# Patient Record
Sex: Male | Born: 1975
Health system: Southern US, Community
[De-identification: ages and names within clinical notes are randomized; demographics above are authoritative.]

## PROBLEM LIST (undated history)

## (undated) DIAGNOSIS — F419 Anxiety disorder, unspecified: Secondary | ICD-10-CM

## (undated) DIAGNOSIS — F319 Bipolar disorder, unspecified: Secondary | ICD-10-CM

## (undated) DIAGNOSIS — E119 Type 2 diabetes mellitus without complications: Secondary | ICD-10-CM

## (undated) DIAGNOSIS — L0390 Cellulitis, unspecified: Secondary | ICD-10-CM

## (undated) DIAGNOSIS — I10 Essential (primary) hypertension: Secondary | ICD-10-CM

## (undated) DIAGNOSIS — E785 Hyperlipidemia, unspecified: Secondary | ICD-10-CM

## (undated) HISTORY — DX: Anxiety disorder, unspecified: F41.9

## (undated) HISTORY — PX: OPEN ANTERIOR SHOULDER RECONSTRUCTION: SHX2100

## (undated) HISTORY — PX: EYE SURGERY: SHX253

## (undated) HISTORY — DX: Essential (primary) hypertension: I10

## (undated) HISTORY — PX: FOOT FRACTURE SURGERY: SHX645

## (undated) HISTORY — DX: Hyperlipidemia, unspecified: E78.5

---
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
## 1998-04-24 ENCOUNTER — Emergency Department (HOSPITAL_COMMUNITY): Admission: EM | Admit: 1998-04-24 | Discharge: 1998-04-24 | Payer: Self-pay | Admitting: Emergency Medicine

## 2002-04-27 ENCOUNTER — Encounter: Payer: Self-pay | Admitting: *Deleted

## 2002-04-27 ENCOUNTER — Emergency Department (HOSPITAL_COMMUNITY): Admission: EM | Admit: 2002-04-27 | Discharge: 2002-04-27 | Payer: Self-pay | Admitting: *Deleted

## 2004-06-16 ENCOUNTER — Ambulatory Visit (HOSPITAL_COMMUNITY): Admission: RE | Admit: 2004-06-16 | Discharge: 2004-06-16 | Payer: Self-pay | Admitting: Family Medicine

## 2005-03-12 ENCOUNTER — Ambulatory Visit (HOSPITAL_COMMUNITY): Admission: RE | Admit: 2005-03-12 | Discharge: 2005-03-12 | Payer: Self-pay | Admitting: Orthopedic Surgery

## 2007-03-28 ENCOUNTER — Emergency Department (HOSPITAL_COMMUNITY): Admission: EM | Admit: 2007-03-28 | Discharge: 2007-03-28 | Payer: Self-pay | Admitting: Emergency Medicine

## 2007-04-02 ENCOUNTER — Emergency Department (HOSPITAL_COMMUNITY): Admission: EM | Admit: 2007-04-02 | Discharge: 2007-04-02 | Payer: Self-pay | Admitting: Emergency Medicine

## 2007-08-06 ENCOUNTER — Encounter: Admission: RE | Admit: 2007-08-06 | Discharge: 2007-08-06 | Payer: Self-pay | Admitting: Orthopedic Surgery

## 2007-08-25 ENCOUNTER — Ambulatory Visit (HOSPITAL_BASED_OUTPATIENT_CLINIC_OR_DEPARTMENT_OTHER): Admission: RE | Admit: 2007-08-25 | Discharge: 2007-08-25 | Payer: Self-pay | Admitting: Orthopedic Surgery

## 2009-04-23 ENCOUNTER — Emergency Department (HOSPITAL_COMMUNITY): Admission: EM | Admit: 2009-04-23 | Discharge: 2009-04-23 | Payer: Self-pay | Admitting: Emergency Medicine

## 2010-04-05 ENCOUNTER — Emergency Department (HOSPITAL_COMMUNITY): Admission: EM | Admit: 2010-04-05 | Discharge: 2010-04-05 | Payer: Self-pay | Admitting: Occupational Therapy

## 2010-11-16 ENCOUNTER — Emergency Department (HOSPITAL_COMMUNITY)
Admission: EM | Admit: 2010-11-16 | Discharge: 2010-11-16 | Disposition: A | Discharge status: Home / Self Care | Payer: 59 | Attending: Emergency Medicine | Admitting: Emergency Medicine

## 2010-11-16 DIAGNOSIS — R599 Enlarged lymph nodes, unspecified: Secondary | ICD-10-CM | POA: Insufficient documentation

## 2010-11-16 DIAGNOSIS — H6690 Otitis media, unspecified, unspecified ear: Secondary | ICD-10-CM | POA: Insufficient documentation

## 2010-12-20 LAB — DIFFERENTIAL
Basophils Relative: 1 % (ref 0–1)
Eosinophils Absolute: 0.3 10*3/uL (ref 0.0–0.7)
Eosinophils Relative: 6 % — ABNORMAL HIGH (ref 0–5)
Neutrophils Relative %: 30 % — ABNORMAL LOW (ref 43–77)

## 2010-12-20 LAB — CBC
HCT: 48.3 % (ref 39.0–52.0)
MCHC: 34.4 g/dL (ref 30.0–36.0)
MCV: 92.7 fL (ref 78.0–100.0)
Platelets: 174 10*3/uL (ref 150–400)

## 2010-12-20 LAB — BASIC METABOLIC PANEL
BUN: 9 mg/dL (ref 6–23)
CO2: 26 mEq/L (ref 19–32)
Chloride: 105 mEq/L (ref 96–112)
Creatinine, Ser: 0.65 mg/dL (ref 0.4–1.5)
Glucose, Bld: 81 mg/dL (ref 70–99)

## 2010-12-20 LAB — D-DIMER, QUANTITATIVE: D-Dimer, Quant: 0.24 ug/mL-FEU (ref 0.00–0.48)

## 2011-01-27 NOTE — Op Note (Signed)
NAMEZYIR, MACRAE           ACCOUNT NO.:  [id number]   MEDICAL RECORD NO.:  [id number]          PATIENT TYPE:  AMB   LOCATION:  [place]                          FACILITY:  [facility]   PHYSICIAN:  Weber Cooks, M.D.     DATE OF BIRTH:  1976-09-04   DATE OF PROCEDURE:  08/25/2007  DATE OF DISCHARGE:                               OPERATIVE REPORT   PREOPERATIVE DIAGNOSIS:  1. Post-traumatic left calcaneal cuboid joint arthritis.  2. Left lateral process talus spur.   POSTOPERATIVE DIAGNOSIS:  1. Post-traumatic left calcaneal cuboid joint arthritis.  2. Left lateral process talus spur.   OPERATION:  1. Left calcaneal cuboid fusion.  2. Left local bone graft.  3. Stress x-rays left foot.  4. Partial excision left talus i.e. lateral process talus excision.   ANESTHESIA:  General.   SURGEON:  Weber Cooks, MD.   ASSISTANT:  Lockie Mola, P.A.   ESTIMATED BLOOD LOSS:  Minimal.   TOURNIQUET TIME:  Approximately an hour and half   COMPLICATIONS:  None.   DISPOSITION:  Stable to PR.   INDICATIONS:  This 35 year old male who sustained a fracture of his  cuboid as well as the lateral process of his talus. Postoperatively, he  had persistent pain and progressive pain to the point where he was  interfering with his life. Despite anti-inflammatories and shoe  modifications, he was consented for the above procedure.  All risks  which include infection. neurovessel injury, nonunion, malunion,  hardware rotation, hardware failure, persistent pain, worsening pain,  prolonged recovery, stiffness, and arthritis of neighboring joints,  specifically subtalar joint and the fact that he needed to abstain from  smoking or else he will develop a nonunion were all explained, questions  were encouraged and answered.   DESCRIPTION OF PROCEDURE:  The patient was brought to the operating  room, placed in supine position. Initially after adequate general  endotracheal anesthesia was administered  with block as well as Ancef 1  gram IV piggyback, the patient was then placed in the sloppy lateral  position with the operative side up on a beanbag.  All bony prominences  were well padded, the left lower extremity was then prepped and draped  in a sterile manner over a proximally placed thigh tourniquet. The limb  was gravity exsanguinated and tourniquet was elevated to 290 mmHg.  Am  Ollier incision was then made over the sinus tarsi.  Dissection was  carried down through the skin, hemostasis was obtained.  EDB was then  elevated off the neck and anterior process of the calcaneus and both the  subtalar joint, sinus tarsi area as well as CC joint were entered.  We  first addressed the lateral process of the talus.  This was actually  loose and had fibrous union. Curved quarter inch osteotome, the lateral  process was removed. The entire lateral gutter was freed up and there  was no impingement with subtalar motion.  This was an adequate amount of  bone and it was chopped up and placed on the back table as local bone  graft. We then entered through the CC joint.  This was highly arthritic.  We then removed the remaining cartilage from the CC joint and placed  multiple 2-mm drill holes on either side of rhe joint as well as fish  scale to either side as well.  This created excellent bed for fusion.  We then obtained a synthetic bone graft that was both osteoinductive and  osteoconductive and wrapped the local bone graft within this graft.  This had BMT in it as well.  This was then packed into the calcaneal  cuboid joint.  We then placed an H plate over the lateral aspect of the  calcaneal cuboid joint and then fixed this with four 2.7 mm fully  threaded cortical set screws using a 2.0-mm drill hole respectively.  This had excellent purchase and compression across the CC joint and this  was done with the screws placed in a compressed manner week.  We then  repaired the EDB muscle and then  released the tourniquet and hemostasis  was obtained.  The subcu was closed with 3-0 Vicryl, skin was closed  with 4-0 nylon. A sterile dressing was applied.  Modified Jones dressing  was applied.  The patient was stable to the PR.      Weber Cooks, M.D.  Electronically Signed     PB/MEDQ  D:  08/25/2007  T:  08/26/2007  Job:  [id number]

## 2011-02-10 ENCOUNTER — Inpatient Hospital Stay (HOSPITAL_COMMUNITY)
Admission: RE | Admit: 2011-02-10 | Discharge: 2011-02-13 | DRG: 885 | Disposition: A | Discharge status: Home / Self Care | Payer: 59 | Source: Ambulatory Visit | Attending: Psychiatry | Admitting: Psychiatry

## 2011-02-10 DIAGNOSIS — F259 Schizoaffective disorder, unspecified: Principal | ICD-10-CM | POA: Diagnosis present

## 2011-02-10 DIAGNOSIS — F17200 Nicotine dependence, unspecified, uncomplicated: Secondary | ICD-10-CM | POA: Diagnosis present

## 2011-02-10 LAB — GLUCOSE, CAPILLARY: Glucose-Capillary: 197 mg/dL — ABNORMAL HIGH (ref 70–99)

## 2011-02-11 DIAGNOSIS — F39 Unspecified mood [affective] disorder: Secondary | ICD-10-CM

## 2011-02-11 LAB — CBC
Hemoglobin: 16.1 g/dL (ref 13.0–17.0)
MCH: 31.1 pg (ref 26.0–34.0)
MCHC: 34.8 g/dL (ref 30.0–36.0)
MCV: 89.2 fL (ref 78.0–100.0)
Platelets: 202 10*3/uL (ref 150–400)

## 2011-02-11 LAB — TSH: TSH: 0.962 u[IU]/mL (ref 0.350–4.500)

## 2011-02-11 LAB — LIPID PANEL
Cholesterol: 182 mg/dL (ref 0–200)
LDL Cholesterol: UNDETERMINED mg/dL (ref 0–99)
Total CHOL/HDL Ratio: 7.6 RATIO

## 2011-02-11 LAB — HEMOGLOBIN A1C
Hgb A1c MFr Bld: 8.3 % — ABNORMAL HIGH (ref <5.7)
Mean Plasma Glucose: 192 mg/dL — ABNORMAL HIGH (ref <117)

## 2011-02-11 LAB — GLUCOSE, CAPILLARY: Glucose-Capillary: 123 mg/dL — ABNORMAL HIGH (ref 70–99)

## 2011-02-11 LAB — T4, FREE: Free T4: 1.4 ng/dL (ref 0.80–1.80)

## 2011-02-11 LAB — BILIRUBIN, DIRECT: Bilirubin, Direct: 0.1 mg/dL (ref 0.0–0.3)

## 2011-02-11 LAB — VALPROIC ACID LEVEL: Valproic Acid Lvl: 42.5 ug/mL — ABNORMAL LOW (ref 50.0–100.0)

## 2011-02-11 LAB — COMPREHENSIVE METABOLIC PANEL
AST: 30 U/L (ref 0–37)
BUN: 10 mg/dL (ref 6–23)
CO2: 28 mEq/L (ref 19–32)
Calcium: 9.4 mg/dL (ref 8.4–10.5)
Chloride: 101 mEq/L (ref 96–112)
Creatinine, Ser: 0.82 mg/dL (ref 0.4–1.5)
GFR calc Af Amer: >60 mL/min (ref >60)
GFR calc non Af Amer: >60 mL/min (ref >60)
Total Bilirubin: 0.3 mg/dL (ref 0.3–1.2)

## 2011-02-12 LAB — GLUCOSE, CAPILLARY
Glucose-Capillary: 110 mg/dL — ABNORMAL HIGH (ref 70–99)
Glucose-Capillary: 111 mg/dL — ABNORMAL HIGH (ref 70–99)

## 2011-02-13 DIAGNOSIS — F39 Unspecified mood [affective] disorder: Secondary | ICD-10-CM

## 2011-02-13 LAB — GLUCOSE, CAPILLARY
Glucose-Capillary: 107 mg/dL — ABNORMAL HIGH (ref 70–99)
Glucose-Capillary: 136 mg/dL — ABNORMAL HIGH (ref 70–99)

## 2011-02-13 NOTE — H&P (Signed)
NAME:  MACSON, HINGER NO.:  [id number]  MEDICAL RECORD NO.:  [id number]           PATIENT TYPE:  I  LOCATION:  0505                          FACILITY:  [facility]  PHYSICIAN:  Carloyn Jaeger, MD     DATE OF BIRTH:  08-Jun-1976  DATE OF ADMISSION:  02/10/2011 DATE OF DISCHARGE:                      PSYCHIATRIC ADMISSION ASSESSMENT   CHIEF COMPLAINT:  "I was having thoughts of harming myself."  HISTORY OF PRESENT ILLNESS:  James [name] is a 35 year old, recently separated white male who states that he was admitted after beginning to experience depressive symptoms and some suicidal thoughts.  He states this occurred after his wife of 17 years decided that living with someone with "a bipolar disorder" is too difficult and asked for a separation.  The patient reports that upon admission, he was having difficulty initiating and maintaining sleep with decreased appetite, moderate feelings of sadness, anhedonia and depressed mood.  He reports, however, that now that he is in the hospital and restarted on his medications, his symptoms are under good control.  Currently, he states he is sleeping well without difficulty and reports a good appetite and rates his depression as a "1" on a scale from 1-10 with 1 being no depression.  He adamantly denies any suicidal or homicidal ideations.  The patient also states that prior to admission, he was experiencing some auditory hallucinations but states that these have also resolved. He reports that usually the voices sound to him as if  "people are talking in the background."  Again, he denies any auditory or visual hallucinations or delusional thinking today.  The patient reports smoking cigarettes but denies any use of alcohol or illicit drugs.  The patient states that he does feel ready for discharge but would prefer to remain in the hospital one more day in order to "get his followup appointment straight."  PAST PSYCHIATRIC  HISTORY:  The patient reports being hospitalized at the [facility] for 6 days in 2011.  He denies any other psychiatric hospitalizations.  He also states that he cannot recall the names of other psychiatric medications that he has tried.  PAST MEDICAL HISTORY:  Current medications: 1. Saphris 10 mg p.o. b.i.d. 2. Cogentin 1 mg p.o. b.i.d. 3. Vitamin D 1000 units p.o. daily. 4. Valium 5 mg p.o. b.i.d. 5. Depakote 1000 mg p.o. b.i.d. 6. Fenofibrate 160 mg p.o. daily. 7. NovoLog sliding scale t.i.d. 8. Lamictal 150 mg p.o. b.i.d. 9. Metformin 500 mg p.o. b.i.d.   Allergies:  NKDA.  Medical illnesses:   1. Diabetes mellitus.  Past operations: 1. Right shoulder repair. 2. Surgical repair of left foot.  FAMILY HISTORY:  The patient reports that his maternal grandmother has schizophrenia.  He also states that she likes to "drink alcohol."  He also states that his father has a history of alcohol abuse.  He denies any other family history of psychiatric or substance abuse-related illnesses.  SOCIAL HISTORY:  The patient reports that he was born and raised in [place], [place], and currently lives in [place] with his mother.  He has been married on one occasion for 17 years and has three children, a daughter  62 years of age, a son 20 years of age and a daughter 3 years of age who are all in good health.  The patient states that he is unemployed and receives disability.  As stated in the HPI, he reports recently separating from his wife 3 weeks ago.  He reports smoking 1-2 packs of cigarettes per day but denies any use of alcohol or illicit drugs.  MENTAL STATUS EXAM:  GENERAL - The patient was alert and oriented x3 and was friendly and cooperative throughout the evaluation.  Speech was appropriate in terms of rate and volume.  Mood appeared essentially euthymic.  Affect was slightly constricted. THOUGHTS - The patient denied any obvious delusions or  hallucinations today, nor did he report any suicidal or homicidal ideations.  Judgment and insight today both appeared good.  IMPRESSION:   Axis I: 1. Schizoaffective disorder - Bipolar type - Under good control. 2. Nicotine dependence. Axis II:  Deferred. Axis III:  Please see past medical history above. Axis IV: 1. Recent separation from spouse. 2. Limited primary support system. 3. Unemployment. 4. Serious chronic psychiatric illness. Axis V:  Global Assessment of Functioning at time of admission approximately 40.  Highest Global Assessment of Functioning in past year approximately 92.  PLAN: 1. The patient was continued on all of his psychiatric medications as     prescribed. 2. The patient will continue to be monitored for danger to self and/or     others. 3. The patient will participate in group and unit activities per     routine.    __________________________________ Carloyn Jaeger, MD     RR/MEDQ  D:  02/12/2011  T:  02/12/2011  Job:  [id number]  Electronically Signed by Carloyn Jaeger MD on 02/13/2011 10:04:28 PM

## 2011-02-25 NOTE — Discharge Summary (Signed)
  James [name], James [name]           ACCOUNT NO.:  [id number]  MEDICAL RECORD NO.:  [id number]  LOCATION:                                 FACILITY:  PHYSICIAN:  Carloyn Jaeger, MD     DATE OF BIRTH:  12/13/1975  DATE OF ADMISSION:  02/09/2011 DATE OF DISCHARGE:  02/13/2011                              DISCHARGE SUMMARY   REASON FOR ADMISSION:  The patient was having thoughts of harming himself.  This is a 35 year old male that presented with depressive symptoms and suicidal thoughts.  He was having increasing symptoms after his wife of 17 years asked for a separation.  FINAL IMPRESSION:  AXIS I:  Schizoaffective disorder bipolar type under good control.  Nicotine dependence. AXIS II:  Deferred. AXIS III: History of diabetes mellitus and a past surgery on a right shoulder repair. AXIS IV: Recent separation from spouse, limited primary support system, unemployment, serious chronic psychiatric illness. AXIS V: 55.  PERTINENT LABORATORY DATA:  TSH of 2.758, folate greater than 24.  C-MET within normal limits.  Cholesterol elevated 468, triglycerides elevated at 5650.  PERTINENT FINDINGS:  This is a fully alert and oriented male, friendly, cooperative, admitted to the adult milieu.  The patient was continued on all of his psychiatric medications as prescribed.  We continued to monitor the patient's mood and affect for signs of dangerousness to self and others.  We had contact with the patient's mother to assess safety concerns and to provide information.  No weapons were in the home.  The patient was active in attending groups.  He was improving with sleep, good, appetite good.  His depression was under good control.  Denied any suicidal or homicidal thoughts or auditory hallucinations.  On day of discharge, The patient was anxious to go home.  His sleep was good. Appetite good.  Depression rating it a 1 on a scale of 1-10.  Denied any suicidal or homicidal thoughts, auditory or  visual hallucinations. Anxiety was under good control.  The patient was stable for discharge.  DISCHARGE MEDICATIONS: 1. Saphris 5 mg taking 2 sublingual daily. 2. Cogentin 1 mg b.i.d. 3. Valium 5 mg b.i.d. 4. Depakote ER 500 mg taking 2 b.i.d. 5. Metformin 500 mg b.i.d. with meals. 6. Lamictal 150 mg b.i.d. 7. Tricor 1 daily. 8. Vitamin D2 weekly.  FOLLOWUP:  His follow-up appointment was with Dr. Toy Care on Wednesday 02/18/2011 at 6:15 p.m., phone number [phone].     Redgie Grayer, N.P.   ______________________________ Carloyn Jaeger, MD    JO/MEDQ  D:  02/18/2011  T:  02/18/2011  Job:  [id number]  Electronically Signed by Arnetha CourserP. on 02/25/2011 01:01:00 PM Electronically Signed by Carloyn Jaeger MD on 02/25/2011 04:51:15 PM

## 2011-05-16 ENCOUNTER — Emergency Department (HOSPITAL_BASED_OUTPATIENT_CLINIC_OR_DEPARTMENT_OTHER)
Admission: EM | Admit: 2011-05-16 | Discharge: 2011-05-16 | Disposition: A | Discharge status: Home / Self Care | Payer: 59 | Attending: Emergency Medicine | Admitting: Emergency Medicine

## 2011-05-16 ENCOUNTER — Encounter: Payer: Self-pay | Admitting: Emergency Medicine

## 2011-05-16 DIAGNOSIS — K0381 Cracked tooth: Secondary | ICD-10-CM | POA: Insufficient documentation

## 2011-05-16 DIAGNOSIS — F17200 Nicotine dependence, unspecified, uncomplicated: Secondary | ICD-10-CM | POA: Insufficient documentation

## 2011-05-16 DIAGNOSIS — K089 Disorder of teeth and supporting structures, unspecified: Secondary | ICD-10-CM | POA: Insufficient documentation

## 2011-05-16 DIAGNOSIS — E119 Type 2 diabetes mellitus without complications: Secondary | ICD-10-CM | POA: Insufficient documentation

## 2011-05-16 DIAGNOSIS — K029 Dental caries, unspecified: Secondary | ICD-10-CM | POA: Insufficient documentation

## 2011-05-16 DIAGNOSIS — F319 Bipolar disorder, unspecified: Secondary | ICD-10-CM | POA: Insufficient documentation

## 2011-05-16 DIAGNOSIS — K0889 Other specified disorders of teeth and supporting structures: Secondary | ICD-10-CM

## 2011-05-16 HISTORY — DX: Bipolar disorder, unspecified: F31.9

## 2011-05-16 MED ORDER — PENICILLIN V POTASSIUM 250 MG PO TABS: 250.0000 mg | ORAL_TABLET | Freq: Four times a day (QID) | ORAL | Status: AC

## 2011-05-16 NOTE — ED Notes (Signed)
Pt presents with R lower jaw pain and facial swelling

## 2011-05-16 NOTE — ED Provider Notes (Signed)
History     CSN: [id number] Arrival date & time: 05/16/2011  6:22 PM  Chief Complaint  Patient presents with  . Dental Pain    broken R lower tooth with 10/10 pain   HPI Comments: Pt state that he broke on of his right lower teeth and now he is having a lot of pain in the area  Patient is a 35 y.o. male presenting with tooth pain. The history is provided by the patient. No language interpreter was used.  Dental PainThe primary symptoms include mouth pain and dental injury. Primary symptoms do not include oral bleeding, oral lesions, headaches or fever. The symptoms began 12 to 24 hours ago. The symptoms are unchanged. The symptoms are new.  Additional symptoms include: gum tenderness. Additional symptoms do not include: ear pain.    Past Medical History  Diagnosis Date  . Bipolar 1 disorder   . Diabetes mellitus     Past Surgical History  Procedure Date  . Foot surgery   . Open anterior shoulder reconstruction     History reviewed. No pertinent family history.  History  Substance Use Topics  . Smoking status: Current Some Day Smoker -- 1.0 packs/day  . Smokeless tobacco: Not on file  . Alcohol Use:       Review of Systems  Constitutional: Negative for fever.  HENT: Negative for ear pain.   Respiratory: Negative.   Cardiovascular: Negative.   Neurological: Negative for headaches.    Physical Exam  BP 155/100  Pulse 88  Temp(Src) 98.8 F (37.1 C) (Oral)  Resp 22  SpO2 98%  Physical Exam  Nursing note and vitals reviewed. Constitutional: He appears well-developed and well-nourished.  HENT:  Mouth/Throat: Dental caries present.    Cardiovascular: Normal rate and regular rhythm.   Pulmonary/Chest: Effort normal and breath sounds normal.    ED Course  Procedures  MDM Will treat for possible infection     Glendell Docker, NP 05/16/11 1856

## 2011-05-16 NOTE — ED Provider Notes (Signed)
Medical screening examination/treatment/procedure(s) were performed by non-physician practitioner and as supervising physician I was immediately available for consultation/collaboration.   Veryl Speak, MD 05/16/11 2016

## 2011-06-22 LAB — POCT HEMOGLOBIN-HEMACUE
Hemoglobin: 16.2
Operator id: 112821

## 2011-06-29 LAB — I-STAT 8, (EC8 V) (CONVERTED LAB)
Bicarbonate: 22.8
Glucose, Bld: 112 — ABNORMAL HIGH
Hemoglobin: 15
Operator id: 196461
Sodium: 138
TCO2: 24
pCO2, Ven: 43 — ABNORMAL LOW

## 2011-06-29 LAB — URINALYSIS, ROUTINE W REFLEX MICROSCOPIC
Bilirubin Urine: NEGATIVE
Nitrite: NEGATIVE
Protein, ur: NEGATIVE
Specific Gravity, Urine: 1.031 — ABNORMAL HIGH
Urobilinogen, UA: 0.2

## 2011-06-29 LAB — DIFFERENTIAL
Basophils Absolute: 0
Basophils Relative: 0
Eosinophils Absolute: 0.1
Neutro Abs: 4.6
Neutrophils Relative %: 65

## 2011-06-29 LAB — CBC
MCHC: 34.3
Platelets: 231
RDW: 12.7

## 2011-06-29 LAB — POCT I-STAT CREATININE: Creatinine, Ser: 0.7

## 2011-06-30 LAB — COMPREHENSIVE METABOLIC PANEL WITH GFR
ALT: 32
AST: 31
Albumin: 3.7
Alkaline Phosphatase: 46
BUN: 12
CO2: 23
Calcium: 8.6
Chloride: 104
Creatinine, Ser: 0.85
GFR calc non Af Amer: >60
Glucose, Bld: 168 — ABNORMAL HIGH
Potassium: 3.8
Sodium: 138
Total Bilirubin: 0.6
Total Protein: 6.7

## 2011-06-30 LAB — URINALYSIS, ROUTINE W REFLEX MICROSCOPIC
Bilirubin Urine: NEGATIVE
Glucose, UA: 100 — AB
Hgb urine dipstick: NEGATIVE
Ketones, ur: NEGATIVE
Nitrite: NEGATIVE
Protein, ur: NEGATIVE
Specific Gravity, Urine: 1.019
Urobilinogen, UA: 0.2
pH: 8

## 2011-06-30 LAB — DIFFERENTIAL
Basophils Absolute: 0
Basophils Relative: 1
Lymphocytes Relative: 47 — ABNORMAL HIGH
Monocytes Absolute: 0.6
Neutro Abs: 2.4
Neutrophils Relative %: 41 — ABNORMAL LOW

## 2011-06-30 LAB — CBC
HCT: 39.9
Hemoglobin: 13.8
MCV: 90.2
Platelets: 261
WBC: 5.9

## 2011-06-30 LAB — RAPID URINE DRUG SCREEN, HOSP PERFORMED
Amphetamines: NOT DETECTED
Barbiturates: NOT DETECTED
Opiates: NOT DETECTED
Tetrahydrocannabinol: POSITIVE — AB

## 2011-06-30 LAB — CARBAMAZEPINE LEVEL, TOTAL: Carbamazepine Lvl: 16.4

## 2012-03-16 ENCOUNTER — Emergency Department (HOSPITAL_COMMUNITY)
Admission: EM | Admit: 2012-03-16 | Discharge: 2012-03-16 | Disposition: A | Discharge status: Home / Self Care | Payer: 59 | Attending: Emergency Medicine | Admitting: Emergency Medicine

## 2012-03-16 ENCOUNTER — Encounter (HOSPITAL_COMMUNITY): Payer: Self-pay | Admitting: Emergency Medicine

## 2012-03-16 DIAGNOSIS — R21 Rash and other nonspecific skin eruption: Secondary | ICD-10-CM | POA: Insufficient documentation

## 2012-03-16 DIAGNOSIS — E7800 Pure hypercholesterolemia, unspecified: Secondary | ICD-10-CM | POA: Insufficient documentation

## 2012-03-16 DIAGNOSIS — F17200 Nicotine dependence, unspecified, uncomplicated: Secondary | ICD-10-CM | POA: Insufficient documentation

## 2012-03-16 DIAGNOSIS — Z880 Allergy status to penicillin: Secondary | ICD-10-CM | POA: Insufficient documentation

## 2012-03-16 DIAGNOSIS — E119 Type 2 diabetes mellitus without complications: Secondary | ICD-10-CM | POA: Insufficient documentation

## 2012-03-16 DIAGNOSIS — Z881 Allergy status to other antibiotic agents status: Secondary | ICD-10-CM | POA: Insufficient documentation

## 2012-03-16 DIAGNOSIS — F319 Bipolar disorder, unspecified: Secondary | ICD-10-CM | POA: Insufficient documentation

## 2012-03-16 DIAGNOSIS — W57XXXA Bitten or stung by nonvenomous insect and other nonvenomous arthropods, initial encounter: Secondary | ICD-10-CM

## 2012-03-16 DIAGNOSIS — I10 Essential (primary) hypertension: Secondary | ICD-10-CM | POA: Insufficient documentation

## 2012-03-16 LAB — RAPID URINE DRUG SCREEN, HOSP PERFORMED
Amphetamines: NOT DETECTED
Barbiturates: NOT DETECTED
Benzodiazepines: POSITIVE — AB
Cocaine: NOT DETECTED

## 2012-03-16 LAB — CBC
HCT: 48.9 % (ref 39.0–52.0)
Hemoglobin: 18.4 g/dL — ABNORMAL HIGH (ref 13.0–17.0)
RBC: 5.48 MIL/uL (ref 4.22–5.81)

## 2012-03-16 LAB — ETHANOL: Alcohol, Ethyl (B): <11 mg/dL (ref 0–11)

## 2012-03-16 LAB — COMPREHENSIVE METABOLIC PANEL
CO2: 23 mEq/L (ref 19–32)
Calcium: 9.5 mg/dL (ref 8.4–10.5)
Creatinine, Ser: 0.63 mg/dL (ref 0.50–1.35)
GFR calc Af Amer: >90 mL/min (ref >90)
GFR calc non Af Amer: >90 mL/min (ref >90)
Glucose, Bld: 119 mg/dL — ABNORMAL HIGH (ref 70–99)
Total Protein: 7 g/dL (ref 6.0–8.3)

## 2012-03-16 MED ORDER — HYDROCORTISONE 1 % EX CREA: TOPICAL_CREAM | CUTANEOUS | Status: DC

## 2012-03-16 NOTE — ED Notes (Signed)
Pt is from [facility]. Pt is here for medical clearance. Pt need labs, has rash on leg, and elevated bp

## 2012-03-16 NOTE — ED Provider Notes (Signed)
History     CSN: [id number]  Arrival date & time 03/16/12  [id number]   First MD Initiated Contact with Patient 03/16/12 [phone]     HPI Pt presents to the ED for medical clearance.  He has history of mental health problems on medications.  Pt is now hearing voices that are commanding him to kill his wife.  PT sought treatment to help him feel safe.   No suicidal ideation.  Medically pt has no complaints.  He does have history of htn and hypercholesterolemia.  Has not been taking medications for that because he cannot afford it. He has had a rash on this left knee for years.  It is mildly itchy.  He also has numerous bug bites on his legs.  PT states the symptoms are mild.  No fever or drainage. Past Medical History  Diagnosis Date  . Bipolar 1 disorder   . Diabetes mellitus     Past Surgical History  Procedure Date  . Foot surgery   . Open anterior shoulder reconstruction     No family history on file.  History  Substance Use Topics  . Smoking status: Current Some Day Smoker -- 1.0 packs/day  . Smokeless tobacco: Not on file  . Alcohol Use:       Review of Systems  All other systems reviewed and are negative.    Allergies  Amoxicillin and Penicillins  Home Medications   Current Outpatient Rx  Name Route Sig Dispense Refill  . DIVALPROEX SODIUM ER 500 MG PO TB24 Oral Take 1,000 mg by mouth 2 (two) times daily.      Marland Kitchen SAPHRIS SL Sublingual Place 1 tablet under the tongue daily.      Marland Kitchen BENZTROPINE MESYLATE 1 MG PO TABS Oral Take 1-2 mg by mouth 2 (two) times daily. Take 1 tab in the morning and 2 tabs in the evening     . VITAMIN D 1000 UNITS PO CAPS Oral Take 1,000 Units by mouth daily.      Marland Kitchen METFORMIN HCL PO Oral Take 1 tablet by mouth 2 (two) times daily.      Marland Kitchen PRESCRIPTION MEDICATION Oral Take 1 tablet by mouth daily. Cholesterol medication     . PRESCRIPTION MEDICATION Oral Take 5 mg by mouth daily. Antipsychotic       BP 130/81  Pulse 79  Temp 97.8 F (36.6 C)  (Oral)  Resp 16  SpO2 97%  Physical Exam  Nursing note and vitals reviewed. Constitutional: He appears well-developed and well-nourished. No distress.  HENT:  Head: Normocephalic and atraumatic.  Right Ear: External ear normal.  Left Ear: External ear normal.  Eyes: Conjunctivae are normal. Right eye exhibits no discharge. Left eye exhibits no discharge. No scleral icterus.  Neck: Neck supple. No tracheal deviation present.  Cardiovascular: Normal rate, regular rhythm and intact distal pulses.   Pulmonary/Chest: Effort normal and breath sounds normal. No stridor. No respiratory distress. He has no wheezes. He has no rales.  Abdominal: Soft. Bowel sounds are normal. He exhibits no distension. There is no tenderness. There is no rebound and no guarding.  Musculoskeletal: He exhibits no edema and no tenderness.  Neurological: He is alert. He has normal strength. No sensory deficit. Cranial nerve deficit:  no gross defecits noted. He exhibits normal muscle tone. He displays no seizure activity. Coordination normal.  Skin: Skin is warm and dry. Rash noted.       Macular papular rash on left knee, no pustules  Psychiatric:  He has a normal mood and affect.    ED Course  Procedures (including critical care time)  Labs Reviewed  URINE RAPID DRUG SCREEN (HOSP PERFORMED) - Abnormal; Notable for the following:    Benzodiazepines POSITIVE (*)     All other components within normal limits  COMPREHENSIVE METABOLIC PANEL - Abnormal; Notable for the following:    Glucose, Bld 119 (*)     All other components within normal limits  CBC - Abnormal; Notable for the following:    Hemoglobin 18.4 (*)     MCHC 36.2 (*)  CORRECTED FOR INTERFERING SUBSTANCE   All other components within normal limits  ETHANOL   No results found.    MDM  Pt has history of HTN.  BP only mildly elevated here.  No specific treatment needed.  Follow up with PCP routinely.  Pt has history of hypercholesterolemia.  Safe  to follow up with a PCP.  Cholesterol screening not typically done in the ED.  Regarding his rash, it has been ongoing for years.  May be related to eczema.  Also has insect bites but no sign of erythema marginatum or acute infection.  Medically Stable for psychiatric treatment.       Kathalene Frames, MD 03/16/12 1213

## 2012-03-16 NOTE — ED Notes (Signed)
Pt here from [facility] and being seen there for Hallucinations. He was sent here to have rash looked at that is on his left knee. Also [facility] has asked for basic labs done. Dr Tomi Bamberger is with patient looking at rash.

## 2012-08-24 ENCOUNTER — Emergency Department (HOSPITAL_BASED_OUTPATIENT_CLINIC_OR_DEPARTMENT_OTHER): Payer: 59

## 2012-08-24 ENCOUNTER — Encounter (HOSPITAL_BASED_OUTPATIENT_CLINIC_OR_DEPARTMENT_OTHER): Payer: Self-pay | Admitting: *Deleted

## 2012-08-24 ENCOUNTER — Inpatient Hospital Stay (HOSPITAL_BASED_OUTPATIENT_CLINIC_OR_DEPARTMENT_OTHER)
Admission: EM | Admit: 2012-08-24 | Discharge: 2012-08-28 | DRG: 638 | Disposition: A | Discharge status: Home / Self Care | Payer: 59 | Attending: Internal Medicine | Admitting: Internal Medicine

## 2012-08-24 DIAGNOSIS — D72829 Elevated white blood cell count, unspecified: Secondary | ICD-10-CM | POA: Diagnosis present

## 2012-08-24 DIAGNOSIS — E119 Type 2 diabetes mellitus without complications: Secondary | ICD-10-CM

## 2012-08-24 DIAGNOSIS — IMO0002 Reserved for concepts with insufficient information to code with codable children: Principal | ICD-10-CM | POA: Diagnosis present

## 2012-08-24 DIAGNOSIS — Z6832 Body mass index (BMI) 32.0-32.9, adult: Secondary | ICD-10-CM

## 2012-08-24 DIAGNOSIS — F319 Bipolar disorder, unspecified: Secondary | ICD-10-CM | POA: Diagnosis present

## 2012-08-24 DIAGNOSIS — E1169 Type 2 diabetes mellitus with other specified complication: Principal | ICD-10-CM | POA: Diagnosis present

## 2012-08-24 DIAGNOSIS — E669 Obesity, unspecified: Secondary | ICD-10-CM | POA: Diagnosis present

## 2012-08-24 DIAGNOSIS — Z79899 Other long term (current) drug therapy: Secondary | ICD-10-CM

## 2012-08-24 DIAGNOSIS — R509 Fever, unspecified: Secondary | ICD-10-CM

## 2012-08-24 DIAGNOSIS — N390 Urinary tract infection, site not specified: Secondary | ICD-10-CM

## 2012-08-24 DIAGNOSIS — L02419 Cutaneous abscess of limb, unspecified: Secondary | ICD-10-CM | POA: Diagnosis present

## 2012-08-24 DIAGNOSIS — F17200 Nicotine dependence, unspecified, uncomplicated: Secondary | ICD-10-CM | POA: Diagnosis present

## 2012-08-24 DIAGNOSIS — L0390 Cellulitis, unspecified: Secondary | ICD-10-CM

## 2012-08-24 HISTORY — DX: Cellulitis, unspecified: L03.90

## 2012-08-24 HISTORY — DX: Type 2 diabetes mellitus without complications: E11.9

## 2012-08-24 LAB — URINE MICROSCOPIC-ADD ON

## 2012-08-24 LAB — COMPREHENSIVE METABOLIC PANEL
AST: 16 U/L (ref 0–37)
BUN: 19 mg/dL (ref 6–23)
CO2: 24 mEq/L (ref 19–32)
Calcium: 8.9 mg/dL (ref 8.4–10.5)
Creatinine, Ser: 1.2 mg/dL (ref 0.50–1.35)
GFR calc non Af Amer: 75 mL/min — ABNORMAL LOW (ref >90)

## 2012-08-24 LAB — CBC WITH DIFFERENTIAL/PLATELET
Basophils Absolute: 0 10*3/uL (ref 0.0–0.1)
Basophils Relative: 0 % (ref 0–1)
Eosinophils Relative: 0 % (ref 0–5)
HCT: 42.1 % (ref 39.0–52.0)
Lymphocytes Relative: 6 % — ABNORMAL LOW (ref 12–46)
MCHC: 36.6 g/dL — ABNORMAL HIGH (ref 30.0–36.0)
MCV: 87.2 fL (ref 78.0–100.0)
Monocytes Absolute: 1.1 10*3/uL — ABNORMAL HIGH (ref 0.1–1.0)
Monocytes Relative: 10 % (ref 3–12)
RDW: 12.7 % (ref 11.5–15.5)

## 2012-08-24 LAB — CREATININE, SERUM
Creatinine, Ser: 1.27 mg/dL (ref 0.50–1.35)
GFR calc Af Amer: 81 mL/min — ABNORMAL LOW (ref >90)

## 2012-08-24 LAB — VALPROIC ACID LEVEL: Valproic Acid Lvl: 73.4 ug/mL (ref 50.0–100.0)

## 2012-08-24 LAB — URINALYSIS, ROUTINE W REFLEX MICROSCOPIC
Nitrite: POSITIVE — AB
Specific Gravity, Urine: 1.034 — ABNORMAL HIGH (ref 1.005–1.030)
Urobilinogen, UA: 1 mg/dL (ref 0.0–1.0)
pH: 5 (ref 5.0–8.0)

## 2012-08-24 LAB — CBC
Hemoglobin: 15.3 g/dL (ref 13.0–17.0)
MCH: 32.2 pg (ref 26.0–34.0)
MCV: 89.3 fL (ref 78.0–100.0)
Platelets: 132 10*3/uL — ABNORMAL LOW (ref 150–400)
RBC: 4.75 MIL/uL (ref 4.22–5.81)
WBC: 9.9 10*3/uL (ref 4.0–10.5)

## 2012-08-24 LAB — GLUCOSE, CAPILLARY: Glucose-Capillary: 106 mg/dL — ABNORMAL HIGH (ref 70–99)

## 2012-08-24 MED ORDER — VANCOMYCIN HCL IN DEXTROSE 1-5 GM/200ML-% IV SOLN
1000.0000 mg | Freq: Once | INTRAVENOUS | Status: AC
  Administered 2012-08-24: 1000 mg via INTRAVENOUS
  Filled 2012-08-24: qty 200

## 2012-08-24 MED ORDER — HYDROMORPHONE HCL PF 1 MG/ML IJ SOLN
1.0000 mg | INTRAMUSCULAR | Status: DC | PRN
  Administered 2012-08-25 (×2): 1 mg via INTRAVENOUS
  Filled 2012-08-24 (×2): qty 1

## 2012-08-24 MED ORDER — ENOXAPARIN SODIUM 40 MG/0.4ML ~~LOC~~ SOLN
40.0000 mg | SUBCUTANEOUS | Status: DC
  Administered 2012-08-25 – 2012-08-28 (×4): 40 mg via SUBCUTANEOUS
  Filled 2012-08-24 (×5): qty 0.4

## 2012-08-24 MED ORDER — BENZTROPINE MESYLATE 2 MG PO TABS
2.0000 mg | ORAL_TABLET | Freq: Every day | ORAL | Status: DC
  Administered 2012-08-24 – 2012-08-27 (×4): 2 mg via ORAL
  Filled 2012-08-24 (×5): qty 1

## 2012-08-24 MED ORDER — VANCOMYCIN HCL IN DEXTROSE 1-5 GM/200ML-% IV SOLN
1000.0000 mg | Freq: Two times a day (BID) | INTRAVENOUS | Status: DC
  Administered 2012-08-25 – 2012-08-26 (×4): 1000 mg via INTRAVENOUS
  Filled 2012-08-24 (×4): qty 200

## 2012-08-24 MED ORDER — HYDROMORPHONE HCL PF 1 MG/ML IJ SOLN
INTRAMUSCULAR | Status: AC
  Filled 2012-08-24: qty 1

## 2012-08-24 MED ORDER — ONDANSETRON HCL 4 MG/2ML IJ SOLN
4.0000 mg | Freq: Once | INTRAMUSCULAR | Status: AC
  Administered 2012-08-24: 4 mg via INTRAVENOUS
  Filled 2012-08-24: qty 2

## 2012-08-24 MED ORDER — HYDROMORPHONE HCL PF 1 MG/ML IJ SOLN: 1.0000 mg | INTRAMUSCULAR | Status: DC | PRN

## 2012-08-24 MED ORDER — ACETAMINOPHEN 325 MG PO TABS
650.0000 mg | ORAL_TABLET | Freq: Once | ORAL | Status: AC
  Administered 2012-08-24: 650 mg via ORAL
  Filled 2012-08-24: qty 2

## 2012-08-24 MED ORDER — SODIUM CHLORIDE 0.9 % IV SOLN: INTRAVENOUS | Status: AC

## 2012-08-24 MED ORDER — INSULIN ASPART 100 UNIT/ML ~~LOC~~ SOLN: 0.0000 [IU] | Freq: Every day | SUBCUTANEOUS | Status: DC

## 2012-08-24 MED ORDER — ONDANSETRON HCL 4 MG PO TABS: 4.0000 mg | ORAL_TABLET | Freq: Four times a day (QID) | ORAL | Status: DC | PRN

## 2012-08-24 MED ORDER — ONDANSETRON HCL 4 MG/2ML IJ SOLN: 4.0000 mg | Freq: Three times a day (TID) | INTRAMUSCULAR | Status: DC | PRN

## 2012-08-24 MED ORDER — RISPERIDONE 2 MG PO TABS
4.0000 mg | ORAL_TABLET | Freq: Every day | ORAL | Status: DC
  Administered 2012-08-24 – 2012-08-27 (×4): 4 mg via ORAL
  Filled 2012-08-24 (×5): qty 2

## 2012-08-24 MED ORDER — HYDROXYZINE PAMOATE 50 MG PO CAPS
50.0000 mg | ORAL_CAPSULE | Freq: Three times a day (TID) | ORAL | Status: DC
  Administered 2012-08-24: 50 mg via ORAL
  Filled 2012-08-24 (×4): qty 1

## 2012-08-24 MED ORDER — HYDROMORPHONE HCL PF 1 MG/ML IJ SOLN
1.0000 mg | Freq: Once | INTRAMUSCULAR | Status: AC
  Administered 2012-08-24: 1 mg via INTRAVENOUS

## 2012-08-24 MED ORDER — MORPHINE SULFATE 4 MG/ML IJ SOLN
4.0000 mg | Freq: Once | INTRAMUSCULAR | Status: AC
  Administered 2012-08-24: 4 mg via INTRAVENOUS
  Filled 2012-08-24: qty 1

## 2012-08-24 MED ORDER — BENZTROPINE MESYLATE 1 MG PO TABS
1.0000 mg | ORAL_TABLET | Freq: Every day | ORAL | Status: DC
  Administered 2012-08-25 – 2012-08-28 (×4): 1 mg via ORAL
  Filled 2012-08-24 (×4): qty 1

## 2012-08-24 MED ORDER — ONDANSETRON HCL 4 MG/2ML IJ SOLN: 4.0000 mg | Freq: Four times a day (QID) | INTRAMUSCULAR | Status: DC | PRN

## 2012-08-24 MED ORDER — DEXTROSE 5 % IV SOLN
1.0000 g | Freq: Two times a day (BID) | INTRAVENOUS | Status: DC
  Administered 2012-08-24 – 2012-08-25 (×4): 1 g via INTRAVENOUS
  Filled 2012-08-24 (×5): qty 1

## 2012-08-24 MED ORDER — METFORMIN HCL 500 MG PO TABS
500.0000 mg | ORAL_TABLET | Freq: Two times a day (BID) | ORAL | Status: DC
  Administered 2012-08-25 – 2012-08-28 (×7): 500 mg via ORAL
  Filled 2012-08-24 (×9): qty 1

## 2012-08-24 MED ORDER — DIVALPROEX SODIUM ER 500 MG PO TB24
1000.0000 mg | ORAL_TABLET | Freq: Two times a day (BID) | ORAL | Status: DC
  Administered 2012-08-24 – 2012-08-28 (×8): 1000 mg via ORAL
  Filled 2012-08-24 (×9): qty 2

## 2012-08-24 MED ORDER — CEFEPIME HCL 1 G IJ SOLR
INTRAMUSCULAR | Status: AC
  Filled 2012-08-24: qty 1

## 2012-08-24 MED ORDER — DEXTROSE 5 % IV SOLN: 1.0000 g | Freq: Two times a day (BID) | INTRAVENOUS | Status: DC

## 2012-08-24 MED ORDER — INSULIN ASPART 100 UNIT/ML ~~LOC~~ SOLN: 0.0000 [IU] | Freq: Three times a day (TID) | SUBCUTANEOUS | Status: DC

## 2012-08-24 MED ORDER — VANCOMYCIN HCL 10 G IV SOLR
1500.0000 mg | Freq: Once | INTRAVENOUS | Status: AC
  Administered 2012-08-24: 1500 mg via INTRAVENOUS
  Filled 2012-08-24: qty 1500

## 2012-08-24 MED ORDER — RISPERIDONE 2 MG PO TABS: 4.0000 mg | ORAL_TABLET | Freq: Every day | ORAL | Status: DC

## 2012-08-24 NOTE — ED Provider Notes (Signed)
History     CSN: [id number]  Arrival date & time 08/24/12  1426   First MD Initiated Contact with Patient 08/24/12 1458      Chief Complaint  Patient presents with  . Rash    (Consider location/radiation/quality/duration/timing/severity/associated sxs/prior treatment) HPI Comments: Painful red rash to R leg and arm for the past day.  Started yesterday in upper medial thigh and progressed down leg.  Also area of blotchy redness and pain to R upper arm.  Chills, but denies fever.  Denies tick exposure, camping, injury, cuts.  No chest pain or SOB.  Has nausea.  Denies new medications, foods, products.  The history is provided by the patient and a relative.    Past Medical History  Diagnosis Date  . Bipolar 1 disorder   . Diabetes mellitus     Past Surgical History  Procedure Date  . Foot surgery   . Open anterior shoulder reconstruction     History reviewed. No pertinent family history.  History  Substance Use Topics  . Smoking status: Current Some Day Smoker -- 1.0 packs/day  . Smokeless tobacco: Not on file  . Alcohol Use: No      Review of Systems  Constitutional: Positive for activity change and appetite change. Negative for fever.  HENT: Negative for congestion and rhinorrhea.   Respiratory: Negative for cough, chest tightness and shortness of breath.   Cardiovascular: Negative for chest pain.  Gastrointestinal: Negative for vomiting and abdominal pain.  Genitourinary: Negative for dysuria and penile pain.  Skin: Positive for rash.  Neurological: Positive for weakness and light-headedness. Negative for dizziness and headaches.    Allergies  Amoxicillin and Penicillins  Home Medications   Current Outpatient Rx  Name  Route  Sig  Dispense  Refill  . BENZTROPINE MESYLATE 1 MG PO TABS   Oral   Take 1-2 mg by mouth 2 (two) times daily. Take 1 tab in the morning and 2 tabs in the evening          . DIVALPROEX SODIUM ER 500 MG PO TB24   Oral   Take  1,000 mg by mouth 2 (two) times daily.           Marland Kitchen HYDROCORTISONE 1 % EX CREA      Apply to affected area 2 times daily   15 g   0   . HYDROXYZINE PAMOATE 50 MG PO CAPS   Oral   Take 50 mg by mouth 3 (three) times daily.         Marland Kitchen METFORMIN HCL PO   Oral   Take 1,000 mg by mouth every morning.          Marland Kitchen RISPERIDONE 4 MG PO TABS   Oral   Take 4 mg by mouth at bedtime.           BP 127/73  Pulse 85  Temp 101 F (38.3 C) (Oral)  Resp 20  Ht 6\' 3"  (1.905 m)  Wt 270 lb (122.471 kg)  BMI 33.75 kg/m2  SpO2 95%  Physical Exam  Constitutional: He is oriented to person, place, and time. He appears well-developed and well-nourished. No distress.  HENT:  Head: Normocephalic and atraumatic.  Mouth/Throat: Oropharynx is clear and moist. No oropharyngeal exudate.  Eyes: Conjunctivae normal and EOM are normal. Pupils are equal, round, and reactive to light.  Neck: Normal range of motion. Neck supple.  Cardiovascular: Normal rate, regular rhythm and normal heart sounds.   No murmur heard.  Pulmonary/Chest: Effort normal and breath sounds normal. No respiratory distress.  Abdominal: Soft. There is no tenderness. There is no rebound and no guarding.  Musculoskeletal: Normal range of motion. He exhibits edema. He exhibits no tenderness.       RLE swollen, +2 DP and PT pulses  Neurological: He is alert and oriented to person, place, and time. No cranial nerve deficit. He exhibits normal muscle tone. Coordination normal.  Skin: Rash noted. There is erythema.       Red, hot, painful edema to R medial thigh, and lower leg.  Circumferential in lower leg with multiple abrasions.  3 cm area of erythema to R medial upper arm.    ED Course  Procedures (including critical care time)  Labs Reviewed  CBC WITH DIFFERENTIAL - Abnormal; Notable for the following:    WBC 10.6 (*)     MCHC 36.6 (*)  CORRECTED FOR INTERFERING SUBSTANCE   Platelets 139 (*)     Neutrophils Relative 84 (*)      Neutro Abs 8.9 (*)     Lymphocytes Relative 6 (*)     Lymphs Abs 0.6 (*)     Monocytes Absolute 1.1 (*)     All other components within normal limits  COMPREHENSIVE METABOLIC PANEL - Abnormal; Notable for the following:    Sodium 134 (*)     Glucose, Bld 104 (*)     GFR calc non Af Amer 75 (*)     GFR calc Af Amer 87 (*)     All other components within normal limits  URINALYSIS, ROUTINE W REFLEX MICROSCOPIC - Abnormal; Notable for the following:    Color, Urine ORANGE (*)  BIOCHEMICALS MAY BE AFFECTED BY COLOR   APPearance CLOUDY (*)     Specific Gravity, Urine 1.034 (*)     Bilirubin Urine SMALL (*)     Ketones, ur 15 (*)     Protein, ur 30 (*)     Nitrite POSITIVE (*)     Leukocytes, UA SMALL (*)     All other components within normal limits  URINE MICROSCOPIC-ADD ON - Abnormal; Notable for the following:    Bacteria, UA FEW (*)     Casts HYALINE CASTS (*)     All other components within normal limits  CULTURE, BLOOD (ROUTINE X 2)  CULTURE, BLOOD (ROUTINE X 2)   Dg Femur Right  08/24/2012  *RADIOLOGY REPORT*  Clinical Data: Edematous and erythematous right lower extremity and rash involving the entire right lower extremity, warm to touch. History of diabetes.  No known trauma or insect bites.  RIGHT FEMUR - 2 VIEW  Comparison: Right tibia-fibula x-rays obtained concurrently.  Findings: No evidence of acute or subacute fracture.  No intrinsic osseous abnormalities.  Visualized hip joint and knee joint intact.  IMPRESSION: Normal examination.   Original Report Authenticated By: Evangeline Dakin, M.D.    Dg Tibia/fibula Right  08/24/2012  *RADIOLOGY REPORT*  Clinical Data: Edematous and erythematous right lower extremity and rash involving the entire right lower extremity, warm to touch. History of diabetes.  No known trauma or insect bites.  RIGHT TIBIA AND FIBULA - 2 VIEW  Comparison: Right femur x-rays obtained concurrently.  Findings: No evidence of acute or subacute fracture  involving the tibia or fibula.  No intrinsic osseous abnormalities involving the tibia or fibula.  Small enthesopathic spur at the insertion of the Achilles tendon on the posterior calcaneus.  Visualized knee joint and ankle joint intact.  IMPRESSION: No  acute or significant osseous abnormality.   Original Report Authenticated By: Evangeline Dakin, M.D.      1. Cellulitis   2. Diabetes mellitus   3. Fever       MDM  History of diabetes presenting with what appears to be cellulitis of his right lower extremity. His warmth erythema, edema and pain.  Fever 103 with white count of 10. No gas seen on plain films. We'll treat for aggressive cellulitis compartments remained soft. There is no crepitance. Doubt necrotizing fasciitis.  With history of diabetes and high fever we'll admit for IV antibiotics. She's given vancomycin and cefepime due to penicillin allergy. Admission discussed with Dr. Wendee Beavers.      Ezequiel Essex, MD 08/24/12 [phone]

## 2012-08-24 NOTE — Progress Notes (Signed)
ANTIBIOTIC CONSULT NOTE - INITIAL  Pharmacy Consult for Vancomycin Indication: cellulitis  Allergies  Allergen Reactions  . Amoxicillin   . Penicillins Nausea And Vomiting    Patient Measurements: Height: 6\' 3"  (190.5 cm) Weight: 261 lb 0.4 oz (118.4 kg) IBW/kg (Calculated) : 84.5   Labs:  Basename 08/24/12 1553  WBC 10.6*  HGB 15.4  PLT 139*  LABCREA --  CREATININE 1.20   Estimated Creatinine Clearance: 115.8 ml/min (by C-G formula based on Cr of 1.2). No results found for this basename: VANCOTROUGH:2,VANCOPEAK:2,VANCORANDOM:2,GENTTROUGH:2,GENTPEAK:2,GENTRANDOM:2,TOBRATROUGH:2,TOBRAPEAK:2,TOBRARND:2,AMIKACINPEAK:2,AMIKACINTROU:2,AMIKACIN:2, in the last 72 hours   Microbiology: No results found for this or any previous visit (from the past 720 hour(s)).  Medical History: Past Medical History  Diagnosis Date  . Bipolar 1 disorder   . Diabetes mellitus    Assessment: 36 year old male with diabetes who presents with a painful red rash to the right leg and arm for the past day.  Started yesterday in upper medial thigh and has progressed down.  Also has an area of blotchy redness and pain to the right upper arm.  Beginning IV vancomycin  Goal of Therapy:  Vancomycin trough level 10-15 mcg/ml  Plan:  1) Vancomycin 1500 mg iv x 1 dose now then vancomycin 1000 mg iv Q 12 hours 2) Follow up progress, cultures, renal function  Thank you. Anette Guarneri, PharmD [phone]  Tad Moore 08/24/2012,9:51 PM

## 2012-08-24 NOTE — Progress Notes (Signed)
Patient was transferred from [facility] via [place]. Provider on call was notified. Patient was oriented to room and armband was placed. Will continue to monitor.   Betti Cruz RN BSN

## 2012-08-24 NOTE — ED Notes (Signed)
Pt c.o rash to right leg and right arm x 2 dats

## 2012-08-24 NOTE — ED Notes (Signed)
[facility] onsite for transfer

## 2012-08-24 NOTE — ED Notes (Signed)
[facility] here for transport at this time. 

## 2012-08-25 DIAGNOSIS — L0390 Cellulitis, unspecified: Secondary | ICD-10-CM

## 2012-08-25 DIAGNOSIS — M79609 Pain in unspecified limb: Secondary | ICD-10-CM

## 2012-08-25 DIAGNOSIS — E119 Type 2 diabetes mellitus without complications: Secondary | ICD-10-CM

## 2012-08-25 DIAGNOSIS — F319 Bipolar disorder, unspecified: Secondary | ICD-10-CM

## 2012-08-25 DIAGNOSIS — R509 Fever, unspecified: Secondary | ICD-10-CM

## 2012-08-25 DIAGNOSIS — M7989 Other specified soft tissue disorders: Secondary | ICD-10-CM

## 2012-08-25 LAB — GLUCOSE, CAPILLARY
Glucose-Capillary: 98 mg/dL (ref 70–99)
Glucose-Capillary: 100 mg/dL — ABNORMAL HIGH (ref 70–99)

## 2012-08-25 LAB — HEMOGLOBIN A1C: Hgb A1c MFr Bld: 5.6 % (ref <5.7)

## 2012-08-25 MED ORDER — OXYCODONE-ACETAMINOPHEN 5-325 MG PO TABS
1.0000 | ORAL_TABLET | ORAL | Status: DC | PRN
  Administered 2012-08-25 – 2012-08-27 (×10): 2 via ORAL
  Filled 2012-08-25 (×10): qty 2

## 2012-08-25 MED ORDER — HYDROXYZINE HCL 50 MG PO TABS
50.0000 mg | ORAL_TABLET | Freq: Three times a day (TID) | ORAL | Status: DC
  Administered 2012-08-25 – 2012-08-28 (×10): 50 mg via ORAL
  Filled 2012-08-25 (×12): qty 1

## 2012-08-25 NOTE — Progress Notes (Signed)
*  Preliminary Results* Right lower extremity venous duplex completed. Right lower extremity is negative for deep vein thrombosis. No evidence of right Baker's cyst.  08/25/2012 9:02 AM Maudry Mayhew, RDMS, RDCS

## 2012-08-25 NOTE — Progress Notes (Signed)
[facility] PROGRESS NOTE  James [name] [id number] DOB: 1975/10/05 DOA: 08/24/2012 PCP: No primary provider on file.  HPI/Subjective: Feels better, afebrile.   Assessment/Plan:  Cellulitis of the leg -Patient has significant cellulitis of the right lower extremity. -He is placed on vancomycin and cefepime. -Afebrile since yesterday, not a lot of change in the cellulitis. -Continue IV antibiotics for now.  Diabetes mellitus type 2 -Hemoglobin A1c last time was checked in May of 2012 was 8.3. -Will check hemoglobin A1c, patient is on carbohydrate modified diet, will utilize insulin sliding scale.  Bipolar affective disorder -Continue his psychotropic medications. -Is on valproic acid level is 73.4.  Code Status: Full code Family Communication: Spoke with patient Disposition Plan: Remains inpatient   Consultants:  None  Procedures:  None  Antibiotics:  Cefepime and vancomycin started on 08/24/2012   Objective: Filed Vitals:   08/24/12 1730 08/24/12 1903 08/24/12 2104 08/25/12 0300  BP:  100/58 103/69 101/65  Pulse: 85 83 77 81  Temp: 101 F (38.3 C) 98.1 F (36.7 C) 98.4 F (36.9 C) 99.3 F (37.4 C)  TempSrc: Oral Oral Oral Oral  Resp: 20 18 20 20   Height:   6\' 3"  (1.905 m)   Weight:   118.4 kg (261 lb 0.4 oz)   SpO2: 95% 96% 96% 94%    Intake/Output Summary (Last 24 hours) at 08/25/12 1150 Last data filed at 08/25/12 0946  Gross per 24 hour  Intake   1120 ml  Output    400 ml  Net    720 ml   Filed Weights   08/24/12 1432 08/24/12 2104  Weight: 122.471 kg (270 lb) 118.4 kg (261 lb 0.4 oz)    Exam:  General: Alert and awake, oriented x3, not in any acute distress. HEENT: anicteric sclera, pupils reactive to light and accommodation, EOMI CVS: S1-S2 clear, no murmur rubs or gallops Chest: clear to auscultation bilaterally, no wheezing, rales or rhonchi Abdomen: soft nontender, nondistended, normal bowel sounds, no  organomegaly Extremities: no cyanosis, clubbing or edema noted bilaterally Neuro: Cranial nerves II-XII intact, no focal neurological deficits  Data Reviewed: Basic Metabolic Panel:  Lab [id number] 2231 08/24/12 1553  NA -- 134*  K -- 3.6  CL -- 97  CO2 -- 24  GLUCOSE -- 104*  BUN -- 19  CREATININE 1.27 1.20  CALCIUM -- 8.9  MG -- --  PHOS -- --   Liver Function Tests:  Lab 08/24/12 1553  AST 16  ALT 24  ALKPHOS 43  BILITOT 0.8  PROT 7.2  ALBUMIN 3.6   No results found for this basename: LIPASE:5,AMYLASE:5 in the last 168 hours No results found for this basename: AMMONIA:5 in the last 168 hours CBC:  Lab 08/24/12 2231 08/24/12 1553  WBC 9.9 10.6*  NEUTROABS -- 8.9*  HGB 15.3 15.4  HCT 42.4 42.1  MCV 89.3 87.2  PLT 132* 139*   Cardiac Enzymes: No results found for this basename: CKTOTAL:5,CKMB:5,CKMBINDEX:5,TROPONINI:5 in the last 168 hours BNP (last 3 results) No results found for this basename: PROBNP:3 in the last 8760 hours CBG:  Lab 08/25/12 0801 08/24/12 2202  GLUCAP 98 106*    Recent Results (from the past 240 hour(s))  CULTURE, BLOOD (ROUTINE X 2)     Status: Normal (Preliminary result)   Collection Time   08/24/12  3:53 PM      Component Value Range Status Comment   Specimen Description Blood   Final    Special Requests NONE  Final    Culture  Setup Time 08/24/2012 23:47   Final    Culture     Final    Value:        BLOOD CULTURE RECEIVED NO GROWTH TO DATE CULTURE WILL BE HELD FOR 5 DAYS BEFORE ISSUING A FINAL NEGATIVE REPORT   Report Status PENDING   Incomplete   CULTURE, BLOOD (ROUTINE X 2)     Status: Normal (Preliminary result)   Collection Time   08/24/12  4:02 PM      Component Value Range Status Comment   Specimen Description Blood   Final    Special Requests NONE   Final    Culture  Setup Time 08/24/2012 23:47   Final    Culture     Final    Value:        BLOOD CULTURE RECEIVED NO GROWTH TO DATE CULTURE WILL BE HELD FOR 5 DAYS  BEFORE ISSUING A FINAL NEGATIVE REPORT   Report Status PENDING   Incomplete      Studies: Dg Femur Right  08/24/2012  *RADIOLOGY REPORT*  Clinical Data: Edematous and erythematous right lower extremity and rash involving the entire right lower extremity, warm to touch. History of diabetes.  No known trauma or insect bites.  RIGHT FEMUR - 2 VIEW  Comparison: Right tibia-fibula x-rays obtained concurrently.  Findings: No evidence of acute or subacute fracture.  No intrinsic osseous abnormalities.  Visualized hip joint and knee joint intact.  IMPRESSION: Normal examination.   Original Report Authenticated By: Evangeline Dakin, M.D.    Dg Tibia/fibula Right  08/24/2012  *RADIOLOGY REPORT*  Clinical Data: Edematous and erythematous right lower extremity and rash involving the entire right lower extremity, warm to touch. History of diabetes.  No known trauma or insect bites.  RIGHT TIBIA AND FIBULA - 2 VIEW  Comparison: Right femur x-rays obtained concurrently.  Findings: No evidence of acute or subacute fracture involving the tibia or fibula.  No intrinsic osseous abnormalities involving the tibia or fibula.  Small enthesopathic spur at the insertion of the Achilles tendon on the posterior calcaneus.  Visualized knee joint and ankle joint intact.  IMPRESSION: No acute or significant osseous abnormality.   Original Report Authenticated By: Evangeline Dakin, M.D.     Scheduled Meds:   . sodium chloride   Intravenous STAT  . [COMPLETED] acetaminophen  650 mg Oral Once  . benztropine  1 mg Oral Daily  . benztropine  2 mg Oral QHS  . ceFEPime (MAXIPIME) IV  1 g Intravenous Q12H  . [EXPIRED] ceFEPIme      . divalproex  1,000 mg Oral BID  . enoxaparin (LOVENOX) injection  40 mg Subcutaneous Q24H  . [COMPLETED]  HYDROmorphone (DILAUDID) injection  1 mg Intravenous Once  . hydrOXYzine  50 mg Oral TID  . insulin aspart  0-20 Units Subcutaneous TID WC  . insulin aspart  0-5 Units Subcutaneous QHS  .  metFORMIN  500 mg Oral BID WC  . [COMPLETED]  morphine injection  4 mg Intravenous Once  . [COMPLETED] ondansetron  4 mg Intravenous Once  . risperiDONE  4 mg Oral QHS  . [COMPLETED] vancomycin  1,500 mg Intravenous Once  . [COMPLETED] vancomycin  1,000 mg Intravenous Once  . vancomycin  1,000 mg Intravenous Q12H  . [DISCONTINUED] ceFEPime (MAXIPIME) IV  1 g Intravenous Q12H  . [DISCONTINUED] hydrOXYzine  50 mg Oral TID  . [DISCONTINUED] risperidone  4 mg Oral QHS   Continuous Infusions:  Active Problems:  Urinary tract infection  Fever  Diabetes mellitus  Cellulitis  Bipolar affective disorder    Time spent: 35 minutes    [facility] Pager [phone]. If 8PM-8AM, please contact night-coverage at [email or web address], password [facility] 08/25/2012, 11:50 AM  LOS: 1 day

## 2012-08-25 NOTE — H&P (Signed)
[facility] History and Physical  DEBRA WARMOTH [id number] DOB: Jun 30, 1976    PCP:   No primary provider on file.   Chief Complaint: right leg rash.  HPI: James [name] is an 36 y.o. male with Bipolar disorder. DM on Metformin, mild obesity, presents to [facility] with a rash on his right leg.  It started on the right thigh and progressed down to the ankle.  He has no other rash.  Denied fever or chills, nausea or vomiting.  He has some pain on his leg.  Evaluation in the ER included a Tmax of 103.5, with WBC of 10.6K.  He was given Vanco/ Cefepime as he is allergic to PCN, and hospitalist was asked to admit him for diabetic cellulitis.  Rewiew of Systems:  Constitutional: Negative for malaise,  No significant weight loss or weight gain Eyes: Negative for eye pain, redness and discharge, diplopia, visual changes, or flashes of light. ENMT: Negative for ear pain, hoarseness, nasal congestion, sinus pressure and sore throat. No headaches; tinnitus, drooling, or problem swallowing. Cardiovascular: Negative for chest pain, palpitations, diaphoresis, dyspnea and peripheral edema. ; No orthopnea, PND Respiratory: Negative for cough, hemoptysis, wheezing and stridor. No pleuritic chestpain. Gastrointestinal: Negative for nausea, vomiting, diarrhea, constipation, abdominal pain, melena, blood in stool, hematemesis, jaundice and rectal bleeding.    Genitourinary: Negative for frequency, dysuria, incontinence,flank pain and hematuria; Musculoskeletal: Negative for back pain and neck pain. Negative for swelling and trauma.;  Skin: . Negative for pruritus,  abrasions, bruising and skin lesion.; ulcerations Neuro: Negative for headache, lightheadedness and neck stiffness. Negative for weakness, altered level of consciousness , altered mental status, extremity weakness, burning feet, involuntary movement, seizure and syncope.  Psych: negative for anxiety, depression, insomnia, tearfulness,  panic attacks, hallucinations, paranoia, suicidal or homicidal ideation    Past Medical History  Diagnosis Date  . Type II diabetes mellitus ~ 2009    "take Metformin" (08/24/2012)  . Bipolar 1 disorder   . Cellulitis 08/24/2012    "RLE and spot on my right forearm" (08/24/2012)    Past Surgical History  Procedure Date  . Open anterior shoulder reconstruction ~ 2003    "right; kept dislocating; cut it open; sewed muscle back together; moved cartilage around and stapled that" (08/24/2012)  . Foot fracture surgery     "left; scaffold broke" 08/24/2012)    Medications:  HOME MEDS: Prior to Admission medications   Medication Sig Start Date End Date Taking? Authorizing Provider  benztropine (COGENTIN) 1 MG tablet Take 1-2 mg by mouth 2 (two) times daily. Take 1 tab in the morning and 2 tabs in the evening     Historical Provider, MD  divalproex (DEPAKOTE) 500 MG 24 hr tablet Take 1,000 mg by mouth 2 (two) times daily.      Historical Provider, MD  hydrocortisone cream 1 % Apply to affected area 2 times daily 03/16/12 03/16/13  Kathalene Frames, MD  hydrOXYzine (VISTARIL) 50 MG capsule Take 50 mg by mouth 3 (three) times daily.    Historical Provider, MD  METFORMIN HCL PO Take 1,000 mg by mouth every morning.     Historical Provider, MD  risperidone (RISPERDAL) 4 MG tablet Take 4 mg by mouth at bedtime.    Historical Provider, MD     Allergies:  Allergies  Allergen Reactions  . Amoxicillin Nausea And Vomiting    Social History:   reports that he has been smoking Cigarettes.  He has a 25 pack-year smoking history. He has  quit using smokeless tobacco. His smokeless tobacco use included Snuff and Chew. He reports that he drinks about 1.8 ounces of alcohol per week. He reports that he does not use illicit drugs.  Family History: History reviewed. No pertinent family history.   Physical Exam: Filed Vitals:   08/24/12 1628 08/24/12 1730 08/24/12 1903 08/24/12 2104  BP: 127/73  100/58  103/69  Pulse: 88 85 83 77  Temp: 103.5 F (39.7 C) 101 F (38.3 C) 98.1 F (36.7 C) 98.4 F (36.9 C)  TempSrc: Rectal Oral Oral Oral  Resp: 22 20 18 20   Height:    6\' 3"  (1.905 m)  Weight:    118.4 kg (261 lb 0.4 oz)  SpO2: 100% 95% 96% 96%   Blood pressure 103/69, pulse 77, temperature 98.4 F (36.9 C), temperature source Oral, resp. rate 20, height 6\' 3"  (1.905 m), weight 118.4 kg (261 lb 0.4 oz), SpO2 96.00%.  GEN:  Pleasant patient lying in the stretcher in no acute distress; cooperative with exam. PSYCH:  alert and oriented x4; does not appear anxious or depressed; affect is appropriate. HEENT: Mucous membranes pink and anicteric; PERRLA; EOM intact; no cervical lymphadenopathy nor thyromegaly or carotid bruit; no JVD; There were no stridor. Neck is very supple. Breasts:: Not examined CHEST WALL: No tenderness CHEST: Normal respiration, clear to auscultation bilaterally.  HEART: Regular rate and rhythm.  There are no murmur, rub, or gallops.   BACK: No kyphosis or scoliosis; no CVA tenderness ABDOMEN: soft and non-tender; no masses, no organomegaly, normal abdominal bowel sounds; no pannus; no intertriginous candida. There is no rebound and no distention. Rectal Exam: Not done EXTREMITIES: No bone or joint deformity; age-appropriate arthropathy of the hands and knees; no edema; no ulcerations.  There is no calf tenderness. Genitalia: not examined PULSES: 2+ and symmetric SKIN: erythematous non papular rash on right thigh and lower right leg.   CNS: Cranial nerves 2-12 grossly intact no focal lateralizing neurologic deficit.  Speech is fluent; uvula elevated with phonation, facial symmetry and tongue midline. DTR are normal bilaterally, cerebella exam is intact, barbinski is negative and strengths are equaled bilaterally.  No sensory loss.   Labs on Admission:  Basic Metabolic Panel:  Lab [id number] 2231 08/24/12 1553  NA -- 134*  K -- 3.6  CL -- 97  CO2 -- 24  GLUCOSE --  104*  BUN -- 19  CREATININE 1.27 1.20  CALCIUM -- 8.9  MG -- --  PHOS -- --   Liver Function Tests:  Lab 08/24/12 1553  AST 16  ALT 24  ALKPHOS 43  BILITOT 0.8  PROT 7.2  ALBUMIN 3.6   No results found for this basename: LIPASE:5,AMYLASE:5 in the last 168 hours No results found for this basename: AMMONIA:5 in the last 168 hours CBC:  Lab 08/24/12 2231 08/24/12 1553  WBC 9.9 10.6*  NEUTROABS -- 8.9*  HGB 15.3 15.4  HCT 42.4 42.1  MCV 89.3 87.2  PLT 132* 139*   Cardiac Enzymes: No results found for this basename: CKTOTAL:5,CKMB:5,CKMBINDEX:5,TROPONINI:5 in the last 168 hours  CBG:  Lab 08/24/12 2202  GLUCAP 106*     Radiological Exams on Admission: Dg Femur Right  08/24/2012  *RADIOLOGY REPORT*  Clinical Data: Edematous and erythematous right lower extremity and rash involving the entire right lower extremity, warm to touch. History of diabetes.  No known trauma or insect bites.  RIGHT FEMUR - 2 VIEW  Comparison: Right tibia-fibula x-rays obtained concurrently.  Findings: No evidence of  acute or subacute fracture.  No intrinsic osseous abnormalities.  Visualized hip joint and knee joint intact.  IMPRESSION: Normal examination.   Original Report Authenticated By: Evangeline Dakin, M.D.    Dg Tibia/fibula Right  08/24/2012  *RADIOLOGY REPORT*  Clinical Data: Edematous and erythematous right lower extremity and rash involving the entire right lower extremity, warm to touch. History of diabetes.  No known trauma or insect bites.  RIGHT TIBIA AND FIBULA - 2 VIEW  Comparison: Right femur x-rays obtained concurrently.  Findings: No evidence of acute or subacute fracture involving the tibia or fibula.  No intrinsic osseous abnormalities involving the tibia or fibula.  Small enthesopathic spur at the insertion of the Achilles tendon on the posterior calcaneus.  Visualized knee joint and ankle joint intact.  IMPRESSION: No acute or significant osseous abnormality.   Original Report  Authenticated By: Evangeline Dakin, M.D.     Assessment/Plan Present on Admission:  . Bipolar affective disorder Diabetic Cellulitis  PLAN:  This gentleman has diabetic cellulitis.  Will continue Van Cefepime.  He will need to get a US of the right leg just to be sure there is no concomitant DVT.  He will continue his Metformin for DM and have SSI to cover hyperglycemia.  He is stable, full code, and will be admitted to [facility] service.  I have continued his psych meds and check his Depakote level.  Other plans as per orders.  Code Status: FULL Haskel Khan, MD. [facility] Pager [phone] 7pm to 7am.  08/25/2012, 12:47 AM

## 2012-08-26 LAB — BASIC METABOLIC PANEL
BUN: 24 mg/dL — ABNORMAL HIGH (ref 6–23)
Chloride: 95 mEq/L — ABNORMAL LOW (ref 96–112)
GFR calc Af Amer: >90 mL/min (ref >90)
GFR calc non Af Amer: 86 mL/min — ABNORMAL LOW (ref >90)
Glucose, Bld: 83 mg/dL (ref 70–99)
Potassium: 3.7 mEq/L (ref 3.5–5.1)
Sodium: 132 mEq/L — ABNORMAL LOW (ref 135–145)

## 2012-08-26 LAB — VANCOMYCIN, TROUGH: Vancomycin Tr: 5.9 ug/mL — ABNORMAL LOW (ref 10.0–20.0)

## 2012-08-26 LAB — GLUCOSE, CAPILLARY
Glucose-Capillary: 92 mg/dL (ref 70–99)
Glucose-Capillary: 100 mg/dL — ABNORMAL HIGH (ref 70–99)
Glucose-Capillary: 92 mg/dL (ref 70–99)

## 2012-08-26 MED ORDER — VANCOMYCIN HCL 10 G IV SOLR
1250.0000 mg | Freq: Three times a day (TID) | INTRAVENOUS | Status: DC
  Administered 2012-08-27 – 2012-08-28 (×4): 1250 mg via INTRAVENOUS
  Filled 2012-08-26 (×6): qty 1250

## 2012-08-26 MED ORDER — CLINDAMYCIN PHOSPHATE 900 MG/50ML IV SOLN: 900.0000 mg | Freq: Three times a day (TID) | INTRAVENOUS | Status: DC

## 2012-08-26 MED ORDER — ASENAPINE MALEATE 5 MG SL SUBL
5.0000 mg | SUBLINGUAL_TABLET | Freq: Two times a day (BID) | SUBLINGUAL | Status: DC
  Administered 2012-08-26 – 2012-08-28 (×4): 5 mg via SUBLINGUAL
  Filled 2012-08-26 (×5): qty 1

## 2012-08-26 MED ORDER — SODIUM CHLORIDE 0.9 % IV SOLN
INTRAVENOUS | Status: DC
  Administered 2012-08-26 – 2012-08-27 (×3): via INTRAVENOUS

## 2012-08-26 NOTE — Progress Notes (Signed)
ANTIBIOTIC CONSULT NOTE - FOLLOW UP  Pharmacy Consult for Vancomycin Indication: cellulitis of lower extremity.  Allergies  Allergen Reactions  . Amoxicillin Nausea And Vomiting    Patient Measurements: Height: 6\' 3"  (190.5 cm) Weight: 261 lb 0.4 oz (118.4 kg) IBW/kg (Calculated) : 84.5  Adjusted Body Weight:   Vital Signs: Temp: 98.5 F (36.9 C) (12/13 2100) Temp src: Oral (12/13 2100) BP: 123/65 mmHg (12/13 2100) Pulse Rate: 56  (12/13 2100) Intake/Output from previous day: 12/12 0701 - 12/13 0700 In: 1070 [P.O.:820; IV Piggyback:250] Out: 300 [Stool:300] Intake/Output from this shift:    Labs:  Basename 08/26/12 0410 08/24/12 2231 08/24/12 1553  WBC -- 9.9 10.6*  HGB -- 15.3 15.4  PLT -- 132* 139*  LABCREA -- -- --  CREATININE 1.08 1.27 1.20   Estimated Creatinine Clearance: 128.7 ml/min (by C-G formula based on Cr of 1.08).  Basename 08/26/12 2112  VANCOTROUGH 5.9*  VANCOPEAK --  Jake Michaelis --  GENTTROUGH --  GENTPEAK --  GENTRANDOM --  TOBRATROUGH --  TOBRAPEAK --  TOBRARND --  AMIKACINPEAK --  AMIKACINTROU --  AMIKACIN --     Microbiology: Recent Results (from the past 720 hour(s))  CULTURE, BLOOD (ROUTINE X 2)     Status: Normal (Preliminary result)   Collection Time   08/24/12  3:53 PM      Component Value Range Status Comment   Specimen Description Blood   Final    Special Requests NONE   Final    Culture  Setup Time 08/24/2012 23:47   Final    Culture     Final    Value:        BLOOD CULTURE RECEIVED NO GROWTH TO DATE CULTURE WILL BE HELD FOR 5 DAYS BEFORE ISSUING A FINAL NEGATIVE REPORT   Report Status PENDING   Incomplete   CULTURE, BLOOD (ROUTINE X 2)     Status: Normal (Preliminary result)   Collection Time   08/24/12  4:02 PM      Component Value Range Status Comment   Specimen Description Blood   Final    Special Requests NONE   Final    Culture  Setup Time 08/24/2012 23:47   Final    Culture     Final    Value:        BLOOD  CULTURE RECEIVED NO GROWTH TO DATE CULTURE WILL BE HELD FOR 5 DAYS BEFORE ISSUING A FINAL NEGATIVE REPORT   Report Status PENDING   Incomplete     Anti-infectives     Start     Dose/Rate Route Frequency Ordered Stop   08/26/12 1400   clindamycin (CLEOCIN) IVPB 900 mg  Status:  Discontinued        900 mg 100 mL/hr over 30 Minutes Intravenous 3 times per day 08/26/12 1027 08/26/12 1044   08/25/12 1000   vancomycin (VANCOCIN) IVPB 1000 mg/200 mL premix        1,000 mg 200 mL/hr over 60 Minutes Intravenous Every 12 hours 08/24/12 2156     08/24/12 2200   ceFEPIme (MAXIPIME) 1 g in dextrose 5 % 50 mL IVPB  Status:  Discontinued        1 g 100 mL/hr over 30 Minutes Intravenous Every 12 hours 08/24/12 2149 08/24/12 2150   08/24/12 2200   vancomycin (VANCOCIN) 1,500 mg in sodium chloride 0.9 % 500 mL IVPB        1,500 mg 250 mL/hr over 120 Minutes Intravenous  Once 08/24/12 2155  08/25/12 0021   08/24/12 1727   ceFEPIme (MAXIPIME) 1 G injection     Comments: Modena Morrow: cabinet override         08/24/12 1727 08/25/12 0529   08/24/12 1515   vancomycin (VANCOCIN) IVPB 1000 mg/200 mL premix        1,000 mg 200 mL/hr over 60 Minutes Intravenous  Once 08/24/12 1503 08/24/12 1734   08/24/12 1515   ceFEPIme (MAXIPIME) 1 g in dextrose 5 % 50 mL IVPB  Status:  Discontinued        1 g 100 mL/hr over 30 Minutes Intravenous Every 12 hours 08/24/12 1503 08/26/12 1027          Assessment: Pt is getting Vancomycin 1 Gm IV q12hr. Vancomycin trough goal is 10-15 mcg/ml. The measured vancomycin trough came back at 5.9 mcg/ml which is subtherapeutic.  Goal of Therapy:  Vancomycin trough level 10-15 mcg/ml  Plan:  Will increase Vancomycin dose to 1250 mg IV q8h. Will check vanc trough level when appropriate.  Minta Balsam 08/26/2012,10:21 PM

## 2012-08-26 NOTE — Progress Notes (Signed)
[facility] PROGRESS NOTE  James [name] [id number] DOB: [birth year] DOA: 08/24/2012 PCP: No primary provider on file.  HPI/Subjective: Feels about the same, continues to be afebrile. Cellulitis is about the same, but not getting worse.   Assessment/Plan:  Cellulitis of the leg -Patient has significant cellulitis of the right lower extremity. -He is placed on vancomycin and cefepime. Discontinue cefepime add Cleocin -Afebrile since yesterday, not a lot of change in the cellulitis. -Continue IV antibiotics for now.  Diabetes mellitus type 2 -Controlled DM, hemoglobin A1c is 5.6, this correlates with many plasma glucose of 114. -Will check hemoglobin A1c, patient is on carbohydrate modified diet, will utilize insulin sliding scale.  Bipolar affective disorder -Continue his psychotropic medications. -Is on valproic acid level is 73.4.  Code Status: Full code Family Communication: Spoke with patient Disposition Plan: Remains inpatient   Consultants:  None  Procedures:  None  Antibiotics:  Cefepime and vancomycin started on 08/24/2012   Objective: Filed Vitals:   08/25/12 0300 08/25/12 1422 08/25/12 2100 08/26/12 0500  BP: 101/65 106/54 105/71 112/73  Pulse: 81 106 62 62  Temp: 99.3 F (37.4 C) 97.9 F (36.6 C) 98.1 F (36.7 C) 98.3 F (36.8 C)  TempSrc: Oral Oral Oral Oral  Resp: 20 20 12 16   Height:      Weight:      SpO2: 94% 94% 97% 97%    Intake/Output Summary (Last 24 hours) at 08/26/12 1024 Last data filed at 08/26/12 0959  Gross per 24 hour  Intake   1070 ml  Output    500 ml  Net    570 ml   Filed Weights   08/24/12 1432 08/24/12 2104  Weight: 122.471 kg (270 lb) 118.4 kg (261 lb 0.4 oz)    Exam:  General: Alert and awake, oriented x3, not in any acute distress. HEENT: anicteric sclera, pupils reactive to light and accommodation, EOMI CVS: S1-S2 clear, no murmur rubs or gallops Chest: clear to auscultation bilaterally,  no wheezing, rales or rhonchi Abdomen: soft nontender, nondistended, normal bowel sounds, no organomegaly Extremities: no cyanosis, clubbing or edema noted bilaterally Neuro: Cranial nerves II-XII intact, no focal neurological deficits  Data Reviewed: Basic Metabolic Panel:  Lab [id number] 0410 08/24/12 2231 08/24/12 1553  NA 132* -- 134*  K 3.7 -- 3.6  CL 95* -- 97  CO2 24 -- 24  GLUCOSE 83 -- 104*  BUN 24* -- 19  CREATININE 1.08 1.27 1.20  CALCIUM 9.0 -- 8.9  MG -- -- --  PHOS -- -- --   Liver Function Tests:  Lab 08/24/12 1553  AST 16  ALT 24  ALKPHOS 43  BILITOT 0.8  PROT 7.2  ALBUMIN 3.6   No results found for this basename: LIPASE:5,AMYLASE:5 in the last 168 hours No results found for this basename: AMMONIA:5 in the last 168 hours CBC:  Lab 08/24/12 2231 08/24/12 1553  WBC 9.9 10.6*  NEUTROABS -- 8.9*  HGB 15.3 15.4  HCT 42.4 42.1  MCV 89.3 87.2  PLT 132* 139*   Cardiac Enzymes: No results found for this basename: CKTOTAL:5,CKMB:5,CKMBINDEX:5,TROPONINI:5 in the last 168 hours BNP (last 3 results) No results found for this basename: PROBNP:3 in the last 8760 hours CBG:  Lab 08/26/12 0748 08/25/12 2159 08/25/12 1801 08/25/12 1210 08/25/12 0801  GLUCAP 92 100* 104* 118* 98    Recent Results (from the past 240 hour(s))  CULTURE, BLOOD (ROUTINE X 2)     Status: Normal (Preliminary result)   Collection  Time   08/24/12  3:53 PM      Component Value Range Status Comment   Specimen Description Blood   Final    Special Requests NONE   Final    Culture  Setup Time 08/24/2012 23:47   Final    Culture     Final    Value:        BLOOD CULTURE RECEIVED NO GROWTH TO DATE CULTURE WILL BE HELD FOR 5 DAYS BEFORE ISSUING A FINAL NEGATIVE REPORT   Report Status PENDING   Incomplete   CULTURE, BLOOD (ROUTINE X 2)     Status: Normal (Preliminary result)   Collection Time   08/24/12  4:02 PM      Component Value Range Status Comment   Specimen Description Blood   Final     Special Requests NONE   Final    Culture  Setup Time 08/24/2012 23:47   Final    Culture     Final    Value:        BLOOD CULTURE RECEIVED NO GROWTH TO DATE CULTURE WILL BE HELD FOR 5 DAYS BEFORE ISSUING A FINAL NEGATIVE REPORT   Report Status PENDING   Incomplete      Studies: Dg Femur Right  08/24/2012  *RADIOLOGY REPORT*  Clinical Data: Edematous and erythematous right lower extremity and rash involving the entire right lower extremity, warm to touch. History of diabetes.  No known trauma or insect bites.  RIGHT FEMUR - 2 VIEW  Comparison: Right tibia-fibula x-rays obtained concurrently.  Findings: No evidence of acute or subacute fracture.  No intrinsic osseous abnormalities.  Visualized hip joint and knee joint intact.  IMPRESSION: Normal examination.   Original Report Authenticated By: Evangeline Dakin, M.D.    Dg Tibia/fibula Right  08/24/2012  *RADIOLOGY REPORT*  Clinical Data: Edematous and erythematous right lower extremity and rash involving the entire right lower extremity, warm to touch. History of diabetes.  No known trauma or insect bites.  RIGHT TIBIA AND FIBULA - 2 VIEW  Comparison: Right femur x-rays obtained concurrently.  Findings: No evidence of acute or subacute fracture involving the tibia or fibula.  No intrinsic osseous abnormalities involving the tibia or fibula.  Small enthesopathic spur at the insertion of the Achilles tendon on the posterior calcaneus.  Visualized knee joint and ankle joint intact.  IMPRESSION: No acute or significant osseous abnormality.   Original Report Authenticated By: Evangeline Dakin, M.D.     Scheduled Meds:    . benztropine  1 mg Oral Daily  . benztropine  2 mg Oral QHS  . ceFEPime (MAXIPIME) IV  1 g Intravenous Q12H  . divalproex  1,000 mg Oral BID  . enoxaparin (LOVENOX) injection  40 mg Subcutaneous Q24H  . hydrOXYzine  50 mg Oral TID  . insulin aspart  0-20 Units Subcutaneous TID WC  . insulin aspart  0-5 Units Subcutaneous QHS   . metFORMIN  500 mg Oral BID WC  . risperiDONE  4 mg Oral QHS  . vancomycin  1,000 mg Intravenous Q12H   Continuous Infusions:   Active Problems:  Urinary tract infection  Fever  Diabetes mellitus  Cellulitis  Bipolar affective disorder    Time spent: 35 minutes    [facility] Pager [phone]. If 8PM-8AM, please contact night-coverage at [email or web address], password [facility] 08/26/2012, 10:24 AM  LOS: 2 days

## 2012-08-26 NOTE — Progress Notes (Signed)
ANTIBIOTIC CONSULT NOTE   Pharmacy Consult for Vancomycin Indication: cellulitis  Allergies  Allergen Reactions  . Amoxicillin Nausea And Vomiting    Patient Measurements: Height: 6\' 3"  (190.5 cm) Weight: 261 lb 0.4 oz (118.4 kg) IBW/kg (Calculated) : 84.5   Labs:  Basename 08/26/12 0410 08/24/12 2231 08/24/12 1553  WBC -- 9.9 10.6*  HGB -- 15.3 15.4  PLT -- 132* 139*  LABCREA -- -- --  CREATININE 1.08 1.27 1.20   Estimated Creatinine Clearance: 128.7 ml/min (by C-G formula based on Cr of 1.08). No results found for this basename: VANCOTROUGH:2,VANCOPEAK:2,VANCORANDOM:2,GENTTROUGH:2,GENTPEAK:2,GENTRANDOM:2,TOBRATROUGH:2,TOBRAPEAK:2,TOBRARND:2,AMIKACINPEAK:2,AMIKACINTROU:2,AMIKACIN:2, in the last 72 hours   Microbiology: Recent Results (from the past 720 hour(s))  CULTURE, BLOOD (ROUTINE X 2)     Status: Normal (Preliminary result)   Collection Time   08/24/12  3:53 PM      Component Value Range Status Comment   Specimen Description Blood   Final    Special Requests NONE   Final    Culture  Setup Time 08/24/2012 23:47   Final    Culture     Final    Value:        BLOOD CULTURE RECEIVED NO GROWTH TO DATE CULTURE WILL BE HELD FOR 5 DAYS BEFORE ISSUING A FINAL NEGATIVE REPORT   Report Status PENDING   Incomplete   CULTURE, BLOOD (ROUTINE X 2)     Status: Normal (Preliminary result)   Collection Time   08/24/12  4:02 PM      Component Value Range Status Comment   Specimen Description Blood   Final    Special Requests NONE   Final    Culture  Setup Time 08/24/2012 23:47   Final    Culture     Final    Value:        BLOOD CULTURE RECEIVED NO GROWTH TO DATE CULTURE WILL BE HELD FOR 5 DAYS BEFORE ISSUING A FINAL NEGATIVE REPORT   Report Status PENDING   Incomplete     Medical History: Past Medical History  Diagnosis Date  . Type II diabetes mellitus ~ 2009    "take Metformin" (08/24/2012)  . Bipolar 1 disorder   . Cellulitis 08/24/2012    "RLE and spot on my right  forearm" (08/24/2012)   Assessment: 36 year old male with diabetes who presents with a painful red rash to the right leg and arm for the past day.  Started  in upper medial thigh and has progressed down.  Also has an area of blotchy redness and pain to the right upper arm. Currently on vancomycin and cefepime  Goal of Therapy:  Vancomycin trough level 10-15 mcg/ml  Plan:  1) Check vancomycin trough tonight  Shadae Reino Poteet 08/26/2012,7:40 AM

## 2012-08-27 LAB — CBC
Hemoglobin: 12.2 g/dL — ABNORMAL LOW (ref 13.0–17.0)
MCH: 31.6 pg (ref 26.0–34.0)
MCHC: 35.5 g/dL (ref 30.0–36.0)
Platelets: 135 10*3/uL — ABNORMAL LOW (ref 150–400)
RDW: 12.6 % (ref 11.5–15.5)

## 2012-08-27 LAB — BASIC METABOLIC PANEL
BUN: 13 mg/dL (ref 6–23)
Calcium: 8.9 mg/dL (ref 8.4–10.5)
GFR calc Af Amer: >90 mL/min (ref >90)
GFR calc non Af Amer: >90 mL/min (ref >90)
Glucose, Bld: 97 mg/dL (ref 70–99)
Potassium: 3.4 mEq/L — ABNORMAL LOW (ref 3.5–5.1)
Sodium: 135 mEq/L (ref 135–145)

## 2012-08-27 LAB — GLUCOSE, CAPILLARY: Glucose-Capillary: 91 mg/dL (ref 70–99)

## 2012-08-27 MED ORDER — POTASSIUM CHLORIDE CRYS ER 20 MEQ PO TBCR
60.0000 meq | EXTENDED_RELEASE_TABLET | Freq: Once | ORAL | Status: AC
  Administered 2012-08-27: 60 meq via ORAL
  Filled 2012-08-27: qty 3

## 2012-08-27 NOTE — Progress Notes (Signed)
[facility] PROGRESS NOTE  James [name] [id number] DOB: [birth year] DOA: 08/24/2012 PCP: No primary provider on file.  HPI/Subjective: Feels about the same, continues to be afebrile. Cellulitis looks better, he thinks it's about the same but clearly the redness is down from the marked lines   Assessment/Plan:  Cellulitis of the leg -Patient has significant cellulitis of the right lower extremity. -He is placed on vancomycin and cefepime. -Afebrile since yesterday, improving slowly -Leg has some edema, likely IVF is contributing. No evidence of deep infection, dorsalis pedis pulse is strong. -Continue IV antibiotics for now.  Diabetes mellitus type 2 -Controlled DM, hemoglobin A1c is 5.6, this correlates with many plasma glucose of 114. -Will check hemoglobin A1c, patient is on carbohydrate modified diet, will utilize insulin sliding scale.  Bipolar affective disorder -Continue his psychotropic medications. -Is on valproic acid level is 73.4.  Code Status: Full code Family Communication: Spoke with patient Disposition Plan: Remains inpatient   Consultants:  None  Procedures:  None  Antibiotics:  Cefepime and vancomycin started on 08/24/2012   Objective: Filed Vitals:   08/26/12 0500 08/26/12 1405 08/26/12 2100 08/27/12 0705  BP: 112/73 125/67 123/65 122/65  Pulse: 62 58 56 62  Temp: 98.3 F (36.8 C) 97.9 F (36.6 C) 98.5 F (36.9 C) 98.3 F (36.8 C)  TempSrc: Oral Oral Oral Oral  Resp: 16 20 10 18   Height:      Weight:      SpO2: 97% 99% 99% 97%    Intake/Output Summary (Last 24 hours) at 08/27/12 1025 Last data filed at 08/27/12 0923  Gross per 24 hour  Intake 3111.67 ml  Output    600 ml  Net 2511.67 ml   Filed Weights   08/24/12 1432 08/24/12 2104  Weight: 122.471 kg (270 lb) 118.4 kg (261 lb 0.4 oz)    Exam:  General: Alert and awake, oriented x3, not in any acute distress. HEENT: anicteric sclera, pupils reactive to  light and accommodation, EOMI CVS: S1-S2 clear, no murmur rubs or gallops Chest: clear to auscultation bilaterally, no wheezing, rales or rhonchi Abdomen: soft nontender, nondistended, normal bowel sounds, no organomegaly Extremities: no cyanosis, clubbing or edema noted bilaterally Neuro: Cranial nerves II-XII intact, no focal neurological deficits  Data Reviewed: Basic Metabolic Panel:  Lab [id number] 08/26/12 0410 08/24/12 2231 08/24/12 1553  NA 135 132* -- 134*  K 3.4* 3.7 -- 3.6  CL 98 95* -- 97  CO2 25 24 -- 24  GLUCOSE 97 83 -- 104*  BUN 13 24* -- 19  CREATININE 0.85 1.08 1.27 1.20  CALCIUM 8.9 9.0 -- 8.9  MG -- -- -- --  PHOS -- -- -- --   Liver Function Tests:  Lab 08/24/12 1553  AST 16  ALT 24  ALKPHOS 43  BILITOT 0.8  PROT 7.2  ALBUMIN 3.6   No results found for this basename: LIPASE:5,AMYLASE:5 in the last 168 hours No results found for this basename: AMMONIA:5 in the last 168 hours CBC:  Lab 08/27/12 0510 08/24/12 2231 08/24/12 1553  WBC 5.7 9.9 10.6*  NEUTROABS -- -- 8.9*  HGB 12.2* 15.3 15.4  HCT 34.4* 42.4 42.1  MCV 89.1 89.3 87.2  PLT 135* 132* 139*   Cardiac Enzymes: No results found for this basename: CKTOTAL:5,CKMB:5,CKMBINDEX:5,TROPONINI:5 in the last 168 hours BNP (last 3 results) No results found for this basename: PROBNP:3 in the last 8760 hours CBG:  Lab 08/27/12 0743 08/26/12 2158 08/26/12 1654 08/26/12 1227 08/26/12 [id number]  GLUCAP 87 92 100* 85 92    Recent Results (from the past 240 hour(s))  CULTURE, BLOOD (ROUTINE X 2)     Status: Normal (Preliminary result)   Collection Time   08/24/12  3:53 PM      Component Value Range Status Comment   Specimen Description Blood   Final    Special Requests NONE   Final    Culture  Setup Time 08/24/2012 23:47   Final    Culture     Final    Value:        BLOOD CULTURE RECEIVED NO GROWTH TO DATE CULTURE WILL BE HELD FOR 5 DAYS BEFORE ISSUING A FINAL NEGATIVE REPORT   Report Status PENDING    Incomplete   CULTURE, BLOOD (ROUTINE X 2)     Status: Normal (Preliminary result)   Collection Time   08/24/12  4:02 PM      Component Value Range Status Comment   Specimen Description Blood   Final    Special Requests NONE   Final    Culture  Setup Time 08/24/2012 23:47   Final    Culture     Final    Value:        BLOOD CULTURE RECEIVED NO GROWTH TO DATE CULTURE WILL BE HELD FOR 5 DAYS BEFORE ISSUING A FINAL NEGATIVE REPORT   Report Status PENDING   Incomplete      Studies: No results found.  Scheduled Meds:    . asenapine  5 mg Sublingual BID  . benztropine  1 mg Oral Daily  . benztropine  2 mg Oral QHS  . divalproex  1,000 mg Oral BID  . enoxaparin (LOVENOX) injection  40 mg Subcutaneous Q24H  . hydrOXYzine  50 mg Oral TID  . insulin aspart  0-20 Units Subcutaneous TID WC  . insulin aspart  0-5 Units Subcutaneous QHS  . metFORMIN  500 mg Oral BID WC  . potassium chloride  60 mEq Oral Once  . risperiDONE  4 mg Oral QHS  . vancomycin  1,250 mg Intravenous Q8H   Continuous Infusions:   Active Problems:  Urinary tract infection  Fever  Diabetes mellitus  Cellulitis  Bipolar affective disorder    Time spent: 35 minutes    [facility] Pager [phone]. If 8PM-8AM, please contact night-coverage at [email or web address], password [facility] 08/27/2012, 10:25 AM  LOS: 3 days

## 2012-08-27 NOTE — Progress Notes (Signed)
Patient states that he was told that he would be started on all his psych medicines. Patient is currently not taking his Saphris, pharmacy was called to make sure that his dose of 5 mg SL was available. Provider on call was then contacted and patient was restarted on Saphris. Will continue to monitor.    James Hagan J. Conley Canal RN BSN

## 2012-08-28 LAB — BASIC METABOLIC PANEL
CO2: 26 mEq/L (ref 19–32)
Calcium: 9.3 mg/dL (ref 8.4–10.5)
Chloride: 102 mEq/L (ref 96–112)
Creatinine, Ser: 0.75 mg/dL (ref 0.50–1.35)
Glucose, Bld: 90 mg/dL (ref 70–99)

## 2012-08-28 LAB — GLUCOSE, CAPILLARY

## 2012-08-28 LAB — VANCOMYCIN, TROUGH: Vancomycin Tr: 10.8 ug/mL (ref 10.0–20.0)

## 2012-08-28 MED ORDER — DOXYCYCLINE HYCLATE 100 MG PO TABS: 100.0000 mg | ORAL_TABLET | Freq: Two times a day (BID) | ORAL | Status: DC

## 2012-08-28 NOTE — Discharge Summary (Signed)
Physician Discharge Summary  James [name] [id number] DOB: 1976/04/15 DOA: 08/24/2012  PCP: No primary provider on file.  Admit date: 08/24/2012 Discharge date: 08/28/2012  Time spent: 40 minutes  Recommendations for Outpatient Follow-up:  1. Followup as needed with his primary care physician  Discharge Diagnoses:  Active Problems:  Urinary tract infection  Fever  Diabetes mellitus  Cellulitis  Bipolar affective disorder   Discharge Condition: Stable  Diet recommendation: Carbohydrate modified diet  Filed Weights   08/24/12 1432 08/24/12 2104  Weight: 122.471 kg (270 lb) 118.4 kg (261 lb 0.4 oz)    History of present illness:  James [name] is an 36 y.o. male with Bipolar disorder. DM on Metformin, mild obesity, presents to [facility] with a rash on his right leg. It started on the right thigh and progressed down to the ankle. He has no other rash. Denied fever or chills, nausea or vomiting. He has some pain on his leg. Evaluation in the ER included a Tmax of 103.5, with WBC of 10.6K. He was given Vanco/ Cefepime as he is allergic to PCN, and hospitalist was asked to admit him for diabetic cellulitis.   Hospital Course:   1. Cellulitis of the right leg: Patient has significant cellulitis of the right lower extremity, the cellulitis was extending from his leg up to his thigh. Patient was placed on vancomycin and cefepime at the time of admission, patient did very well he was febrile when he came in just 24 hour after the institution of the antibiotics patient did not have any other fever. Leukocytosis resolved. Antibiotics were continued throughout the hospital stay on the day of discharge patient was discharged on doxycycline for 10 more days.  2. DM 2: Uncontrolled diabetes mellitus type 2, hemoglobin A1c is 5.6 which correlate with the plasma glucose of 114. Patient is on metformin 1000 mg daily, and he said he does not have primary care physician. Patient advised to  get a primary care physician to followup on his diabetes. Previous hemoglobin A1c was 8.3 on 02/11/2011.  3. Bipolar affective disorder: Continue his psychotropic medications, he is on valproic acid his level is therapeutic of 73.4. No changes were done to his medications.  Procedures:  Doppler ultrasound of the right lower extremity showed no evidence of DVT on 08/25/2012.  Consultations:  None  Discharge Exam: Filed Vitals:   08/27/12 0705 08/27/12 1333 08/27/12 2200 08/28/12 0621  BP: 122/65 125/69 132/71 121/71  Pulse: 62 59 56 56  Temp: 98.3 F (36.8 C) 98.3 F (36.8 C) 98.2 F (36.8 C) 98.1 F (36.7 C)  TempSrc: Oral Oral Oral Oral  Resp: 18 16 18 18   Height:      Weight:      SpO2: 97% 97% 97% 98%   General: Alert and awake, oriented x3, not in any acute distress. HEENT: anicteric sclera, pupils reactive to light and accommodation, EOMI CVS: S1-S2 clear, no murmur rubs or gallops Chest: clear to auscultation bilaterally, no wheezing, rales or rhonchi Abdomen: soft nontender, nondistended, normal bowel sounds, no organomegaly Extremities: no cyanosis, clubbing or edema noted bilaterally Neuro: Cranial nerves II-XII intact, no focal neurological deficits  Discharge Instructions  Discharge Orders    Future Orders Please Complete By Expires   Increase activity slowly          Medication List     As of 08/28/2012 10:10 AM    TAKE these medications         benztropine 1 MG tablet  Commonly known as: COGENTIN   Take 1-2 mg by mouth 2 (two) times daily. Take 1 tab in the morning and 2 tabs in the evening      divalproex 500 MG 24 hr tablet   Commonly known as: DEPAKOTE ER   Take 2,000 mg by mouth at bedtime.      doxycycline 100 MG tablet   Commonly known as: VIBRA-TABS   Take 1 tablet (100 mg total) by mouth 2 (two) times daily.      hydrOXYzine 50 MG capsule   Commonly known as: VISTARIL   Take 50 mg by mouth 3 (three) times daily.      metFORMIN  1000 MG (MOD) 24 hr tablet   Commonly known as: GLUMETZA   Take 1,000 mg by mouth daily with breakfast.      risperidone 4 MG tablet   Commonly known as: RISPERDAL   Take 6 mg by mouth at bedtime.      SAPHRIS 5 MG Subl   Generic drug: asenapine   Place 5 mg under the tongue 2 (two) times daily.          The results of significant diagnostics from this hospitalization (including imaging, microbiology, ancillary and laboratory) are listed below for reference.    Significant Diagnostic Studies: Dg Femur Right  08/24/2012  *RADIOLOGY REPORT*  Clinical Data: Edematous and erythematous right lower extremity and rash involving the entire right lower extremity, warm to touch. History of diabetes.  No known trauma or insect bites.  RIGHT FEMUR - 2 VIEW  Comparison: Right tibia-fibula x-rays obtained concurrently.  Findings: No evidence of acute or subacute fracture.  No intrinsic osseous abnormalities.  Visualized hip joint and knee joint intact.  IMPRESSION: Normal examination.   Original Report Authenticated By: Evangeline Dakin, M.D.    Dg Tibia/fibula Right  08/24/2012  *RADIOLOGY REPORT*  Clinical Data: Edematous and erythematous right lower extremity and rash involving the entire right lower extremity, warm to touch. History of diabetes.  No known trauma or insect bites.  RIGHT TIBIA AND FIBULA - 2 VIEW  Comparison: Right femur x-rays obtained concurrently.  Findings: No evidence of acute or subacute fracture involving the tibia or fibula.  No intrinsic osseous abnormalities involving the tibia or fibula.  Small enthesopathic spur at the insertion of the Achilles tendon on the posterior calcaneus.  Visualized knee joint and ankle joint intact.  IMPRESSION: No acute or significant osseous abnormality.   Original Report Authenticated By: Evangeline Dakin, M.D.     Microbiology: Recent Results (from the past 240 hour(s))  CULTURE, BLOOD (ROUTINE X 2)     Status: Normal (Preliminary result)    Collection Time   08/24/12  3:53 PM      Component Value Range Status Comment   Specimen Description Blood   Final    Special Requests NONE   Final    Culture  Setup Time 08/24/2012 23:47   Final    Culture     Final    Value:        BLOOD CULTURE RECEIVED NO GROWTH TO DATE CULTURE WILL BE HELD FOR 5 DAYS BEFORE ISSUING A FINAL NEGATIVE REPORT   Report Status PENDING   Incomplete   CULTURE, BLOOD (ROUTINE X 2)     Status: Normal (Preliminary result)   Collection Time   08/24/12  4:02 PM      Component Value Range Status Comment   Specimen Description Blood   Final    Special Requests  NONE   Final    Culture  Setup Time 08/24/2012 23:47   Final    Culture     Final    Value:        BLOOD CULTURE RECEIVED NO GROWTH TO DATE CULTURE WILL BE HELD FOR 5 DAYS BEFORE ISSUING A FINAL NEGATIVE REPORT   Report Status PENDING   Incomplete      Labs: Basic Metabolic Panel:  Lab [id number] 08/27/12 0510 08/26/12 0410 08/24/12 2231 08/24/12 1553  NA 137 135 132* -- 134*  K 4.2 3.4* 3.7 -- 3.6  CL 102 98 95* -- 97  CO2 26 25 24  -- 24  GLUCOSE 90 97 83 -- 104*  BUN 13 13 24* -- 19  CREATININE 0.75 0.85 1.08 1.27 1.20  CALCIUM 9.3 8.9 9.0 -- 8.9  MG -- -- -- -- --  PHOS -- -- -- -- --   Liver Function Tests:  Lab 08/24/12 1553  AST 16  ALT 24  ALKPHOS 43  BILITOT 0.8  PROT 7.2  ALBUMIN 3.6   No results found for this basename: LIPASE:5,AMYLASE:5 in the last 168 hours No results found for this basename: AMMONIA:5 in the last 168 hours CBC:  Lab 08/27/12 0510 08/24/12 2231 08/24/12 1553  WBC 5.7 9.9 10.6*  NEUTROABS -- -- 8.9*  HGB 12.2* 15.3 15.4  HCT 34.4* 42.4 42.1  MCV 89.1 89.3 87.2  PLT 135* 132* 139*   Cardiac Enzymes: No results found for this basename: CKTOTAL:5,CKMB:5,CKMBINDEX:5,TROPONINI:5 in the last 168 hours BNP: BNP (last 3 results) No results found for this basename: PROBNP:3 in the last 8760 hours CBG:  Lab 08/28/12 0756 08/28/12 0620 08/27/12 2204  08/27/12 1714 08/27/12 1137  GLUCAP 93 97 91 118* 89       Signed:  Nile Prisk A  [facility] 08/28/2012, 10:10 AM

## 2012-08-28 NOTE — Progress Notes (Signed)
08/28/12 Patient to be discharged home today. IV site removed and discharge instructions reviewed with patient.

## 2012-08-30 LAB — CULTURE, BLOOD (ROUTINE X 2): Culture: NO GROWTH

## 2012-10-04 DIAGNOSIS — R1013 Epigastric pain: Secondary | ICD-10-CM | POA: Diagnosis not present

## 2012-10-04 DIAGNOSIS — F17200 Nicotine dependence, unspecified, uncomplicated: Secondary | ICD-10-CM | POA: Diagnosis not present

## 2012-10-18 DIAGNOSIS — E781 Pure hyperglyceridemia: Secondary | ICD-10-CM | POA: Diagnosis not present

## 2012-10-18 DIAGNOSIS — F17200 Nicotine dependence, unspecified, uncomplicated: Secondary | ICD-10-CM | POA: Diagnosis not present

## 2012-10-18 DIAGNOSIS — R1013 Epigastric pain: Secondary | ICD-10-CM | POA: Diagnosis not present

## 2012-10-18 DIAGNOSIS — E119 Type 2 diabetes mellitus without complications: Secondary | ICD-10-CM | POA: Diagnosis not present

## 2012-10-18 DIAGNOSIS — R030 Elevated blood-pressure reading, without diagnosis of hypertension: Secondary | ICD-10-CM | POA: Diagnosis not present

## 2012-10-20 ENCOUNTER — Other Ambulatory Visit: Payer: Self-pay | Admitting: Gastroenterology

## 2012-10-20 DIAGNOSIS — R1115 Cyclical vomiting syndrome unrelated to migraine: Secondary | ICD-10-CM | POA: Diagnosis not present

## 2012-10-20 DIAGNOSIS — R1013 Epigastric pain: Secondary | ICD-10-CM

## 2012-10-20 DIAGNOSIS — R1110 Vomiting, unspecified: Secondary | ICD-10-CM

## 2012-10-20 DIAGNOSIS — R634 Abnormal weight loss: Secondary | ICD-10-CM | POA: Diagnosis not present

## 2012-10-20 DIAGNOSIS — R197 Diarrhea, unspecified: Secondary | ICD-10-CM | POA: Diagnosis not present

## 2012-10-20 DIAGNOSIS — R6881 Early satiety: Secondary | ICD-10-CM | POA: Diagnosis not present

## 2012-10-25 ENCOUNTER — Ambulatory Visit
Admission: RE | Admit: 2012-10-25 | Discharge: 2012-10-25 | Disposition: A | Discharge status: Home / Self Care | Payer: Medicare Other | Source: Ambulatory Visit | Attending: Gastroenterology | Admitting: Gastroenterology

## 2012-10-25 DIAGNOSIS — R1110 Vomiting, unspecified: Secondary | ICD-10-CM

## 2012-10-25 DIAGNOSIS — R1013 Epigastric pain: Secondary | ICD-10-CM | POA: Diagnosis not present

## 2012-10-25 DIAGNOSIS — R634 Abnormal weight loss: Secondary | ICD-10-CM

## 2012-10-25 MED ORDER — IOHEXOL 300 MG/ML  SOLN
125.0000 mL | Freq: Once | INTRAMUSCULAR | Status: AC | PRN
  Administered 2012-10-25: 125 mL via INTRAVENOUS

## 2013-08-30 ENCOUNTER — Emergency Department (HOSPITAL_BASED_OUTPATIENT_CLINIC_OR_DEPARTMENT_OTHER)
Admission: EM | Admit: 2013-08-30 | Discharge: 2013-08-30 | Disposition: A | Discharge status: Home / Self Care | Payer: Medicare Other | Attending: Emergency Medicine | Admitting: Emergency Medicine

## 2013-08-30 ENCOUNTER — Emergency Department (HOSPITAL_BASED_OUTPATIENT_CLINIC_OR_DEPARTMENT_OTHER): Payer: Medicare Other

## 2013-08-30 ENCOUNTER — Encounter (HOSPITAL_BASED_OUTPATIENT_CLINIC_OR_DEPARTMENT_OTHER): Payer: Self-pay | Admitting: Emergency Medicine

## 2013-08-30 DIAGNOSIS — M25529 Pain in unspecified elbow: Secondary | ICD-10-CM | POA: Insufficient documentation

## 2013-08-30 DIAGNOSIS — M79609 Pain in unspecified limb: Secondary | ICD-10-CM | POA: Diagnosis not present

## 2013-08-30 DIAGNOSIS — F319 Bipolar disorder, unspecified: Secondary | ICD-10-CM | POA: Insufficient documentation

## 2013-08-30 DIAGNOSIS — Z872 Personal history of diseases of the skin and subcutaneous tissue: Secondary | ICD-10-CM | POA: Diagnosis not present

## 2013-08-30 DIAGNOSIS — E119 Type 2 diabetes mellitus without complications: Secondary | ICD-10-CM | POA: Diagnosis not present

## 2013-08-30 DIAGNOSIS — F17200 Nicotine dependence, unspecified, uncomplicated: Secondary | ICD-10-CM | POA: Diagnosis not present

## 2013-08-30 DIAGNOSIS — Z79899 Other long term (current) drug therapy: Secondary | ICD-10-CM | POA: Diagnosis not present

## 2013-08-30 DIAGNOSIS — M79602 Pain in left arm: Secondary | ICD-10-CM

## 2013-08-30 DIAGNOSIS — M25429 Effusion, unspecified elbow: Secondary | ICD-10-CM | POA: Insufficient documentation

## 2013-08-30 MED ORDER — TRAMADOL HCL 50 MG PO TABS: 50.0000 mg | ORAL_TABLET | Freq: Four times a day (QID) | ORAL | Status: DC | PRN

## 2013-08-30 NOTE — ED Provider Notes (Signed)
CSN: [id number]     Arrival date & time 08/30/13  1810 History  This chart was scribed for Veryl Speak, MD by Quintin Alto, ED Scribe. This patient was seen in room MH12/MH12 and the patient's care was started at 7:03 PM     Chief Complaint  Patient presents with  . Extremity Pain   The history is provided by the patient. No language interpreter was used.   HPI Comments: MALAKI TABACCO is a 37 y.o. male who presents to the Emergency Department complaining of 1 week of new, constant, gradually worsening left arm pain that begins at his mid upper arm and ends at the mid forearm. He describes this pain as aching and deep. Pt denies any fall or accidents that could explain this pain. He reports that his pain is worsened with movement. His pain is not relieved by anything.  He also denies any numbness or tingling. Pt has a history of diabetes, Bipolar disorder, and Cellulitis.   Past Medical History  Diagnosis Date  . Type II diabetes mellitus ~ 2009    "take Metformin" (08/24/2012)  . Bipolar 1 disorder   . Cellulitis 08/24/2012    "RLE and spot on my right forearm" (08/24/2012)   Past Surgical History  Procedure Laterality Date  . Open anterior shoulder reconstruction  ~ 2003    "right; kept dislocating; cut it open; sewed muscle back together; moved cartilage around and stapled that" (08/24/2012)  . Foot fracture surgery      "left; scaffold broke" 08/24/2012)   No family history on file. History  Substance Use Topics  . Smoking status: Current Every Day Smoker -- 1.00 packs/day for 25 years    Types: Cigarettes  . Smokeless tobacco: Former Systems developer    Types: Snuff, Chew     Comment: 08/24/2012 "stopped both snuff and chew ~ 10 yr ago"  . Alcohol Use: 1.8 oz/week    3 Shots of liquor per week     Comment: 08/24/2012 "vodka w/coke or sprite once/wk; little more on the holidays maybe"    Review of Systems   A complete 10 system review of systems was obtained and all  systems are negative except as noted in the HPI and PMH.    Allergies  Amoxicillin  Home Medications   Current Outpatient Rx  Name  Route  Sig  Dispense  Refill  . FLUoxetine (PROZAC) 40 MG capsule   Oral   Take 40 mg by mouth daily.         Marland Kitchen asenapine (SAPHRIS) 5 MG SUBL   Sublingual   Place 5 mg under the tongue 2 (two) times daily.         . benztropine (COGENTIN) 1 MG tablet   Oral   Take 1-2 mg by mouth 2 (two) times daily. Take 1 tab in the morning and 2 tabs in the evening          . divalproex (DEPAKOTE ER) 500 MG 24 hr tablet   Oral   Take 2,000 mg by mouth at bedtime.         Marland Kitchen doxycycline (VIBRA-TABS) 100 MG tablet   Oral   Take 1 tablet (100 mg total) by mouth 2 (two) times daily.   20 tablet   0   . hydrOXYzine (VISTARIL) 50 MG capsule   Oral   Take 50 mg by mouth 3 (three) times daily.         . metFORMIN (GLUMETZA) 1000 MG (MOD) 24  hr tablet   Oral   Take 1,000 mg by mouth daily with breakfast.         . risperidone (RISPERDAL) 4 MG tablet   Oral   Take 6 mg by mouth at bedtime.          BP 182/105  Pulse 73  Temp(Src) 98 F (36.7 C) (Oral)  Resp 16  SpO2 100% Physical Exam  Nursing note and vitals reviewed. Constitutional: He is oriented to person, place, and time. He appears well-developed and well-nourished. No distress.  HENT:  Head: Normocephalic and atraumatic.  Eyes: Conjunctivae and EOM are normal. No scleral icterus.  Neck: Normal range of motion.  Cardiovascular: Normal rate, regular rhythm, normal heart sounds and intact distal pulses.   Pulses intact to the left hand.   Pulmonary/Chest: Effort normal and breath sounds normal. No respiratory distress.  Musculoskeletal: Normal range of motion.  Left arm is grossly normal. Full ROM of the left shoulder and left elbow.   Neurological: He is alert and oriented to person, place, and time. He has normal strength. No sensory deficit.  Motor and sensory intact to the left  hand  Skin: Skin is warm and dry. No rash noted. He is not diaphoretic. No erythema. No pallor.  Psychiatric: He has a normal mood and affect. His behavior is normal.    ED Course  Procedures DIAGNOSTIC STUDIES: Oxygen Saturation is 100% on RA, normal by my interpretation.    COORDINATION OF CARE: 7:05 PM- Will order XRAYs of left humerus and left elbow. Pt advised of plan for treatment and pt agrees.  Labs Review Labs Reviewed - No data to display Imaging Review Dg Elbow Complete Left  08/30/2013   CLINICAL DATA:  Pain.  Cellulitis. No trauma history submitted.  EXAM: LEFT ELBOW - COMPLETE 3+ VIEW  COMPARISON:  None.  FINDINGS: No acute fracture or dislocation. No joint effusion. Suspect soft tissue swelling posterior to the distal humerus and olecranon.  IMPRESSION: Probable posterior soft tissue swelling, without acute osseous abnormality.   Electronically Signed   By: Abigail Miyamoto M.D.   On: 08/30/2013 19:32   Dg Humerus Left  08/30/2013   CLINICAL DATA:  Pain, no known injury  EXAM: LEFT HUMERUS - 2+ VIEW  COMPARISON:  None.  FINDINGS: There is no evidence of fracture or other focal bone lesions. Soft tissues are unremarkable.  IMPRESSION: Negative.   Electronically Signed   By: Lahoma Crocker M.D.   On: 08/30/2013 19:43      MDM  No diagnosis found. Patient presents with complaints of left arm pain in the absence of any injury or trauma. His physical examination is unremarkable and has good range of motion of the elbow and shoulder. X-rays were obtained which revealed soft tissue swelling to the posterior aspect of the left lower humerus, the significance of this I am uncertain. He will be discharged with a sling, NSAIDs, and tramadol. He is to followup if not improving in the next week.  I personally performed the services described in this documentation, which was scribed in my presence. The recorded information has been reviewed and is accurate.      Veryl Speak,  MD 08/30/13 2010

## 2013-08-30 NOTE — ED Notes (Signed)
Onset of generalized left arm pain x one week.  No known causative event/injury.  Denies numbness/tingling.  Painful ROM and at rest.

## 2013-08-30 NOTE — ED Notes (Signed)
Patient transported to X-ray 

## 2013-10-11 ENCOUNTER — Emergency Department (HOSPITAL_BASED_OUTPATIENT_CLINIC_OR_DEPARTMENT_OTHER): Payer: Medicare Other

## 2013-10-11 ENCOUNTER — Encounter (HOSPITAL_BASED_OUTPATIENT_CLINIC_OR_DEPARTMENT_OTHER): Payer: Self-pay | Admitting: Emergency Medicine

## 2013-10-11 ENCOUNTER — Emergency Department (HOSPITAL_BASED_OUTPATIENT_CLINIC_OR_DEPARTMENT_OTHER)
Admission: EM | Admit: 2013-10-11 | Discharge: 2013-10-11 | Disposition: A | Discharge status: Home / Self Care | Payer: Medicare Other | Attending: Emergency Medicine | Admitting: Emergency Medicine

## 2013-10-11 DIAGNOSIS — F17200 Nicotine dependence, unspecified, uncomplicated: Secondary | ICD-10-CM | POA: Diagnosis not present

## 2013-10-11 DIAGNOSIS — E119 Type 2 diabetes mellitus without complications: Secondary | ICD-10-CM | POA: Insufficient documentation

## 2013-10-11 DIAGNOSIS — M5412 Radiculopathy, cervical region: Secondary | ICD-10-CM | POA: Diagnosis not present

## 2013-10-11 DIAGNOSIS — Z792 Long term (current) use of antibiotics: Secondary | ICD-10-CM | POA: Diagnosis not present

## 2013-10-11 DIAGNOSIS — Z872 Personal history of diseases of the skin and subcutaneous tissue: Secondary | ICD-10-CM | POA: Diagnosis not present

## 2013-10-11 DIAGNOSIS — M79609 Pain in unspecified limb: Secondary | ICD-10-CM | POA: Insufficient documentation

## 2013-10-11 DIAGNOSIS — M25519 Pain in unspecified shoulder: Secondary | ICD-10-CM | POA: Insufficient documentation

## 2013-10-11 DIAGNOSIS — M6281 Muscle weakness (generalized): Secondary | ICD-10-CM | POA: Diagnosis not present

## 2013-10-11 DIAGNOSIS — M5380 Other specified dorsopathies, site unspecified: Secondary | ICD-10-CM | POA: Diagnosis not present

## 2013-10-11 DIAGNOSIS — Z79899 Other long term (current) drug therapy: Secondary | ICD-10-CM | POA: Insufficient documentation

## 2013-10-11 DIAGNOSIS — Z880 Allergy status to penicillin: Secondary | ICD-10-CM | POA: Diagnosis not present

## 2013-10-11 DIAGNOSIS — F319 Bipolar disorder, unspecified: Secondary | ICD-10-CM | POA: Diagnosis not present

## 2013-10-11 MED ORDER — CYCLOBENZAPRINE HCL 10 MG PO TABS
10.0000 mg | ORAL_TABLET | Freq: Once | ORAL | Status: AC
  Administered 2013-10-11: 10 mg via ORAL
  Filled 2013-10-11: qty 1

## 2013-10-11 MED ORDER — HYDROCODONE-ACETAMINOPHEN 5-325 MG PO TABS
1.0000 | ORAL_TABLET | Freq: Once | ORAL | Status: AC
  Administered 2013-10-11: 1 via ORAL
  Filled 2013-10-11: qty 1

## 2013-10-11 MED ORDER — HYDROCODONE-ACETAMINOPHEN 5-325 MG PO TABS: 1.0000 | ORAL_TABLET | ORAL | Status: DC | PRN

## 2013-10-11 MED ORDER — CYCLOBENZAPRINE HCL 10 MG PO TABS: 10.0000 mg | ORAL_TABLET | Freq: Three times a day (TID) | ORAL | Status: DC | PRN

## 2013-10-11 NOTE — ED Notes (Signed)
Upper back pain and left arm pain x 3-4 weeks-seen here for same-was advised to see PCP-states PCP advised him to come back here b/c they could not see him until next week-pt with steady gait into triage-NAD

## 2013-10-11 NOTE — ED Provider Notes (Signed)
CSN: [id number]     Arrival date & time 10/11/13  1142 History   First MD Initiated Contact with Patient 10/11/13 1204     Chief Complaint  Patient presents with  . Back Pain   (Consider location/radiation/quality/duration/timing/severity/associated sxs/prior Treatment) HPI Comments: Patient is 38 year old male who presents again to the ED with complaints of upper back and neck and left arm pain - he states that the pain started about 1 month ago and has gradually gotten worse, he states that initially the pain was more in his left shoulder but now he notices more pain in the neck and upper back with radiation into the shoulder and as far down as his left elbow.  He reports a "pins and needles" sensation and burning to the left elbow,  He denies any injury to his neck or shoulder, denies fever, chills, headache, lower back pain, nausea, vomiting.  He reports subjective weakness to his left arm.  He is right handed.  He denies numbness, loss of control of bowels or bladder.  The history is provided by the patient. No language interpreter was used.    Past Medical History  Diagnosis Date  . Type II diabetes mellitus ~ 2009    "take Metformin" (08/24/2012)  . Bipolar 1 disorder   . Cellulitis 08/24/2012    "RLE and spot on my right forearm" (08/24/2012)   Past Surgical History  Procedure Laterality Date  . Open anterior shoulder reconstruction  ~ 2003    "right; kept dislocating; cut it open; sewed muscle back together; moved cartilage around and stapled that" (08/24/2012)  . Foot fracture surgery      "left; scaffold broke" 08/24/2012)   No family history on file. History  Substance Use Topics  . Smoking status: Current Every Day Smoker -- 1.00 packs/day for 25 years    Types: Cigarettes  . Smokeless tobacco: Never Used     Comment: 08/24/2012 "stopped both snuff and chew ~ 10 yr ago"  . Alcohol Use: 0.0 oz/week    Review of Systems  All other systems reviewed and are  negative.    Allergies  Amoxicillin  Home Medications   Current Outpatient Rx  Name  Route  Sig  Dispense  Refill  . asenapine (SAPHRIS) 5 MG SUBL   Sublingual   Place 5 mg under the tongue 2 (two) times daily.         . benztropine (COGENTIN) 1 MG tablet   Oral   Take 1-2 mg by mouth 2 (two) times daily. Take 1 tab in the morning and 2 tabs in the evening          . divalproex (DEPAKOTE ER) 500 MG 24 hr tablet   Oral   Take 2,000 mg by mouth at bedtime.         Marland Kitchen doxycycline (VIBRA-TABS) 100 MG tablet   Oral   Take 1 tablet (100 mg total) by mouth 2 (two) times daily.   20 tablet   0   . FLUoxetine (PROZAC) 40 MG capsule   Oral   Take 40 mg by mouth daily.         . hydrOXYzine (VISTARIL) 50 MG capsule   Oral   Take 50 mg by mouth 3 (three) times daily.         . metFORMIN (GLUMETZA) 1000 MG (MOD) 24 hr tablet   Oral   Take 1,000 mg by mouth daily with breakfast.         .  risperidone (RISPERDAL) 4 MG tablet   Oral   Take 6 mg by mouth at bedtime.         . traMADol (ULTRAM) 50 MG tablet   Oral   Take 1 tablet (50 mg total) by mouth every 6 (six) hours as needed.   15 tablet   0    BP 177/107  Pulse 85  Temp(Src) 97.9 F (36.6 C) (Oral)  Resp 20  Ht 6\' 3"  (1.905 m)  Wt 280 lb (127.007 kg)  BMI 35.00 kg/m2  SpO2 96% Physical Exam  Nursing note and vitals reviewed. Constitutional: He is oriented to person, place, and time. He appears well-developed and well-nourished. No distress.  HENT:  Head: Normocephalic and atraumatic.  Right Ear: External ear normal.  Left Ear: External ear normal.  Nose: Nose normal.  Mouth/Throat: Oropharynx is clear and moist. No oropharyngeal exudate.  Eyes: Conjunctivae are normal. Pupils are equal, round, and reactive to light. No scleral icterus.  Neck: Normal range of motion. Neck supple. Spinous process tenderness and muscular tenderness present.    Cardiovascular: Normal rate, regular rhythm and  normal heart sounds.  Exam reveals no gallop and no friction rub.   No murmur heard. Pulmonary/Chest: Effort normal and breath sounds normal. He exhibits no tenderness.  Abdominal: Soft. Bowel sounds are normal. He exhibits no distension. There is no tenderness.  Musculoskeletal:       Left shoulder: He exhibits tenderness. He exhibits normal range of motion, no bony tenderness, no deformity, no spasm, normal pulse and normal strength.       Left elbow: He exhibits normal range of motion, no swelling, no effusion and no deformity. Tenderness found. Medial epicondyle and lateral epicondyle tenderness noted.       Cervical back: He exhibits tenderness and bony tenderness. He exhibits normal range of motion.       Back:  Neurological: He is alert and oriented to person, place, and time. He displays no atrophy. No cranial nerve deficit or sensory deficit. He exhibits normal muscle tone. He displays a negative Romberg sign. Coordination and gait normal. GCS eye subscore is 4. GCS verbal subscore is 5. GCS motor subscore is 6.  Reflex Scores:      Tricep reflexes are 2+ on the right side and 2+ on the left side.      Bicep reflexes are 2+ on the right side and 2+ on the left side. 5/5 strength biceps, triceps, deltoids, trapezius  Skin: Skin is warm and dry. No rash noted. No erythema. No pallor.  Psychiatric: He has a normal mood and affect. His behavior is normal. Judgment and thought content normal.    ED Course  Procedures (including critical care time) Labs Review Labs Reviewed - No data to display Imaging Review Ct Cervical Spine Wo Contrast  10/11/2013   CLINICAL DATA:  Neck pain and stiffness  EXAM: CT CERVICAL SPINE WITHOUT CONTRAST  TECHNIQUE: Multidetector CT imaging of the cervical spine was performed without intravenous contrast. Multiplanar CT image reconstructions were also generated.  COMPARISON:  None.  FINDINGS: The alignment is anatomic. The vertebral body heights are  maintained. There is no acute fracture. There is no static listhesis. The prevertebral soft tissues are normal. The intraspinal soft tissues are not fully imaged on this examination due to poor soft tissue contrast, but there is no gross soft tissue abnormality.  The disc spaces are maintained. There is a broad-based disc osteophyte complex with impression upon the ventral thecal sac at  C5-6. There is a broad-based disc osteophyte complex impressing on the ventral thecal sac at C6-7.  The visualized portions of the lung apices demonstrate no focal abnormality.  IMPRESSION: 1. Broad-based disc osteophyte complexes at C5-6 and C6-7 with mild impression upon the ventral thecal sac. 2. No acute osseous injury of the cervical spine.   Electronically Signed   By: Kathreen Devoid   On: 10/11/2013 13:14    EKG Interpretation   None       MDM  Cervical radiculopathy  Patient here with symptoms c/w cervical radiculopathy - there is no focal weakness noted.  CT notes osteophytes at C5-6 and C6-7 with impression on the ventral thecal sac.  Patient is to follow up with PCP for MRI scheduling and further evaluation, including referrals.  Patient is without alarming signs suggestive of cord compression or cauda equina.    Idalia Needle Joelyn Oms, PA-C 10/11/13 1353

## 2013-10-11 NOTE — Discharge Instructions (Signed)
Cervical Radiculopathy Cervical radiculopathy happens when a nerve in the neck is pinched or bruised by a slipped (herniated) disk or by arthritic changes in the bones of the cervical spine. This can occur due to an injury or as part of the normal aging process. Pressure on the cervical nerves can cause pain or numbness that runs from your neck all the way down into your arm and fingers. CAUSES  There are many possible causes, including:  Injury.  Muscle tightness in the neck from overuse.  Swollen, painful joints (arthritis).  Breakdown or degeneration in the bones and joints of the spine (spondylosis) due to aging.  Bone spurs that may develop near the cervical nerves. SYMPTOMS  Symptoms include pain, weakness, or numbness in the affected arm and hand. Pain can be severe or irritating. Symptoms may be worse when extending or turning the neck. DIAGNOSIS  Your caregiver will ask about your symptoms and do a physical exam. He or she may test your strength and reflexes. X-rays, CT scans, and MRI scans may be needed in cases of injury or if the symptoms do not go away after a period of time. Electromyography (EMG) or nerve conduction testing may be done to study how your nerves and muscles are working. TREATMENT  Your caregiver may recommend certain exercises to help relieve your symptoms. Cervical radiculopathy can, and often does, get better with time and treatment. If your problems continue, treatment options may include:  Wearing a soft collar for short periods of time.  Physical therapy to strengthen the neck muscles.  Medicines, such as nonsteroidal anti-inflammatory drugs (NSAIDs), oral corticosteroids, or spinal injections.  Surgery. Different types of surgery may be done depending on the cause of your problems. HOME CARE INSTRUCTIONS   Put ice on the affected area.  Put ice in a plastic bag.  Place a towel between your skin and the bag.  Leave the ice on for 15-20 minutes,  03-04 times a day or as directed by your caregiver.  If ice does not help, you can try using heat. Take a warm shower or bath, or use a hot water bottle as directed by your caregiver.  You may try a gentle neck and shoulder massage.  Use a flat pillow when you sleep.  Only take over-the-counter or prescription medicines for pain, discomfort, or fever as directed by your caregiver.  If physical therapy was prescribed, follow your caregiver's directions.  If a soft collar was prescribed, use it as directed. SEEK IMMEDIATE MEDICAL CARE IF:   Your pain gets much worse and cannot be controlled with medicines.  You have weakness or numbness in your hand, arm, face, or leg.  You have a high fever or a stiff, rigid neck.  You lose bowel or bladder control (incontinence).  You have trouble with walking, balance, or speaking. MAKE SURE YOU:   Understand these instructions.  Will watch your condition.  Will get help right away if you are not doing well or get worse. Document Released: 05/26/2001 Document Revised: 11/23/2011 Document Reviewed: 04/14/2011 [facility] Patient Information 2014 [place], [place].  Neuropathic Pain We often think that pain has a physical cause. If we get rid of the cause, the pain should go away. Nerves themselves can also cause pain. It is called neuropathic pain, which means nerve abnormality. It may be difficult for the patients who have it and for the treating caregivers. Pain is usually described as acute (short-lived) or chronic (long-lasting). Acute pain is related to the physical  sensations caused by an injury. It can last from a few seconds to many weeks, but it usually goes away when normal healing occurs. Chronic pain lasts beyond the typical healing time. With neuropathic pain, the nerve fibers themselves may be damaged or injured. They then send incorrect signals to other pain centers. The pain you feel is real, but the cause is not easy to find.  CAUSES    Chronic pain can result from diseases, such as diabetes and shingles (an infection related to chickenpox), or from trauma, surgery, or amputation. It can also happen without any known injury or disease. The nerves are sending pain messages, even though there is no identifiable cause for such messages.   Other common causes of neuropathy include diabetes, phantom limb pain, or Regional Pain Syndrome (RPS).  As with all forms of chronic back pain, if neuropathy is not correctly treated, there can be a number of associated problems that lead to a downward cycle for the patient. These include depression, sleeplessness, feelings of fear and anxiety, limited social interaction and inability to do normal daily activities or work.  The most dramatic and mysterious example of neuropathic pain is called "phantom limb syndrome." This occurs when an arm or a leg has been removed because of illness or injury. The brain still gets pain messages from the nerves that originally carried impulses from the missing limb. These nerves now seem to misfire and cause troubling pain.  Neuropathic pain often seems to have no cause. It responds poorly to standard pain treatment. Neuropathic pain can occur after:  Shingles (herpes zoster virus infection).  A lasting burning sensation of the skin, caused usually by injury to a peripheral nerve.  Peripheral neuropathy which is widespread nerve damage, often caused by diabetes or alcoholism.  Phantom limb pain following an amputation.  Facial nerve problems (trigeminal neuralgia).  Multiple sclerosis.  Reflex sympathetic dystrophy.  Pain which comes with cancer and cancer chemotherapy.  Entrapment neuropathy such as when pressure is put on a nerve such as in carpal tunnel syndrome.  Back, leg, and hip problems (sciatica).  Spine or back surgery.  HIV Infection or AIDS where nerves are infected by viruses. Your caregiver can explain items in the above list which  may apply to you. SYMPTOMS  Characteristics of neuropathic pain are:  Severe, sharp, electric shock-like, shooting, lightening-like, knife-like.  Pins and needles sensation.  Deep burning, deep cold, or deep ache.  Persistent numbness, tingling, or weakness.  Pain resulting from light touch or other stimulus that would not usually cause pain.  Increased sensitivity to something that would normally cause pain, such as a pinprick. Pain may persist for months or years following the healing of damaged tissues. When this happens, pain signals no longer sound an alarm about current injuries or injuries about to happen. Instead, the alarm system itself is not working correctly.  Neuropathic pain may get worse instead of better over time. For some people, it can lead to serious disability. It is important to be aware that severe injury in a limb can occur without a proper, protective pain response.Burns, cuts, and other injuries may go unnoticed. Without proper treatment, these injuries can become infected or lead to further disability. Take any injury seriously, and consult your caregiver for treatment. DIAGNOSIS  When you have a pain with no known cause, your caregiver will probably ask some specific questions:   Do you have any other conditions, such as diabetes, shingles, multiple sclerosis, or HIV infection?  How would you describe your pain? (Neuropathic pain is often described as shooting, stabbing, burning, or searing.)  Is your pain worse at any time of the day? (Neuropathic pain is usually worse at night.)  Does the pain seem to follow a certain physical pathway?  Does the pain come from an area that has missing or injured nerves? (An example would be phantom limb pain.)  Is the pain triggered by minor things such as rubbing against the sheets at night? These questions often help define the type of pain involved. Once your caregiver knows what is happening, treatment can begin.  Anticonvulsant, antidepressant drugs, and various pain relievers seem to work in some cases. If another condition, such as diabetes is involved, better management of that disorder may relieve the neuropathic pain.  TREATMENT  Neuropathic pain is frequently long-lasting and tends not to respond to treatment with narcotic type pain medication. It may respond well to other drugs such as antiseizure and antidepressant medications. Usually, neuropathic problems do not completely go away, but partial improvement is often possible with proper treatment. Your caregivers have large numbers of medications available to treat you. Do not be discouraged if you do not get immediate relief. Sometimes different medications or a combination of medications will be tried before you receive the results you are hoping for. See your caregiver if you have pain that seems to be coming from nowhere and does not go away. Help is available.  SEEK IMMEDIATE MEDICAL CARE IF:   There is a sudden change in the quality of your pain, especially if the change is on only one side of the body.  You notice changes of the skin, such as redness, black or purple discoloration, swelling, or an ulcer.  You cannot move the affected limbs. Document Released: 05/28/2004 Document Revised: 11/23/2011 Document Reviewed: 05/28/2004 [facility] Patient Information 2014 [place].

## 2013-10-11 NOTE — ED Provider Notes (Signed)
Medical screening examination/treatment/procedure(s) were performed by non-physician practitioner and as supervising physician I was immediately available for consultation/collaboration.  EKG Interpretation   None         Charles B. Karle Starch, MD 10/11/13 1515

## 2013-10-16 DIAGNOSIS — M25519 Pain in unspecified shoulder: Secondary | ICD-10-CM | POA: Diagnosis not present

## 2013-10-16 DIAGNOSIS — M5412 Radiculopathy, cervical region: Secondary | ICD-10-CM | POA: Diagnosis not present

## 2013-10-16 DIAGNOSIS — E119 Type 2 diabetes mellitus without complications: Secondary | ICD-10-CM | POA: Diagnosis not present

## 2013-10-26 ENCOUNTER — Emergency Department (HOSPITAL_BASED_OUTPATIENT_CLINIC_OR_DEPARTMENT_OTHER): Payer: Medicare Other

## 2013-10-26 ENCOUNTER — Inpatient Hospital Stay (HOSPITAL_BASED_OUTPATIENT_CLINIC_OR_DEPARTMENT_OTHER)
Admission: EM | Admit: 2013-10-26 | Discharge: 2013-10-30 | DRG: 872 | Disposition: A | Discharge status: Home / Self Care | Payer: Medicare Other | Attending: Internal Medicine | Admitting: Internal Medicine

## 2013-10-26 ENCOUNTER — Encounter (HOSPITAL_BASED_OUTPATIENT_CLINIC_OR_DEPARTMENT_OTHER): Payer: Self-pay | Admitting: Emergency Medicine

## 2013-10-26 DIAGNOSIS — R7989 Other specified abnormal findings of blood chemistry: Secondary | ICD-10-CM | POA: Diagnosis present

## 2013-10-26 DIAGNOSIS — L02419 Cutaneous abscess of limb, unspecified: Secondary | ICD-10-CM | POA: Diagnosis present

## 2013-10-26 DIAGNOSIS — L0291 Cutaneous abscess, unspecified: Secondary | ICD-10-CM | POA: Diagnosis not present

## 2013-10-26 DIAGNOSIS — F319 Bipolar disorder, unspecified: Secondary | ICD-10-CM | POA: Diagnosis present

## 2013-10-26 DIAGNOSIS — I10 Essential (primary) hypertension: Secondary | ICD-10-CM | POA: Diagnosis present

## 2013-10-26 DIAGNOSIS — E871 Hypo-osmolality and hyponatremia: Secondary | ICD-10-CM | POA: Diagnosis present

## 2013-10-26 DIAGNOSIS — M7989 Other specified soft tissue disorders: Secondary | ICD-10-CM | POA: Diagnosis not present

## 2013-10-26 DIAGNOSIS — R509 Fever, unspecified: Secondary | ICD-10-CM | POA: Diagnosis not present

## 2013-10-26 DIAGNOSIS — E119 Type 2 diabetes mellitus without complications: Secondary | ICD-10-CM | POA: Diagnosis not present

## 2013-10-26 DIAGNOSIS — L03119 Cellulitis of unspecified part of limb: Secondary | ICD-10-CM

## 2013-10-26 DIAGNOSIS — F17200 Nicotine dependence, unspecified, uncomplicated: Secondary | ICD-10-CM | POA: Diagnosis present

## 2013-10-26 DIAGNOSIS — Z79899 Other long term (current) drug therapy: Secondary | ICD-10-CM

## 2013-10-26 DIAGNOSIS — A419 Sepsis, unspecified organism: Secondary | ICD-10-CM | POA: Diagnosis not present

## 2013-10-26 DIAGNOSIS — M79609 Pain in unspecified limb: Secondary | ICD-10-CM | POA: Diagnosis not present

## 2013-10-26 DIAGNOSIS — L0390 Cellulitis, unspecified: Secondary | ICD-10-CM

## 2013-10-26 LAB — CBC WITH DIFFERENTIAL/PLATELET
BASOS ABS: 0 10*3/uL (ref 0.0–0.1)
BASOS PCT: 0 % (ref 0–1)
EOS PCT: 0 % (ref 0–5)
Eosinophils Absolute: 0 10*3/uL (ref 0.0–0.7)
HCT: 46.9 % (ref 39.0–52.0)
Hemoglobin: 16.3 g/dL (ref 13.0–17.0)
LYMPHS PCT: 5 % — AB (ref 12–46)
Lymphs Abs: 0.8 10*3/uL (ref 0.7–4.0)
MCH: 32.4 pg (ref 26.0–34.0)
MCHC: 34.8 g/dL (ref 30.0–36.0)
MCV: 93.2 fL (ref 78.0–100.0)
MONO ABS: 0.8 10*3/uL (ref 0.1–1.0)
Monocytes Relative: 5 % (ref 3–12)
NEUTROS ABS: 14.2 10*3/uL — AB (ref 1.7–7.7)
Neutrophils Relative %: 90 % — ABNORMAL HIGH (ref 43–77)
PLATELETS: 182 10*3/uL (ref 150–400)
RBC: 5.03 MIL/uL (ref 4.22–5.81)
RDW: 12.4 % (ref 11.5–15.5)
WBC: 15.8 10*3/uL — AB (ref 4.0–10.5)

## 2013-10-26 LAB — COMPREHENSIVE METABOLIC PANEL
ALBUMIN: 3.6 g/dL (ref 3.5–5.2)
ALT: 75 U/L — AB (ref 0–53)
AST: 43 U/L — AB (ref 0–37)
Alkaline Phosphatase: 61 U/L (ref 39–117)
BUN: 17 mg/dL (ref 6–23)
CALCIUM: 9.6 mg/dL (ref 8.4–10.5)
CO2: 23 meq/L (ref 19–32)
CREATININE: 1 mg/dL (ref 0.50–1.35)
Chloride: 86 mEq/L — ABNORMAL LOW (ref 96–112)
GFR calc Af Amer: >90 mL/min (ref >90)
Glucose, Bld: 140 mg/dL — ABNORMAL HIGH (ref 70–99)
Potassium: 4.1 mEq/L (ref 3.7–5.3)
SODIUM: 129 meq/L — AB (ref 137–147)
Total Bilirubin: 0.8 mg/dL (ref 0.3–1.2)
Total Protein: 7.8 g/dL (ref 6.0–8.3)

## 2013-10-26 LAB — CBC
HCT: 39.4 % (ref 39.0–52.0)
Hemoglobin: 14.2 g/dL (ref 13.0–17.0)
MCH: 33.6 pg (ref 26.0–34.0)
MCHC: 36 g/dL (ref 30.0–36.0)
MCV: 93.1 fL (ref 78.0–100.0)
PLATELETS: 153 10*3/uL (ref 150–400)
RBC: 4.23 MIL/uL (ref 4.22–5.81)
RDW: 12.7 % (ref 11.5–15.5)
WBC: 14 10*3/uL — ABNORMAL HIGH (ref 4.0–10.5)

## 2013-10-26 LAB — CREATININE, SERUM
Creatinine, Ser: 1.04 mg/dL (ref 0.50–1.35)
GFR calc Af Amer: >90 mL/min (ref >90)
GFR calc non Af Amer: 88 mL/min — ABNORMAL LOW (ref >90)

## 2013-10-26 LAB — GLUCOSE, CAPILLARY
Glucose-Capillary: 155 mg/dL — ABNORMAL HIGH (ref 70–99)
Glucose-Capillary: 153 mg/dL — ABNORMAL HIGH (ref 70–99)

## 2013-10-26 LAB — MRSA PCR SCREENING: MRSA by PCR: NEGATIVE

## 2013-10-26 LAB — CG4 I-STAT (LACTIC ACID): LACTIC ACID, VENOUS: 4.5 mmol/L — AB (ref 0.5–2.2)

## 2013-10-26 MED ORDER — VANCOMYCIN HCL IN DEXTROSE 1-5 GM/200ML-% IV SOLN
1000.0000 mg | Freq: Once | INTRAVENOUS | Status: AC
  Administered 2013-10-26: 1000 mg via INTRAVENOUS
  Filled 2013-10-26: qty 200

## 2013-10-26 MED ORDER — ONDANSETRON HCL 4 MG/2ML IJ SOLN: 4.0000 mg | Freq: Four times a day (QID) | INTRAMUSCULAR | Status: DC | PRN

## 2013-10-26 MED ORDER — ONDANSETRON HCL 4 MG/2ML IJ SOLN
4.0000 mg | Freq: Once | INTRAMUSCULAR | Status: AC
  Administered 2013-10-26: 4 mg via INTRAVENOUS
  Filled 2013-10-26: qty 2

## 2013-10-26 MED ORDER — SODIUM CHLORIDE 0.9 % IV BOLUS (SEPSIS)
1000.0000 mL | Freq: Once | INTRAVENOUS | Status: AC
  Administered 2013-10-26: 1000 mL via INTRAVENOUS

## 2013-10-26 MED ORDER — PNEUMOCOCCAL VAC POLYVALENT 25 MCG/0.5ML IJ INJ
0.5000 mL | INJECTION | INTRAMUSCULAR | Status: AC
  Administered 2013-10-28: 0.5 mL via INTRAMUSCULAR
  Filled 2013-10-26: qty 0.5

## 2013-10-26 MED ORDER — CEFEPIME HCL 2 G IJ SOLR
INTRAMUSCULAR | Status: AC
Start: 2013-10-26 — End: 2013-10-27
  Filled 2013-10-26: qty 2

## 2013-10-26 MED ORDER — INFLUENZA VAC SPLIT QUAD 0.5 ML IM SUSP
0.5000 mL | INTRAMUSCULAR | Status: AC
  Administered 2013-10-28: 0.5 mL via INTRAMUSCULAR
  Filled 2013-10-26: qty 0.5

## 2013-10-26 MED ORDER — FLUOXETINE HCL 20 MG PO CAPS
40.0000 mg | ORAL_CAPSULE | Freq: Every day | ORAL | Status: DC
  Administered 2013-10-27 – 2013-10-30 (×4): 40 mg via ORAL
  Filled 2013-10-26 (×4): qty 2

## 2013-10-26 MED ORDER — BENZTROPINE MESYLATE 2 MG PO TABS
2.0000 mg | ORAL_TABLET | Freq: Every day | ORAL | Status: DC
  Administered 2013-10-26 – 2013-10-29 (×4): 2 mg via ORAL
  Filled 2013-10-26 (×5): qty 1

## 2013-10-26 MED ORDER — DEXTROSE 5 % IV SOLN
2.0000 g | Freq: Two times a day (BID) | INTRAVENOUS | Status: DC
  Administered 2013-10-27 – 2013-10-30 (×7): 2 g via INTRAVENOUS
  Filled 2013-10-26 (×9): qty 2

## 2013-10-26 MED ORDER — VANCOMYCIN HCL 500 MG IV SOLR
500.0000 mg | Freq: Once | INTRAVENOUS | Status: AC
  Administered 2013-10-26: 500 mg via INTRAVENOUS
  Filled 2013-10-26: qty 500

## 2013-10-26 MED ORDER — ONDANSETRON HCL 4 MG/2ML IJ SOLN
4.0000 mg | Freq: Three times a day (TID) | INTRAMUSCULAR | Status: DC | PRN
  Administered 2013-10-26: 4 mg via INTRAVENOUS
  Filled 2013-10-26: qty 2

## 2013-10-26 MED ORDER — ONDANSETRON HCL 4 MG PO TABS: 4.0000 mg | ORAL_TABLET | Freq: Four times a day (QID) | ORAL | Status: DC | PRN

## 2013-10-26 MED ORDER — SODIUM CHLORIDE 0.9 % IV SOLN
INTRAVENOUS | Status: AC
Start: 2013-10-26 — End: 2013-10-27
  Administered 2013-10-26: 100 mL via INTRAVENOUS

## 2013-10-26 MED ORDER — INSULIN ASPART 100 UNIT/ML ~~LOC~~ SOLN
0.0000 [IU] | Freq: Three times a day (TID) | SUBCUTANEOUS | Status: DC
  Administered 2013-10-27 (×2): 2 [IU] via SUBCUTANEOUS
  Administered 2013-10-27 – 2013-10-28 (×4): 1 [IU] via SUBCUTANEOUS
  Administered 2013-10-29: 2 [IU] via SUBCUTANEOUS
  Administered 2013-10-29: 13:00:00 via SUBCUTANEOUS

## 2013-10-26 MED ORDER — ASENAPINE MALEATE 5 MG SL SUBL
5.0000 mg | SUBLINGUAL_TABLET | Freq: Two times a day (BID) | SUBLINGUAL | Status: DC
  Administered 2013-10-27 – 2013-10-30 (×7): 5 mg via SUBLINGUAL
  Filled 2013-10-26 (×11): qty 1

## 2013-10-26 MED ORDER — CEFEPIME HCL 2 G IJ SOLR
INTRAMUSCULAR | Status: AC
  Filled 2013-10-26: qty 2

## 2013-10-26 MED ORDER — MORPHINE SULFATE 4 MG/ML IJ SOLN
4.0000 mg | Freq: Once | INTRAMUSCULAR | Status: AC
  Administered 2013-10-26: 4 mg via INTRAVENOUS
  Filled 2013-10-26: qty 1

## 2013-10-26 MED ORDER — DIVALPROEX SODIUM ER 500 MG PO TB24
2000.0000 mg | ORAL_TABLET | Freq: Every day | ORAL | Status: DC
  Administered 2013-10-26 – 2013-10-29 (×4): 2000 mg via ORAL
  Filled 2013-10-26 (×5): qty 4

## 2013-10-26 MED ORDER — VANCOMYCIN HCL 10 G IV SOLR
1500.0000 mg | Freq: Once | INTRAVENOUS | Status: DC
  Filled 2013-10-26: qty 1500

## 2013-10-26 MED ORDER — ACETAMINOPHEN 325 MG PO TABS
650.0000 mg | ORAL_TABLET | Freq: Four times a day (QID) | ORAL | Status: DC | PRN
  Administered 2013-10-27 – 2013-10-30 (×6): 650 mg via ORAL
  Filled 2013-10-26 (×6): qty 2

## 2013-10-26 MED ORDER — RISPERIDONE 3 MG PO TABS
6.0000 mg | ORAL_TABLET | Freq: Every day | ORAL | Status: DC
  Administered 2013-10-26 – 2013-10-29 (×4): 6 mg via ORAL
  Filled 2013-10-26 (×5): qty 2

## 2013-10-26 MED ORDER — ACETAMINOPHEN 650 MG RE SUPP: 650.0000 mg | Freq: Four times a day (QID) | RECTAL | Status: DC | PRN

## 2013-10-26 MED ORDER — BENZTROPINE MESYLATE 1 MG PO TABS
1.0000 mg | ORAL_TABLET | Freq: Every day | ORAL | Status: DC
  Administered 2013-10-27 – 2013-10-30 (×4): 1 mg via ORAL
  Filled 2013-10-26 (×4): qty 1

## 2013-10-26 MED ORDER — DEXTROSE 5 % IV SOLN
2.0000 g | Freq: Once | INTRAVENOUS | Status: AC
  Administered 2013-10-26: 2 g via INTRAVENOUS

## 2013-10-26 MED ORDER — ENOXAPARIN SODIUM 60 MG/0.6ML ~~LOC~~ SOLN
60.0000 mg | SUBCUTANEOUS | Status: DC
  Administered 2013-10-27 – 2013-10-30 (×4): 60 mg via SUBCUTANEOUS
  Filled 2013-10-26 (×4): qty 0.6

## 2013-10-26 MED ORDER — SODIUM CHLORIDE 0.9 % IJ SOLN
3.0000 mL | Freq: Two times a day (BID) | INTRAMUSCULAR | Status: DC
  Administered 2013-10-27 – 2013-10-28 (×4): 3 mL via INTRAVENOUS
  Administered 2013-10-29: 12:00:00 via INTRAVENOUS
  Administered 2013-10-29 – 2013-10-30 (×2): 3 mL via INTRAVENOUS

## 2013-10-26 MED ORDER — VANCOMYCIN HCL 10 G IV SOLR
1250.0000 mg | Freq: Two times a day (BID) | INTRAVENOUS | Status: DC
  Administered 2013-10-27 – 2013-10-30 (×7): 1250 mg via INTRAVENOUS
  Filled 2013-10-26 (×9): qty 1250

## 2013-10-26 MED ORDER — HYDROMORPHONE HCL PF 1 MG/ML IJ SOLN
1.0000 mg | INTRAMUSCULAR | Status: AC | PRN
  Administered 2013-10-26 – 2013-10-27 (×3): 1 mg via INTRAVENOUS
  Filled 2013-10-26 (×3): qty 1

## 2013-10-26 MED ORDER — BENZTROPINE MESYLATE 1 MG PO TABS: 1.0000 mg | ORAL_TABLET | Freq: Two times a day (BID) | ORAL | Status: DC

## 2013-10-26 MED ORDER — TETANUS-DIPHTH-ACELL PERTUSSIS 5-2.5-18.5 LF-MCG/0.5 IM SUSP
0.5000 mL | Freq: Once | INTRAMUSCULAR | Status: AC
  Administered 2013-10-26: 0.5 mL via INTRAMUSCULAR
  Filled 2013-10-26: qty 0.5

## 2013-10-26 NOTE — ED Notes (Signed)
Right leg is swollen, hot, red and painful. Woke with chills. Hx of cellulitis in same leg 2 years ago.

## 2013-10-26 NOTE — ED Provider Notes (Signed)
CSN: [id number]     Arrival date & time 10/26/13  1616 History   First MD Initiated Contact with Patient 10/26/13 1633     Chief Complaint  Patient presents with  . Cellulitis     (Consider location/radiation/quality/duration/timing/severity/associated sxs/prior Treatment) HPI Comments: Patient presents with redness, warmth, pain to his right lower extremity that he woke up with this morning. Denies any falls or trauma. He's had chills this morning and cold sweats but denies any fever. Is a history of previous cellulitis in the same leg 2 years ago. He has a history of hypertension and diet-controlled diabetes. Her no longer takes metformin.  He endorses feeling lightheaded and dizzy.  .  Denies tick exposure, camping, injury, cuts.  No chest pain or SOB.  Has nausea.  Denies new medications, foods, products.   The history is provided by the patient and the spouse.    Past Medical History  Diagnosis Date  . Type II diabetes mellitus ~ 2009    "take Metformin" (08/24/2012)  . Bipolar 1 disorder   . Cellulitis 08/24/2012    "RLE and spot on my right forearm" (08/24/2012)   Past Surgical History  Procedure Laterality Date  . Open anterior shoulder reconstruction  ~ 2003    "right; kept dislocating; cut it open; sewed muscle back together; moved cartilage around and stapled that" (08/24/2012)  . Foot fracture surgery      "left; scaffold broke" 08/24/2012)   Family History  Problem Relation Age of Onset  . Diabetic kidney disease Maternal Uncle    History  Substance Use Topics  . Smoking status: Current Every Day Smoker -- 1.00 packs/day for 25 years    Types: Cigarettes  . Smokeless tobacco: Never Used     Comment: 08/24/2012 "stopped both snuff and chew ~ 10 yr ago"  . Alcohol Use: 0.0 oz/week    Review of Systems  Constitutional: Positive for chills, activity change, appetite change and fatigue. Negative for fever.  Respiratory: Negative for cough, chest tightness and  shortness of breath.   Cardiovascular: Positive for leg swelling. Negative for chest pain.  Gastrointestinal: Negative for nausea, vomiting and abdominal pain.  Genitourinary: Negative for dysuria and hematuria.  Musculoskeletal: Negative for back pain.  Skin: Positive for rash.  Neurological: Negative for dizziness, weakness and headaches.  A complete 10 system review of systems was obtained and all systems are negative except as noted in the HPI and PMH.      Allergies  Amoxicillin  Home Medications   No current outpatient prescriptions on file. BP 126/74  Pulse 92  Temp(Src) 99 F (37.2 C) (Oral)  Resp 17  Ht 6\' 3"  (1.905 m)  Wt 280 lb (127.007 kg)  BMI 35.00 kg/m2  SpO2 97% Physical Exam  Constitutional: He is oriented to person, place, and time. He appears well-developed and well-nourished. No distress.  HENT:  Head: Normocephalic and atraumatic.  Mouth/Throat: Oropharynx is clear and moist. No oropharyngeal exudate.  Eyes: Conjunctivae and EOM are normal. Pupils are equal, round, and reactive to light.  Neck: Normal range of motion. Neck supple.  Cardiovascular: Normal rate, regular rhythm and normal heart sounds.   No murmur heard. Pulmonary/Chest: Breath sounds normal. No respiratory distress.  Abdominal: Soft. There is no tenderness. There is no rebound and no guarding.  Musculoskeletal: Normal range of motion. He exhibits edema and tenderness.  Circumferential erythema and edema to the right lower leg to the proximal one third of the lower leg. DP and  PT pulse. Erythema, warmth and edema to the right medial thigh  Neurological: He is alert and oriented to person, place, and time. No cranial nerve deficit. He exhibits normal muscle tone. Coordination normal.  Skin: Skin is warm.    ED Course  Procedures (including critical care time) Labs Review Labs Reviewed  CBC WITH DIFFERENTIAL - Abnormal; Notable for the following:    WBC 15.8 (*)    Neutrophils  Relative % 90 (*)    Neutro Abs 14.2 (*)    Lymphocytes Relative 5 (*)    All other components within normal limits  COMPREHENSIVE METABOLIC PANEL - Abnormal; Notable for the following:    Sodium 129 (*)    Chloride 86 (*)    Glucose, Bld 140 (*)    AST 43 (*)    ALT 75 (*)    All other components within normal limits  GLUCOSE, CAPILLARY - Abnormal; Notable for the following:    Glucose-Capillary 155 (*)    All other components within normal limits  GLUCOSE, CAPILLARY - Abnormal; Notable for the following:    Glucose-Capillary 153 (*)    All other components within normal limits  CBC - Abnormal; Notable for the following:    WBC 14.0 (*)    All other components within normal limits  CREATININE, SERUM - Abnormal; Notable for the following:    GFR calc non Af Amer 88 (*)    All other components within normal limits  CG4 I-STAT (LACTIC ACID) - Abnormal; Notable for the following:    Lactic Acid, Venous 4.50 (*)    All other components within normal limits  MRSA PCR SCREENING  CULTURE, BLOOD (ROUTINE X 2)  CULTURE, BLOOD (ROUTINE X 2)  LACTIC ACID, PLASMA  CBC WITH DIFFERENTIAL  BASIC METABOLIC PANEL   Imaging Review Dg Tibia/fibula Right  10/26/2013   CLINICAL DATA:  Right calf swelling and pain  EXAM: RIGHT TIBIA AND FIBULA - 2 VIEW  COMPARISON:  None.  FINDINGS: No acute fracture or dislocation is noted. A small Achilles spur is seen.  IMPRESSION: No acute bony abnormality.   Electronically Signed   By: Inez Catalina M.D.   On: 10/26/2013 18:33      MDM   Final diagnoses:  Cellulitis    History of diabetes with redness, swelling and pain to his right lower extremity. Exam consistent with cellulitis.  Lactate is 4.5. White count is elevated at 15. Patient started on IV antibiotics vancomycin and cefepime. X-ray shows no gas. Doubt necrotizing fasciitis. Intact distal pulses. Compartments soft.  Patient tachycardic to the 110s. He is not appear to be toxic. Given the  degree of cellulitis in his ongoing vital sign abnormalities, we'll admit for IV antibiotics.  BP 126/74  Pulse 92  Temp(Src) 99 F (37.2 C) (Oral)  Resp 17  Ht 6\' 3"  (1.905 m)  Wt 280 lb (127.007 kg)  BMI 35.00 kg/m2  SpO2 97%    Ezequiel Essex, MD 10/26/13 2359

## 2013-10-26 NOTE — Progress Notes (Signed)
PCP: [facility] 38/F, DM with Sepsis, RLE cellulitis, extending to knee, lactic acid 4.5  transfer [facility] ER Vitals stable Accepted to [place], [place]

## 2013-10-26 NOTE — H&P (Signed)
[facility] History and Physical  James [name] [id number] DOB: Nov 11, 1975 DOA: 10/26/2013  Referring physician: Patient was transferred from [facility]. PCP: Pcp Not In System   Chief Complaint: Right lower extremity pain and erythema.  HPI: James [name] is a 38 y.o. male with history of diabetes mellitus type 2 on diet, was recently taken off metformin 6 months ago, bipolar disorder presented to the ER at the [facility] with complaints of pain and swelling and erythema of the right lower extremity. Patient states that he woke up today morning with fever chills and drenching sweats and noticed erythema of the right lower extremity which has gradually spread proximally. Since the symptoms were getting worse and he came to the ER. In the ER patient was found to be febrile and tachycardic with leukocytosis. His erythema was found to be spreading proximally. He has 2 scabs on his right leg. Patient does not recall having any insect bites or any trauma. He had similar symptoms 2 years ago. He has pain on moving his leg but joint motion is not restricted. Patient was started on empiric antibiotics after blood cultures were obtained. Patient lactic acid was high. Patient has been admitted for further workup. X-ray of the right leg does not show any signs of osteomyelitis. Patient otherwise denies any chest pain shortness of breath productive cough nausea vomiting abdominal pain or diarrhea.  Review of Systems: As presented in the history of presenting illness, rest negative.  Past Medical History  Diagnosis Date  . Type II diabetes mellitus ~ 2009    "take Metformin" (08/24/2012)  . Bipolar 1 disorder   . Cellulitis 08/24/2012    "RLE and spot on my right forearm" (08/24/2012)   Past Surgical History  Procedure Laterality Date  . Open anterior shoulder reconstruction  ~ 2003    "right; kept dislocating; cut it open; sewed muscle back together; moved  cartilage around and stapled that" (08/24/2012)  . Foot fracture surgery      "left; scaffold broke" 08/24/2012)   Social History:  reports that he has been smoking Cigarettes.  He has a 25 pack-year smoking history. He has never used smokeless tobacco. He reports that he drinks alcohol. He reports that he does not use illicit drugs. Where does patient live home. Can patient participate in ADLs?  Yes.  Allergies  Allergen Reactions  . Amoxicillin Nausea And Vomiting    Family History:  Family History  Problem Relation Age of Onset  . Diabetic kidney disease Maternal Uncle       Prior to Admission medications   Medication Sig Start Date End Date Taking? Authorizing Provider  asenapine (SAPHRIS) 5 MG SUBL Place 5 mg under the tongue 2 (two) times daily.    Historical Provider, MD  benztropine (COGENTIN) 1 MG tablet Take 1-2 mg by mouth 2 (two) times daily. Take 1 tab in the morning and 2 tabs in the evening     Historical Provider, MD  cyclobenzaprine (FLEXERIL) 10 MG tablet Take 1 tablet (10 mg total) by mouth 3 (three) times daily as needed for muscle spasms. 10/11/13   Idalia Needle. Sanford, PA-C  divalproex (DEPAKOTE ER) 500 MG 24 hr tablet Take 2,000 mg by mouth at bedtime.    Historical Provider, MD  doxycycline (VIBRA-TABS) 100 MG tablet Take 1 tablet (100 mg total) by mouth 2 (two) times daily. 08/28/12   Verlee Monte, MD  FLUoxetine (PROZAC) 40 MG capsule Take 40 mg by mouth daily.  Historical Provider, MD  HYDROcodone-acetaminophen (NORCO/VICODIN) 5-325 MG per tablet Take 1 tablet by mouth every 4 (four) hours as needed for moderate pain. 10/11/13   Idalia Needle. Sanford, PA-C  hydrOXYzine (VISTARIL) 50 MG capsule Take 50 mg by mouth 3 (three) times daily.    Historical Provider, MD  metFORMIN (GLUMETZA) 1000 MG (MOD) 24 hr tablet Take 1,000 mg by mouth daily with breakfast.    Historical Provider, MD  risperidone (RISPERDAL) 4 MG tablet Take 6 mg by mouth at bedtime.    Historical  Provider, MD  traMADol (ULTRAM) 50 MG tablet Take 1 tablet (50 mg total) by mouth every 6 (six) hours as needed. 08/30/13   Veryl Speak, MD    Physical Exam: Filed Vitals:   10/26/13 1628 10/26/13 1827 10/26/13 1941  BP: 130/75 127/71 118/69  Pulse: 116 103 109  Temp: 99.6 F (37.6 C) 98.8 F (37.1 C) 99.9 F (37.7 C)  TempSrc: Oral Oral Oral  Resp: 20 18 18   Height: 6\' 3"  (1.905 m)    Weight: 127.007 kg (280 lb)    SpO2: 98% 95% 97%     General:  Well-developed and nourished.  Eyes:  Anicteric no pallor.  ENT:  No discharge from the ears eyes nose mouth.  Neck:  No mass felt.  Cardiovascular:  S1-S2 heard.  Respiratory:  No rhonchi or crepitations.  Abdomen:  Soft nontender bowel sounds present. No guarding or rigidity.  Skin:  Rash involving the right lower leg anterior shin and distal thigh. There are 2 small scabs on the leg area. There is no active discharge. There is edema of the lower extremity.  Musculoskeletal:  See skin description. Patient is able to move his knee and foot at this time with mild pain. There is no obvious joint effusion.  Psychiatric:  Appears normal.  Neurologic:  Alert awake oriented to time place and person. Moves all extremities.  Labs on Admission:  Basic Metabolic Panel:  Recent Labs Lab 10/26/13 1656  NA 129*  K 4.1  CL 86*  CO2 23  GLUCOSE 140*  BUN 17  CREATININE 1.00  CALCIUM 9.6   Liver Function Tests:  Recent Labs Lab 10/26/13 1656  AST 43*  ALT 75*  ALKPHOS 61  BILITOT 0.8  PROT 7.8  ALBUMIN 3.6   No results found for this basename: LIPASE, AMYLASE,  in the last 168 hours No results found for this basename: AMMONIA,  in the last 168 hours CBC:  Recent Labs Lab 10/26/13 1656  WBC 15.8*  NEUTROABS 14.2*  HGB 16.3  HCT 46.9  MCV 93.2  PLT 182   Cardiac Enzymes: No results found for this basename: CKTOTAL, CKMB, CKMBINDEX, TROPONINI,  in the last 168 hours  BNP (last 3 results) No results  found for this basename: PROBNP,  in the last 8760 hours CBG:  Recent Labs Lab 10/26/13 1953 10/26/13 2142  GLUCAP 155* 153*    Radiological Exams on Admission: Dg Tibia/fibula Right  10/26/2013   CLINICAL DATA:  Right calf swelling and pain  EXAM: RIGHT TIBIA AND FIBULA - 2 VIEW  COMPARISON:  None.  FINDINGS: No acute fracture or dislocation is noted. A small Achilles spur is seen.  IMPRESSION: No acute bony abnormality.   Electronically Signed   By: Inez Catalina M.D.   On: 10/26/2013 18:33     Assessment/Plan Principal Problem:   Sepsis Active Problems:   Diabetes mellitus   Cellulitis   Bipolar affective disorder   1.  Sepsis from cellulitis of the right lower extremity - I have placed patient on vancomycin cefepime. Follow blood cultures. Continue with hydration and pain relief medications. Patient does not recall his last tetanus shot and I have ordered one dose. At this time patient does not have any symptoms or signs to suggest compartment syndrome but we will closely observe. Since patient has a mild right lower extremity I have ordered Doppler of the right lower extremity check for DVT. 2. Diabetes mellitus type 2 on diet - patient states that his last hemoglobin A1c was 6.1 recently. For now I have placed patient on instant sliding-scale coverage. Closely follow CBGs levels. 3. Bipolar disorder - continue present medications. 4. Mildly elevated LFTs - follow LFTs and if not further worsening and further workup as outpatient. 5. Hyponatremia - probably from dehydration. Recheck metabolic panel after hydration. 6. Tobacco abuse - patient advised about quitting smoking.  I have reviewed patient's old charts and labs.  Code Status:  Full code.  Family Communication:  None.  Disposition Plan:  Admit to inpatient.    Harjot Zavadil N. [facility] Pager [phone].  If 7PM-7AM, please contact night-coverage [email or web address] Password [facility] 10/26/2013, 9:53  PM

## 2013-10-26 NOTE — Progress Notes (Addendum)
ANTIBIOTIC CONSULT NOTE - INITIAL  Pharmacy Consult for Vancomycin, Cefepime Indication: cellulitis, sepsis  Allergies  Allergen Reactions  . Amoxicillin Nausea And Vomiting    Patient Measurements: Height: 6\' 3"  (190.5 cm) Weight: 280 lb (127.007 kg) IBW/kg (Calculated) : 84.5  Vital Signs: Temp: 99.9 F (37.7 C) (02/12 1941) Temp src: Oral (02/12 1941) BP: 118/69 mmHg (02/12 1941) Pulse Rate: 109 (02/12 1941) Intake/Output from previous day:   Intake/Output from this shift: Total I/O In: 1200 [IV Piggyback:1200] Out: -   Labs:  Recent Labs  10/26/13 1656  WBC 15.8*  HGB 16.3  PLT 182  CREATININE 1.00   Estimated Creatinine Clearance: 141 ml/min (by C-G formula based on Cr of 1). No results found for this basename: VANCOTROUGH, VANCOPEAK, VANCORANDOM, GENTTROUGH, GENTPEAK, GENTRANDOM, TOBRATROUGH, TOBRAPEAK, TOBRARND, AMIKACINPEAK, AMIKACINTROU, AMIKACIN,  in the last 72 hours   Microbiology: No results found for this or any previous visit (from the past 720 hour(s)).  Medical History: Past Medical History  Diagnosis Date  . Type II diabetes mellitus ~ 2009    "take Metformin" (08/24/2012)  . Bipolar 1 disorder   . Cellulitis 08/24/2012    "RLE and spot on my right forearm" (08/24/2012)    Assessment: 38 year old male to begin vancomycin and cefepime for cellulitis, sepsis.  He has a history of DM.   Goal of Therapy:  Vancomycin trough level 10-15 mcg/ml  Plan:  1) Cefepime 2 Grams iv Q 12 hours 2) Vancomycin 1500 mg iv x 1 now, then 1250 mg iv Q 12 hours 3) Follow up progress, Scr, fever trend.  Thank you. Anette Guarneri, PharmD [phone]  10/26/2013,9:59 PM

## 2013-10-27 DIAGNOSIS — L0291 Cutaneous abscess, unspecified: Secondary | ICD-10-CM | POA: Diagnosis not present

## 2013-10-27 DIAGNOSIS — A419 Sepsis, unspecified organism: Secondary | ICD-10-CM | POA: Diagnosis not present

## 2013-10-27 DIAGNOSIS — F319 Bipolar disorder, unspecified: Secondary | ICD-10-CM | POA: Diagnosis not present

## 2013-10-27 DIAGNOSIS — E119 Type 2 diabetes mellitus without complications: Secondary | ICD-10-CM | POA: Diagnosis not present

## 2013-10-27 LAB — CBC WITH DIFFERENTIAL/PLATELET
Basophils Absolute: 0 10*3/uL (ref 0.0–0.1)
Basophils Relative: 0 % (ref 0–1)
EOS PCT: 0 % (ref 0–5)
Eosinophils Absolute: 0 10*3/uL (ref 0.0–0.7)
HEMATOCRIT: 38.8 % — AB (ref 39.0–52.0)
Hemoglobin: 13.6 g/dL (ref 13.0–17.0)
LYMPHS ABS: 1.2 10*3/uL (ref 0.7–4.0)
LYMPHS PCT: 9 % — AB (ref 12–46)
MCH: 33 pg (ref 26.0–34.0)
MCHC: 35.1 g/dL (ref 30.0–36.0)
MCV: 94.2 fL (ref 78.0–100.0)
MONO ABS: 0.9 10*3/uL (ref 0.1–1.0)
Monocytes Relative: 7 % (ref 3–12)
Neutro Abs: 10.7 10*3/uL — ABNORMAL HIGH (ref 1.7–7.7)
Neutrophils Relative %: 83 % — ABNORMAL HIGH (ref 43–77)
Platelets: 150 10*3/uL (ref 150–400)
RBC: 4.12 MIL/uL — AB (ref 4.22–5.81)
RDW: 12.8 % (ref 11.5–15.5)
WBC: 12.8 10*3/uL — AB (ref 4.0–10.5)

## 2013-10-27 LAB — GLUCOSE, CAPILLARY
GLUCOSE-CAPILLARY: 151 mg/dL — AB (ref 70–99)
GLUCOSE-CAPILLARY: 125 mg/dL — AB (ref 70–99)
GLUCOSE-CAPILLARY: 171 mg/dL — AB (ref 70–99)
Glucose-Capillary: 155 mg/dL — ABNORMAL HIGH (ref 70–99)

## 2013-10-27 LAB — BASIC METABOLIC PANEL
BUN: 17 mg/dL (ref 6–23)
CALCIUM: 8.6 mg/dL (ref 8.4–10.5)
CO2: 26 mEq/L (ref 19–32)
CREATININE: 0.96 mg/dL (ref 0.50–1.35)
Chloride: 91 mEq/L — ABNORMAL LOW (ref 96–112)
GFR calc Af Amer: >90 mL/min (ref >90)
GFR calc non Af Amer: >90 mL/min (ref >90)
GLUCOSE: 152 mg/dL — AB (ref 70–99)
Potassium: 4.3 mEq/L (ref 3.7–5.3)
Sodium: 132 mEq/L — ABNORMAL LOW (ref 137–147)

## 2013-10-27 LAB — LACTIC ACID, PLASMA: Lactic Acid, Venous: 2 mmol/L (ref 0.5–2.2)

## 2013-10-27 NOTE — Progress Notes (Signed)
Utilization review completed. Janette Harvie, RN, BSN. 

## 2013-10-27 NOTE — Progress Notes (Signed)
Patient transferred to [place] room 22.  Patient oriented to unit and room, educated on use of call bell and telephone.  Patient verbalized understanding.   Arm band verified with name and birth date  Arrival method: bed  Orientation: A and Ox4  Telemetry: Patient placed on telemetry box 20, NSR in the 90s.  CCMT notified.   Assessment: see Doc FLowsheets  Safety Measures: Patient educated on fall precautions and wearing yellow socks.

## 2013-10-27 NOTE — Progress Notes (Signed)
Patient being transferred to [facility] per MD order. Report called to [place], Therapist, sports. Patient alert and oriented, VSS. Will transfer patient in wheel chair.

## 2013-10-27 NOTE — Progress Notes (Signed)
[facility] PROGRESS NOTE  James [name] [id number] DOB: Feb 28, 1976 DOA: 10/26/2013 PCP: Pcp Not In System  Assessment/Plan:        1. Sepsis        - RLE cellulitis        -continue Vancomycin/Cefepime        -elevate extremity        -Xray negative         -WBC, lactic acid improved  2. Diabetes mellitus type 2  - patient states that his last hemoglobin A1c was 6.1 -continue SSI  3. Bipolar disorder - continue present medications.  4. Mildly elevated LFTs -repeat in am  5. Tobacco abuse - patient advised about quitting smoking.  Code Status: Full  Code Family Communication: none at bedsdie Disposition Plan: home when improved  Antibiotics: Vanc  HPI/Subjective: feels ok, R leg redness unchanged  Objective: Filed Vitals:   10/27/13 1202  BP:   Pulse:   Temp: 97.8 F (36.6 C)  Resp:     Intake/Output Summary (Last 24 hours) at 10/27/13 1402 Last data filed at 10/27/13 1200  Gross per 24 hour  Intake   3583 ml  Output    900 ml  Net   2683 ml   Filed Weights   10/26/13 1628  Weight: 127.007 kg (280 lb)    Exam:   General:  AAOx3  Cardiovascular: S1S2/RRR  Respiratory: CTAB  Abdomen: soft, NT, BS present  Musculoskeletal: erythema and swelling involving the right lower leg anterior shin and distal thigh. There are 2 small scabs on the leg area. There is no active discharge. There is edema of the lower extremity   Data Reviewed: Basic Metabolic Panel:  Recent Labs Lab 10/26/13 1656 10/26/13 2244 10/27/13 0401  NA 129*  --  132*  K 4.1  --  4.3  CL 86*  --  91*  CO2 23  --  26  GLUCOSE 140*  --  152*  BUN 17  --  17  CREATININE 1.00 1.04 0.96  CALCIUM 9.6  --  8.6   Liver Function Tests:  Recent Labs Lab 10/26/13 1656  AST 43*  ALT 75*  ALKPHOS 61  BILITOT 0.8  PROT 7.8  ALBUMIN 3.6   No results found for this basename: LIPASE, AMYLASE,  in the last 168 hours No results found for this basename: AMMONIA,   in the last 168 hours CBC:  Recent Labs Lab 10/26/13 1656 10/26/13 2244 10/27/13 0401  WBC 15.8* 14.0* 12.8*  NEUTROABS 14.2*  --  10.7*  HGB 16.3 14.2 13.6  HCT 46.9 39.4 38.8*  MCV 93.2 93.1 94.2  PLT 182 153 150   Cardiac Enzymes: No results found for this basename: CKTOTAL, CKMB, CKMBINDEX, TROPONINI,  in the last 168 hours BNP (last 3 results) No results found for this basename: PROBNP,  in the last 8760 hours CBG:  Recent Labs Lab 10/26/13 1953 10/26/13 2142 10/27/13 0815 10/27/13 1205  GLUCAP 155* 153* 151* 125*    Recent Results (from the past 240 hour(s))  CULTURE, BLOOD (ROUTINE X 2)     Status: None   Collection Time    10/26/13  4:43 PM      Result Value Ref Range Status   Specimen Description BLOOD RIGHT ANTECUBITAL   Final   Special Requests NONE BOTTLES DRAWN AEROBIC AND ANAEROBIC [facility] EACH   Final   Culture  Setup Time     Final   Value: 10/26/2013 19:58  Performed at [facility]     Final   Value:        BLOOD CULTURE RECEIVED NO GROWTH TO DATE CULTURE WILL BE HELD FOR 5 DAYS BEFORE ISSUING A FINAL NEGATIVE REPORT     Performed at [facility]   Report Status PENDING   Incomplete  CULTURE, BLOOD (ROUTINE X 2)     Status: None   Collection Time    10/26/13  4:59 PM      Result Value Ref Range Status   Specimen Description BLOOD RIGHT HAND   Final   Special Requests NONE BOTTLES DRAWN AEROBIC AND ANAEROBIC 5CC EACH   Final   Culture  Setup Time     Final   Value: 10/26/2013 19:58     Performed at [facility]   Culture     Final   Value:        BLOOD CULTURE RECEIVED NO GROWTH TO DATE CULTURE WILL BE HELD FOR 5 DAYS BEFORE ISSUING A FINAL NEGATIVE REPORT     Performed at [facility]   Report Status PENDING   Incomplete  MRSA PCR SCREENING     Status: None   Collection Time    10/26/13  9:23 PM      Result Value Ref Range Status   MRSA by PCR NEGATIVE  NEGATIVE Final   Comment:            The  GeneXpert MRSA Assay (FDA     approved for NASAL specimens     only), is one component of a     comprehensive MRSA colonization     surveillance program. It is not     intended to diagnose MRSA     infection nor to guide or     monitor treatment for     MRSA infections.     Studies: Dg Tibia/fibula Right  10/26/2013   CLINICAL DATA:  Right calf swelling and pain  EXAM: RIGHT TIBIA AND FIBULA - 2 VIEW  COMPARISON:  None.  FINDINGS: No acute fracture or dislocation is noted. A small Achilles spur is seen.  IMPRESSION: No acute bony abnormality.   Electronically Signed   By: Inez Catalina M.D.   On: 10/26/2013 18:33    Scheduled Meds: . asenapine  5 mg Sublingual BID  . benztropine  1 mg Oral Daily  . benztropine  2 mg Oral QHS  . ceFEPime (MAXIPIME) IV  2 g Intravenous Q12H  . divalproex  2,000 mg Oral QHS  . enoxaparin (LOVENOX) injection  60 mg Subcutaneous Q24H  . FLUoxetine  40 mg Oral Daily  . influenza vac split quadrivalent PF  0.5 mL Intramuscular Tomorrow-1000  . insulin aspart  0-9 Units Subcutaneous TID WC  . pneumococcal 23 valent vaccine  0.5 mL Intramuscular Tomorrow-1000  . risperidone  6 mg Oral QHS  . sodium chloride  3 mL Intravenous Q12H  . vancomycin  1,250 mg Intravenous Q12H   Continuous Infusions: . sodium chloride 100 mL (10/26/13 2200)    Principal Problem:   Sepsis Active Problems:   Diabetes mellitus   Cellulitis   Bipolar affective disorder    Time spent: 65min    Conor Lata  [facility] Pager [phone]. If 7PM-7AM, please contact night-coverage at [email or web address], password [facility] 10/27/2013, 2:02 PM  LOS: 1 day

## 2013-10-28 DIAGNOSIS — L0291 Cutaneous abscess, unspecified: Secondary | ICD-10-CM | POA: Diagnosis not present

## 2013-10-28 DIAGNOSIS — A419 Sepsis, unspecified organism: Secondary | ICD-10-CM | POA: Diagnosis not present

## 2013-10-28 DIAGNOSIS — E119 Type 2 diabetes mellitus without complications: Secondary | ICD-10-CM | POA: Diagnosis not present

## 2013-10-28 DIAGNOSIS — F319 Bipolar disorder, unspecified: Secondary | ICD-10-CM | POA: Diagnosis not present

## 2013-10-28 LAB — BASIC METABOLIC PANEL
BUN: 11 mg/dL (ref 6–23)
CHLORIDE: 98 meq/L (ref 96–112)
CO2: 26 meq/L (ref 19–32)
Calcium: 9.2 mg/dL (ref 8.4–10.5)
Creatinine, Ser: 0.8 mg/dL (ref 0.50–1.35)
GFR calc Af Amer: >90 mL/min (ref >90)
GFR calc non Af Amer: >90 mL/min (ref >90)
GLUCOSE: 130 mg/dL — AB (ref 70–99)
Potassium: 4.3 mEq/L (ref 3.7–5.3)
Sodium: 136 mEq/L — ABNORMAL LOW (ref 137–147)

## 2013-10-28 LAB — GLUCOSE, CAPILLARY
GLUCOSE-CAPILLARY: 130 mg/dL — AB (ref 70–99)
GLUCOSE-CAPILLARY: 128 mg/dL — AB (ref 70–99)
GLUCOSE-CAPILLARY: 129 mg/dL — AB (ref 70–99)
GLUCOSE-CAPILLARY: 134 mg/dL — AB (ref 70–99)

## 2013-10-28 LAB — CBC
HCT: 35.2 % — ABNORMAL LOW (ref 39.0–52.0)
HEMOGLOBIN: 12.4 g/dL — AB (ref 13.0–17.0)
MCH: 33.6 pg (ref 26.0–34.0)
MCHC: 35.2 g/dL (ref 30.0–36.0)
MCV: 95.4 fL (ref 78.0–100.0)
Platelets: 145 10*3/uL — ABNORMAL LOW (ref 150–400)
RBC: 3.69 MIL/uL — AB (ref 4.22–5.81)
RDW: 12.9 % (ref 11.5–15.5)
WBC: 9.9 10*3/uL (ref 4.0–10.5)

## 2013-10-28 LAB — HEMOGLOBIN A1C
HEMOGLOBIN A1C: 6.2 % — AB (ref <5.7)
MEAN PLASMA GLUCOSE: 131 mg/dL — AB (ref <117)

## 2013-10-28 MED ORDER — FUROSEMIDE 10 MG/ML IJ SOLN
20.0000 mg | Freq: Once | INTRAMUSCULAR | Status: AC
  Administered 2013-10-28: 20 mg via INTRAVENOUS
  Filled 2013-10-28: qty 2

## 2013-10-28 NOTE — Progress Notes (Signed)
[facility] PROGRESS NOTE  James [name] [id number] DOB: 1976/04/03 DOA: 10/26/2013 PCP: Pcp Not In System  Assessment/Plan:        1. Sepsis           -improving        - RLE cellulitis        -continue Vancomycin/Cefepime        -elevate extremity        -Xray negative         -WBC, lactic acid improved  2. Diabetes mellitus type 2  - patient states that his last hemoglobin A1c was 6.1, repeat pending -continue SSI  3. Bipolar disorder - continue present medications.  4. Mildly elevated LFTs -repeat in am  5. Tobacco abuse - patient advised about quitting smoking.  Code Status: Full  Code Family Communication: none at bedsdie Disposition Plan: home when improved  Antibiotics: Vanc  HPI/Subjective: feels ok, R leg redness improving  Objective: Filed Vitals:   10/28/13 1000  BP: 131/71  Pulse: 87  Temp: 99.4 F (37.4 C)  Resp: 20    Intake/Output Summary (Last 24 hours) at 10/28/13 1336 Last data filed at 10/28/13 0900  Gross per 24 hour  Intake   1080 ml  Output   3120 ml  Net  -2040 ml   Filed Weights   10/26/13 1628 10/27/13 2026  Weight: 127.007 kg (280 lb) 127.007 kg (280 lb)    Exam:   General:  AAOx3  Cardiovascular: S1S2/RRR  Respiratory: CTAB  Abdomen: soft, NT, BS present  Musculoskeletal: erythema and swelling involving the right lower leg anterior shin and distal thigh. There are 2 small scabs on the leg area. There is no active discharge. There is edema of the lower extremity   Data Reviewed: Basic Metabolic Panel:  Recent Labs Lab 10/26/13 1656 10/26/13 2244 10/27/13 0401 10/28/13 0330  NA 129*  --  132* 136*  K 4.1  --  4.3 4.3  CL 86*  --  91* 98  CO2 23  --  26 26  GLUCOSE 140*  --  152* 130*  BUN 17  --  17 11  CREATININE 1.00 1.04 0.96 0.80  CALCIUM 9.6  --  8.6 9.2   Liver Function Tests:  Recent Labs Lab 10/26/13 1656  AST 43*  ALT 75*  ALKPHOS 61  BILITOT 0.8  PROT 7.8  ALBUMIN 3.6    No results found for this basename: LIPASE, AMYLASE,  in the last 168 hours No results found for this basename: AMMONIA,  in the last 168 hours CBC:  Recent Labs Lab 10/26/13 1656 10/26/13 2244 10/27/13 0401 10/28/13 0330  WBC 15.8* 14.0* 12.8* 9.9  NEUTROABS 14.2*  --  10.7*  --   HGB 16.3 14.2 13.6 12.4*  HCT 46.9 39.4 38.8* 35.2*  MCV 93.2 93.1 94.2 95.4  PLT 182 153 150 145*   Cardiac Enzymes: No results found for this basename: CKTOTAL, CKMB, CKMBINDEX, TROPONINI,  in the last 168 hours BNP (last 3 results) No results found for this basename: PROBNP,  in the last 8760 hours CBG:  Recent Labs Lab 10/27/13 1205 10/27/13 1616 10/27/13 2030 10/28/13 0821 10/28/13 1224  GLUCAP 125* 155* 171* 130* 128*    Recent Results (from the past 240 hour(s))  CULTURE, BLOOD (ROUTINE X 2)     Status: None   Collection Time    10/26/13  4:43 PM      Result Value Ref Range Status   Specimen  Description BLOOD RIGHT ANTECUBITAL   Final   Special Requests NONE BOTTLES DRAWN AEROBIC AND ANAEROBIC [facility] EACH   Final   Culture  Setup Time     Final   Value: 10/26/2013 19:58     Performed at [facility]   Culture     Final   Value:        BLOOD CULTURE RECEIVED NO GROWTH TO DATE CULTURE WILL BE HELD FOR 5 DAYS BEFORE ISSUING A FINAL NEGATIVE REPORT     Performed at [facility]   Report Status PENDING   Incomplete  CULTURE, BLOOD (ROUTINE X 2)     Status: None   Collection Time    10/26/13  4:59 PM      Result Value Ref Range Status   Specimen Description BLOOD RIGHT HAND   Final   Special Requests NONE BOTTLES DRAWN AEROBIC AND ANAEROBIC 5CC EACH   Final   Culture  Setup Time     Final   Value: 10/26/2013 19:58     Performed at [facility]   Culture     Final   Value:        BLOOD CULTURE RECEIVED NO GROWTH TO DATE CULTURE WILL BE HELD FOR 5 DAYS BEFORE ISSUING A FINAL NEGATIVE REPORT     Performed at [facility]   Report Status PENDING    Incomplete  MRSA PCR SCREENING     Status: None   Collection Time    10/26/13  9:23 PM      Result Value Ref Range Status   MRSA by PCR NEGATIVE  NEGATIVE Final   Comment:            The GeneXpert MRSA Assay (FDA     approved for NASAL specimens     only), is one component of a     comprehensive MRSA colonization     surveillance program. It is not     intended to diagnose MRSA     infection nor to guide or     monitor treatment for     MRSA infections.     Studies: Dg Tibia/fibula Right  10/26/2013   CLINICAL DATA:  Right calf swelling and pain  EXAM: RIGHT TIBIA AND FIBULA - 2 VIEW  COMPARISON:  None.  FINDINGS: No acute fracture or dislocation is noted. A small Achilles spur is seen.  IMPRESSION: No acute bony abnormality.   Electronically Signed   By: Inez Catalina M.D.   On: 10/26/2013 18:33    Scheduled Meds: . asenapine  5 mg Sublingual BID  . benztropine  1 mg Oral Daily  . benztropine  2 mg Oral QHS  . ceFEPime (MAXIPIME) IV  2 g Intravenous Q12H  . divalproex  2,000 mg Oral QHS  . enoxaparin (LOVENOX) injection  60 mg Subcutaneous Q24H  . FLUoxetine  40 mg Oral Daily  . insulin aspart  0-9 Units Subcutaneous TID WC  . risperidone  6 mg Oral QHS  . sodium chloride  3 mL Intravenous Q12H  . vancomycin  1,250 mg Intravenous Q12H   Continuous Infusions:    Principal Problem:   Sepsis Active Problems:   Diabetes mellitus   Cellulitis   Bipolar affective disorder    Time spent: 20min    [name]  [facility] Pager [phone]. If 7PM-7AM, please contact night-coverage at [email or web address], password [facility] 10/28/2013, 1:36 PM  LOS: 2 days

## 2013-10-29 DIAGNOSIS — L0291 Cutaneous abscess, unspecified: Secondary | ICD-10-CM | POA: Diagnosis not present

## 2013-10-29 DIAGNOSIS — R509 Fever, unspecified: Secondary | ICD-10-CM

## 2013-10-29 DIAGNOSIS — E119 Type 2 diabetes mellitus without complications: Secondary | ICD-10-CM | POA: Diagnosis not present

## 2013-10-29 DIAGNOSIS — F319 Bipolar disorder, unspecified: Secondary | ICD-10-CM | POA: Diagnosis not present

## 2013-10-29 LAB — BASIC METABOLIC PANEL
BUN: 8 mg/dL (ref 6–23)
CO2: 23 mEq/L (ref 19–32)
Calcium: 8.7 mg/dL (ref 8.4–10.5)
Chloride: 95 mEq/L — ABNORMAL LOW (ref 96–112)
Creatinine, Ser: 0.77 mg/dL (ref 0.50–1.35)
GLUCOSE: 107 mg/dL — AB (ref 70–99)
Potassium: 4 mEq/L (ref 3.7–5.3)
SODIUM: 135 meq/L — AB (ref 137–147)

## 2013-10-29 LAB — CBC
HCT: 35.5 % — ABNORMAL LOW (ref 39.0–52.0)
Hemoglobin: 12.5 g/dL — ABNORMAL LOW (ref 13.0–17.0)
MCH: 33.5 pg (ref 26.0–34.0)
MCHC: 35.2 g/dL (ref 30.0–36.0)
MCV: 95.2 fL (ref 78.0–100.0)
PLATELETS: 168 10*3/uL (ref 150–400)
RBC: 3.73 MIL/uL — ABNORMAL LOW (ref 4.22–5.81)
RDW: 12.9 % (ref 11.5–15.5)
WBC: 9.9 10*3/uL (ref 4.0–10.5)

## 2013-10-29 LAB — GLUCOSE, CAPILLARY
Glucose-Capillary: 118 mg/dL — ABNORMAL HIGH (ref 70–99)
Glucose-Capillary: 133 mg/dL — ABNORMAL HIGH (ref 70–99)
Glucose-Capillary: 172 mg/dL — ABNORMAL HIGH (ref 70–99)
Glucose-Capillary: 110 mg/dL — ABNORMAL HIGH (ref 70–99)

## 2013-10-29 MED ORDER — FUROSEMIDE 10 MG/ML IJ SOLN
20.0000 mg | Freq: Once | INTRAMUSCULAR | Status: AC
  Administered 2013-10-29: 20 mg via INTRAVENOUS
  Filled 2013-10-29: qty 2

## 2013-10-29 NOTE — Progress Notes (Signed)
[facility] PROGRESS NOTE  James [name] [id number] DOB: 08/14/1976 DOA: 10/26/2013 PCP: Pcp Not In System  Assessment/Plan:        1. Sepsis / RLE cellulitis        -improving        -continue Vancomycin/Cefepime        -elevate extremity        -lasix x1 for edema        -Xray negative         -WBC, lactic acid improved  2. Diabetes mellitus type 2  - patient states that his last hemoglobin A1c was 6.1, repeat 6.2 -continue SSI  3. Bipolar disorder - continue present medications.  4. Mildly elevated LFTs -repeat in am  5. Tobacco abuse - patient advised about quitting smoking.  Code Status: Full  Code Family Communication: none at bedsdie Disposition Plan: home when improved  Antibiotics: Vanc  HPI/Subjective: feels ok, R leg redness improving  Objective: Filed Vitals:   10/29/13 1000  BP: 142/92  Pulse: 92  Temp: 99.5 F (37.5 C)  Resp: 20    Intake/Output Summary (Last 24 hours) at 10/29/13 1441 Last data filed at 10/29/13 0900  Gross per 24 hour  Intake   1550 ml  Output   2475 ml  Net   -925 ml   Filed Weights   10/26/13 1628 10/27/13 2026  Weight: 127.007 kg (280 lb) 127.007 kg (280 lb)    Exam:   General:  AAOx3  Cardiovascular: S1S2/RRR  Respiratory: CTAB  Abdomen: soft, NT, BS present  Musculoskeletal: erythema and swelling involving the right lower leg anterior shin and distal thigh. There are 2 small scabs on the leg area. There is no active discharge. There is edema of the lower extremity   Data Reviewed: Basic Metabolic Panel:  Recent Labs Lab 10/26/13 1656 10/26/13 2244 10/27/13 0401 10/28/13 0330 10/29/13 0635  NA 129*  --  132* 136* 135*  K 4.1  --  4.3 4.3 4.0  CL 86*  --  91* 98 95*  CO2 23  --  26 26 23   GLUCOSE 140*  --  152* 130* 107*  BUN 17  --  17 11 8   CREATININE 1.00 1.04 0.96 0.80 0.77  CALCIUM 9.6  --  8.6 9.2 8.7   Liver Function Tests:  Recent Labs Lab 10/26/13 1656  AST 43*   ALT 75*  ALKPHOS 61  BILITOT 0.8  PROT 7.8  ALBUMIN 3.6   No results found for this basename: LIPASE, AMYLASE,  in the last 168 hours No results found for this basename: AMMONIA,  in the last 168 hours CBC:  Recent Labs Lab 10/26/13 1656 10/26/13 2244 10/27/13 0401 10/28/13 0330 10/29/13 0635  WBC 15.8* 14.0* 12.8* 9.9 9.9  NEUTROABS 14.2*  --  10.7*  --   --   HGB 16.3 14.2 13.6 12.4* 12.5*  HCT 46.9 39.4 38.8* 35.2* 35.5*  MCV 93.2 93.1 94.2 95.4 95.2  PLT 182 153 150 145* 168   Cardiac Enzymes: No results found for this basename: CKTOTAL, CKMB, CKMBINDEX, TROPONINI,  in the last 168 hours BNP (last 3 results) No results found for this basename: PROBNP,  in the last 8760 hours CBG:  Recent Labs Lab 10/28/13 1224 10/28/13 1638 10/28/13 2100 10/29/13 0734 10/29/13 1213  GLUCAP 128* 129* 134* 118* 133*    Recent Results (from the past 240 hour(s))  CULTURE, BLOOD (ROUTINE X 2)     Status: None  Collection Time    10/26/13  4:43 PM      Result Value Ref Range Status   Specimen Description BLOOD RIGHT ANTECUBITAL   Final   Special Requests NONE BOTTLES DRAWN AEROBIC AND ANAEROBIC [facility] EACH   Final   Culture  Setup Time     Final   Value: 10/26/2013 19:58     Performed at [facility]   Culture     Final   Value:        BLOOD CULTURE RECEIVED NO GROWTH TO DATE CULTURE WILL BE HELD FOR 5 DAYS BEFORE ISSUING A FINAL NEGATIVE REPORT     Performed at [facility]   Report Status PENDING   Incomplete  CULTURE, BLOOD (ROUTINE X 2)     Status: None   Collection Time    10/26/13  4:59 PM      Result Value Ref Range Status   Specimen Description BLOOD RIGHT HAND   Final   Special Requests NONE BOTTLES DRAWN AEROBIC AND ANAEROBIC 5CC EACH   Final   Culture  Setup Time     Final   Value: 10/26/2013 19:58     Performed at [facility]   Culture     Final   Value:        BLOOD CULTURE RECEIVED NO GROWTH TO DATE CULTURE WILL BE HELD FOR 5 DAYS  BEFORE ISSUING A FINAL NEGATIVE REPORT     Performed at [facility]   Report Status PENDING   Incomplete  MRSA PCR SCREENING     Status: None   Collection Time    10/26/13  9:23 PM      Result Value Ref Range Status   MRSA by PCR NEGATIVE  NEGATIVE Final   Comment:            The GeneXpert MRSA Assay (FDA     approved for NASAL specimens     only), is one component of a     comprehensive MRSA colonization     surveillance program. It is not     intended to diagnose MRSA     infection nor to guide or     monitor treatment for     MRSA infections.     Studies: No results found.  Scheduled Meds: . asenapine  5 mg Sublingual BID  . benztropine  1 mg Oral Daily  . benztropine  2 mg Oral QHS  . ceFEPime (MAXIPIME) IV  2 g Intravenous Q12H  . divalproex  2,000 mg Oral QHS  . enoxaparin (LOVENOX) injection  60 mg Subcutaneous Q24H  . FLUoxetine  40 mg Oral Daily  . furosemide  20 mg Intravenous Once  . insulin aspart  0-9 Units Subcutaneous TID WC  . risperidone  6 mg Oral QHS  . sodium chloride  3 mL Intravenous Q12H  . vancomycin  1,250 mg Intravenous Q12H   Continuous Infusions:    Principal Problem:   Sepsis Active Problems:   Diabetes mellitus   Cellulitis   Bipolar affective disorder    Time spent: 18min    Yul Diana  [facility] Pager [phone]. If 7PM-7AM, please contact night-coverage at [email or web address], password [facility] 10/29/2013, 2:41 PM  LOS: 3 days

## 2013-10-29 NOTE — Progress Notes (Signed)
ANTIBIOTIC CONSULT NOTE - Follow Up  Pharmacy Consult for Vancomycin, Cefepime Indication: cellulitis, sepsis  Allergies  Allergen Reactions  . Amoxicillin Nausea And Vomiting   Patient Measurements: Height: 6\' 3"  (190.5 cm) Weight: 280 lb (127.007 kg) IBW/kg (Calculated) : 84.5  Vital Signs: Temp: 99.5 F (37.5 C) (02/15 1000) Temp src: Oral (02/15 1000) BP: 142/92 mmHg (02/15 1000) Pulse Rate: 92 (02/15 1000) Intake/Output from previous day: 02/14 0701 - 02/15 0700 In: 1670 [P.O.:720; IV Piggyback:950] Out: 2375 [Urine:2375] Intake/Output from this shift: Total I/O In: 360 [P.O.:360] Out: 950 [Urine:950]  Labs:  Recent Labs  10/27/13 0401 10/28/13 0330 10/29/13 0635  WBC 12.8* 9.9 9.9  HGB 13.6 12.4* 12.5*  PLT 150 145* 168  CREATININE 0.96 0.80 0.77   Estimated Creatinine Clearance: 176.2 ml/min (by C-G formula based on Cr of 0.77). No results found for this basename: VANCOTROUGH, VANCOPEAK, VANCORANDOM, GENTTROUGH, GENTPEAK, GENTRANDOM, TOBRATROUGH, TOBRAPEAK, TOBRARND, AMIKACINPEAK, AMIKACINTROU, AMIKACIN,  in the last 72 hours   Microbiology: Recent Results (from the past 720 hour(s))  CULTURE, BLOOD (ROUTINE X 2)     Status: None   Collection Time    10/26/13  4:43 PM      Result Value Ref Range Status   Specimen Description BLOOD RIGHT ANTECUBITAL   Final   Special Requests NONE BOTTLES DRAWN AEROBIC AND ANAEROBIC 5CC EACH   Final   Culture  Setup Time     Final   Value: 10/26/2013 19:58     Performed at [facility]   Culture     Final   Value:        BLOOD CULTURE RECEIVED NO GROWTH TO DATE CULTURE WILL BE HELD FOR 5 DAYS BEFORE ISSUING A FINAL NEGATIVE REPORT     Performed at [facility]   Report Status PENDING   Incomplete  CULTURE, BLOOD (ROUTINE X 2)     Status: None   Collection Time    10/26/13  4:59 PM      Result Value Ref Range Status   Specimen Description BLOOD RIGHT HAND   Final   Special Requests NONE BOTTLES  DRAWN AEROBIC AND ANAEROBIC 5CC EACH   Final   Culture  Setup Time     Final   Value: 10/26/2013 19:58     Performed at [facility]   Culture     Final   Value:        BLOOD CULTURE RECEIVED NO GROWTH TO DATE CULTURE WILL BE HELD FOR 5 DAYS BEFORE ISSUING A FINAL NEGATIVE REPORT     Performed at [facility]   Report Status PENDING   Incomplete  MRSA PCR SCREENING     Status: None   Collection Time    10/26/13  9:23 PM      Result Value Ref Range Status   MRSA by PCR NEGATIVE  NEGATIVE Final   Comment:            The GeneXpert MRSA Assay (FDA     approved for NASAL specimens     only), is one component of a     comprehensive MRSA colonization     surveillance program. It is not     intended to diagnose MRSA     infection nor to guide or     monitor treatment for     MRSA infections.   Medical History: Past Medical History  Diagnosis Date  . Type II diabetes mellitus ~ 2009    "  take Metformin" (08/24/2012)  . Bipolar 1 disorder   . Cellulitis 08/24/2012    "RLE and spot on my right forearm" (08/24/2012)   Assessment: 38 year old male receiving vancomycin and cefepime for cellulitis, sepsis.  He has a history of DM. He has tolerated dosing without noted complications.  His renal function remains stable with creatinine = 0.77 and an estimated crcl of 14ml/min.  Goal of Therapy:  Vancomycin trough level 10-15 mcg/ml  Plan:  1) Continue Cefepime 2 Grams iv Q 12 hours 2) Continue Vancomycin 1250 mg iv Q 12 hours 3) Follow up progress, Scr, fever trend.  Rober Minion, PharmD., MS Clinical Pharmacist Pager:  [phone] Thank you for allowing pharmacy to be part of this patients care team. 10/29/2013,3:17 PM

## 2013-10-30 DIAGNOSIS — L0291 Cutaneous abscess, unspecified: Secondary | ICD-10-CM | POA: Diagnosis not present

## 2013-10-30 DIAGNOSIS — F319 Bipolar disorder, unspecified: Secondary | ICD-10-CM | POA: Diagnosis not present

## 2013-10-30 DIAGNOSIS — E119 Type 2 diabetes mellitus without complications: Secondary | ICD-10-CM | POA: Diagnosis not present

## 2013-10-30 DIAGNOSIS — R509 Fever, unspecified: Secondary | ICD-10-CM | POA: Diagnosis not present

## 2013-10-30 LAB — GLUCOSE, CAPILLARY
GLUCOSE-CAPILLARY: 113 mg/dL — AB (ref 70–99)
GLUCOSE-CAPILLARY: 136 mg/dL — AB (ref 70–99)

## 2013-10-30 LAB — BASIC METABOLIC PANEL
BUN: 11 mg/dL (ref 6–23)
CO2: 26 mEq/L (ref 19–32)
CREATININE: 0.76 mg/dL (ref 0.50–1.35)
Calcium: 9.2 mg/dL (ref 8.4–10.5)
Chloride: 94 mEq/L — ABNORMAL LOW (ref 96–112)
GFR calc Af Amer: >90 mL/min (ref >90)
GLUCOSE: 113 mg/dL — AB (ref 70–99)
POTASSIUM: 4.1 meq/L (ref 3.7–5.3)
Sodium: 133 mEq/L — ABNORMAL LOW (ref 137–147)

## 2013-10-30 MED ORDER — AMOXICILLIN-POT CLAVULANATE 500-125 MG PO TABS: 1.0000 | ORAL_TABLET | Freq: Three times a day (TID) | ORAL | Status: DC

## 2013-10-30 MED ORDER — LEVOFLOXACIN 500 MG PO TABS: 500.0000 mg | ORAL_TABLET | Freq: Every day | ORAL | Status: DC

## 2013-10-30 MED ORDER — DOXYCYCLINE HYCLATE 100 MG PO TBEC
100.0000 mg | DELAYED_RELEASE_TABLET | Freq: Two times a day (BID) | ORAL | Status: DC
Start: 2013-10-30 — End: 2014-02-04

## 2013-10-30 NOTE — Progress Notes (Signed)
Patient discharged to home. Patient AVS reviewed. Patient capable verbalized understanding of medications and follow-up appointments.  Patient remains stable; no signs or symptoms of distress.  Patient educated to return to the ER in cases of SOB, dizziness, fever, chest pain, or fainting.

## 2013-11-01 LAB — CULTURE, BLOOD (ROUTINE X 2)
CULTURE: NO GROWTH
Culture: NO GROWTH

## 2013-11-05 NOTE — Discharge Summary (Addendum)
Physician Discharge Summary  CARLOS UPADHYAYA [id number] DOB: 11/04/1975 DOA: 10/26/2013  PCP: Pcp Not In System  Admit date: 10/26/2013 Discharge date: 10/30/2013  Time spent: 45 minutes  Recommendations for Outpatient Follow-up:  1. PCP in 1 week  Discharge Diagnoses:  Principal Problem:   Sepsis Active Problems:   Diabetes mellitus   Cellulitis   Bipolar affective disorder   Tobacco use disorder   Discharge Condition: improved  Diet recommendation: carb modified  Filed Weights   10/26/13 1628 10/27/13 2026  Weight: 127.007 kg (280 lb) 127.007 kg (280 lb)    History of present illness:  Chief Complaint: Right lower extremity pain and erythema.  HPI: James [name] is a 38 y.o. male with history of diabetes mellitus type 2 on diet, was recently taken off metformin 6 months ago, bipolar disorder presented to the ER at the [facility] with complaints of pain and swelling and erythema of the right lower extremity. Patient states that he woke up today morning with fever chills and drenching sweats and noticed erythema of the right lower extremity which has gradually spread proximally. Since the symptoms were getting worse and he came to the ER. In the ER patient was found to be febrile and tachycardic with leukocytosis. His erythema was found to be spreading proximally. He has 2 scabs on his right leg. Patient does not recall having any insect bites or any trauma. He had similar symptoms 2 years ago. He has pain on moving his leg but joint motion is not restricted. Patient was started on empiric antibiotics after blood cultures were obtained. Patient lactic acid was high. Patient has been admitted for further workup.   Hospital Course:  1. Sepsis / RLE cellulitis  -improving  -treated with IV Vancomycin/Cefepime initially with good clinical improvement. -elevation of extremity recommended -Xray negative for osteomyelitis -WBC, lactic acid improved   -transitioned to oral antibiotics at discharge -advised to FU with PCP in 1 week  2. Diabetes mellitus type 2  - patient states that his last hemoglobin A1c was 6.1, repeat 6.2  - controlled  3. Bipolar disorder - continue present medications.   4. Mildly elevated LFTs  -Fu with PCP  5. Tobacco abuse - patient advised about quitting smoking     Discharge Exam: Filed Vitals:   10/30/13 0951  BP: 125/78  Pulse: 89  Temp: 98.8 F (37.1 C)  Resp: 18    General: AAOx3 Cardiovascular: S1S2/RRR Respiratory: CTAB  Discharge Instructions  Discharge Orders   Future Orders Complete By Expires   Diet Carb Modified  As directed    Increase activity slowly  As directed        Medication List         benztropine 1 MG tablet  Commonly known as:  COGENTIN  Take 1-2 mg by mouth 2 (two) times daily. Take 1 tab in the morning and 2 tabs in the evening     divalproex 500 MG 24 hr tablet  Commonly known as:  DEPAKOTE ER  Take 2,000 mg by mouth at bedtime.     doxycycline 100 MG EC tablet  Commonly known as:  DORYX  Take 1 tablet (100 mg total) by mouth 2 (two) times daily. For 6 days     esomeprazole 20 MG capsule  Commonly known as:  NEXIUM  Take 20 mg by mouth daily at 12 noon.     FLUoxetine 40 MG capsule  Commonly known as:  PROZAC  Take 40 mg  by mouth daily.     levofloxacin 500 MG tablet  Commonly known as:  LEVAQUIN  Take 1 tablet (500 mg total) by mouth daily. For 6 days     risperidone 4 MG tablet  Commonly known as:  RISPERDAL  Take 4 mg by mouth at bedtime.     SAPHRIS 5 MG Subl 24 hr tablet  Generic drug:  asenapine  Place 5 mg under the tongue 2 (two) times daily.       Allergies  Allergen Reactions  . Amoxicillin Nausea And Vomiting       Follow-up Information   Follow up with Pcp Not In System. Schedule an appointment as soon as possible for a visit in 1 week.       The results of significant diagnostics from this hospitalization  (including imaging, microbiology, ancillary and laboratory) are listed below for reference.    Significant Diagnostic Studies: Dg Tibia/fibula Right  10/26/2013   CLINICAL DATA:  Right calf swelling and pain  EXAM: RIGHT TIBIA AND FIBULA - 2 VIEW  COMPARISON:  None.  FINDINGS: No acute fracture or dislocation is noted. A small Achilles spur is seen.  IMPRESSION: No acute bony abnormality.   Electronically Signed   By: Inez Catalina M.D.   On: 10/26/2013 18:33   Ct Cervical Spine Wo Contrast  10/11/2013   CLINICAL DATA:  Neck pain and stiffness  EXAM: CT CERVICAL SPINE WITHOUT CONTRAST  TECHNIQUE: Multidetector CT imaging of the cervical spine was performed without intravenous contrast. Multiplanar CT image reconstructions were also generated.  COMPARISON:  None.  FINDINGS: The alignment is anatomic. The vertebral body heights are maintained. There is no acute fracture. There is no static listhesis. The prevertebral soft tissues are normal. The intraspinal soft tissues are not fully imaged on this examination due to poor soft tissue contrast, but there is no gross soft tissue abnormality.  The disc spaces are maintained. There is a broad-based disc osteophyte complex with impression upon the ventral thecal sac at C5-6. There is a broad-based disc osteophyte complex impressing on the ventral thecal sac at C6-7.  The visualized portions of the lung apices demonstrate no focal abnormality.  IMPRESSION: 1. Broad-based disc osteophyte complexes at C5-6 and C6-7 with mild impression upon the ventral thecal sac. 2. No acute osseous injury of the cervical spine.   Electronically Signed   By: Kathreen Devoid   On: 10/11/2013 13:14    Microbiology: No results found for this or any previous visit (from the past 240 hour(s)).   Labs: Basic Metabolic Panel:  Recent Labs Lab 10/30/13 0706  NA 133*  K 4.1  CL 94*  CO2 26  GLUCOSE 113*  BUN 11  CREATININE 0.76  CALCIUM 9.2   Liver Function Tests: No results  found for this basename: AST, ALT, ALKPHOS, BILITOT, PROT, ALBUMIN,  in the last 168 hours No results found for this basename: LIPASE, AMYLASE,  in the last 168 hours No results found for this basename: AMMONIA,  in the last 168 hours CBC: No results found for this basename: WBC, NEUTROABS, HGB, HCT, MCV, PLT,  in the last 168 hours Cardiac Enzymes: No results found for this basename: CKTOTAL, CKMB, CKMBINDEX, TROPONINI,  in the last 168 hours BNP: BNP (last 3 results) No results found for this basename: PROBNP,  in the last 8760 hours CBG:  Recent Labs Lab 10/30/13 0758 10/30/13 1124  GLUCAP 113* 136*       Signed:  Domenic Polite  [facility] 11/05/2013, 10:04 PM

## 2013-12-14 DIAGNOSIS — M5030 Other cervical disc degeneration, unspecified cervical region: Secondary | ICD-10-CM | POA: Diagnosis not present

## 2014-02-04 ENCOUNTER — Inpatient Hospital Stay (HOSPITAL_COMMUNITY)
Admission: EM | Admit: 2014-02-04 | Discharge: 2014-02-08 | DRG: 872 | Disposition: A | Discharge status: Home / Self Care | Payer: Medicare Other | Attending: Internal Medicine | Admitting: Internal Medicine

## 2014-02-04 ENCOUNTER — Encounter (HOSPITAL_COMMUNITY): Payer: Self-pay | Admitting: Emergency Medicine

## 2014-02-04 ENCOUNTER — Emergency Department (HOSPITAL_COMMUNITY): Payer: Medicare Other

## 2014-02-04 DIAGNOSIS — L0390 Cellulitis, unspecified: Secondary | ICD-10-CM | POA: Diagnosis present

## 2014-02-04 DIAGNOSIS — E871 Hypo-osmolality and hyponatremia: Secondary | ICD-10-CM | POA: Diagnosis present

## 2014-02-04 DIAGNOSIS — L02619 Cutaneous abscess of unspecified foot: Secondary | ICD-10-CM | POA: Diagnosis present

## 2014-02-04 DIAGNOSIS — R739 Hyperglycemia, unspecified: Secondary | ICD-10-CM

## 2014-02-04 DIAGNOSIS — F319 Bipolar disorder, unspecified: Secondary | ICD-10-CM | POA: Diagnosis present

## 2014-02-04 DIAGNOSIS — L0291 Cutaneous abscess, unspecified: Secondary | ICD-10-CM | POA: Diagnosis not present

## 2014-02-04 DIAGNOSIS — N390 Urinary tract infection, site not specified: Secondary | ICD-10-CM

## 2014-02-04 DIAGNOSIS — R509 Fever, unspecified: Secondary | ICD-10-CM | POA: Diagnosis not present

## 2014-02-04 DIAGNOSIS — Z881 Allergy status to other antibiotic agents status: Secondary | ICD-10-CM | POA: Diagnosis not present

## 2014-02-04 DIAGNOSIS — E878 Other disorders of electrolyte and fluid balance, not elsewhere classified: Secondary | ICD-10-CM | POA: Diagnosis not present

## 2014-02-04 DIAGNOSIS — L02419 Cutaneous abscess of limb, unspecified: Secondary | ICD-10-CM | POA: Diagnosis present

## 2014-02-04 DIAGNOSIS — R112 Nausea with vomiting, unspecified: Secondary | ICD-10-CM | POA: Diagnosis not present

## 2014-02-04 DIAGNOSIS — L03119 Cellulitis of unspecified part of limb: Secondary | ICD-10-CM | POA: Diagnosis not present

## 2014-02-04 DIAGNOSIS — F17200 Nicotine dependence, unspecified, uncomplicated: Secondary | ICD-10-CM | POA: Diagnosis present

## 2014-02-04 DIAGNOSIS — Z833 Family history of diabetes mellitus: Secondary | ICD-10-CM | POA: Diagnosis not present

## 2014-02-04 DIAGNOSIS — R1110 Vomiting, unspecified: Secondary | ICD-10-CM

## 2014-02-04 DIAGNOSIS — M79609 Pain in unspecified limb: Secondary | ICD-10-CM | POA: Diagnosis not present

## 2014-02-04 DIAGNOSIS — A419 Sepsis, unspecified organism: Principal | ICD-10-CM | POA: Diagnosis present

## 2014-02-04 DIAGNOSIS — M7989 Other specified soft tissue disorders: Secondary | ICD-10-CM | POA: Diagnosis not present

## 2014-02-04 DIAGNOSIS — E119 Type 2 diabetes mellitus without complications: Secondary | ICD-10-CM | POA: Diagnosis not present

## 2014-02-04 DIAGNOSIS — Z8271 Family history of polycystic kidney: Secondary | ICD-10-CM | POA: Diagnosis not present

## 2014-02-04 DIAGNOSIS — L03115 Cellulitis of right lower limb: Secondary | ICD-10-CM | POA: Diagnosis present

## 2014-02-04 DIAGNOSIS — M25579 Pain in unspecified ankle and joints of unspecified foot: Secondary | ICD-10-CM | POA: Diagnosis not present

## 2014-02-04 DIAGNOSIS — Z79899 Other long term (current) drug therapy: Secondary | ICD-10-CM | POA: Diagnosis not present

## 2014-02-04 LAB — CBC WITH DIFFERENTIAL/PLATELET
BASOS PCT: 0 % (ref 0–1)
Basophils Absolute: 0 10*3/uL (ref 0.0–0.1)
Eosinophils Absolute: 0 10*3/uL (ref 0.0–0.7)
Eosinophils Relative: 0 % (ref 0–5)
HCT: 39.2 % (ref 39.0–52.0)
HEMOGLOBIN: 13.6 g/dL (ref 13.0–17.0)
LYMPHS PCT: 6 % — AB (ref 12–46)
Lymphs Abs: 0.8 10*3/uL (ref 0.7–4.0)
MCH: 31.6 pg (ref 26.0–34.0)
MCHC: 34.7 g/dL (ref 30.0–36.0)
MCV: 91.2 fL (ref 78.0–100.0)
MONOS PCT: 6 % (ref 3–12)
Monocytes Absolute: 0.7 10*3/uL (ref 0.1–1.0)
NEUTROS ABS: 11.2 10*3/uL — AB (ref 1.7–7.7)
NEUTROS PCT: 88 % — AB (ref 43–77)
Platelets: 159 10*3/uL (ref 150–400)
RBC: 4.3 MIL/uL (ref 4.22–5.81)
RDW: 13.1 % (ref 11.5–15.5)
WBC: 12.7 10*3/uL — ABNORMAL HIGH (ref 4.0–10.5)

## 2014-02-04 LAB — COMPREHENSIVE METABOLIC PANEL
ALBUMIN: 3.2 g/dL — AB (ref 3.5–5.2)
ALK PHOS: 55 U/L (ref 39–117)
ALT: 49 U/L (ref 0–53)
AST: 33 U/L (ref 0–37)
BUN: 15 mg/dL (ref 6–23)
CO2: 21 mEq/L (ref 19–32)
Calcium: 9.4 mg/dL (ref 8.4–10.5)
Chloride: 91 mEq/L — ABNORMAL LOW (ref 96–112)
Creatinine, Ser: 0.73 mg/dL (ref 0.50–1.35)
GFR calc Af Amer: >90 mL/min (ref >90)
GFR calc non Af Amer: >90 mL/min (ref >90)
Glucose, Bld: 178 mg/dL — ABNORMAL HIGH (ref 70–99)
Potassium: 3.8 mEq/L (ref 3.7–5.3)
Sodium: 128 mEq/L — ABNORMAL LOW (ref 137–147)
Total Bilirubin: 0.5 mg/dL (ref 0.3–1.2)
Total Protein: 7.9 g/dL (ref 6.0–8.3)

## 2014-02-04 LAB — CBG MONITORING, ED: Glucose-Capillary: 193 mg/dL — ABNORMAL HIGH (ref 70–99)

## 2014-02-04 LAB — GLUCOSE, CAPILLARY
Glucose-Capillary: 150 mg/dL — ABNORMAL HIGH (ref 70–99)
Glucose-Capillary: 146 mg/dL — ABNORMAL HIGH (ref 70–99)

## 2014-02-04 LAB — VALPROIC ACID LEVEL: Valproic Acid Lvl: 47.7 ug/mL — ABNORMAL LOW (ref 50.0–100.0)

## 2014-02-04 LAB — I-STAT CG4 LACTIC ACID, ED: LACTIC ACID, VENOUS: 1.68 mmol/L (ref 0.5–2.2)

## 2014-02-04 MED ORDER — ONDANSETRON HCL 4 MG/2ML IJ SOLN: 4.0000 mg | Freq: Three times a day (TID) | INTRAMUSCULAR | Status: DC | PRN

## 2014-02-04 MED ORDER — VANCOMYCIN HCL 10 G IV SOLR
1250.0000 mg | Freq: Two times a day (BID) | INTRAVENOUS | Status: DC
  Administered 2014-02-04 – 2014-02-08 (×8): 1250 mg via INTRAVENOUS
  Filled 2014-02-04 (×9): qty 1250

## 2014-02-04 MED ORDER — SODIUM CHLORIDE 0.9 % IV SOLN: 250.0000 mL | INTRAVENOUS | Status: DC | PRN

## 2014-02-04 MED ORDER — VANCOMYCIN HCL 10 G IV SOLR: 1.0000 g | Freq: Once | INTRAVENOUS | Status: DC

## 2014-02-04 MED ORDER — RISPERIDONE 2 MG PO TABS
4.0000 mg | ORAL_TABLET | Freq: Every day | ORAL | Status: DC
  Administered 2014-02-04 – 2014-02-07 (×4): 4 mg via ORAL
  Filled 2014-02-04 (×5): qty 2

## 2014-02-04 MED ORDER — ONDANSETRON HCL 4 MG PO TABS: 4.0000 mg | ORAL_TABLET | Freq: Four times a day (QID) | ORAL | Status: DC | PRN

## 2014-02-04 MED ORDER — ACETAMINOPHEN 650 MG RE SUPP: 650.0000 mg | Freq: Four times a day (QID) | RECTAL | Status: DC | PRN

## 2014-02-04 MED ORDER — ASENAPINE MALEATE 5 MG SL SUBL
5.0000 mg | SUBLINGUAL_TABLET | Freq: Two times a day (BID) | SUBLINGUAL | Status: DC
  Administered 2014-02-04 – 2014-02-08 (×9): 5 mg via SUBLINGUAL
  Filled 2014-02-04 (×10): qty 1

## 2014-02-04 MED ORDER — FLUOXETINE HCL 20 MG PO CAPS
40.0000 mg | ORAL_CAPSULE | Freq: Every day | ORAL | Status: DC
  Administered 2014-02-04 – 2014-02-08 (×5): 40 mg via ORAL
  Filled 2014-02-04 (×5): qty 2

## 2014-02-04 MED ORDER — SODIUM CHLORIDE 0.9 % IJ SOLN: 3.0000 mL | INTRAMUSCULAR | Status: DC | PRN

## 2014-02-04 MED ORDER — DIVALPROEX SODIUM ER 500 MG PO TB24
2000.0000 mg | ORAL_TABLET | Freq: Every day | ORAL | Status: DC
  Administered 2014-02-04 – 2014-02-07 (×4): 2000 mg via ORAL
  Filled 2014-02-04 (×5): qty 4

## 2014-02-04 MED ORDER — ONDANSETRON HCL 4 MG/2ML IJ SOLN
4.0000 mg | Freq: Four times a day (QID) | INTRAMUSCULAR | Status: DC | PRN
  Administered 2014-02-04: 4 mg via INTRAVENOUS
  Filled 2014-02-04: qty 2

## 2014-02-04 MED ORDER — DEXTROSE 5 % IV SOLN
1.0000 g | INTRAVENOUS | Status: AC
  Administered 2014-02-04: 1 g via INTRAVENOUS
  Filled 2014-02-04: qty 1

## 2014-02-04 MED ORDER — ONDANSETRON HCL 4 MG/2ML IJ SOLN
4.0000 mg | Freq: Once | INTRAMUSCULAR | Status: AC
  Administered 2014-02-04: 4 mg via INTRAVENOUS
  Filled 2014-02-04: qty 2

## 2014-02-04 MED ORDER — CEFEPIME HCL 1 G IJ SOLR
1.0000 g | Freq: Three times a day (TID) | INTRAMUSCULAR | Status: DC
  Filled 2014-02-04: qty 1

## 2014-02-04 MED ORDER — INSULIN ASPART 100 UNIT/ML ~~LOC~~ SOLN
0.0000 [IU] | Freq: Three times a day (TID) | SUBCUTANEOUS | Status: DC
  Administered 2014-02-04: 2 [IU] via SUBCUTANEOUS
  Administered 2014-02-06 – 2014-02-07 (×2): 3 [IU] via SUBCUTANEOUS

## 2014-02-04 MED ORDER — OXYCODONE HCL 5 MG PO TABS
5.0000 mg | ORAL_TABLET | ORAL | Status: DC | PRN
  Administered 2014-02-04 – 2014-02-08 (×15): 5 mg via ORAL
  Filled 2014-02-04 (×15): qty 1

## 2014-02-04 MED ORDER — SODIUM CHLORIDE 0.9 % IJ SOLN
3.0000 mL | Freq: Two times a day (BID) | INTRAMUSCULAR | Status: DC
  Administered 2014-02-05 – 2014-02-07 (×2): 3 mL via INTRAVENOUS

## 2014-02-04 MED ORDER — SODIUM CHLORIDE 0.9 % IV SOLN
INTRAVENOUS | Status: DC
  Administered 2014-02-04 – 2014-02-07 (×6): via INTRAVENOUS

## 2014-02-04 MED ORDER — INSULIN ASPART 100 UNIT/ML ~~LOC~~ SOLN: 0.0000 [IU] | Freq: Every day | SUBCUTANEOUS | Status: DC

## 2014-02-04 MED ORDER — HYDROMORPHONE HCL PF 1 MG/ML IJ SOLN
0.5000 mg | Freq: Once | INTRAMUSCULAR | Status: AC
  Administered 2014-02-04: 0.5 mg via INTRAVENOUS
  Filled 2014-02-04: qty 1

## 2014-02-04 MED ORDER — ALUM & MAG HYDROXIDE-SIMETH 200-200-20 MG/5ML PO SUSP: 30.0000 mL | Freq: Four times a day (QID) | ORAL | Status: DC | PRN

## 2014-02-04 MED ORDER — HEPARIN SODIUM (PORCINE) 5000 UNIT/ML IJ SOLN
5000.0000 [IU] | Freq: Three times a day (TID) | INTRAMUSCULAR | Status: DC
  Administered 2014-02-04 – 2014-02-08 (×12): 5000 [IU] via SUBCUTANEOUS
  Filled 2014-02-04 (×15): qty 1

## 2014-02-04 MED ORDER — SODIUM CHLORIDE 0.9 % IV SOLN
INTRAVENOUS | Status: AC
  Administered 2014-02-04: 12:00:00 via INTRAVENOUS

## 2014-02-04 MED ORDER — ACETAMINOPHEN 325 MG PO TABS
650.0000 mg | ORAL_TABLET | Freq: Four times a day (QID) | ORAL | Status: DC | PRN
  Administered 2014-02-05 – 2014-02-08 (×3): 650 mg via ORAL
  Filled 2014-02-04 (×3): qty 2

## 2014-02-04 MED ORDER — DEXTROSE 5 % IV SOLN
1.0000 g | Freq: Three times a day (TID) | INTRAVENOUS | Status: DC
  Administered 2014-02-04 – 2014-02-08 (×11): 1 g via INTRAVENOUS
  Filled 2014-02-04 (×13): qty 1

## 2014-02-04 MED ORDER — SODIUM CHLORIDE 0.9 % IV SOLN
Freq: Once | INTRAVENOUS | Status: AC
  Administered 2014-02-04: 10:00:00 via INTRAVENOUS

## 2014-02-04 MED ORDER — MORPHINE SULFATE 2 MG/ML IJ SOLN
2.0000 mg | INTRAMUSCULAR | Status: DC | PRN
  Administered 2014-02-04: 2 mg via INTRAVENOUS
  Filled 2014-02-04 (×3): qty 1

## 2014-02-04 MED ORDER — SODIUM CHLORIDE 0.9 % IV BOLUS (SEPSIS)
1000.0000 mL | Freq: Once | INTRAVENOUS | Status: AC
  Administered 2014-02-04: 1000 mL via INTRAVENOUS

## 2014-02-04 MED ORDER — VANCOMYCIN HCL IN DEXTROSE 1-5 GM/200ML-% IV SOLN
1000.0000 mg | Freq: Once | INTRAVENOUS | Status: AC
  Administered 2014-02-04: 1000 mg via INTRAVENOUS
  Filled 2014-02-04: qty 200

## 2014-02-04 NOTE — ED Notes (Signed)
Pt states that he began having leg swelling, reddness and pain 2 days ago. Pt has hx of cellulitis with admissions. Pt reports N/V as well.

## 2014-02-04 NOTE — Progress Notes (Signed)
VASCULAR LAB PRELIMINARY  PRELIMINARY  PRELIMINARY  PRELIMINARY  Right lower extremity venous Doppler completed.    Preliminary report:  There is no obvious evidence of DVT or SVT noted in the right lower extremity.  Iantha Fallen, RVT 02/04/2014, 9:26 AM

## 2014-02-04 NOTE — H&P (Signed)
[facility] History and Physical  James [name] [id number] DOB: 1976-07-15 DOA: 02/04/2014  Referring physician:  PCP: Pcp Not In System   Chief Complaint: Right lower extremity erythema  HPI: James [name] is a 38 y.o. male with a past medical history of diet-controlled diabetes mellitus, bipolar disorder, history of cellulitis, presenting to the emergency room with complaints of right lower extremity erythema. He states symptoms started 2 days ago initially began as chills and then he noted erythema involving ankle of his right lower extremity. Erythema and swelling progressively worsened and extended proximally to his thigh region. This was associated with severe pain. He also reports having several episodes of nausea and vomiting in the last 24 hours, unable to tolerate by mouth intake. He had a similar presentation back in February of this year at which time he was treated for cellulitis with IV vancomycin and cefepime. He denies recent trauma, insect bites and cannot identify any precipitating is to his cellulitis. In the emergency room he was administered a dose of IV vancomycin and cefepime.                                                                                                                                                                         Review of Systems:  Constitutional:  No weight loss, night sweats, positive for Fevers, chills, fatigue.  HEENT:  No headaches, Difficulty swallowing,Tooth/dental problems,Sore throat,  No sneezing, itching, ear ache, nasal congestion, post nasal drip,  Cardio-vascular:  No chest pain, Orthopnea, PND, swelling in lower extremities, anasarca, dizziness, palpitations  GI:  No heartburn, indigestion, abdominal pain, positive for nausea, vomiting, denies diarrhea, change in bowel habits, loss of appetite  Resp:  No shortness of breath with exertion or at rest. No excess mucus, no productive cough, No non-productive  cough, No coughing up of blood.No change in color of mucus.No wheezing.No chest wall deformity  Skin:  Positive for erythema and swelling involving right lower extremity  GU:  no dysuria, change in color of urine, no urgency or frequency. No flank pain.  Musculoskeletal:  No joint pain or swelling. No decreased range of motion. No back pain.  Psych:  No change in mood or affect. No depression or anxiety. No memory loss.   Past Medical History  Diagnosis Date  . Type II diabetes mellitus ~ 2009    "take Metformin" (08/24/2012)  . Bipolar 1 disorder   . Cellulitis 08/24/2012    "RLE and spot on my right forearm" (08/24/2012)   Past Surgical History  Procedure Laterality Date  . Open anterior shoulder reconstruction  ~ 2003    "right; kept dislocating; cut it open; sewed muscle back together; moved cartilage around and stapled that" (08/24/2012)  .  Foot fracture surgery      "left; scaffold broke" 08/24/2012)   Social History:  reports that he has been smoking Cigarettes.  He has a 25 pack-year smoking history. He has never used smokeless tobacco. He reports that he drinks alcohol. He reports that he does not use illicit drugs.  Allergies  Allergen Reactions  . Amoxicillin Nausea And Vomiting    Family History  Problem Relation Age of Onset  . Diabetic kidney disease Maternal Uncle      Prior to Admission medications   Medication Sig Start Date End Date Taking? Authorizing Provider  asenapine (SAPHRIS) 5 MG SUBL Place 5 mg under the tongue 2 (two) times daily.   Yes Historical Provider, MD  divalproex (DEPAKOTE ER) 500 MG 24 hr tablet Take 2,000 mg by mouth at bedtime.   Yes Historical Provider, MD  FLUoxetine (PROZAC) 40 MG capsule Take 40 mg by mouth daily.   Yes Historical Provider, MD  omeprazole (PRILOSEC OTC) 20 MG tablet Take 20 mg by mouth daily.   Yes Historical Provider, MD  risperidone (RISPERDAL) 4 MG tablet Take 4 mg by mouth at bedtime.    Yes Historical  Provider, MD   Physical Exam: Filed Vitals:   02/04/14 1136  BP: 142/73  Pulse: 94  Temp: 98.7 F (37.1 C)  Resp: 20    BP 142/73  Pulse 94  Temp(Src) 98.7 F (37.1 C) (Oral)  Resp 20  Ht 6\' 3"  (1.905 m)  Wt 127.007 kg (280 lb)  BMI 35.00 kg/m2  SpO2 99%  General:  Appears calm and comfortable, in no acute distress, awake alert oriented Eyes: PERRL, normal lids, irises & conjunctiva ENT: grossly normal hearing, lips & tongue Neck: no LAD, masses or thyromegaly Cardiovascular: RRR, no m/r/g. No LE edema. Telemetry: SR, no arrhythmias  Respiratory: CTA bilaterally, no w/r/r. Normal respiratory effort. Abdomen: soft, ntnd Skin: Patient having circumferential erythema involving ankle lobe right lower extremity, his erythema extending proximally, associated with 2+ pitting edema, region painful to palpation Musculoskeletal: 2+ pitting edema to right lower extremity Psychiatric: grossly normal mood and affect, speech fluent and appropriate Neurologic: grossly non-focal.          Labs on Admission:  Basic Metabolic Panel:  Recent Labs Lab 02/04/14 0842  NA 128*  K 3.8  CL 91*  CO2 21  GLUCOSE 178*  BUN 15  CREATININE 0.73  CALCIUM 9.4   Liver Function Tests:  Recent Labs Lab 02/04/14 0842  AST 33  ALT 49  ALKPHOS 55  BILITOT 0.5  PROT 7.9  ALBUMIN 3.2*   No results found for this basename: LIPASE, AMYLASE,  in the last 168 hours No results found for this basename: AMMONIA,  in the last 168 hours CBC:  Recent Labs Lab 02/04/14 0842  WBC 12.7*  NEUTROABS 11.2*  HGB 13.6  HCT 39.2  MCV 91.2  PLT 159   Cardiac Enzymes: No results found for this basename: CKTOTAL, CKMB, CKMBINDEX, TROPONINI,  in the last 168 hours  BNP (last 3 results) No results found for this basename: PROBNP,  in the last 8760 hours CBG:  Recent Labs Lab 02/04/14 0836 02/04/14 1141  GLUCAP 193* 150*    Radiological Exams on Admission: Dg Ankle Complete  Right  02/04/2014   CLINICAL DATA:  Right ankle swelling and pain.  EXAM: RIGHT ANKLE - COMPLETE 3+ VIEW  COMPARISON:  08/24/2012  FINDINGS: Soft tissue swelling is present.  There is no evidence of fracture, subluxation or  dislocation.  No radiographic evidence of osteomyelitis is noted.  No focal bony lesions are present.  IMPRESSION: Soft tissue swelling without acute bony abnormality.   Electronically Signed   By: Hassan Rowan M.D.   On: 02/04/2014 09:23    EKG: Independently reviewed.   Assessment/Plan Active Problems:   Cellulitis   Cellulitis of right lower leg   1. Right lower extremity cellulitis. Patient presenting with erythema, swelling and pain involving right lower extremity. He has a history of cellulitis involving this right extremity with hospitalization in February of 2015. Will start broad-spectrum IV antibiotic therapy with vancomycin and cefepime. Blood cultures have been drawn in the emergency room. Of note venous Dopplers were performed in the emergency department and showed no evidence of DVT. Provide supportive care.  2. Diet-controlled diabetes mellitus. Last hemoglobin A1c of 6.2 on 10/28/2013. Will provide Accu-Cheks q. a.c. and each bedtime with sliding scale coverage. Place patient on carb modified diet. 3. Bipolar disorder. Will continue patient's home regimen with Risperdal and Depakote 4. Hyponatremia. Lab work showing sodium level of 128. He reports having multiple episodes of nausea vomiting, suspect hyponatremia secondary to hypotonic hypovolemic hyponatremia. Will continue IV fluid resuscitation with normal saline at 125 mL per hour. Repeat lab work in a.m. 5. Nausea vomiting. I suspect secondary to underlying cellulitis, provide as needed antiasthmatic therapy, IV fluids, supportive care 6. DVT prophylaxis. Subcutaneous heparin    Code Status: Full Code Family Communication: Spoke with patient's mother present at bedside Disposition Plan: Anticipate will  require greater than 2 nights hospitalization  Time spent: 70 min  [facility] Pager [phone]  **Disclaimer: This note may have been dictated with voice recognition software. Similar sounding words can inadvertently be transcribed and this note may contain transcription errors which may not have been corrected upon publication of note.**

## 2014-02-04 NOTE — Progress Notes (Signed)
ANTIBIOTIC CONSULT NOTE - FOLLOW UP  Pharmacy Consult for Vancomycin Indication: cellulitis  Allergies  Allergen Reactions  . Amoxicillin Nausea And Vomiting    Patient Measurements: Height: 6\' 3"  (190.5 cm) Weight: 280 lb (127.007 kg) IBW/kg (Calculated) : 84.5  Labs:  Recent Labs  02/04/14 0842  WBC 12.7*  HGB 13.6  PLT 159  CREATININE 0.73   Estimated Creatinine Clearance: 176.2 ml/min (by C-G formula based on Cr of 0.73).  Assessment:  Cefepime and Vancomycin begun in ED, to continue for cellulitis of right leg.  Received Vancomycin 1 gram IV at 10am today.  Goal of Therapy:  Vancomycin trough level 10-15 mcg/ml  Plan:   Continue Vancomycin with 1250 mg IV q12hrs.  Next at 8pm.  Cefepime gram IV q8hrs as ordered.  Will follow renal function, culture data and clinical progress.  Arty Baumgartner,  Pager: [phone] 02/04/2014,1:15 PM

## 2014-02-04 NOTE — ED Provider Notes (Signed)
CSN: [id number]     Arrival date & time 02/04/14  0803 History   First MD Initiated Contact with Patient 02/04/14 [phone]     Chief Complaint  Patient presents with  . Leg Swelling  . Nausea     (Consider location/radiation/quality/duration/timing/severity/associated sxs/prior Treatment) HPI James [name] is a 38 y.o. male who presents to emergency department complaining of right leg swelling, pain, nausea, vomiting, generalized malaise. Patient states that he went swimming in the [place] 3 days ago and after that developed some swelling and redness to the back of his right calf. He reports worsening symptoms since. He reports erythema, swelling, pain to right foot, right lower leg, and few areas over his right thigh. He reports history of right leg cellulitis in the past with prior admissions. He also states he has had persistent nausea, vomiting, chills, sweats over last 2 days. He has not taken any medications prior to coming in. He did not check his temperature at home. He denies any injuries to the leg, but states he gets "scabs that I pick at."   Past Medical History  Diagnosis Date  . Type II diabetes mellitus ~ 2009    "take Metformin" (08/24/2012)  . Bipolar 1 disorder   . Cellulitis 08/24/2012    "RLE and spot on my right forearm" (08/24/2012)   Past Surgical History  Procedure Laterality Date  . Open anterior shoulder reconstruction  ~ 2003    "right; kept dislocating; cut it open; sewed muscle back together; moved cartilage around and stapled that" (08/24/2012)  . Foot fracture surgery      "left; scaffold broke" 08/24/2012)   Family History  Problem Relation Age of Onset  . Diabetic kidney disease Maternal Uncle    History  Substance Use Topics  . Smoking status: Current Every Day Smoker -- 1.00 packs/day for 25 years    Types: Cigarettes  . Smokeless tobacco: Never Used     Comment: 08/24/2012 "stopped both snuff and chew ~ 10 yr ago"  . Alcohol Use: 0.0 oz/week     Review of Systems  Constitutional: Positive for chills and diaphoresis. Negative for fever.  Respiratory: Negative for cough, chest tightness and shortness of breath.   Cardiovascular: Negative for chest pain, palpitations and leg swelling.  Gastrointestinal: Positive for nausea and vomiting. Negative for abdominal pain, diarrhea and abdominal distention.  Genitourinary: Negative for dysuria, urgency, frequency and hematuria.  Musculoskeletal: Positive for arthralgias, joint swelling and myalgias. Negative for neck pain and neck stiffness.  Skin: Positive for color change and wound.  Allergic/Immunologic: Negative for immunocompromised state.  Neurological: Positive for weakness and headaches. Negative for dizziness, light-headedness and numbness.      Allergies  Amoxicillin  Home Medications   Prior to Admission medications   Medication Sig Start Date End Date Taking? Authorizing Provider  asenapine (SAPHRIS) 5 MG SUBL Place 5 mg under the tongue 2 (two) times daily.   Yes Historical Provider, MD  divalproex (DEPAKOTE ER) 500 MG 24 hr tablet Take 2,000 mg by mouth at bedtime.   Yes Historical Provider, MD  FLUoxetine (PROZAC) 40 MG capsule Take 40 mg by mouth daily.   Yes Historical Provider, MD  omeprazole (PRILOSEC OTC) 20 MG tablet Take 20 mg by mouth daily.   Yes Historical Provider, MD  risperidone (RISPERDAL) 4 MG tablet Take 4 mg by mouth at bedtime.    Yes Historical Provider, MD   BP 135/80  Pulse 109  Temp(Src) 98.6 F (37 C) (Oral)  Resp 16  Ht 6\' 3"  (1.905 m)  Wt 280 lb (127.007 kg)  BMI 35.00 kg/m2  SpO2 95% Physical Exam  Nursing note and vitals reviewed. Constitutional: He appears well-developed and well-nourished.  HENT:  Head: Normocephalic and atraumatic.  Eyes: Conjunctivae are normal.  Neck: Neck supple.  Cardiovascular: Normal rate, regular rhythm and normal heart sounds.   Pulmonary/Chest: Effort normal. No respiratory distress. He has no  wheezes. He has no rales.  Abdominal: Soft. Bowel sounds are normal. He exhibits no distension. There is no tenderness. There is no rebound.  Musculoskeletal: He exhibits no edema.  Swelling to the right lower leg and foot. Erythema to the dorsal foot, right anterior and posterior shin with few petechia lesions. Several scabs, with no drainage noted to the lower shin. Lower leg diffusely tender to palpation. Erythema extends from the upper shin. There is also area of erythema and tenderness to the right medial thigh.   Neurological: He is alert.  Skin: Skin is warm. He is diaphoretic.    ED Course  Procedures (including critical care time) Labs Review Labs Reviewed  CBG MONITORING, ED - Abnormal; Notable for the following:    Glucose-Capillary 193 (*)    All other components within normal limits  CULTURE, BLOOD (ROUTINE X 2)  CULTURE, BLOOD (ROUTINE X 2)  CBC WITH DIFFERENTIAL  COMPREHENSIVE METABOLIC PANEL  VALPROIC ACID LEVEL  I-STAT CG4 LACTIC ACID, ED    Imaging Review No results found.   EKG Interpretation None      MDM   Final diagnoses:  Cellulitis of right lower leg    Patient in emergency department with right lower leg cellulitis. Labwork, blood cultures, lactic acid ordered. Will get an x-ray to rule out any subcutaneous gas or osteomyelitis. Also get a venous Doppler to rule out any blood clots.    10:24 AM Venous Doppler negative, labs showed elevated white count at 12.7. Lactic acid normal. Blood sugar elevated at 178. Sodium low at 128. IV fluids started. Ordered vancomycin and cefepime. Patient is penicillin allergic, and was treated with these antibiotics on prior admission with good improvement. At this time he is afebrile, nontoxic appearing. Heart rate improved with IV fluids. Blood pressure is normal. I discussed with [facility] and they will admit patient. Patient's pain is well-controlled with half a milligram Dilaudid, Zofran was given for  nausea. He has not had any vomiting and ED.  Filed Vitals:   02/04/14 0808 02/04/14 0830 02/04/14 1015  BP: 135/80 142/77 115/64  Pulse: 109 105 93  Temp: 98.6 F (37 C)    TempSrc: Oral    Resp: 16  22  Height: 6\' 3"  (1.905 m)    Weight: 280 lb (127.007 kg)    SpO2: 95% 95% 95%       Renold Genta, PA-C 02/05/14 1604

## 2014-02-04 NOTE — Progress Notes (Addendum)
ANTIBIOTIC CONSULT NOTE - INITIAL  Pharmacy Consult for Cefepime Indication: wound infection  Allergies  Allergen Reactions  . Amoxicillin Nausea And Vomiting    Patient Measurements: Height: 6\' 3"  (190.5 cm) Weight: 280 lb (127.007 kg) IBW/kg (Calculated) : 84.5 Adjusted Body Weight:   Vital Signs: Temp: 98.6 F (37 C) (05/24 [id number]) Temp src: Oral (05/24 0808) BP: 135/80 mmHg (05/24 0808) Pulse Rate: 109 (05/24 0808) Intake/Output from previous day:   Intake/Output from this shift:    Labs: No results found for this basename: WBC, HGB, PLT, LABCREA, CREATININE,  in the last 72 hours Estimated Creatinine Clearance: 176.2 ml/min (by C-G formula based on Cr of 0.76). No results found for this basename: VANCOTROUGH, VANCOPEAK, VANCORANDOM, GENTTROUGH, GENTPEAK, GENTRANDOM, TOBRATROUGH, TOBRAPEAK, TOBRARND, AMIKACINPEAK, AMIKACINTROU, AMIKACIN,  in the last 72 hours   Microbiology: No results found for this or any previous visit (from the past 720 hour(s)).  Medical History: Past Medical History  Diagnosis Date  . Type II diabetes mellitus ~ 2009    "take Metformin" (08/24/2012)  . Bipolar 1 disorder   . Cellulitis 08/24/2012    "RLE and spot on my right forearm" (08/24/2012)    Medications:  Scheduled:   Assessment: 38yo male with wound infection and hx of cellulitis.  No labs resulted at this time, but no hx of kidney dz per d/w RN.  Goal of Therapy:  resolution of infection  Plan:  1-  Cefepime 1gm IV q8 2-  F/U baseline labs & adjust dosing if needed  Gracy Bruins, PharmD [facility]

## 2014-02-05 DIAGNOSIS — L0390 Cellulitis, unspecified: Secondary | ICD-10-CM

## 2014-02-05 DIAGNOSIS — A419 Sepsis, unspecified organism: Principal | ICD-10-CM

## 2014-02-05 DIAGNOSIS — L0291 Cutaneous abscess, unspecified: Secondary | ICD-10-CM

## 2014-02-05 DIAGNOSIS — R1110 Vomiting, unspecified: Secondary | ICD-10-CM

## 2014-02-05 LAB — BASIC METABOLIC PANEL
BUN: 12 mg/dL (ref 6–23)
CO2: 23 mEq/L (ref 19–32)
Calcium: 8.6 mg/dL (ref 8.4–10.5)
Chloride: 98 mEq/L (ref 96–112)
Creatinine, Ser: 0.77 mg/dL (ref 0.50–1.35)
GFR calc Af Amer: >90 mL/min (ref >90)
GLUCOSE: 122 mg/dL — AB (ref 70–99)
POTASSIUM: 3.8 meq/L (ref 3.7–5.3)
Sodium: 135 mEq/L — ABNORMAL LOW (ref 137–147)

## 2014-02-05 LAB — CBC
HEMATOCRIT: 36.7 % — AB (ref 39.0–52.0)
HEMOGLOBIN: 12.4 g/dL — AB (ref 13.0–17.0)
MCH: 31.6 pg (ref 26.0–34.0)
MCHC: 33.8 g/dL (ref 30.0–36.0)
MCV: 93.6 fL (ref 78.0–100.0)
Platelets: 158 10*3/uL (ref 150–400)
RBC: 3.92 MIL/uL — AB (ref 4.22–5.81)
RDW: 13.5 % (ref 11.5–15.5)
WBC: 11.2 10*3/uL — ABNORMAL HIGH (ref 4.0–10.5)

## 2014-02-05 LAB — GLUCOSE, CAPILLARY
GLUCOSE-CAPILLARY: 98 mg/dL (ref 70–99)
Glucose-Capillary: 127 mg/dL — ABNORMAL HIGH (ref 70–99)
Glucose-Capillary: 119 mg/dL — ABNORMAL HIGH (ref 70–99)
Glucose-Capillary: 111 mg/dL — ABNORMAL HIGH (ref 70–99)
Glucose-Capillary: 96 mg/dL (ref 70–99)

## 2014-02-05 NOTE — Progress Notes (Signed)
[facility]                                                                              Patient Demographics  James [name], is a 38 y.o. male, DOB - 1976/03/01, [id number]  Admit date - 02/04/2014   Admitting Physician Kelvin Cellar, MD  Outpatient Primary MD for the patient is Pcp Not In System  LOS - 1   Chief Complaint  Patient presents with  . Leg Swelling  . Nausea      HPI: James [name] is a 38 y.o. male with a past medical history of diet-controlled diabetes mellitus, bipolar disorder, history of cellulitis, presented to the emergency room with complaints of right lower extremity erythema. He stated symptoms started 2 days ago and initially began as chills and then he noted erythema involving ankle of his right lower extremity. Erythema and swelling progressively worsened and extended proximally to his thigh region. This was associated with severe pain. He also reported having several episodes of nausea and vomiting in the last 24 hours, unable to tolerate by mouth intake. He had a similar presentation back in February of this year at which time he was treated for cellulitis with IV vancomycin and cefepime. He denied recent trauma, insect bites and cannot identify any precipitating is to his cellulitis. In the emergency room, he was administered a dose of IV vancomycin and cefepime.    Assessment & Plan  Sepsis secondary to Right Lower Extremity Cellulitis -Patient presented with fever, tachycardia as well as leukocytosis at admission -Will continue pain control -Lower extremity Doppler negative for DVT -Continue IV vancomycin and cefepime and IV fluids -Blood cultures currently pending  Diabetes mellitus type 2, diet controlled -Last hemoglobin A1c on 214 2:15 was 6.2 -Continue CBG monitoring and insulin sliding scale along with carb modified diet  Bipolar disorder -Continue Saphris, Depakote, and Risperdal  Hyponatremia/hypochloremia -Likely  secondary to dehydration -Sodium at admission was 128 -Improving, currently 135 -Continue IV fluid  Nausea and vomiting -Likely secondary to patient's sepsis and cellulitis -Improved -Continue supportive care  Depression -Continue Prozac  Code Status: Full  Family Communication: None at Bedside  Disposition Plan: Admitted  Time Spent in minutes   30  minutes  Procedures  Right lower extremity venous Doppler completed.  Preliminary report: There is no obvious evidence of DVT or SVT noted in the right lower extremity.  Consults   None  DVT Prophylaxis  Lovenox   Lab Results  Component Value Date   PLT 158 02/05/2014    Medications  Scheduled Meds: . asenapine  5 mg Sublingual BID  . ceFEPime (MAXIPIME) IV  1 g Intravenous Q8H  . divalproex  2,000 mg Oral QHS  . FLUoxetine  40 mg Oral Daily  . heparin  5,000 Units Subcutaneous 3 times per day  . insulin aspart  0-15 Units Subcutaneous TID WC  . insulin aspart  0-5 Units Subcutaneous QHS  . risperidone  4 mg Oral QHS  . sodium chloride  3 mL Intravenous Q12H  . vancomycin  1,250 mg Intravenous Q12H   Continuous Infusions: . sodium chloride 125 mL/hr at 02/04/14 1954   PRN Meds:.sodium chloride, acetaminophen, acetaminophen,  alum & mag hydroxide-simeth, morphine injection, ondansetron (ZOFRAN) IV, ondansetron, oxyCODONE, sodium chloride  Antibiotics    Anti-infectives   Start     Dose/Rate Route Frequency Ordered Stop   02/04/14 2200  ceFEPIme (MAXIPIME) 1 g in dextrose 5 % 50 mL IVPB  Status:  Discontinued     1 g 100 mL/hr over 30 Minutes Intravenous 3 times per day 02/04/14 0849 02/04/14 1242   02/04/14 2000  ceFEPIme (MAXIPIME) 1 g in dextrose 5 % 50 mL IVPB     1 g 100 mL/hr over 30 Minutes Intravenous Every 8 hours 02/04/14 1242     02/04/14 2000  vancomycin (VANCOCIN) 1,250 mg in sodium chloride 0.9 % 250 mL IVPB     1,250 mg 166.7 mL/hr over 90 Minutes Intravenous Every 12 hours 02/04/14 1313      02/04/14 0930  ceFEPIme (MAXIPIME) 1 g in dextrose 5 % 50 mL IVPB     1 g 100 mL/hr over 30 Minutes Intravenous NOW 02/04/14 0849 02/04/14 1136   02/04/14 0845  vancomycin (VANCOCIN) IVPB 1000 mg/200 mL premix     1,000 mg 200 mL/hr over 60 Minutes Intravenous  Once 02/04/14 0830 02/04/14 1102   02/04/14 0830  vancomycin (VANCOCIN) injection 1 g  Status:  Discontinued     1 g Intravenous  Once 02/04/14 0826 02/04/14 [id number]        Subjective:   [name] seen and examined today.  Patient still complains of right lower extremity pain as well as redness. He states his nausea and vomiting have improved. Patient denies any recent travel or insect bites. Patient denies any shortness of breath or chest pain at this time.  Objective:   Filed Vitals:   02/04/14 1115 02/04/14 1136 02/04/14 2125 02/05/14 0622  BP: 112/63 142/73 133/83 131/78  Pulse: 93 94 93 85  Temp: 99.7 F (37.6 C) 98.7 F (37.1 C) 100.6 F (38.1 C) 98.3 F (36.8 C)  TempSrc: Oral Oral Oral Oral  Resp: 23 20 18 18   Height:      Weight:      SpO2: 94% 99% 99% 98%    Wt Readings from Last 3 Encounters:  02/04/14 127.007 kg (280 lb)  10/27/13 127.007 kg (280 lb)  10/11/13 127.007 kg (280 lb)     Intake/Output Summary (Last 24 hours) at 02/05/14 0755 Last data filed at 02/05/14 [id number]  Gross per 24 hour  Intake 3220.41 ml  Output   1500 ml  Net 1720.41 ml    Exam  General: Well developed, well nourished, NAD, appears stated age  HEENT: NCAT, PERRLA, EOMI, Anicteic Sclera, mucous membranes moist.   Neck: Supple, no JVD, no masses  Cardiovascular: S1 S2 auscultated, no rubs, murmurs or gallops. Regular rate and rhythm.  Respiratory: Clear to auscultation bilaterally with equal chest rise  Abdomen: Soft, nontender, nondistended, + bowel sounds  Extremities: warm dry without cyanosis clubbing. RLE +edema, erythema extending to the knee  Neuro: AAOx3, cranial nerves grossly intact. No focal  deficits.  Skin: Without rashes exudates or nodules, tattoos  Psych: Normal affect and demeanor with intact judgement and insight  Data Review   Micro Results Recent Results (from the past 240 hour(s))  CULTURE, BLOOD (ROUTINE X 2)     Status: None   Collection Time    02/04/14  9:50 AM      Result Value Ref Range Status   Specimen Description BLOOD RIGHT ARM   Final   Special Requests BOTTLES  DRAWN AEROBIC AND ANAEROBIC 6CCAER, 5CCANA   Final   Culture  Setup Time     Final   Value: 02/04/2014 14:53     Performed at [facility]   Culture     Final   Value:        BLOOD CULTURE RECEIVED NO GROWTH TO DATE CULTURE WILL BE HELD FOR 5 DAYS BEFORE ISSUING A FINAL NEGATIVE REPORT     Performed at [facility]   Report Status PENDING   Incomplete  CULTURE, BLOOD (ROUTINE X 2)     Status: None   Collection Time    02/04/14  9:50 AM      Result Value Ref Range Status   Specimen Description BLOOD RIGHT HAND   Final   Special Requests BOTTLES DRAWN AEROBIC AND ANAEROBIC 5CC   Final   Culture  Setup Time     Final   Value: 02/04/2014 14:53     Performed at [facility]   Culture     Final   Value:        BLOOD CULTURE RECEIVED NO GROWTH TO DATE CULTURE WILL BE HELD FOR 5 DAYS BEFORE ISSUING A FINAL NEGATIVE REPORT     Performed at [facility]   Report Status PENDING   Incomplete    Radiology Reports Dg Ankle Complete Right  02/04/2014   CLINICAL DATA:  Right ankle swelling and pain.  EXAM: RIGHT ANKLE - COMPLETE 3+ VIEW  COMPARISON:  08/24/2012  FINDINGS: Soft tissue swelling is present.  There is no evidence of fracture, subluxation or dislocation.  No radiographic evidence of osteomyelitis is noted.  No focal bony lesions are present.  IMPRESSION: Soft tissue swelling without acute bony abnormality.   Electronically Signed   By: Hassan Rowan M.D.   On: 02/04/2014 09:23    CBC  Recent Labs Lab 02/04/14 0842 02/05/14 0459  WBC 12.7* 11.2*  HGB 13.6  12.4*  HCT 39.2 36.7*  PLT 159 158  MCV 91.2 93.6  MCH 31.6 31.6  MCHC 34.7 33.8  RDW 13.1 13.5  LYMPHSABS 0.8  --   MONOABS 0.7  --   EOSABS 0.0  --   BASOSABS 0.0  --     Chemistries   Recent Labs Lab 02/04/14 0842 02/05/14 0459  NA 128* 135*  K 3.8 3.8  CL 91* 98  CO2 21 23  GLUCOSE 178* 122*  BUN 15 12  CREATININE 0.73 0.77  CALCIUM 9.4 8.6  AST 33  --   ALT 49  --   ALKPHOS 55  --   BILITOT 0.5  --    ------------------------------------------------------------------------------------------------------------------ estimated creatinine clearance is 176.2 ml/min (by C-G formula based on Cr of 0.77). ------------------------------------------------------------------------------------------------------------------ No results found for this basename: HGBA1C,  in the last 72 hours ------------------------------------------------------------------------------------------------------------------ No results found for this basename: CHOL, HDL, LDLCALC, TRIG, CHOLHDL, LDLDIRECT,  in the last 72 hours ------------------------------------------------------------------------------------------------------------------ No results found for this basename: TSH, T4TOTAL, FREET3, T3FREE, THYROIDAB,  in the last 72 hours ------------------------------------------------------------------------------------------------------------------ No results found for this basename: VITAMINB12, FOLATE, FERRITIN, TIBC, IRON, RETICCTPCT,  in the last 72 hours  Coagulation profile No results found for this basename: INR, PROTIME,  in the last 168 hours  No results found for this basename: DDIMER,  in the last 72 hours  Cardiac Enzymes No results found for this basename: CK, CKMB, TROPONINI, MYOGLOBIN,  in the last 168 hours ------------------------------------------------------------------------------------------------------------------ No components found with this basename: POCBNP,  Kippy Gohman D.O. on 02/05/2014 at 7:55 AM  Between 7am to 7pm - Pager - [phone]  After 7pm go to [email or web address] - password [email or web address]  And look for the night coverage person covering for me after hours  [facility] Office  [phone]

## 2014-02-06 DIAGNOSIS — R7309 Other abnormal glucose: Secondary | ICD-10-CM

## 2014-02-06 LAB — BASIC METABOLIC PANEL
BUN: 9 mg/dL (ref 6–23)
CALCIUM: 9.1 mg/dL (ref 8.4–10.5)
CO2: 29 meq/L (ref 19–32)
Chloride: 98 mEq/L (ref 96–112)
Creatinine, Ser: 0.65 mg/dL (ref 0.50–1.35)
GFR calc Af Amer: >90 mL/min (ref >90)
GFR calc non Af Amer: >90 mL/min (ref >90)
GLUCOSE: 174 mg/dL — AB (ref 70–99)
Potassium: 4.1 mEq/L (ref 3.7–5.3)
Sodium: 138 mEq/L (ref 137–147)

## 2014-02-06 LAB — CBC
HCT: 34 % — ABNORMAL LOW (ref 39.0–52.0)
Hemoglobin: 11.4 g/dL — ABNORMAL LOW (ref 13.0–17.0)
MCH: 31.9 pg (ref 26.0–34.0)
MCHC: 33.5 g/dL (ref 30.0–36.0)
MCV: 95.2 fL (ref 78.0–100.0)
PLATELETS: 169 10*3/uL (ref 150–400)
RBC: 3.57 MIL/uL — AB (ref 4.22–5.81)
RDW: 13.4 % (ref 11.5–15.5)
WBC: 6.3 10*3/uL (ref 4.0–10.5)

## 2014-02-06 LAB — GLUCOSE, CAPILLARY
GLUCOSE-CAPILLARY: 110 mg/dL — AB (ref 70–99)
GLUCOSE-CAPILLARY: 157 mg/dL — AB (ref 70–99)
GLUCOSE-CAPILLARY: 127 mg/dL — AB (ref 70–99)
Glucose-Capillary: 99 mg/dL (ref 70–99)

## 2014-02-06 MED ORDER — FUROSEMIDE 10 MG/ML IJ SOLN
20.0000 mg | Freq: Once | INTRAMUSCULAR | Status: AC
  Administered 2014-02-06: 20 mg via INTRAVENOUS

## 2014-02-06 MED ORDER — FUROSEMIDE 10 MG/ML IJ SOLN
INTRAMUSCULAR | Status: AC
  Filled 2014-02-06: qty 2

## 2014-02-06 NOTE — Progress Notes (Signed)
Dr Barbaraann Rondo called about pt's leg being more swollen dk blisters around lt side of rt foot 2nd 3rd toe dk blister on top stated she prob order lasix IV rt foot elevated enc to keep elevated above heart

## 2014-02-06 NOTE — Progress Notes (Signed)
Utilization review completed.  

## 2014-02-06 NOTE — Progress Notes (Signed)
[facility]                                                                              Patient Demographics  James [name], is a 38 y.o. male, DOB - [birth year], [id number]  Admit date - 02/04/2014   Admitting Physician Kelvin Cellar, MD  Outpatient Primary MD for the patient is Pcp Not In System  LOS - 2   Chief Complaint  Patient presents with  . Leg Swelling  . Nausea      HPI: [name] James is a 38 y.o. male with a past medical history of diet-controlled diabetes mellitus, bipolar disorder, history of cellulitis, presented to the emergency room with complaints of right lower extremity erythema. He stated symptoms started 2 days ago and initially began as chills and then he noted erythema involving ankle of his right lower extremity. Erythema and swelling progressively worsened and extended proximally to his thigh region. This was associated with severe pain. He also reported having several episodes of nausea and vomiting in the last 24 hours, unable to tolerate by mouth intake. He had a similar presentation back in February of this year at which time he was treated for cellulitis with IV vancomycin and cefepime. He denied recent trauma, insect bites and cannot identify any precipitating is to his cellulitis. In the emergency room, he was administered a dose of IV vancomycin and cefepime.    Assessment & Plan  Sepsis secondary to Right Lower Extremity Cellulitis -Patient presented with fever, tachycardia as well as leukocytosis at admission -Will continue pain control -Lower extremity Doppler negative for DVT -Continue IV vancomycin and cefepime and IV fluids -Blood cultures currently show no growth  Diabetes mellitus type 2, diet controlled -Last hemoglobin A1c on 214 2:15 was 6.2 -Continue CBG monitoring and insulin sliding scale along with carb modified diet  Bipolar disorder -Continue Saphris, Depakote, and  Risperdal  Hyponatremia/hypochloremia -Likely secondary to dehydration -Sodium at admission was 128 -Improving -Labs pending this morning -Continue IV fluid  Nausea and vomiting -Likely secondary to patient's sepsis and cellulitis -Improved -Continue supportive care  Depression -Continue Prozac  Code Status: Full  Family Communication: None at Bedside  Disposition Plan: Admitted  Time Spent in minutes   25  minutes  Procedures  Right lower extremity venous Doppler completed.  Preliminary report: There is no obvious evidence of DVT or SVT noted in the right lower extremity.  Consults   None  DVT Prophylaxis  Lovenox   Lab Results  Component Value Date   PLT 158 02/05/2014    Medications  Scheduled Meds: . asenapine  5 mg Sublingual BID  . ceFEPime (MAXIPIME) IV  1 g Intravenous Q8H  . divalproex  2,000 mg Oral QHS  . FLUoxetine  40 mg Oral Daily  . heparin  5,000 Units Subcutaneous 3 times per day  . insulin aspart  0-15 Units Subcutaneous TID WC  . insulin aspart  0-5 Units Subcutaneous QHS  . risperidone  4 mg Oral QHS  . sodium chloride  3 mL Intravenous Q12H  . vancomycin  1,250 mg Intravenous Q12H   Continuous Infusions: . sodium chloride 125 mL/hr at 02/05/14 [id number]  PRN Meds:.sodium chloride, acetaminophen, acetaminophen, alum & mag hydroxide-simeth, morphine injection, ondansetron (ZOFRAN) IV, ondansetron, oxyCODONE, sodium chloride  Antibiotics    Anti-infectives   Start     Dose/Rate Route Frequency Ordered Stop   02/04/14 2200  ceFEPIme (MAXIPIME) 1 g in dextrose 5 % 50 mL IVPB  Status:  Discontinued     1 g 100 mL/hr over 30 Minutes Intravenous 3 times per day 02/04/14 0849 02/04/14 1242   02/04/14 2000  ceFEPIme (MAXIPIME) 1 g in dextrose 5 % 50 mL IVPB     1 g 100 mL/hr over 30 Minutes Intravenous Every 8 hours 02/04/14 1242     02/04/14 2000  vancomycin (VANCOCIN) 1,250 mg in sodium chloride 0.9 % 250 mL IVPB     1,250 mg 166.7 mL/hr  over 90 Minutes Intravenous Every 12 hours 02/04/14 1313     02/04/14 0930  ceFEPIme (MAXIPIME) 1 g in dextrose 5 % 50 mL IVPB     1 g 100 mL/hr over 30 Minutes Intravenous NOW 02/04/14 0849 02/04/14 1136   02/04/14 0845  vancomycin (VANCOCIN) IVPB 1000 mg/200 mL premix     1,000 mg 200 mL/hr over 60 Minutes Intravenous  Once 02/04/14 0830 02/04/14 1102   02/04/14 0830  vancomycin (VANCOCIN) injection 1 g  Status:  Discontinued     1 g Intravenous  Once 02/04/14 0826 02/04/14 [id number]        Subjective:   [name] seen and examined today.  Patient still complains of right lower extremity pain, however states it is improved.  Patient no longer feels warm and denies nausea and vomiting at this time.   Objective:   Filed Vitals:   02/05/14 0622 02/05/14 1300 02/05/14 2151 02/06/14 0500  BP: 131/78 121/62 128/71 131/67  Pulse: 85 85 85 89  Temp: 98.3 F (36.8 C) 97.7 F (36.5 C) 98.4 F (36.9 C) 98.7 F (37.1 C)  TempSrc: Oral  Oral Oral  Resp: 18 18 18 18   Height:      Weight:      SpO2: 98% 95% 98% 98%    Wt Readings from Last 3 Encounters:  02/04/14 127.007 kg (280 lb)  10/27/13 127.007 kg (280 lb)  10/11/13 127.007 kg (280 lb)     Intake/Output Summary (Last 24 hours) at 02/06/14 [id number] Last data filed at 02/06/14 0630  Gross per 24 hour  Intake    480 ml  Output   2425 ml  Net  -1945 ml    Exam  General: Well developed, well nourished, NAD, appears stated age  HEENT: NCAT, mucous membranes moist.   Neck: Supple, no JVD, no masses  Cardiovascular: S1 S2 auscultated, no rubs, murmurs or gallops. Regular rate and rhythm.  Respiratory: Clear to auscultation bilaterally with equal chest rise  Abdomen: Soft, nontender, nondistended, + bowel sounds  Extremities: warm dry without cyanosis clubbing. RLE +edema, erythema extending to the knee- improving on leg, however, darker on foot, pulses palpable  Neuro: AAOx3, cranial nerves grossly intact. No focal  deficits.  Skin: Without rashes exudates or nodules  Psych: Normal affect and demeanor with intact judgement and insight  Data Review   Micro Results Recent Results (from the past 240 hour(s))  CULTURE, BLOOD (ROUTINE X 2)     Status: None   Collection Time    02/04/14  9:50 AM      Result Value Ref Range Status   Specimen Description BLOOD RIGHT ARM   Final   Special Requests  BOTTLES DRAWN AEROBIC AND ANAEROBIC 6CCAER, [place]   Final   Culture  Setup Time     Final   Value: 02/04/2014 14:53     Performed at [facility]   Culture     Final   Value:        BLOOD CULTURE RECEIVED NO GROWTH TO DATE CULTURE WILL BE HELD FOR 5 DAYS BEFORE ISSUING A FINAL NEGATIVE REPORT     Performed at [facility]   Report Status PENDING   Incomplete  CULTURE, BLOOD (ROUTINE X 2)     Status: None   Collection Time    02/04/14  9:50 AM      Result Value Ref Range Status   Specimen Description BLOOD RIGHT HAND   Final   Special Requests BOTTLES DRAWN AEROBIC AND ANAEROBIC 5CC   Final   Culture  Setup Time     Final   Value: 02/04/2014 14:53     Performed at [facility]   Culture     Final   Value:        BLOOD CULTURE RECEIVED NO GROWTH TO DATE CULTURE WILL BE HELD FOR 5 DAYS BEFORE ISSUING A FINAL NEGATIVE REPORT     Performed at [facility]   Report Status PENDING   Incomplete    Radiology Reports Dg Ankle Complete Right  02/04/2014   CLINICAL DATA:  Right ankle swelling and pain.  EXAM: RIGHT ANKLE - COMPLETE 3+ VIEW  COMPARISON:  08/24/2012  FINDINGS: Soft tissue swelling is present.  There is no evidence of fracture, subluxation or dislocation.  No radiographic evidence of osteomyelitis is noted.  No focal bony lesions are present.  IMPRESSION: Soft tissue swelling without acute bony abnormality.   Electronically Signed   By: Hassan Rowan M.D.   On: 02/04/2014 09:23    CBC  Recent Labs Lab 02/04/14 0842 02/05/14 0459  WBC 12.7* 11.2*  HGB 13.6 12.4*   HCT 39.2 36.7*  PLT 159 158  MCV 91.2 93.6  MCH 31.6 31.6  MCHC 34.7 33.8  RDW 13.1 13.5  LYMPHSABS 0.8  --   MONOABS 0.7  --   EOSABS 0.0  --   BASOSABS 0.0  --     Chemistries   Recent Labs Lab 02/04/14 0842 02/05/14 0459  NA 128* 135*  K 3.8 3.8  CL 91* 98  CO2 21 23  GLUCOSE 178* 122*  BUN 15 12  CREATININE 0.73 0.77  CALCIUM 9.4 8.6  AST 33  --   ALT 49  --   ALKPHOS 55  --   BILITOT 0.5  --    ------------------------------------------------------------------------------------------------------------------ estimated creatinine clearance is 176.2 ml/min (by C-G formula based on Cr of 0.77). ------------------------------------------------------------------------------------------------------------------ No results found for this basename: HGBA1C,  in the last 72 hours ------------------------------------------------------------------------------------------------------------------ No results found for this basename: CHOL, HDL, LDLCALC, TRIG, CHOLHDL, LDLDIRECT,  in the last 72 hours ------------------------------------------------------------------------------------------------------------------ No results found for this basename: TSH, T4TOTAL, FREET3, T3FREE, THYROIDAB,  in the last 72 hours ------------------------------------------------------------------------------------------------------------------ No results found for this basename: VITAMINB12, FOLATE, FERRITIN, TIBC, IRON, RETICCTPCT,  in the last 72 hours  Coagulation profile No results found for this basename: INR, PROTIME,  in the last 168 hours  No results found for this basename: DDIMER,  in the last 72 hours  Cardiac Enzymes No results found for this basename: CK, CKMB, TROPONINI, MYOGLOBIN,  in the last 168 hours ------------------------------------------------------------------------------------------------------------------ No components found with this basename: POCBNP,  Almee Pelphrey D.O. on 02/06/2014 at 8:23 AM  Between 7am to 7pm - Pager - [phone]  After 7pm go to [email or web address] - password [email or web address]  And look for the night coverage person covering for me after hours  [facility] Office  [phone]

## 2014-02-07 ENCOUNTER — Inpatient Hospital Stay (HOSPITAL_COMMUNITY): Payer: Medicare Other

## 2014-02-07 DIAGNOSIS — M79609 Pain in unspecified limb: Secondary | ICD-10-CM

## 2014-02-07 LAB — BASIC METABOLIC PANEL
BUN: 9 mg/dL (ref 6–23)
CALCIUM: 8.9 mg/dL (ref 8.4–10.5)
CHLORIDE: 99 meq/L (ref 96–112)
CO2: 25 mEq/L (ref 19–32)
Creatinine, Ser: 0.6 mg/dL (ref 0.50–1.35)
GFR calc non Af Amer: >90 mL/min (ref >90)
Glucose, Bld: 109 mg/dL — ABNORMAL HIGH (ref 70–99)
Potassium: 4 mEq/L (ref 3.7–5.3)
SODIUM: 138 meq/L (ref 137–147)

## 2014-02-07 LAB — GLUCOSE, CAPILLARY
GLUCOSE-CAPILLARY: 117 mg/dL — AB (ref 70–99)
Glucose-Capillary: 100 mg/dL — ABNORMAL HIGH (ref 70–99)
Glucose-Capillary: 152 mg/dL — ABNORMAL HIGH (ref 70–99)
Glucose-Capillary: 115 mg/dL — ABNORMAL HIGH (ref 70–99)

## 2014-02-07 LAB — CBC
HCT: 36 % — ABNORMAL LOW (ref 39.0–52.0)
Hemoglobin: 12.5 g/dL — ABNORMAL LOW (ref 13.0–17.0)
MCH: 32.6 pg (ref 26.0–34.0)
MCHC: 34.7 g/dL (ref 30.0–36.0)
MCV: 93.8 fL (ref 78.0–100.0)
PLATELETS: 200 10*3/uL (ref 150–400)
RBC: 3.84 MIL/uL — ABNORMAL LOW (ref 4.22–5.81)
RDW: 13.2 % (ref 11.5–15.5)
WBC: 5.8 10*3/uL (ref 4.0–10.5)

## 2014-02-07 MED ORDER — IOHEXOL 300 MG/ML  SOLN
80.0000 mL | Freq: Once | INTRAMUSCULAR | Status: AC | PRN
  Administered 2014-02-07: 80 mL via INTRAVENOUS

## 2014-02-07 NOTE — Progress Notes (Addendum)
Patient ID: James [name], male   DOB: 08-Jul-1976, 38 y.o.   MRN: [id number] [facility] PROGRESS NOTE  RAYNOLD BLANKENBAKER EUM:[id number] DOB: [birth year] DOA: 02/04/2014 PCP: Pcp Not In System  Brief narrative: 38 y.o. male with a past medical history of diet-controlled diabetes mellitus, bipolar disorder, history of cellulitis who presented to [facility] ED 02/04/2014 with right lower extremity swelling, redness and pain for past few days prior to this admission. Pt also reported associated fevers and chills. He also reported having several episodes of nausea and vomiting and was unable to tolerate by mouth intake. He had a similar presentation back in February of this year at which time he was treated for cellulitis with IV vancomycin and cefepime. He denied recent trauma, insect bites. Started on vanco and cefepime for cellulitis.   Assessment and Plan:  Principal Problem: Sepsis secondary to Right Lower Extremity Cellulitis   Pt met criteria for sepsis on admission with fever, tachycardia, leukocytosis and evidence of right lower extremity cellulitis  Continue current IV antibiotics: vanco and cefepime  Obtain CT right foot and ABI for eval of abscess and pulses respectively  Active Problems: Diabetes mellitus type 2  Diet controlled Bipolar disorder    Continue Saphris, Depakote, and Risperdal  Hyponatremia/hypochloremia   Likely due to dehydration  Improved with IV fluids  Nausea and vomiting   Secondary to cellulitis, infection  Resolved  Depression   Continue Prozac   DVT prophylaxis: Heparin subQ  Code Status: full code  Family Communication: no family at the bedside  Disposition Plan: home when stable   Robbie Lis, MD  [facility] Pager [phone]  If 7PM-7AM, please contact night-coverage [email or web address] Password [facility] 02/07/2014, 9:46 AM   LOS: 3 days   Consultants:  None   Procedures:  LE doppler - no  DVT ABI  Antibiotics:  Cefepime 02/04/2014 -->  Vancomycin 02/04/2014 -->  HPI/Subjective: Reports no significant changes in RLE cellulitis.  Objective: Filed Vitals:   02/06/14 0500 02/06/14 1529 02/06/14 2120 02/07/14 0528  BP: 131/67 135/85 136/88 139/83  Pulse: 89 62 71 72  Temp: 98.7 F (37.1 C) 98.3 F (36.8 C) 98.8 F (37.1 C) 98.1 F (36.7 C)  TempSrc: Oral Oral Oral Oral  Resp: _0 Height:      Weight:      SpO2: 98% 98% 96% 96%    Intake/Output Summary (Last 24 hours) at 02/07/14 0946 Last data filed at 02/07/14 0530  Gross per 24 hour  Intake   4410 ml  Output   2800 ml  Net   1610 ml    Exam:   General:  Pt is alert, follows commands appropriately, not in acute distress  Cardiovascular: Regular rate and rhythm, S1/S2, no murmurs, no rubs, no gallops  Respiratory: Clear to auscultation bilaterally, no wheezing, no crackles, no rhonchi  Abdomen: Soft, non tender, non distended, bowel sounds present, no guarding  Extremities: right LE / foot erythema, bluish discoloration of 3rd and 4th toe, pulses palpable on left but not able to palpate on right foot  Neuro: Grossly nonfocal  Data Reviewed: Basic Metabolic Panel:  Recent Labs Lab 02/04/14 0842 02/05/14 0459 02/06/14 0900 02/07/14 0525  NA 128* 135* 138 138  K 3.8 3.8 4.1 4.0  CL 91* 98 98 99  CO2 _1 GLUCOSE 178* 122* 174* 109*  BUN _2 CREATININE 0.73 0.77 0.65 0.60  CALCIUM 9.4 8.6 9.1 8.9  Liver Function Tests:  Recent Labs Lab 02/04/14 0842  AST 33  ALT 49  ALKPHOS 55  BILITOT 0.5  PROT 7.9  ALBUMIN 3.2*   No results found for this basename: LIPASE, AMYLASE,  in the last 168 hours No results found for this basename: AMMONIA,  in the last 168 hours CBC:  Recent Labs Lab 02/04/14 0842 02/05/14 0459 02/06/14 0900 02/07/14 0525  WBC 12.7* 11.2* 6.3 5.8  NEUTROABS 11.2*  --   --   --   HGB 13.6 12.4* 11.4* 12.5*  HCT 39.2 36.7* 34.0* 36.0*   MCV 91.2 93.6 95.2 93.8  PLT 159 158 169 200   Cardiac Enzymes: No results found for this basename: CKTOTAL, CKMB, CKMBINDEX, TROPONINI,  in the last 168 hours BNP: No components found with this basename: POCBNP,  CBG:  Recent Labs Lab 02/06/14 0624 02/06/14 1138 02/06/14 1639 02/06/14 2121 02/07/14 0638  GLUCAP 110* 157* 99 127* 117*    Recent Results (from the past 240 hour(s))  CULTURE, BLOOD (ROUTINE X 2)     Status: None   Collection Time    02/04/14  9:50 AM      Result Value Ref Range Status   Specimen Description BLOOD RIGHT ARM   Final   Special Requests BOTTLES DRAWN AEROBIC AND ANAEROBIC [place], [place]   Final   Culture  Setup Time     Final   Value: 02/04/2014 14:53     Performed at [facility]   Culture     Final   Value:        BLOOD CULTURE RECEIVED NO GROWTH TO DATE CULTURE WILL BE HELD FOR 5 DAYS BEFORE ISSUING A FINAL NEGATIVE REPORT     Performed at [facility]   Report Status PENDING   Incomplete  CULTURE, BLOOD (ROUTINE X 2)     Status: None   Collection Time    02/04/14  9:50 AM      Result Value Ref Range Status   Specimen Description BLOOD RIGHT HAND   Final   Special Requests BOTTLES DRAWN AEROBIC AND ANAEROBIC 5CC   Final   Culture  Setup Time     Final   Value: 02/04/2014 14:53     Performed at [facility]   Culture     Final   Value:        BLOOD CULTURE RECEIVED NO GROWTH TO DATE CULTURE WILL BE HELD FOR 5 DAYS BEFORE ISSUING A FINAL NEGATIVE REPORT     Performed at [facility]   Report Status PENDING   Incomplete     Studies: No results found.  Scheduled Meds: . asenapine  5 mg Sublingual BID  . ceFEPime (MAXIPIME) IV  1 g Intravenous Q8H  . divalproex  2,000 mg Oral QHS  . FLUoxetine  40 mg Oral Daily  . heparin  5,000 Units Subcutaneous 3 times per day  . insulin aspart  0-15 Units Subcutaneous TID WC  . insulin aspart  0-5 Units Subcutaneous QHS  . risperidone  4 mg Oral QHS  .  vancomycin  1,250 mg Intravenous Q12H   Continuous Infusions: . sodium chloride 125 mL/hr at 02/06/14 [phone]

## 2014-02-07 NOTE — ED Provider Notes (Signed)
Medical screening examination/treatment/procedure(s) were conducted as a shared visit with non-physician practitioner(s) and myself.  I personally evaluated the patient during the encounter.   EKG Interpretation None     James [name] is a 38 y.o. male hx of DM with previous cellulitis of his leg here with cellulitis. R leg swelling and erythema for several days. He had been admitted in January for the same. On exam, R leg more swollen than left. Obvious cellulitis entire R calf area. DVT study neg. Will admit for IV abx.      Wandra Arthurs, MD 02/07/14 [phone]

## 2014-02-08 LAB — GLUCOSE, CAPILLARY: GLUCOSE-CAPILLARY: 105 mg/dL — AB (ref 70–99)

## 2014-02-08 LAB — VANCOMYCIN, TROUGH: Vancomycin Tr: <5 ug/mL — ABNORMAL LOW (ref 10.0–20.0)

## 2014-02-08 MED ORDER — DOXYCYCLINE HYCLATE 100 MG PO TABS
100.0000 mg | ORAL_TABLET | Freq: Two times a day (BID) | ORAL | Status: DC
Start: 2014-02-08 — End: 2014-02-17

## 2014-02-08 MED ORDER — AMOXICILLIN-POT CLAVULANATE 875-125 MG PO TABS: 1.0000 | ORAL_TABLET | Freq: Two times a day (BID) | ORAL | Status: DC

## 2014-02-08 MED ORDER — VANCOMYCIN HCL IN DEXTROSE 1-5 GM/200ML-% IV SOLN
1000.0000 mg | Freq: Three times a day (TID) | INTRAVENOUS | Status: DC
  Filled 2014-02-08: qty 200

## 2014-02-08 MED ORDER — VANCOMYCIN HCL 10 G IV SOLR
1250.0000 mg | INTRAVENOUS | Status: AC
  Administered 2014-02-08: 1250 mg via INTRAVENOUS
  Filled 2014-02-08: qty 1250

## 2014-02-08 NOTE — Discharge Instructions (Signed)
Cellulitis Cellulitis is an infection of the skin and the tissue beneath it. The infected area is usually red and tender. Cellulitis occurs most often in the arms and lower legs.  CAUSES  Cellulitis is caused by bacteria that enter the skin through cracks or cuts in the skin. The most common types of bacteria that cause cellulitis are Staphylococcus and Streptococcus. SYMPTOMS   Redness and warmth.  Swelling.  Tenderness or pain.  Fever. DIAGNOSIS  Your caregiver can usually determine what is wrong based on a physical exam. Blood tests may also be done. TREATMENT  Treatment usually involves taking an antibiotic medicine. HOME CARE INSTRUCTIONS   Take your antibiotics as directed. Finish them even if you start to feel better.  Keep the infected arm or leg elevated to reduce swelling.  Apply a warm cloth to the affected area up to 4 times per day to relieve pain.  Only take over-the-counter or prescription medicines for pain, discomfort, or fever as directed by your caregiver.  Keep all follow-up appointments as directed by your caregiver. SEEK MEDICAL CARE IF:   You notice red streaks coming from the infected area.  Your red area gets larger or turns dark in color.  Your bone or joint underneath the infected area becomes painful after the skin has healed.  Your infection returns in the same area or another area.  You notice a swollen bump in the infected area.  You develop new symptoms. SEEK IMMEDIATE MEDICAL CARE IF:   You have a fever.  You feel very sleepy.  You develop vomiting or diarrhea.  You have a general ill feeling (malaise) with muscle aches and pains. MAKE SURE YOU:   Understand these instructions.  Will watch your condition.  Will get help right away if you are not doing well or get worse. Document Released: 06/10/2005 Document Revised: 03/01/2012 Document Reviewed: 11/16/2011 Exit[employer] Patient Information 2014 [employer].  

## 2014-02-08 NOTE — Progress Notes (Signed)
Pt discharged to home. D/c instructions given, no questions verbalized. Vitals stable. 

## 2014-02-08 NOTE — Discharge Summary (Signed)
Physician Discharge Summary  James [name] ZOX:[id number] DOB: 07-30-1976 DOA: 02/04/2014  PCP: Pcp Not In System  Admit date: 02/04/2014 Discharge date: 02/08/2014  Recommendations for Outpatient Follow-up:  Continue Augmentin and doxycycline for 6 more days on discharge for lower extremity cellulitis. Follow up with PCP in 1 week to make sure symptoms are controlled. Doppler study was negative for blood clot. CT scan of your leg did not show abscess, just cellulitis for which you have received antibiotics (vancomycin and cefepime). On discharge, you will continue Augmentin and doxycycline.  Discharge Diagnoses:  Active Problems:   Cellulitis   Cellulitis of right lower leg    Discharge Condition: stable   Diet recommendation: as tolerated   History of present illness:  38 y.o. male with a past medical history of diet-controlled diabetes mellitus, bipolar disorder, history of cellulitis who presented to [facility] ED 02/04/2014 with right lower extremity swelling, redness and pain for past few days prior to this admission. Pt also reported associated fevers and chills. He also reported having several episodes of nausea and vomiting and was unable to tolerate by mouth intake. He had a similar presentation back in February of this year at which time he was treated for cellulitis with IV vancomycin and cefepime. He denied recent trauma, insect bites. Started on vanco and cefepime for cellulitis.   Assessment and Plan:   Principal Problem:  Sepsis secondary to Right Lower Extremity Cellulitis  Pt met criteria for sepsis on admission with fever, tachycardia, leukocytosis and evidence of right lower extremity cellulitis Continue current IV antibiotics: vanco and cefepime while in hospital and stop prior to discharge. On discharge, doxycycline and Augmentin for 6 more days which would complete 10 day abx treatment. Pt knows to follow up with PCP upon completion of abx to make sure symptoms are  controlled.  CT left foot did not reveal abscess ABI with good pulses Doppler ruled out DVT Active Problems:  Diabetes mellitus type 2  Diet controlled Bipolar disorder  Continue Saphris, Depakote, and Risperdal  Hyponatremia/hypochloremia  Likely due to dehydration  Improved with IV fluids  Nausea and vomiting  Secondary to cellulitis, infection  Resolved  Depression  Continue Prozac    DVT prophylaxis: Heparin subQ    Code Status: full code  Family Communication: no family at the bedside   Consultants:  None  Procedures:  LE doppler - no DVT ABI Antibiotics:  Cefepime 02/04/2014 --> 02/08/2014 Vancomycin 02/04/2014 --> 02/08/2014   Signed:  Robbie Lis, MD  [facility] 02/08/2014, 10:54 AM  Pager #: [phone]   Discharge Exam: Filed Vitals:   02/08/14 0440  BP: 145/83  Pulse: 63  Temp: 98.2 F (36.8 C)  Resp: 18   Filed Vitals:   02/07/14 0528 02/07/14 1400 02/07/14 1934 02/08/14 0440  BP: 139/83 139/83 150/85 145/83  Pulse: 72 65 70 63  Temp: 98.1 F (36.7 C) 98 F (36.7 C) 99.3 F (37.4 C) 98.2 F (36.8 C)  TempSrc: Oral Oral    Resp: _0 Height:      Weight:      SpO2: 96% 97% 97% 98%    General: Pt is alert, follows commands appropriately, not in acute distress Cardiovascular: Regular rate and rhythm, S1/S2 +, no murmurs, no rubs, no gallops Respiratory: Clear to auscultation bilaterally, no wheezing, no crackles, no rhonchi Abdominal: Soft, non tender, non distended, bowel sounds +, no guarding Extremities: right lower extremity cellulitis improving, bluish discoloration in toes is improving,  pulses palpable bilaterally DP and PT Neuro: Grossly nonfocal  Discharge Instructions  Discharge Instructions   Call MD for:  difficulty breathing, headache or visual disturbances    Complete by:  As directed      Call MD for:  persistant dizziness or light-headedness    Complete by:  As directed      Call MD for:   persistant nausea and vomiting    Complete by:  As directed      Call MD for:  severe uncontrolled pain    Complete by:  As directed      Diet - low sodium heart healthy    Complete by:  As directed      Discharge instructions    Complete by:  As directed   Continue Augmentin and doxycycline for 6 more days on discharge for lower extremity cellulitis. Follow up with PCP in 1 week to make sure symptoms are controlled. Doppler study was negative for blood clot. CT scan of your leg did not show abscess, just cellulitis for which you have received antibiotics (vancomycin and cefepime). On discharge, you will continue Augmentin and doxycycline.     Increase activity slowly    Complete by:  As directed             Medication List         amoxicillin-clavulanate 875-125 MG per tablet  Commonly known as:  AUGMENTIN  Take 1 tablet by mouth 2 (two) times daily.     divalproex 500 MG 24 hr tablet  Commonly known as:  DEPAKOTE ER  Take 2,000 mg by mouth at bedtime.     doxycycline 100 MG tablet  Commonly known as:  VIBRA-TABS  Take 1 tablet (100 mg total) by mouth 2 (two) times daily.     FLUoxetine 40 MG capsule  Commonly known as:  PROZAC  Take 40 mg by mouth daily.     omeprazole 20 MG tablet  Commonly known as:  PRILOSEC OTC  Take 20 mg by mouth daily.     risperidone 4 MG tablet  Commonly known as:  RISPERDAL  Take 4 mg by mouth at bedtime.     SAPHRIS 5 MG Subl 24 hr tablet  Generic drug:  asenapine  Place 5 mg under the tongue 2 (two) times daily.           Follow-up Information   Follow up with Pcp Not In System.       The results of significant diagnostics from this hospitalization (including imaging, microbiology, ancillary and laboratory) are listed below for reference.    Significant Diagnostic Studies: Dg Ankle Complete Right  02/04/2014   CLINICAL DATA:  Right ankle swelling and pain.  EXAM: RIGHT ANKLE - COMPLETE 3+ VIEW  COMPARISON:  08/24/2012   FINDINGS: Soft tissue swelling is present.  There is no evidence of fracture, subluxation or dislocation.  No radiographic evidence of osteomyelitis is noted.  No focal bony lesions are present.  IMPRESSION: Soft tissue swelling without acute bony abnormality.   Electronically Signed   By: Hassan Rowan M.D.   On: 02/04/2014 09:23   Ct Foot Right W Contrast  02/07/2014   CLINICAL DATA:  Cellulitis from the right foot extending to the right calf. Swelling and redness. Diabetes.  EXAM: CT OF THE RIGHT FOOT WITH CONTRAST; CT OF THE RIGHT TIBIA AND FIBULA WITH CONTRAST  TECHNIQUE: Multidetector CT imaging was performed following the standard protocol during bolus administration of intravenous contrast.  CONTRAST:  43m OMNIPAQUE IOHEXOL 300 MG/ML  SOLN  COMPARISON:  None.  FINDINGS: There is subcutaneous edema extending from the foot proximally to the level of the knee. There is no soft tissue abscess. The anterior and posterior muscular compartments are within normal limits. Negative for osteomyelitis. Tibia and fibula appear normal. No ankle effusion is present. Accessory ossicles are present in the foot, incidentally noted including at the anterior process of the calcaneus. No ulcerations are identified. Calcaneal spurs are present. There is no radiopaque foreign body.  On the scout images, the contralateral leg is partially visualized and shows hardware compatible with an old ORIF. Neurovascular bundles appear within normal limits.  IMPRESSION: Cellulitis of the right foot and leg. No soft tissue abscess and no osteomyelitis.   Electronically Signed   By: GDereck LigasM.D.   On: 02/07/2014 12:27   Ct Tibia Fibula Right W Contrast  02/07/2014   CLINICAL DATA:  Cellulitis from the right foot extending to the right calf. Swelling and redness. Diabetes.  EXAM: CT OF THE RIGHT FOOT WITH CONTRAST; CT OF THE RIGHT TIBIA AND FIBULA WITH CONTRAST  TECHNIQUE: Multidetector CT imaging was performed following the standard  protocol during bolus administration of intravenous contrast.  CONTRAST:  81mOMNIPAQUE IOHEXOL 300 MG/ML  SOLN  COMPARISON:  None.  FINDINGS: There is subcutaneous edema extending from the foot proximally to the level of the knee. There is no soft tissue abscess. The anterior and posterior muscular compartments are within normal limits. Negative for osteomyelitis. Tibia and fibula appear normal. No ankle effusion is present. Accessory ossicles are present in the foot, incidentally noted including at the anterior process of the calcaneus. No ulcerations are identified. Calcaneal spurs are present. There is no radiopaque foreign body.  On the scout images, the contralateral leg is partially visualized and shows hardware compatible with an old ORIF. Neurovascular bundles appear within normal limits.  IMPRESSION: Cellulitis of the right foot and leg. No soft tissue abscess and no osteomyelitis.   Electronically Signed   By: GeDereck Ligas.D.   On: 02/07/2014 12:27    Microbiology: Recent Results (from the past 240 hour(s))  CULTURE, BLOOD (ROUTINE X 2)     Status: None   Collection Time    02/04/14  9:50 AM      Result Value Ref Range Status   Specimen Description BLOOD RIGHT ARM   Final   Special Requests BOTTLES DRAWN AEROBIC AND ANAEROBIC [place] Final   Culture  Setup Time     Final   Value: 02/04/2014 14:53     Performed at [facility] Culture     Final   Value:        BLOOD CULTURE RECEIVED NO GROWTH TO DATE CULTURE WILL BE HELD FOR 5 DAYS BEFORE ISSUING A FINAL NEGATIVE REPORT     Performed at [facility] Report Status PENDING   Incomplete  CULTURE, BLOOD (ROUTINE X 2)     Status: None   Collection Time    02/04/14  9:50 AM      Result Value Ref Range Status   Specimen Description BLOOD RIGHT HAND   Final   Special Requests BOTTLES DRAWN AEROBIC AND ANAEROBIC 5CC   Final   Culture  Setup Time     Final   Value: 02/04/2014 14:53     [facility]   Final  Value:        BLOOD CULTURE RECEIVED NO GROWTH TO DATE CULTURE WILL BE HELD FOR 5 DAYS BEFORE ISSUING A FINAL NEGATIVE REPORT     Performed at [facility]   Report Status PENDING   Incomplete     Labs: Basic Metabolic Panel:  Recent Labs Lab 02/04/14 0842 02/05/14 0459 02/06/14 0900 02/07/14 0525  NA 128* 135* 138 138  K 3.8 3.8 4.1 4.0  CL 91* 98 98 99  CO2 _0 GLUCOSE 178* 122* 174* 109*  BUN _1 CREATININE 0.73 0.77 0.65 0.60  CALCIUM 9.4 8.6 9.1 8.9   Liver Function Tests:  Recent Labs Lab 02/04/14 0842  AST 33  ALT 49  ALKPHOS 55  BILITOT 0.5  PROT 7.9  ALBUMIN 3.2*   No results found for this basename: LIPASE, AMYLASE,  in the last 168 hours No results found for this basename: AMMONIA,  in the last 168 hours CBC:  Recent Labs Lab 02/04/14 0842 02/05/14 0459 02/06/14 0900 02/07/14 0525  WBC 12.7* 11.2* 6.3 5.8  NEUTROABS 11.2*  --   --   --   HGB 13.6 12.4* 11.4* 12.5*  HCT 39.2 36.7* 34.0* 36.0*  MCV 91.2 93.6 95.2 93.8  PLT 159 158 169 200   Cardiac Enzymes: No results found for this basename: CKTOTAL, CKMB, CKMBINDEX, TROPONINI,  in the last 168 hours BNP: BNP (last 3 results) No results found for this basename: PROBNP,  in the last 8760 hours CBG:  Recent Labs Lab 02/07/14 0638 02/07/14 1216 02/07/14 1633 02/07/14 2222 02/08/14 0625  GLUCAP 117* 100* 152* 115* 105*    Time coordinating discharge: Over 30 minutes

## 2014-02-08 NOTE — Progress Notes (Signed)
ANTIBIOTIC CONSULT NOTE - FOLLOW UP  Pharmacy Consult for Vancomycin and Cefepime Indication: cellulitis  Allergies  Allergen Reactions  . Amoxicillin Nausea And Vomiting    Patient Measurements: Height: 6\' 3"  (190.5 cm) Weight: 280 lb (127.007 kg) IBW/kg (Calculated) : 84.5  Vital Signs: Temp: 98.2 F (36.8 C) (05/28 0440) BP: 145/83 mmHg (05/28 0440) Pulse Rate: 63 (05/28 0440) Intake/Output from previous day: 05/27 0701 - 05/28 0700 In: 3239.6 [P.O.:1600; I.V.:989.6; IV Piggyback:650] Out: 3450 [Urine:3450] Intake/Output from this shift: Total I/O In: 240 [P.O.:240] Out: 600 [Urine:600]  Labs:  Recent Labs  02/06/14 0900 02/07/14 0525  WBC 6.3 5.8  HGB 11.4* 12.5*  PLT 169 200  CREATININE 0.65 0.60   Estimated Creatinine Clearance: 176.2 ml/min (by C-G formula based on Cr of 0.6).  Recent Labs  02/08/14 0654  [place] <5.0*     Microbiology: Recent Results (from the past 720 hour(s))  CULTURE, BLOOD (ROUTINE X 2)     Status: None   Collection Time    02/04/14  9:50 AM      Result Value Ref Range Status   Specimen Description BLOOD RIGHT ARM   Final   Special Requests BOTTLES DRAWN AEROBIC AND ANAEROBIC [place]   Final   Culture  Setup Time     Final   Value: 02/04/2014 14:53     Performed at [facility]   Culture     Final   Value:        BLOOD CULTURE RECEIVED NO GROWTH TO DATE CULTURE WILL BE HELD FOR 5 DAYS BEFORE ISSUING A FINAL NEGATIVE REPORT     Performed at [facility]   Report Status PENDING   Incomplete  CULTURE, BLOOD (ROUTINE X 2)     Status: None   Collection Time    02/04/14  9:50 AM      Result Value Ref Range Status   Specimen Description BLOOD RIGHT HAND   Final   Special Requests BOTTLES DRAWN AEROBIC AND ANAEROBIC 5CC   Final   Culture  Setup Time     Final   Value: 02/04/2014 14:53     Performed at [facility]   Culture     Final   Value:        BLOOD CULTURE RECEIVED NO GROWTH TO DATE  CULTURE WILL BE HELD FOR 5 DAYS BEFORE ISSUING A FINAL NEGATIVE REPORT     Performed at [facility]   Report Status PENDING   Incomplete    Anti-infectives   Start     Dose/Rate Route Frequency Ordered Stop   02/04/14 2200  ceFEPIme (MAXIPIME) 1 g in dextrose 5 % 50 mL IVPB  Status:  Discontinued     1 g 100 mL/hr over 30 Minutes Intravenous 3 times per day 02/04/14 0849 02/04/14 1242   02/04/14 2000  ceFEPIme (MAXIPIME) 1 g in dextrose 5 % 50 mL IVPB     1 g 100 mL/hr over 30 Minutes Intravenous Every 8 hours 02/04/14 1242     02/04/14 2000  vancomycin (VANCOCIN) 1,250 mg in sodium chloride 0.9 % 250 mL IVPB     1,250 mg 166.7 mL/hr over 90 Minutes Intravenous Every 12 hours 02/04/14 1313     02/04/14 0930  ceFEPIme (MAXIPIME) 1 g in dextrose 5 % 50 mL IVPB     1 g 100 mL/hr over 30 Minutes Intravenous NOW 02/04/14 0849 02/04/14 1136   02/04/14 0845  vancomycin (VANCOCIN) IVPB 1000  mg/200 mL premix     1,000 mg 200 mL/hr over 60 Minutes Intravenous  Once 02/04/14 0830 02/04/14 1102   02/04/14 0830  vancomycin (VANCOCIN) injection 1 g  Status:  Discontinued     1 g Intravenous  Once 02/04/14 [id number] 02/04/14 [id number]      Assessment: 38 yo M continues on broad spectrum antibiotics for cellulitis.  Blood cx are NGTD.  Pt received a total of 8 doses of Vancomycin and should have been at a steady state level prior to trough this morning.  It appears that patient was not adequately loaded with an initial Vancomycin dose based on his obesity and as a result the trough is undetectable.  Will re-load patient now and increase standing regimen to q8h dosing.  Goal of Therapy:  Vancomycin trough level 10-15 mcg/ml  Plan:  Vancomycin 1250 mg IV x 1 now - to equal total 2500mg  dose (1250mg  was given at 0800 today). Vancomycin 1gm IV q8h to start at 1800 tonight. Will check Vancomycin trough at steady state if therapy continues. Continue Cefepime 1gm IV q8h.  [employer], Pharm.D.,  BCPS Clinical Pharmacist Pager [phone] 02/08/2014 10:30 AM

## 2014-02-10 LAB — CULTURE, BLOOD (ROUTINE X 2)
CULTURE: NO GROWTH
Culture: NO GROWTH

## 2014-02-17 ENCOUNTER — Inpatient Hospital Stay (HOSPITAL_COMMUNITY)
Admission: EM | Admit: 2014-02-17 | Discharge: 2014-02-23 | DRG: 603 | Disposition: A | Discharge status: Home / Self Care | Payer: Medicare Other | Attending: Family Medicine | Admitting: Family Medicine

## 2014-02-17 ENCOUNTER — Emergency Department (HOSPITAL_COMMUNITY): Payer: Medicare Other

## 2014-02-17 DIAGNOSIS — M79609 Pain in unspecified limb: Secondary | ICD-10-CM | POA: Diagnosis not present

## 2014-02-17 DIAGNOSIS — M60009 Infective myositis, unspecified site: Secondary | ICD-10-CM

## 2014-02-17 DIAGNOSIS — Z794 Long term (current) use of insulin: Secondary | ICD-10-CM

## 2014-02-17 DIAGNOSIS — Z881 Allergy status to other antibiotic agents status: Secondary | ICD-10-CM

## 2014-02-17 DIAGNOSIS — M928 Other specified juvenile osteochondrosis: Secondary | ICD-10-CM | POA: Diagnosis present

## 2014-02-17 DIAGNOSIS — L97509 Non-pressure chronic ulcer of other part of unspecified foot with unspecified severity: Secondary | ICD-10-CM | POA: Diagnosis present

## 2014-02-17 DIAGNOSIS — L02619 Cutaneous abscess of unspecified foot: Secondary | ICD-10-CM | POA: Diagnosis present

## 2014-02-17 DIAGNOSIS — L03119 Cellulitis of unspecified part of limb: Secondary | ICD-10-CM | POA: Diagnosis not present

## 2014-02-17 DIAGNOSIS — L97519 Non-pressure chronic ulcer of other part of right foot with unspecified severity: Secondary | ICD-10-CM

## 2014-02-17 DIAGNOSIS — E119 Type 2 diabetes mellitus without complications: Secondary | ICD-10-CM

## 2014-02-17 DIAGNOSIS — A419 Sepsis, unspecified organism: Secondary | ICD-10-CM

## 2014-02-17 DIAGNOSIS — Z79899 Other long term (current) drug therapy: Secondary | ICD-10-CM

## 2014-02-17 DIAGNOSIS — L03115 Cellulitis of right lower limb: Secondary | ICD-10-CM

## 2014-02-17 DIAGNOSIS — I749 Embolism and thrombosis of unspecified artery: Secondary | ICD-10-CM

## 2014-02-17 DIAGNOSIS — M9270 Juvenile osteochondrosis of metatarsus, unspecified foot: Secondary | ICD-10-CM | POA: Diagnosis present

## 2014-02-17 DIAGNOSIS — Z113 Encounter for screening for infections with a predominantly sexual mode of transmission: Secondary | ICD-10-CM

## 2014-02-17 DIAGNOSIS — L0390 Cellulitis, unspecified: Secondary | ICD-10-CM

## 2014-02-17 DIAGNOSIS — R509 Fever, unspecified: Secondary | ICD-10-CM

## 2014-02-17 DIAGNOSIS — E11621 Type 2 diabetes mellitus with foot ulcer: Secondary | ICD-10-CM

## 2014-02-17 DIAGNOSIS — R609 Edema, unspecified: Secondary | ICD-10-CM | POA: Diagnosis present

## 2014-02-17 DIAGNOSIS — E1169 Type 2 diabetes mellitus with other specified complication: Secondary | ICD-10-CM | POA: Diagnosis present

## 2014-02-17 DIAGNOSIS — Z6835 Body mass index (BMI) 35.0-35.9, adult: Secondary | ICD-10-CM

## 2014-02-17 DIAGNOSIS — R600 Localized edema: Secondary | ICD-10-CM

## 2014-02-17 DIAGNOSIS — F319 Bipolar disorder, unspecified: Secondary | ICD-10-CM

## 2014-02-17 DIAGNOSIS — N390 Urinary tract infection, site not specified: Secondary | ICD-10-CM

## 2014-02-17 DIAGNOSIS — L02419 Cutaneous abscess of limb, unspecified: Secondary | ICD-10-CM | POA: Diagnosis not present

## 2014-02-17 DIAGNOSIS — F17200 Nicotine dependence, unspecified, uncomplicated: Secondary | ICD-10-CM | POA: Diagnosis present

## 2014-02-17 DIAGNOSIS — R6889 Other general symptoms and signs: Secondary | ICD-10-CM | POA: Diagnosis not present

## 2014-02-17 LAB — CBC WITH DIFFERENTIAL/PLATELET
BASOS ABS: 0 10*3/uL (ref 0.0–0.1)
BASOS PCT: 0 % (ref 0–1)
Eosinophils Absolute: 0 10*3/uL (ref 0.0–0.7)
Eosinophils Relative: 0 % (ref 0–5)
HCT: 37.9 % — ABNORMAL LOW (ref 39.0–52.0)
Hemoglobin: 13.4 g/dL (ref 13.0–17.0)
LYMPHS PCT: 16 % (ref 12–46)
Lymphs Abs: 1.4 10*3/uL (ref 0.7–4.0)
MCH: 32 pg (ref 26.0–34.0)
MCHC: 35.4 g/dL (ref 30.0–36.0)
MCV: 90.5 fL (ref 78.0–100.0)
Monocytes Absolute: 0.8 10*3/uL (ref 0.1–1.0)
Monocytes Relative: 9 % (ref 3–12)
NEUTROS ABS: 6.7 10*3/uL (ref 1.7–7.7)
Neutrophils Relative %: 75 % (ref 43–77)
Platelets: 267 10*3/uL (ref 150–400)
RBC: 4.19 MIL/uL — ABNORMAL LOW (ref 4.22–5.81)
RDW: 12.7 % (ref 11.5–15.5)
WBC: 8.9 10*3/uL (ref 4.0–10.5)

## 2014-02-17 LAB — BASIC METABOLIC PANEL
BUN: 17 mg/dL (ref 6–23)
CALCIUM: 9.3 mg/dL (ref 8.4–10.5)
CO2: 25 mEq/L (ref 19–32)
Chloride: 94 mEq/L — ABNORMAL LOW (ref 96–112)
Creatinine, Ser: 0.67 mg/dL (ref 0.50–1.35)
GFR calc non Af Amer: >90 mL/min (ref >90)
Glucose, Bld: 130 mg/dL — ABNORMAL HIGH (ref 70–99)
Potassium: 4.1 mEq/L (ref 3.7–5.3)
Sodium: 133 mEq/L — ABNORMAL LOW (ref 137–147)

## 2014-02-17 LAB — CBG MONITORING, ED: Glucose-Capillary: 156 mg/dL — ABNORMAL HIGH (ref 70–99)

## 2014-02-17 LAB — I-STAT CG4 LACTIC ACID, ED: LACTIC ACID, VENOUS: 2.49 mmol/L — AB (ref 0.5–2.2)

## 2014-02-17 MED ORDER — VANCOMYCIN HCL 10 G IV SOLR
1250.0000 mg | Freq: Two times a day (BID) | INTRAVENOUS | Status: DC
  Administered 2014-02-18 – 2014-02-19 (×3): 1250 mg via INTRAVENOUS
  Filled 2014-02-17 (×4): qty 1250

## 2014-02-17 MED ORDER — SODIUM CHLORIDE 0.9 % IV BOLUS (SEPSIS)
1000.0000 mL | Freq: Once | INTRAVENOUS | Status: AC
  Administered 2014-02-17: 1000 mL via INTRAVENOUS

## 2014-02-17 MED ORDER — ACETAMINOPHEN 500 MG PO TABS
1000.0000 mg | ORAL_TABLET | Freq: Once | ORAL | Status: AC
  Administered 2014-02-17: 1000 mg via ORAL
  Filled 2014-02-17: qty 2

## 2014-02-17 MED ORDER — HYDROMORPHONE HCL PF 1 MG/ML IJ SOLN
1.0000 mg | Freq: Once | INTRAMUSCULAR | Status: AC
  Administered 2014-02-17: 1 mg via INTRAVENOUS
  Filled 2014-02-17: qty 1

## 2014-02-17 MED ORDER — VANCOMYCIN HCL 10 G IV SOLR
2500.0000 mg | Freq: Once | INTRAVENOUS | Status: AC
  Administered 2014-02-17: 2500 mg via INTRAVENOUS
  Filled 2014-02-17: qty 2500

## 2014-02-17 NOTE — ED Notes (Addendum)
Per EMS- patient was released from [facility] on 5/28. Fever 101.1. VS BP 200/120 HR 120. C/o dizziness. Reports of seeping wound on left inner ankle. Was given Doxycycline. Reports wound "keeps getting worse" and keeps swelling. Reports first three toes on right foot are "purple and sloughing" per EMS. No hx DM. Only hx bipolar. Takes depakote, prilosec, prozac, safras. Allergic to amoxicillin.

## 2014-02-17 NOTE — ED Notes (Signed)
Initial contact-A&O x4. Per patient RLE started swelling x2 days ago. Has been hospitalized 4x for recurring cellulitis. Recently hospitalized on 5/28 and given Doxycycline. No wound therapy done. Sent home and self-treating wound with soap and water two times a day. Denies chest pain but has had dizziness with no passing out. Has had fever with tmax 100.0. No diarrhea but has had nausea with no vomiting. RLE warm to touch and 2+ pitting edema. Redness noted. Able to move all toes and extend and flex foot. Wound noted on inner ankle with yellow slough. Wounds also noted on second, third, and fourth toes purple in color with yellow slough. Pt does have DM II and manages with diet. Reports his Hgb A1C is "always ok." In NAD. Hooked up to EKG immediately. No other complaints at this time.

## 2014-02-17 NOTE — ED Provider Notes (Signed)
CSN: [id number]     Arrival date & time 02/17/14  2121 History   First MD Initiated Contact with Patient 02/17/14 2124     Chief Complaint  Patient presents with  . Cellulitis    recurrent     (Consider location/radiation/quality/duration/timing/severity/associated sxs/prior Treatment) HPI Comments: Patient presents emergency department with chief complaint of right leg cellulitis. He states that he is been having this problem for the past several weeks. He has been recently admitted for the same problem. He states that his discharge, and the redness worsened. He states that he has had a fever to 101.1. States that he does have subjective fevers and chills. He complains of feeling weak and tired, and a little dizzy. He states that he does not know how the infection originally started. He is diabetic.  The history is provided by the patient. No language interpreter was used.    Past Medical History  Diagnosis Date  . Type II diabetes mellitus ~ 2009    "take Metformin" (08/24/2012)  . Bipolar 1 disorder   . Cellulitis 08/24/2012    "RLE and spot on my right forearm" (08/24/2012)   Past Surgical History  Procedure Laterality Date  . Open anterior shoulder reconstruction  ~ 2003    "right; kept dislocating; cut it open; sewed muscle back together; moved cartilage around and stapled that" (08/24/2012)  . Foot fracture surgery      "left; scaffold broke" 08/24/2012)   Family History  Problem Relation Age of Onset  . Diabetic kidney disease Maternal Uncle    History  Substance Use Topics  . Smoking status: Current Every Day Smoker -- 1.00 packs/day for 25 years    Types: Cigarettes  . Smokeless tobacco: Never Used     Comment: 08/24/2012 "stopped both snuff and chew ~ 10 yr ago"  . Alcohol Use: 0.0 oz/week    Review of Systems  Constitutional: Negative for fever and chills.  Respiratory: Negative for shortness of breath.   Cardiovascular: Negative for chest pain.   Gastrointestinal: Negative for nausea, vomiting, diarrhea and constipation.  Genitourinary: Negative for dysuria.  Skin: Positive for color change, rash and wound.       Cellulitis right lower extremity      Allergies  Amoxicillin  Home Medications   Prior to Admission medications   Medication Sig Start Date End Date Taking? Authorizing Provider  amoxicillin-clavulanate (AUGMENTIN) 875-125 MG per tablet Take 1 tablet by mouth 2 (two) times daily. 02/08/14   Robbie Lis, MD  asenapine (SAPHRIS) 5 MG SUBL Place 5 mg under the tongue 2 (two) times daily.    Historical Provider, MD  divalproex (DEPAKOTE ER) 500 MG 24 hr tablet Take 2,000 mg by mouth at bedtime.    Historical Provider, MD  doxycycline (VIBRA-TABS) 100 MG tablet Take 1 tablet (100 mg total) by mouth 2 (two) times daily. 02/08/14   Robbie Lis, MD  FLUoxetine (PROZAC) 40 MG capsule Take 40 mg by mouth daily.    Historical Provider, MD  omeprazole (PRILOSEC OTC) 20 MG tablet Take 20 mg by mouth daily.    Historical Provider, MD  risperidone (RISPERDAL) 4 MG tablet Take 4 mg by mouth at bedtime.     Historical Provider, MD   BP 158/89  Pulse 86  Temp(Src) 99 F (37.2 C) (Oral)  Resp 16  Ht 6\' 3"  (1.905 m)  Wt 285 lb (129.275 kg)  BMI 35.62 kg/m2  SpO2 97% Physical Exam  Nursing note and vitals  reviewed. Constitutional: He is oriented to person, place, and time. He appears well-developed and well-nourished.  HENT:  Head: Normocephalic and atraumatic.  Eyes: Conjunctivae and EOM are normal. Pupils are equal, round, and reactive to light. Right eye exhibits no discharge. Left eye exhibits no discharge. No scleral icterus.  Neck: Normal range of motion. Neck supple. No JVD present.  Cardiovascular: Normal rate, regular rhythm and normal heart sounds.  Exam reveals no gallop and no friction rub.   No murmur heard. Intact distal pulses and brisk capillary refill  Pulmonary/Chest: Effort normal and breath sounds normal.  No respiratory distress. He has no wheezes. He has no rales. He exhibits no tenderness.  Abdominal: Soft. He exhibits no distension.  Musculoskeletal: Normal range of motion. He exhibits no edema and no tenderness.  Neurological: He is alert and oriented to person, place, and time.  Skin: Skin is warm and dry.  Right lower extremity cellulitis, involving the foot, and lower leg, there is some sloughing of the skin about the toes  Psychiatric: He has a normal mood and affect. His behavior is normal. Judgment and thought content normal.    ED Course  Procedures (including critical care time) Results for orders placed during the hospital encounter of 02/17/14  CBC WITH DIFFERENTIAL      Result Value Ref Range   WBC 8.9  4.0 - 10.5 K/uL   RBC 4.19 (*) 4.22 - 5.81 MIL/uL   Hemoglobin 13.4  13.0 - 17.0 g/dL   HCT 37.9 (*) 39.0 - 52.0 %   MCV 90.5  78.0 - 100.0 fL   MCH 32.0  26.0 - 34.0 pg   MCHC 35.4  30.0 - 36.0 g/dL   RDW 12.7  11.5 - 15.5 %   Platelets 267  150 - 400 K/uL   Neutrophils Relative % 75  43 - 77 %   Neutro Abs 6.7  1.7 - 7.7 K/uL   Lymphocytes Relative 16  12 - 46 %   Lymphs Abs 1.4  0.7 - 4.0 K/uL   Monocytes Relative 9  3 - 12 %   Monocytes Absolute 0.8  0.1 - 1.0 K/uL   Eosinophils Relative 0  0 - 5 %   Eosinophils Absolute 0.0  0.0 - 0.7 K/uL   Basophils Relative 0  0 - 1 %   Basophils Absolute 0.0  0.0 - 0.1 K/uL  BASIC METABOLIC PANEL      Result Value Ref Range   Sodium 133 (*) 137 - 147 mEq/L   Potassium 4.1  3.7 - 5.3 mEq/L   Chloride 94 (*) 96 - 112 mEq/L   CO2 25  19 - 32 mEq/L   Glucose, Bld 130 (*) 70 - 99 mg/dL   BUN 17  6 - 23 mg/dL   Creatinine, Ser 0.67  0.50 - 1.35 mg/dL   Calcium 9.3  8.4 - 10.5 mg/dL   GFR calc non Af Amer >90  >90 mL/min   GFR calc Af Amer >90  >90 mL/min  CBG MONITORING, ED      Result Value Ref Range   Glucose-Capillary 156 (*) 70 - 99 mg/dL   Comment 1 Notify RN    I-STAT CG4 LACTIC ACID, ED      Result Value Ref  Range   Lactic Acid, Venous 2.49 (*) 0.5 - 2.2 mmol/L   Dg Ankle Complete Right  02/04/2014   CLINICAL DATA:  Right ankle swelling and pain.  EXAM: RIGHT ANKLE - COMPLETE 3+  VIEW  COMPARISON:  08/24/2012  FINDINGS: Soft tissue swelling is present.  There is no evidence of fracture, subluxation or dislocation.  No radiographic evidence of osteomyelitis is noted.  No focal bony lesions are present.  IMPRESSION: Soft tissue swelling without acute bony abnormality.   Electronically Signed   By: Hassan Rowan M.D.   On: 02/04/2014 09:23   Ct Foot Right W Contrast  02/07/2014   CLINICAL DATA:  Cellulitis from the right foot extending to the right calf. Swelling and redness. Diabetes.  EXAM: CT OF THE RIGHT FOOT WITH CONTRAST; CT OF THE RIGHT TIBIA AND FIBULA WITH CONTRAST  TECHNIQUE: Multidetector CT imaging was performed following the standard protocol during bolus administration of intravenous contrast.  CONTRAST:  72mL OMNIPAQUE IOHEXOL 300 MG/ML  SOLN  COMPARISON:  None.  FINDINGS: There is subcutaneous edema extending from the foot proximally to the level of the knee. There is no soft tissue abscess. The anterior and posterior muscular compartments are within normal limits. Negative for osteomyelitis. Tibia and fibula appear normal. No ankle effusion is present. Accessory ossicles are present in the foot, incidentally noted including at the anterior process of the calcaneus. No ulcerations are identified. Calcaneal spurs are present. There is no radiopaque foreign body.  On the scout images, the contralateral leg is partially visualized and shows hardware compatible with an old ORIF. Neurovascular bundles appear within normal limits.  IMPRESSION: Cellulitis of the right foot and leg. No soft tissue abscess and no osteomyelitis.   Electronically Signed   By: Dereck Ligas M.D.   On: 02/07/2014 12:27   Ct Tibia Fibula Right W Contrast  02/07/2014   CLINICAL DATA:  Cellulitis from the right foot extending to the  right calf. Swelling and redness. Diabetes.  EXAM: CT OF THE RIGHT FOOT WITH CONTRAST; CT OF THE RIGHT TIBIA AND FIBULA WITH CONTRAST  TECHNIQUE: Multidetector CT imaging was performed following the standard protocol during bolus administration of intravenous contrast.  CONTRAST:  10mL OMNIPAQUE IOHEXOL 300 MG/ML  SOLN  COMPARISON:  None.  FINDINGS: There is subcutaneous edema extending from the foot proximally to the level of the knee. There is no soft tissue abscess. The anterior and posterior muscular compartments are within normal limits. Negative for osteomyelitis. Tibia and fibula appear normal. No ankle effusion is present. Accessory ossicles are present in the foot, incidentally noted including at the anterior process of the calcaneus. No ulcerations are identified. Calcaneal spurs are present. There is no radiopaque foreign body.  On the scout images, the contralateral leg is partially visualized and shows hardware compatible with an old ORIF. Neurovascular bundles appear within normal limits.  IMPRESSION: Cellulitis of the right foot and leg. No soft tissue abscess and no osteomyelitis.   Electronically Signed   By: Dereck Ligas M.D.   On: 02/07/2014 12:27   Dg Foot Complete Right  02/17/2014   CLINICAL DATA:  Cellulitis.  EXAM: RIGHT FOOT COMPLETE - 3+ VIEW  COMPARISON:  Right foot CT Feb 07, 2014.  FINDINGS: No acute fracture deformity or dislocation. Remote fracture of the distal aspect of the fifth proximal phalanx with apparent nonunion, unchanged. Diffuse soft tissue swelling without subcutaneous gas or radiopaque foreign bodies. Mild first metatarsophalangeal osteoarthrosis. Type 2 navicular bone.  IMPRESSION: Soft tissue swelling consistent with history of cellulitis, without subcutaneous gas. No radiographic findings of osteomyelitis.   Electronically Signed   By: Elon Alas   On: 02/17/2014 23:12      EKG Interpretation None  MDM   Final diagnoses:  Cellulitis of right  lower leg    Patient with obvious cellulitis. Will admit to medicine. He'll need IV antibiotics. Will check basic labs, get blood cultures.  11:39 PM Will admit to medicine.  Patient seen by and discussed with Dr. Doy Mince, who agrees with the plan.    Montine Circle, PA-C 02/18/14 [phone]

## 2014-02-17 NOTE — ED Notes (Signed)
Bed: WA17 Expected date: 02/17/14 Expected time: 9:13 PM Means of arrival: Ambulance Comments: Cellulitis of leg

## 2014-02-17 NOTE — Progress Notes (Signed)
ANTIBIOTIC CONSULT NOTE - INITIAL  Pharmacy Consult for Vancomycin Indication: LE cellulitis  Allergies  Allergen Reactions  . Amoxicillin Nausea And Vomiting    Patient Measurements: Height: 6\' 3"  (190.5 cm) Weight: 285 lb (129.275 kg) IBW/kg (Calculated) : 84.5   Vital Signs: Temp: 102 F (38.9 C) (06/06 2252) Temp src: Rectal (06/06 2252) BP: 131/64 mmHg (06/06 2303) Pulse Rate: 77 (06/06 2303) Intake/Output from previous day:   Intake/Output from this shift:    Labs:  Recent Labs  02/17/14 2206  WBC 8.9  HGB 13.4  PLT 267  CREATININE 0.67   Estimated Creatinine Clearance: 177.8 ml/min (by C-G formula based on Cr of 0.67). No results found for this basename: VANCOTROUGH, VANCOPEAK, VANCORANDOM, [place], GENTPEAK, [place], [place], [place], TOBRARND, AMIKACINPEAK, AMIKACINTROU, AMIKACIN,  in the last 72 hours   Microbiology: Recent Results (from the past 720 hour(s))  CULTURE, BLOOD (ROUTINE X 2)     Status: None   Collection Time    02/04/14  9:50 AM      Result Value Ref Range Status   Specimen Description BLOOD RIGHT ARM   Final   Special Requests BOTTLES DRAWN AEROBIC AND ANAEROBIC [place], [place]   Final   Culture  Setup Time     Final   Value: 02/04/2014 14:53     Performed at [facility]   Culture     Final   Value: NO GROWTH 5 DAYS     Performed at [facility]   Report Status 02/10/2014 FINAL   Final  CULTURE, BLOOD (ROUTINE X 2)     Status: None   Collection Time    02/04/14  9:50 AM      Result Value Ref Range Status   Specimen Description BLOOD RIGHT HAND   Final   Special Requests BOTTLES DRAWN AEROBIC AND ANAEROBIC 5CC   Final   Culture  Setup Time     Final   Value: 02/04/2014 14:53     Performed at [facility]   Culture     Final   Value: NO GROWTH 5 DAYS     Performed at [facility]   Report Status 02/10/2014 FINAL   Final    Medical History: Past Medical History  Diagnosis Date   . Type II diabetes mellitus ~ 2009    "take Metformin" (08/24/2012)  . Bipolar 1 disorder   . Cellulitis 08/24/2012    "RLE and spot on my right forearm" (08/24/2012)    Medications:  Scheduled:   Infusions:  . [START ON 02/18/2014] vancomycin    . vancomycin 2,500 mg (02/17/14 2252)   Assessment: 38 yo d/c'd from [facility] 5/28 presents with increased swelling, weeping of LE wound.  Pt on Doxy outpt, but wound worsening.  Vancomycin per Rx for LE cellulitis.   Goal of Therapy:  Vancomycin trough level 10-15 mcg/ml  Plan:   Vancomycin 2500mg  x1 then 1250mg  IV q12h  F/U SCr/levels/cultures as needed  Dorrene German 02/17/2014,11:11 PM

## 2014-02-18 ENCOUNTER — Encounter (HOSPITAL_COMMUNITY): Payer: Self-pay | Admitting: *Deleted

## 2014-02-18 DIAGNOSIS — K4090 Unilateral inguinal hernia, without obstruction or gangrene, not specified as recurrent: Secondary | ICD-10-CM | POA: Diagnosis not present

## 2014-02-18 DIAGNOSIS — E119 Type 2 diabetes mellitus without complications: Secondary | ICD-10-CM

## 2014-02-18 DIAGNOSIS — L02619 Cutaneous abscess of unspecified foot: Secondary | ICD-10-CM

## 2014-02-18 DIAGNOSIS — Z881 Allergy status to other antibiotic agents status: Secondary | ICD-10-CM | POA: Diagnosis not present

## 2014-02-18 DIAGNOSIS — L02419 Cutaneous abscess of limb, unspecified: Secondary | ICD-10-CM | POA: Diagnosis not present

## 2014-02-18 DIAGNOSIS — L97509 Non-pressure chronic ulcer of other part of unspecified foot with unspecified severity: Secondary | ICD-10-CM

## 2014-02-18 DIAGNOSIS — R609 Edema, unspecified: Secondary | ICD-10-CM | POA: Diagnosis present

## 2014-02-18 DIAGNOSIS — M7981 Nontraumatic hematoma of soft tissue: Secondary | ICD-10-CM | POA: Diagnosis not present

## 2014-02-18 DIAGNOSIS — E1169 Type 2 diabetes mellitus with other specified complication: Secondary | ICD-10-CM | POA: Diagnosis present

## 2014-02-18 DIAGNOSIS — F319 Bipolar disorder, unspecified: Secondary | ICD-10-CM | POA: Diagnosis not present

## 2014-02-18 DIAGNOSIS — Z79899 Other long term (current) drug therapy: Secondary | ICD-10-CM | POA: Diagnosis not present

## 2014-02-18 DIAGNOSIS — M7989 Other specified soft tissue disorders: Secondary | ICD-10-CM | POA: Diagnosis not present

## 2014-02-18 DIAGNOSIS — M79609 Pain in unspecified limb: Secondary | ICD-10-CM | POA: Diagnosis not present

## 2014-02-18 DIAGNOSIS — M928 Other specified juvenile osteochondrosis: Secondary | ICD-10-CM | POA: Diagnosis present

## 2014-02-18 DIAGNOSIS — E11621 Type 2 diabetes mellitus with foot ulcer: Secondary | ICD-10-CM | POA: Diagnosis present

## 2014-02-18 DIAGNOSIS — R509 Fever, unspecified: Secondary | ICD-10-CM

## 2014-02-18 DIAGNOSIS — I517 Cardiomegaly: Secondary | ICD-10-CM | POA: Diagnosis not present

## 2014-02-18 DIAGNOSIS — L03119 Cellulitis of unspecified part of limb: Principal | ICD-10-CM

## 2014-02-18 DIAGNOSIS — Z794 Long term (current) use of insulin: Secondary | ICD-10-CM | POA: Diagnosis not present

## 2014-02-18 DIAGNOSIS — F17200 Nicotine dependence, unspecified, uncomplicated: Secondary | ICD-10-CM | POA: Diagnosis present

## 2014-02-18 DIAGNOSIS — L97519 Non-pressure chronic ulcer of other part of right foot with unspecified severity: Secondary | ICD-10-CM

## 2014-02-18 DIAGNOSIS — Z6835 Body mass index (BMI) 35.0-35.9, adult: Secondary | ICD-10-CM | POA: Diagnosis not present

## 2014-02-18 LAB — GLUCOSE, CAPILLARY
GLUCOSE-CAPILLARY: 123 mg/dL — AB (ref 70–99)
Glucose-Capillary: 130 mg/dL — ABNORMAL HIGH (ref 70–99)
Glucose-Capillary: 165 mg/dL — ABNORMAL HIGH (ref 70–99)
Glucose-Capillary: 132 mg/dL — ABNORMAL HIGH (ref 70–99)

## 2014-02-18 LAB — BASIC METABOLIC PANEL WITH GFR
BUN: 13 mg/dL (ref 6–23)
CO2: 26 meq/L (ref 19–32)
Calcium: 8.7 mg/dL (ref 8.4–10.5)
Chloride: 96 meq/L (ref 96–112)
Creatinine, Ser: 0.74 mg/dL (ref 0.50–1.35)
GFR calc Af Amer: >90 mL/min
GFR calc non Af Amer: >90 mL/min
Glucose, Bld: 118 mg/dL — ABNORMAL HIGH (ref 70–99)
Potassium: 3.8 meq/L (ref 3.7–5.3)
Sodium: 136 meq/L — ABNORMAL LOW (ref 137–147)

## 2014-02-18 LAB — CBC
HCT: 33.9 % — ABNORMAL LOW (ref 39.0–52.0)
Hemoglobin: 12 g/dL — ABNORMAL LOW (ref 13.0–17.0)
MCH: 32.2 pg (ref 26.0–34.0)
MCHC: 35.4 g/dL (ref 30.0–36.0)
MCV: 90.9 fL (ref 78.0–100.0)
Platelets: 212 K/uL (ref 150–400)
RBC: 3.73 MIL/uL — ABNORMAL LOW (ref 4.22–5.81)
RDW: 12.8 % (ref 11.5–15.5)
WBC: 6.9 K/uL (ref 4.0–10.5)

## 2014-02-18 MED ORDER — ASENAPINE MALEATE 5 MG SL SUBL
5.0000 mg | SUBLINGUAL_TABLET | Freq: Two times a day (BID) | SUBLINGUAL | Status: DC
  Administered 2014-02-18 – 2014-02-22 (×11): 5 mg via SUBLINGUAL
  Filled 2014-02-18 (×13): qty 1

## 2014-02-18 MED ORDER — PANTOPRAZOLE SODIUM 40 MG PO TBEC
40.0000 mg | DELAYED_RELEASE_TABLET | Freq: Every day | ORAL | Status: DC
  Administered 2014-02-18 – 2014-02-22 (×5): 40 mg via ORAL
  Filled 2014-02-18 (×6): qty 1

## 2014-02-18 MED ORDER — ACETAMINOPHEN 650 MG RE SUPP: 650.0000 mg | Freq: Four times a day (QID) | RECTAL | Status: DC | PRN

## 2014-02-18 MED ORDER — ONDANSETRON HCL 4 MG/2ML IJ SOLN
4.0000 mg | Freq: Four times a day (QID) | INTRAMUSCULAR | Status: DC | PRN
  Administered 2014-02-21: 4 mg via INTRAVENOUS
  Filled 2014-02-18: qty 2

## 2014-02-18 MED ORDER — INSULIN ASPART 100 UNIT/ML ~~LOC~~ SOLN
0.0000 [IU] | Freq: Three times a day (TID) | SUBCUTANEOUS | Status: DC
  Administered 2014-02-18: 1 [IU] via SUBCUTANEOUS
  Administered 2014-02-18: 3 [IU] via SUBCUTANEOUS
  Administered 2014-02-18 – 2014-02-19 (×3): 1 [IU] via SUBCUTANEOUS
  Administered 2014-02-22: 2 [IU] via SUBCUTANEOUS

## 2014-02-18 MED ORDER — OMEPRAZOLE MAGNESIUM 20 MG PO TBEC: 20.0000 mg | DELAYED_RELEASE_TABLET | Freq: Every day | ORAL | Status: DC

## 2014-02-18 MED ORDER — RISPERIDONE 2 MG PO TABS
4.0000 mg | ORAL_TABLET | Freq: Every day | ORAL | Status: DC
  Administered 2014-02-18 – 2014-02-22 (×5): 4 mg via ORAL
  Filled 2014-02-18 (×6): qty 2

## 2014-02-18 MED ORDER — ENOXAPARIN SODIUM 60 MG/0.6ML ~~LOC~~ SOLN
60.0000 mg | Freq: Every day | SUBCUTANEOUS | Status: DC
  Administered 2014-02-18 – 2014-02-22 (×5): 60 mg via SUBCUTANEOUS
  Filled 2014-02-18 (×6): qty 0.6

## 2014-02-18 MED ORDER — CIPROFLOXACIN IN D5W 400 MG/200ML IV SOLN
400.0000 mg | Freq: Two times a day (BID) | INTRAVENOUS | Status: DC
  Administered 2014-02-18: 400 mg via INTRAVENOUS
  Filled 2014-02-18 (×2): qty 200

## 2014-02-18 MED ORDER — ACETAMINOPHEN 325 MG PO TABS
650.0000 mg | ORAL_TABLET | Freq: Four times a day (QID) | ORAL | Status: DC | PRN
  Administered 2014-02-19 – 2014-02-21 (×3): 650 mg via ORAL
  Filled 2014-02-18 (×3): qty 2

## 2014-02-18 MED ORDER — FLUOXETINE HCL 20 MG PO CAPS
40.0000 mg | ORAL_CAPSULE | Freq: Every day | ORAL | Status: DC
  Administered 2014-02-18 – 2014-02-20 (×3): 40 mg via ORAL
  Filled 2014-02-18 (×3): qty 2

## 2014-02-18 MED ORDER — ONDANSETRON HCL 4 MG PO TABS
4.0000 mg | ORAL_TABLET | Freq: Four times a day (QID) | ORAL | Status: DC | PRN
  Administered 2014-02-22: 4 mg via ORAL
  Filled 2014-02-18: qty 1

## 2014-02-18 MED ORDER — PIPERACILLIN-TAZOBACTAM 3.375 G IVPB
3.3750 g | Freq: Three times a day (TID) | INTRAVENOUS | Status: DC
  Administered 2014-02-18 – 2014-02-21 (×10): 3.375 g via INTRAVENOUS
  Filled 2014-02-18 (×11): qty 50

## 2014-02-18 MED ORDER — OXYCODONE HCL 5 MG PO TABS
5.0000 mg | ORAL_TABLET | ORAL | Status: DC | PRN
  Administered 2014-02-18 – 2014-02-22 (×12): 5 mg via ORAL
  Filled 2014-02-18 (×12): qty 1

## 2014-02-18 MED ORDER — ALUM & MAG HYDROXIDE-SIMETH 200-200-20 MG/5ML PO SUSP: 30.0000 mL | Freq: Four times a day (QID) | ORAL | Status: DC | PRN

## 2014-02-18 MED ORDER — ENOXAPARIN SODIUM 40 MG/0.4ML ~~LOC~~ SOLN: 40.0000 mg | SUBCUTANEOUS | Status: DC

## 2014-02-18 MED ORDER — INSULIN ASPART 100 UNIT/ML ~~LOC~~ SOLN: 0.0000 [IU] | Freq: Every day | SUBCUTANEOUS | Status: DC

## 2014-02-18 MED ORDER — SODIUM CHLORIDE 0.9 % IV SOLN
INTRAVENOUS | Status: DC
  Administered 2014-02-18 – 2014-02-20 (×7): via INTRAVENOUS
  Administered 2014-02-21: 100 mL/h via INTRAVENOUS
  Administered 2014-02-22: 06:00:00 via INTRAVENOUS

## 2014-02-18 MED ORDER — HYDROMORPHONE HCL PF 1 MG/ML IJ SOLN
0.5000 mg | INTRAMUSCULAR | Status: DC | PRN
  Administered 2014-02-18 – 2014-02-20 (×16): 1 mg via INTRAVENOUS
  Filled 2014-02-18 (×17): qty 1

## 2014-02-18 MED ORDER — DIVALPROEX SODIUM ER 500 MG PO TB24
2000.0000 mg | ORAL_TABLET | Freq: Every day | ORAL | Status: DC
  Administered 2014-02-18 – 2014-02-22 (×5): 2000 mg via ORAL
  Filled 2014-02-18 (×6): qty 4

## 2014-02-18 NOTE — Progress Notes (Signed)
ANTIBIOTIC CONSULT NOTE - INITIAL  Pharmacy Consult for Vancomycin/Zosyn Indication: Cellulitis/wound in diabetic foot   Allergies  Allergen Reactions  . Amoxicillin Nausea And Vomiting    Patient Measurements: Height: 6\' 3"  (190.5 cm) Weight: 285 lb (129.275 kg) (stated ) IBW/kg (Calculated) : 84.5   Vital Signs: Temp: 97.8 F (36.6 C) (06/07 0541) Temp src: Oral (06/07 0541) BP: 120/74 mmHg (06/07 0541) Pulse Rate: 62 (06/07 0541) Intake/Output from previous day: 06/06 0701 - 06/07 0700 In: 1051.7 [I.V.:351.7; IV Piggyback:700] Out: [id number] [Urine:1225] Intake/Output from this shift:    Labs:  Recent Labs  02/17/14 2206 02/18/14 0530  WBC 8.9 6.9  HGB 13.4 12.0*  PLT 267 212  CREATININE 0.67 0.74   Estimated Creatinine Clearance: 177.8 ml/min (by C-G formula based on Cr of 0.74). No results found for this basename: VANCOTROUGH, VANCOPEAK, VANCORANDOM, [place], GENTPEAK, [place], [place], [place], TOBRARND, AMIKACINPEAK, AMIKACINTROU, AMIKACIN,  in the last 72 hours   Microbiology: Recent Results (from the past 720 hour(s))  CULTURE, BLOOD (ROUTINE X 2)     Status: None   Collection Time    02/04/14  9:50 AM      Result Value Ref Range Status   Specimen Description BLOOD RIGHT ARM   Final   Special Requests BOTTLES DRAWN AEROBIC AND ANAEROBIC [place], [place]   Final   Culture  Setup Time     Final   Value: 02/04/2014 14:53     Performed at [facility]   Culture     Final   Value: NO GROWTH 5 DAYS     Performed at [facility]   Report Status 02/10/2014 FINAL   Final  CULTURE, BLOOD (ROUTINE X 2)     Status: None   Collection Time    02/04/14  9:50 AM      Result Value Ref Range Status   Specimen Description BLOOD RIGHT HAND   Final   Special Requests BOTTLES DRAWN AEROBIC AND ANAEROBIC 5CC   Final   Culture  Setup Time     Final   Value: 02/04/2014 14:53     Performed at [facility]   Culture     Final   Value: NO  GROWTH 5 DAYS     Performed at [facility]   Report Status 02/10/2014 FINAL   Final    Medical History: Past Medical History  Diagnosis Date  . Type II diabetes mellitus ~ 2009    "take Metformin" (08/24/2012)  . Bipolar 1 disorder   . Cellulitis 08/24/2012    "RLE and spot on my right forearm" (08/24/2012)    Medications:  Scheduled:  . asenapine  5 mg Sublingual BID  . divalproex  2,000 mg Oral QHS  . enoxaparin (LOVENOX) injection  60 mg Subcutaneous Daily  . FLUoxetine  40 mg Oral Daily  . insulin aspart  0-5 Units Subcutaneous QHS  . insulin aspart  0-9 Units Subcutaneous TID WC  . pantoprazole  40 mg Oral Daily  . risperidone  4 mg Oral QHS  . vancomycin  1,250 mg Intravenous Q12H   Infusions:  . sodium chloride 100 mL/hr at 02/18/14 0229   PRN: acetaminophen, acetaminophen, alum & mag hydroxide-simeth, HYDROmorphone (DILAUDID) injection, ondansetron (ZOFRAN) IV, ondansetron, oxyCODONE Assessment: 39 yo M with Hx DM who presents to [facility] ED with complaints of worsening redness and swelling of his R foot.  He has had ulcers on his R foot for 3-4 weeks and was hospitalized at [facility]  and was on Vancomycin and Cefepime from 5/24-5/28.  He was discharged 5/28 and was to take doxycycline and augmentin until 6/3 for a total of a 10 day course of abx.  Note Patient had the doxycycline filled but not the Augmentin per the patient.  Starting vancomycin and zosyn per pharmacy.   At [facility] 5/24 >> Vancomycin >> 5/28 5/24 >> Cefepime >> 5/28 5/28 >> Doxycycline >> 6/3 Readmission at [facility] on 6/7 6/6 >> Vancomycin >>  6/7 >> Zosyn >>    Labs Renal: Scr is WNL, estimated CrCl > 100 ml/min  Temp: 102 on admission, AF today  WBC: WNL Weight = Obese, 129 kg  Micro 6/6 Blood x 2: Sent  Goal of Therapy:  Vancomycin trough level 15-20 mcg/ml Zosyn per renal function   Plan:  1.) Patient loaded with vancomycin 2500 mg IV x 1, then started 1250 mg IV Q12h 2.) Start Zosyn 3.375  grams IV q8h, infuse over 4 hours - This will provide Pseudomonas coverage + anaerobic coverage for wound 3.) Monitor renal function, Check Vancomycin trough at Css, f/u cultures 4.) Note, I think patient would tolerate Augmentin as an outpatient if he takes it with food - given that the allergy listed is just N/V.   Could also try clinda/flagyl.   Gaye Alken Petro Talent PharmD Pager #: [phone] 10:43 AM 02/18/2014

## 2014-02-18 NOTE — ED Provider Notes (Signed)
Medical screening examination/treatment/procedure(s) were conducted as a shared visit with non-physician practitioner(s) and myself.  I personally evaluated the patient during the encounter.   EKG Interpretation   Date/Time:  Saturday February 17 2014 21:35:44 EDT Ventricular Rate:  87 PR Interval:  143 QRS Duration: 100 QT Interval:  381 QTC Calculation: 458 R Axis:   69 Text Interpretation:  Sinus rhythm Probable left ventricular hypertrophy  Anterior ST elevation, probably due to LVH compared to prior, nonspecific  ST changes similar Confirmed by [facility]  MD, TREY ([id number]) on 02/18/2014  12:10:22 AM      38 year old male presenting with recurrent redness, swelling, and pain of his right lower extremity. Recently hospitalized for right lower extremity cellulitis. On exam, nontoxic, not distressed, normal respiratory effort, normal perfusion, right lower extremity is edematous, erythematous, no crepitus, ulcerations of his left medial heel and dorsal surface of middle digits.  Plain films of his right lower extremity did not reveal any subcutaneous. He's had a recent CT scan which demonstrated uncomplicated cellulitis. Plan IV Vanco and admission.  Clinical Impression: 1. Cellulitis of right lower leg       Houston Siren III, MD 02/18/14 [phone]

## 2014-02-18 NOTE — H&P (Signed)
[facility] History and Physical  NAASON CUFFEE [id number] DOB: 1976/01/18 DOA: 02/17/2014  Referring physician:  PCP: Pcp Not In System  Specialists:   Chief Complaint:   HPI: James [name] is a 38 y.o. male with a hx of DM2 who presents to the ED with complaints of worsening redness and swelling of his right Foot.  He has ulcers on his right foot for 3 to 4 weeks and was last hospitalized on 05/28 for cellulitis.   He reports having fevers and chills today.     Review of Systems:  Constitutional: No Weight Loss, No Weight Gain, Night Sweats, +Fevers, +Chills, Fatigue, or Generalized Weakness HEENT: No Headaches, Difficulty Swallowing,Tooth/Dental Problems,Sore Throat,  No Sneezing, Rhinitis, Ear Ache, Nasal Congestion, or Post Nasal Drip,  Cardio-vascular:  No Chest pain, Orthopnea, PND, Edema in lower extremities, Anasarca, Dizziness, Palpitations  Resp: No Dyspnea, No DOE, No Productive Cough, No Non-Productive Cough, No Hemoptysis, No Change in Color of Mucus,  No Wheezing.    GI: No Heartburn, Indigestion, Abdominal Pain, Nausea, Vomiting, Diarrhea, Change in Bowel Habits,  Loss of Appetite  GU: No Dysuria, Change in Color of Urine, No Urgency or Frequency.  No flank pain.  Musculoskeletal: No Joint Pain or Swelling.  No Decreased Range of Motion. No Back Pain.  Neurologic: No Syncope, No Seizures, Muscle Weakness, Paresthesia, Vision Disturbance or Loss, No Diplopia, No Vertigo, No Difficulty Walking,  Skin: No Rash , + Ulcers Right Foot.   Psych: No Change in Mood or Affect. No Depression or Anxiety. No Memory loss. No Confusion or Hallucinations   Past Medical History  Diagnosis Date  . Type II diabetes mellitus ~ 2009    "take Metformin" (08/24/2012)  . Bipolar 1 disorder   . Cellulitis 08/24/2012    "RLE and spot on my right forearm" (08/24/2012)    Past Surgical History  Procedure Laterality Date  . Open anterior shoulder reconstruction  ~ 2003    "right; kept dislocating; cut it open; sewed muscle back together; moved cartilage around and stapled that" (08/24/2012)  . Foot fracture surgery      "left; scaffold broke" 08/24/2012)     Prior to Admission medications   Medication Sig Start Date End Date Taking? Authorizing Provider  asenapine (SAPHRIS) 5 MG SUBL Place 5 mg under the tongue 2 (two) times daily.   Yes Historical Provider, MD  divalproex (DEPAKOTE ER) 500 MG 24 hr tablet Take 2,000 mg by mouth at bedtime.   Yes Historical Provider, MD  FLUoxetine (PROZAC) 40 MG capsule Take 40 mg by mouth daily.   Yes Historical Provider, MD  omeprazole (PRILOSEC OTC) 20 MG tablet Take 20 mg by mouth daily.   Yes Historical Provider, MD  risperidone (RISPERDAL) 4 MG tablet Take 4 mg by mouth at bedtime.    Yes Historical Provider, MD      Allergies  Allergen Reactions  . Amoxicillin Nausea And Vomiting     Social History:  reports that he has been smoking Cigarettes.  He has a 25 pack-year smoking history. He has never used smokeless tobacco. He reports that he drinks alcohol. He reports that he does not use illicit drugs.     Family History  Problem Relation Age of Onset  . Diabetic kidney disease Maternal Uncle        Physical Exam:  GEN:  Pleasant  Morbidlly Obese 38 y.o. Caucasian  male  examined  and in no acute distress; cooperative with exam Filed Vitals:  02/17/14 2231 02/17/14 2252 02/17/14 2303 02/18/14 0016  BP: 134/77  131/64   Pulse:   77   Temp:  102 F (38.9 C)  100.3 F (37.9 C)  TempSrc:  Rectal  Rectal  Resp: 27  19   Height:      Weight:      SpO2:   96%    Blood pressure 131/64, pulse 77, temperature 100.3 F (37.9 C), temperature source Rectal, resp. rate 19, height 6\' 3"  (1.905 m), weight 129.275 kg (285 lb), SpO2 96.00%. PSYCH: He is alert and oriented x4; does not appear anxious does not appear depressed; affect is normal HEENT: Normocephalic and Atraumatic, Mucous membranes pink; PERRLA;  EOM intact; Fundi:  Benign;  No scleral icterus, Nares: Patent, Oropharynx: Clear, Fair Dentition, Neck:  FROM, no cervical lymphadenopathy nor thyromegaly or carotid bruit; no JVD; Breasts:: Not examined CHEST WALL: No tenderness CHEST: Normal respiration, clear to auscultation bilaterally HEART: Regular rate and rhythm; no murmurs rubs or gallops BACK: No kyphosis or scoliosis; no CVA tenderness ABDOMEN: Positive Bowel Sounds, Obese, soft non-tender; no masses, no organomegaly. Rectal Exam: Not done EXTREMITIES: No cyanosis, clubbing, + EDEMA and Inflammation of Right Foot= 2+;  Genitalia: not examined PULSES: 2+ and symmetric SKIN: Normal hydration no rash,  + Ulcer on Right medial Foot below Malleolus and ulcers of Right 2nd and 3 rd toes.    CNS:  Alert and Oriented X 4,  No Focal Deficits.    Vascular: pulses palpable throughout    Labs on Admission:  Basic Metabolic Panel:  Recent Labs Lab 02/17/14 2206  NA 133*  K 4.1  CL 94*  CO2 25  GLUCOSE 130*  BUN 17  CREATININE 0.67  CALCIUM 9.3   Liver Function Tests: No results found for this basename: AST, ALT, ALKPHOS, BILITOT, PROT, ALBUMIN,  in the last 168 hours No results found for this basename: LIPASE, AMYLASE,  in the last 168 hours No results found for this basename: AMMONIA,  in the last 168 hours CBC:  Recent Labs Lab 02/17/14 2206  WBC 8.9  NEUTROABS 6.7  HGB 13.4  HCT 37.9*  MCV 90.5  PLT 267   Cardiac Enzymes: No results found for this basename: CKTOTAL, CKMB, CKMBINDEX, TROPONINI,  in the last 168 hours  BNP (last 3 results) No results found for this basename: PROBNP,  in the last 8760 hours CBG:  Recent Labs Lab 02/17/14 2126  GLUCAP 156*    Radiological Exams on Admission: Dg Foot Complete Right  02/17/2014   CLINICAL DATA:  Cellulitis.  EXAM: RIGHT FOOT COMPLETE - 3+ VIEW  COMPARISON:  Right foot CT Feb 07, 2014.  FINDINGS: No acute fracture deformity or dislocation. Remote fracture of  the distal aspect of the fifth proximal phalanx with apparent nonunion, unchanged. Diffuse soft tissue swelling without subcutaneous gas or radiopaque foreign bodies. Mild first metatarsophalangeal osteoarthrosis. Type 2 navicular bone.  IMPRESSION: Soft tissue swelling consistent with history of cellulitis, without subcutaneous gas. No radiographic findings of osteomyelitis.   Electronically Signed   By: Elon Alas   On: 02/17/2014 23:12      Assessment/Plan:   38 y.o. male with  Principal Problem:   Cellulitis and abscess of foot Active Problems:   Diabetes mellitus   Bipolar affective disorder   Diabetic ulcer of right foot     1.   Cellulitis Right Foot- IV Vancomycin and IV Cipro and Wound Care.    2.   DM2- SSI  Coverage PRN.     3.   Diabetic Ulcer of Right Foot and Toes- Wound Care.     4.   Bipolar disorder-   Continue Regular Meds.     5.   DVT Prophylaxis with Lovenox.         Code Status:   FULL CODE Family Communication:    No Family Present Disposition Plan:     Inpatient    Time spent:  [facility] [place] [facility] Pager [phone]  If 7PM-7AM, please contact night-coverage [email or web address] Password [email or web address] 02/18/2014, 12:21 AM

## 2014-02-18 NOTE — Progress Notes (Signed)
[facility]                                                                              Patient Demographics  James [name], is a 38 y.o. male, DOB - [birth year], [id number]  Admit date - 02/17/2014   Admitting Physician Theressa Millard, MD  Outpatient Primary MD for the patient is Pcp Not In System  LOS - 1   Chief Complaint  Patient presents with  . Cellulitis    recurrent      HPI: James [name] is a 38 y.o. male with a history of diabetes who presented to the ED with complaints of worsening redness and swelling of his right foot. He has ulcers on his right foot for 3 to 4 weeks and was last hospitalized on 05/28 for cellulitis. He reported having fevers and chills.  Assessment & Plan   Recurrent Cellulitis of the right foot -Patient was recently admitted and discharged in May of 2015 for cellulitis of the same foot, and was discharged with Augmentin and doxycycline -Will continue patient on vancomycin and ciprofloxacin -Will consult wound care -Patient had ABI on 02/07/2014 which was normal -Lower extremity Doppler on 02/04/2014 showed no evidence of DVT or superficial thrombosis in the right lower extremity and left common femoral vein  Diabetes mellitus type 2 -Continue insulin sliding scale as well with CBG monitoring -Hemoglobin A1c on 10/28/2013 was 6.2  Bipolar disorder -Continue saphenous, Depakote, Risperdal  Depression -Continue Prozac  Code Status: Full  Family Communication: None at bedside  Disposition Plan: Admitted  Time Spent in minutes   30 minutes  Procedures  None  Consults   None  DVT Prophylaxis  Lovenox   Lab Results  Component Value Date   PLT 212 02/18/2014    Medications  Scheduled Meds: . asenapine  5 mg Sublingual BID  . ciprofloxacin  400 mg Intravenous Q12H  . divalproex  2,000 mg Oral QHS  . enoxaparin (LOVENOX) injection  60 mg Subcutaneous Daily  . FLUoxetine  40 mg Oral Daily  . insulin aspart   0-5 Units Subcutaneous QHS  . insulin aspart  0-9 Units Subcutaneous TID WC  . pantoprazole  40 mg Oral Daily  . risperidone  4 mg Oral QHS  . vancomycin  1,250 mg Intravenous Q12H   Continuous Infusions: . sodium chloride 100 mL/hr at 02/18/14 0229   PRN Meds:.acetaminophen, acetaminophen, alum & mag hydroxide-simeth, HYDROmorphone (DILAUDID) injection, ondansetron (ZOFRAN) IV, ondansetron, oxyCODONE  Antibiotics    Anti-infectives   Start     Dose/Rate Route Frequency Ordered Stop   02/18/14 1000  vancomycin (VANCOCIN) 1,250 mg in sodium chloride 0.9 % 250 mL IVPB     1,250 mg 166.7 mL/hr over 90 Minutes Intravenous Every 12 hours 02/17/14 2311     02/18/14 0030  ciprofloxacin (CIPRO) IVPB 400 mg     400 mg 200 mL/hr over 60 Minutes Intravenous Every 12 hours 02/18/14 0023     02/17/14 2230  vancomycin (VANCOCIN) 2,500 mg in sodium chloride 0.9 % 500 mL IVPB     2,500 mg 250 mL/hr over 120 Minutes Intravenous  Once 02/17/14 2201 02/18/14 0118  Subjective:   James [name] seen and examined today.  Patient states he still has pain in right foot and that his cellulitis initially improved, however, returned.  He denies shortness of breath, chest pain, or abdominal pain at this time.  Objective:   Filed Vitals:   02/18/14 0115 02/18/14 0130 02/18/14 0159 02/18/14 0541  BP:  109/65 112/68 120/74  Pulse: 76 67 67 62  Temp:   98.2 F (36.8 C) 97.8 F (36.6 C)  TempSrc:   Oral Oral  Resp: 14 17 18 18   Height:   6\' 3"  (1.905 m)   Weight:   129.275 kg (285 lb)   SpO2: 96% 96% 93% 96%    Wt Readings from Last 3 Encounters:  02/18/14 129.275 kg (285 lb)  02/04/14 127.007 kg (280 lb)  10/27/13 127.007 kg (280 lb)     Intake/Output Summary (Last 24 hours) at 02/18/14 0719 Last data filed at 02/18/14 0600  Gross per 24 hour  Intake 1051.67 ml  Output   1225 ml  Net -173.33 ml    Exam  General: Well developed, well nourished, NAD, appears stated  age  HEENT: NCAT, PERRLA, EOMI, Anicteic Sclera, mucous membranes moist.  Neck: Supple, no JVD, no masses  Cardiovascular: S1 S2 auscultated, no rubs, murmurs or gallops. Regular rate and rhythm.  Respiratory: Clear to auscultation bilaterally with equal chest rise  Abdomen: Soft, obese, nontender, nondistended, + bowel sounds  Extremities: RLE edema and erythema with ulcer on the right medial mallelous, 2nd and 3rd toes.  LLE no clubbing, cyanosis, or edema  Neuro: AAOx3, cranial nerves grossly intact. Strength 5/5 in patient's upper and lower extremities bilaterally  Psych: Normal affect and demeanor with intact judgement and insight  Data Review   Micro Results No results found for this or any previous visit (from the past 240 hour(s)).  Radiology Reports Dg Ankle Complete Right  02/04/2014   CLINICAL DATA:  Right ankle swelling and pain.  EXAM: RIGHT ANKLE - COMPLETE 3+ VIEW  COMPARISON:  08/24/2012  FINDINGS: Soft tissue swelling is present.  There is no evidence of fracture, subluxation or dislocation.  No radiographic evidence of osteomyelitis is noted.  No focal bony lesions are present.  IMPRESSION: Soft tissue swelling without acute bony abnormality.   Electronically Signed   By: Hassan Rowan M.D.   On: 02/04/2014 09:23   Ct Foot Right W Contrast  02/07/2014   CLINICAL DATA:  Cellulitis from the right foot extending to the right calf. Swelling and redness. Diabetes.  EXAM: CT OF THE RIGHT FOOT WITH CONTRAST; CT OF THE RIGHT TIBIA AND FIBULA WITH CONTRAST  TECHNIQUE: Multidetector CT imaging was performed following the standard protocol during bolus administration of intravenous contrast.  CONTRAST:  51mL OMNIPAQUE IOHEXOL 300 MG/ML  SOLN  COMPARISON:  None.  FINDINGS: There is subcutaneous edema extending from the foot proximally to the level of the knee. There is no soft tissue abscess. The anterior and posterior muscular compartments are within normal limits. Negative for  osteomyelitis. Tibia and fibula appear normal. No ankle effusion is present. Accessory ossicles are present in the foot, incidentally noted including at the anterior process of the calcaneus. No ulcerations are identified. Calcaneal spurs are present. There is no radiopaque foreign body.  On the scout images, the contralateral leg is partially visualized and shows hardware compatible with an old ORIF. Neurovascular bundles appear within normal limits.  IMPRESSION: Cellulitis of the right foot and leg. No soft tissue abscess and  no osteomyelitis.   Electronically Signed   By: Dereck Ligas M.D.   On: 02/07/2014 12:27   Ct Tibia Fibula Right W Contrast  02/07/2014   CLINICAL DATA:  Cellulitis from the right foot extending to the right calf. Swelling and redness. Diabetes.  EXAM: CT OF THE RIGHT FOOT WITH CONTRAST; CT OF THE RIGHT TIBIA AND FIBULA WITH CONTRAST  TECHNIQUE: Multidetector CT imaging was performed following the standard protocol during bolus administration of intravenous contrast.  CONTRAST:  39mL OMNIPAQUE IOHEXOL 300 MG/ML  SOLN  COMPARISON:  None.  FINDINGS: There is subcutaneous edema extending from the foot proximally to the level of the knee. There is no soft tissue abscess. The anterior and posterior muscular compartments are within normal limits. Negative for osteomyelitis. Tibia and fibula appear normal. No ankle effusion is present. Accessory ossicles are present in the foot, incidentally noted including at the anterior process of the calcaneus. No ulcerations are identified. Calcaneal spurs are present. There is no radiopaque foreign body.  On the scout images, the contralateral leg is partially visualized and shows hardware compatible with an old ORIF. Neurovascular bundles appear within normal limits.  IMPRESSION: Cellulitis of the right foot and leg. No soft tissue abscess and no osteomyelitis.   Electronically Signed   By: Dereck Ligas M.D.   On: 02/07/2014 12:27   Dg Foot  Complete Right  02/17/2014   CLINICAL DATA:  Cellulitis.  EXAM: RIGHT FOOT COMPLETE - 3+ VIEW  COMPARISON:  Right foot CT Feb 07, 2014.  FINDINGS: No acute fracture deformity or dislocation. Remote fracture of the distal aspect of the fifth proximal phalanx with apparent nonunion, unchanged. Diffuse soft tissue swelling without subcutaneous gas or radiopaque foreign bodies. Mild first metatarsophalangeal osteoarthrosis. Type 2 navicular bone.  IMPRESSION: Soft tissue swelling consistent with history of cellulitis, without subcutaneous gas. No radiographic findings of osteomyelitis.   Electronically Signed   By: Elon Alas   On: 02/17/2014 23:12    CBC  Recent Labs Lab 02/17/14 2206 02/18/14 0530  WBC 8.9 6.9  HGB 13.4 12.0*  HCT 37.9* 33.9*  PLT 267 212  MCV 90.5 90.9  MCH 32.0 32.2  MCHC 35.4 35.4  RDW 12.7 12.8  LYMPHSABS 1.4  --   MONOABS 0.8  --   EOSABS 0.0  --   BASOSABS 0.0  --     Chemistries   Recent Labs Lab 02/17/14 2206 02/18/14 0530  NA 133* 136*  K 4.1 3.8  CL 94* 96  CO2 25 26  GLUCOSE 130* 118*  BUN 17 13  CREATININE 0.67 0.74  CALCIUM 9.3 8.7   ------------------------------------------------------------------------------------------------------------------ estimated creatinine clearance is 177.8 ml/min (by C-G formula based on Cr of 0.74). ------------------------------------------------------------------------------------------------------------------ No results found for this basename: HGBA1C,  in the last 72 hours ------------------------------------------------------------------------------------------------------------------ No results found for this basename: CHOL, HDL, LDLCALC, TRIG, CHOLHDL, LDLDIRECT,  in the last 72 hours ------------------------------------------------------------------------------------------------------------------ No results found for this basename: TSH, T4TOTAL, FREET3, T3FREE, THYROIDAB,  in the last 72  hours ------------------------------------------------------------------------------------------------------------------ No results found for this basename: VITAMINB12, FOLATE, FERRITIN, TIBC, IRON, RETICCTPCT,  in the last 72 hours  Coagulation profile No results found for this basename: INR, PROTIME,  in the last 168 hours  No results found for this basename: DDIMER,  in the last 72 hours  Cardiac Enzymes No results found for this basename: CK, CKMB, TROPONINI, MYOGLOBIN,  in the last 168 hours ------------------------------------------------------------------------------------------------------------------ No components found with this basename: POCBNP,  Lyna Laningham D.O. on 02/18/2014 at 7:19 AM  Between 7am to 7pm - Pager - [phone]  After 7pm go to [email or web address] - password [email or web address]  And look for the night coverage person covering for me after hours  [facility] Office  [phone]

## 2014-02-19 ENCOUNTER — Inpatient Hospital Stay (HOSPITAL_COMMUNITY): Payer: Medicare Other

## 2014-02-19 DIAGNOSIS — M79609 Pain in unspecified limb: Secondary | ICD-10-CM

## 2014-02-19 DIAGNOSIS — E119 Type 2 diabetes mellitus without complications: Secondary | ICD-10-CM | POA: Diagnosis not present

## 2014-02-19 DIAGNOSIS — M7989 Other specified soft tissue disorders: Secondary | ICD-10-CM

## 2014-02-19 DIAGNOSIS — L02419 Cutaneous abscess of limb, unspecified: Secondary | ICD-10-CM | POA: Diagnosis not present

## 2014-02-19 DIAGNOSIS — M7981 Nontraumatic hematoma of soft tissue: Secondary | ICD-10-CM

## 2014-02-19 DIAGNOSIS — F319 Bipolar disorder, unspecified: Secondary | ICD-10-CM | POA: Diagnosis not present

## 2014-02-19 DIAGNOSIS — I517 Cardiomegaly: Secondary | ICD-10-CM

## 2014-02-19 DIAGNOSIS — L02619 Cutaneous abscess of unspecified foot: Secondary | ICD-10-CM | POA: Diagnosis not present

## 2014-02-19 LAB — GLUCOSE, CAPILLARY
Glucose-Capillary: 115 mg/dL — ABNORMAL HIGH (ref 70–99)
Glucose-Capillary: 138 mg/dL — ABNORMAL HIGH (ref 70–99)
Glucose-Capillary: 124 mg/dL — ABNORMAL HIGH (ref 70–99)
Glucose-Capillary: 131 mg/dL — ABNORMAL HIGH (ref 70–99)

## 2014-02-19 LAB — VANCOMYCIN, TROUGH: Vancomycin Tr: <5 ug/mL — ABNORMAL LOW (ref 10.0–20.0)

## 2014-02-19 MED ORDER — COLLAGENASE 250 UNIT/GM EX OINT
TOPICAL_OINTMENT | Freq: Every day | CUTANEOUS | Status: DC
  Administered 2014-02-19 – 2014-02-22 (×4): via TOPICAL
  Filled 2014-02-19: qty 30

## 2014-02-19 MED ORDER — VANCOMYCIN HCL 10 G IV SOLR
1500.0000 mg | Freq: Once | INTRAVENOUS | Status: AC
  Administered 2014-02-19: 1500 mg via INTRAVENOUS
  Filled 2014-02-19: qty 1500

## 2014-02-19 MED ORDER — GADOBENATE DIMEGLUMINE 529 MG/ML IV SOLN
20.0000 mL | Freq: Once | INTRAVENOUS | Status: AC | PRN
  Administered 2014-02-19: 20 mL via INTRAVENOUS

## 2014-02-19 MED ORDER — VANCOMYCIN HCL 10 G IV SOLR
1250.0000 mg | Freq: Three times a day (TID) | INTRAVENOUS | Status: DC
  Administered 2014-02-20: 1250 mg via INTRAVENOUS
  Filled 2014-02-19 (×2): qty 1250

## 2014-02-19 NOTE — Progress Notes (Signed)
ANTIBIOTIC CONSULT NOTE - Follow Up  Pharmacy Consult for Vancomycin/Zosyn Indication: Cellulitis/wound in diabetic foot   Allergies  Allergen Reactions  . Amoxicillin Nausea And Vomiting    Patient Measurements: Height: 6\' 3"  (190.5 cm) Weight: 285 lb (129.275 kg) (stated ) IBW/kg (Calculated) : 84.5   Vital Signs: Temp: 97.7 F (36.5 C) (06/08 1038) Temp src: Oral (06/08 0908) BP: 123/71 mmHg (06/08 1038) Pulse Rate: 59 (06/08 1038) Intake/Output from previous day: 06/07 0701 - 06/08 0700 In: 3110 [P.O.:360; I.V.:2400; IV Piggyback:350] Out: [id number] [Urine:4125] Intake/Output from this shift: Total I/O In: 240 [P.O.:240] Out: 950 [Urine:950]  Labs:  Recent Labs  02/17/14 2206 02/18/14 0530  WBC 8.9 6.9  HGB 13.4 12.0*  PLT 267 212  CREATININE 0.67 0.74   Estimated Creatinine Clearance: 177.8 ml/min (by C-G formula based on Cr of 0.74). No results found for this basename: VANCOTROUGH, VANCOPEAK, VANCORANDOM, [place], [place], [place], [place], [place], TOBRARND, AMIKACINPEAK, AMIKACINTROU, AMIKACIN,  in the last 72 hours   Microbiology: Recent Results (from the past 720 hour(s))  CULTURE, BLOOD (ROUTINE X 2)     Status: None   Collection Time    02/04/14  9:50 AM      Result Value Ref Range Status   Specimen Description BLOOD RIGHT ARM   Final   Special Requests BOTTLES DRAWN AEROBIC AND ANAEROBIC [place], [place]   Final   Culture  Setup Time     Final   Value: 02/04/2014 14:53     Performed at [facility]   Culture     Final   Value: NO GROWTH 5 DAYS     Performed at [facility]   Report Status 02/10/2014 FINAL   Final  CULTURE, BLOOD (ROUTINE X 2)     Status: None   Collection Time    02/04/14  9:50 AM      Result Value Ref Range Status   Specimen Description BLOOD RIGHT HAND   Final   Special Requests BOTTLES DRAWN AEROBIC AND ANAEROBIC 5CC   Final   Culture  Setup Time     Final   Value: 02/04/2014 14:53     Performed  at [facility]   Culture     Final   Value: NO GROWTH 5 DAYS     Performed at [facility]   Report Status 02/10/2014 FINAL   Final  CULTURE, BLOOD (ROUTINE X 2)     Status: None   Collection Time    02/17/14 10:07 PM      Result Value Ref Range Status   Specimen Description BLOOD RIGHT HAND   Final   Special Requests BOTTLES DRAWN AEROBIC AND ANAEROBIC 5CC   Final   Culture  Setup Time     Final   Value: 02/18/2014 01:04     Performed at [facility]   Culture     Final   Value:        BLOOD CULTURE RECEIVED NO GROWTH TO DATE CULTURE WILL BE HELD FOR 5 DAYS BEFORE ISSUING A FINAL NEGATIVE REPORT     Performed at [facility]   Report Status PENDING   Incomplete  CULTURE, BLOOD (ROUTINE X 2)     Status: None   Collection Time    02/17/14 10:34 PM      Result Value Ref Range Status   Specimen Description BLOOD LEFT ARM   Final   Special Requests BOTTLES DRAWN AEROBIC AND ANAEROBIC 5CC   Final  Culture  Setup Time     Final   Value: 02/18/2014 01:04     Performed at [facility]   Culture     Final   Value:        BLOOD CULTURE RECEIVED NO GROWTH TO DATE CULTURE WILL BE HELD FOR 5 DAYS BEFORE ISSUING A FINAL NEGATIVE REPORT     Performed at [facility]   Report Status PENDING   Incomplete    Medical History: Past Medical History  Diagnosis Date  . Type II diabetes mellitus ~ 2009    "take Metformin" (08/24/2012)  . Bipolar 1 disorder   . Cellulitis 08/24/2012    "RLE and spot on my right forearm" (08/24/2012)    Medications:  Scheduled:  . asenapine  5 mg Sublingual BID  . collagenase   Topical Daily  . divalproex  2,000 mg Oral QHS  . enoxaparin (LOVENOX) injection  60 mg Subcutaneous Daily  . FLUoxetine  40 mg Oral Daily  . insulin aspart  0-5 Units Subcutaneous QHS  . insulin aspart  0-9 Units Subcutaneous TID WC  . pantoprazole  40 mg Oral Daily  . piperacillin-tazobactam (ZOSYN)  IV  3.375 g Intravenous Q8H  .  risperidone  4 mg Oral QHS  . vancomycin  1,250 mg Intravenous Q12H   Infusions:  . sodium chloride 100 mL/hr at 02/19/14 1210   PRN: acetaminophen, acetaminophen, alum & mag hydroxide-simeth, HYDROmorphone (DILAUDID) injection, ondansetron (ZOFRAN) IV, ondansetron, oxyCODONE Assessment: 38 yo M with Hx DM who presents to [facility] ED with complaints of worsening redness and swelling of his R foot.  He has had ulcers on his R foot for 3-4 weeks and was hospitalized at [facility] and was on Vancomycin and Cefepime from 5/24-5/28.  He was discharged 5/28 and was to take doxycycline and augmentin until 6/3 for a total of a 10 day course of abx.  Note Patient had the doxycycline filled but not the Augmentin per the patient.  Starting vancomycin and zosyn per pharmacy.   At [facility] 5/24 >> Vancomycin >> 5/28 5/24 >> Cefepime >> 5/28 5/28 >> Doxycycline >> 6/3 Readmission at [facility] on 6/7 6/6 >> Vancomycin >>  6/7 >> Zosyn >>    Temp: 102 PM 6/6, afebrile since WBC: WNL Renal: SCr stable, CrCl > 100 Weight = Obese, 129 kg  Micro 6/6 Blood x 2: NGTD  Goal of Therapy:  Vancomycin trough level 15-20 mcg/ml Zosyn per renal function   Plan:  1.) Continue vancomycin 1250 mg IV Q12h 2.) Continue Zosyn 3.375 grams IV q8h, infuse over 4 hours -  3.) Check vancomycin trough prior to next dose tonight which will be 5th dose 4.) Note, I think patient would tolerate Augmentin as an outpatient if he takes it with food - given that the allergy listed is just N/V.   Could also try clinda/flagyl.   Adrian Saran, PharmD, BCPS Pager [phone] 02/19/2014 12:35 PM

## 2014-02-19 NOTE — Progress Notes (Signed)
Echocardiogram 2D Echocardiogram has been performed.  James [name] 02/19/2014, 3:53 PM

## 2014-02-19 NOTE — Progress Notes (Signed)
Rx Brief Antibiotic note:  IV Vancomycin  Assessement:  VT=<5 mg/L after 2500 load and 1250 q12h @ Css  Scr stable (0.67>0.74)  Below goal of 15-20 mg/L  Plan:  1500mg  Vancomycin x1 then change to 1250mg  IV q8h  F/u SCr/levels/cultures   Dorrene German 02/19/2014 11:30 PM

## 2014-02-19 NOTE — Consult Note (Signed)
WOC wound consult note Reason for Consult: cellulitis of right medial malleolus and right second, third and fourth toes.  Wound type:Cellulitis right foot.  Measurement: Right medial malleolus 4 cm x 4.5 cm, unable to visualize wound bed due to thin layer of adherent slough.  Each toe measures 1 cm x 0.5 cm and wound bed is scabbed.  Patient indicated he hit the toes on a door prior to admission.  Wound bed:100% devitalized tissue.  Drainage (amount, consistency, odor) Minimal, serosanguinous drainage.  No odor.  Periwound: Right foot erythematous and tender.  Dressing procedure/placement/frequency: Cleanse ulcers to right malleolus, and toes with NS and pat gently dry.  Apply Santyl ointment to wound bed, 1/8 inch thickness (opaque).  Top with NS moist dressing and cover with 4x4, kerlix and tape.  Change daily.  Will not follow at this time.  Please re-consult if needed.  Domenic Moras RN BSN [place] Pager [phone]

## 2014-02-19 NOTE — Progress Notes (Signed)
[facility]                                                                              Patient Demographics  James [name], is a 38 y.o. male, DOB - Nov 14, 1975, [id number]  Admit date - 02/17/2014   Admitting Physician Theressa Millard, MD  Outpatient Primary MD for the patient is Pcp Not In System  LOS - 2   Chief Complaint  Patient presents with  . Cellulitis    recurrent      HPI: GABRAEL MARES is a 38 y.o. male with a history of diabetes who presented to the ED with complaints of worsening redness and swelling of his right foot. He has ulcers on his right foot for 3 to 4 weeks and was last hospitalized on 05/28 for cellulitis. He reported having fevers and chills.   Assessment & Plan   Recurrent Cellulitis of the right foot -Patient was recently admitted and discharged in May of 2015 for cellulitis of the same foot, and was discharged with Augmentin and doxycycline (however, he never obtained the augmentin as he has an allergy). -Will continue patient on vancomycin and zosyn -Wound care consulted and appreciated -Patient had ABI on 02/07/2014 which was normal -Lower extremity Doppler on 02/04/2014 showed no evidence of DVT or superficial thrombosis in the right lower extremity and left common femoral vein -Will obtain repeat Right lower doppler as patient continues to have persistent RLE edema -Infectious disease consulted and appreciated.  Diabetes mellitus type 2 -Continue insulin sliding scale as well with CBG monitoring -Hemoglobin A1c on 10/28/2013 was 6.2  Bipolar disorder -Continue saphenous, Depakote, Risperdal  Depression -Continue Prozac  Code Status: Full  Family Communication: None at bedside  Disposition Plan: Admitted  Time Spent in minutes   30 minutes  Procedures  None  Consults   Infectious disease Wound Care  DVT Prophylaxis  Lovenox   Lab Results  Component Value Date   PLT 212 02/18/2014     Medications  Scheduled Meds: . asenapine  5 mg Sublingual BID  . divalproex  2,000 mg Oral QHS  . enoxaparin (LOVENOX) injection  60 mg Subcutaneous Daily  . FLUoxetine  40 mg Oral Daily  . insulin aspart  0-5 Units Subcutaneous QHS  . insulin aspart  0-9 Units Subcutaneous TID WC  . pantoprazole  40 mg Oral Daily  . piperacillin-tazobactam (ZOSYN)  IV  3.375 g Intravenous Q8H  . risperidone  4 mg Oral QHS  . vancomycin  1,250 mg Intravenous Q12H   Continuous Infusions: . sodium chloride 100 mL/hr at 02/19/14 0049   PRN Meds:.acetaminophen, acetaminophen, alum & mag hydroxide-simeth, HYDROmorphone (DILAUDID) injection, ondansetron (ZOFRAN) IV, ondansetron, oxyCODONE  Antibiotics    Anti-infectives   Start     Dose/Rate Route Frequency Ordered Stop   02/18/14 1200  piperacillin-tazobactam (ZOSYN) IVPB 3.375 g     3.375 g 12.5 mL/hr over 240 Minutes Intravenous Every 8 hours 02/18/14 1045     02/18/14 1000  vancomycin (VANCOCIN) 1,250 mg in sodium chloride 0.9 % 250 mL IVPB     1,250 mg 166.7 mL/hr over 90 Minutes Intravenous Every 12 hours 02/17/14 2311  02/18/14 0030  ciprofloxacin (CIPRO) IVPB 400 mg  Status:  Discontinued     400 mg 200 mL/hr over 60 Minutes Intravenous Every 12 hours 02/18/14 0023 02/18/14 1040   02/17/14 2230  vancomycin (VANCOCIN) 2,500 mg in sodium chloride 0.9 % 500 mL IVPB     2,500 mg 250 mL/hr over 120 Minutes Intravenous  Once 02/17/14 2201 02/18/14 0118        Subjective:   [name] seen and examined today.  Patient states he still has pain in right foot and that his cellulitis initially improved, however, returned.  He did not fill the Augmentin as he has an allergy to the medication.  He denies shortness of breath, chest pain, or abdominal pain at this time.  Objective:   Filed Vitals:   02/18/14 1000 02/18/14 1400 02/18/14 2120 02/19/14 0622  BP: 121/73 129/71 125/72 108/59  Pulse: 66 73 59 82  Temp: 97.8 F (36.6 C)  98.7 F (37.1 C) 98.5 F (36.9 C) 98.2 F (36.8 C)  TempSrc: Oral Oral Oral Oral  Resp: 18 18 18 18   Height:      Weight:      SpO2: 93% 93% 95% 92%    Wt Readings from Last 3 Encounters:  02/18/14 129.275 kg (285 lb)  02/04/14 127.007 kg (280 lb)  10/27/13 127.007 kg (280 lb)     Intake/Output Summary (Last 24 hours) at 02/19/14 0734 Last data filed at 02/19/14 [id number]  Gross per 24 hour  Intake   3110 ml  Output   4125 ml  Net  -1015 ml    Exam  General: Well developed, well nourished, NAD, appears stated age  26: NCAT, mucous membranes moist.  Neck: Supple, no JVD, no masses  Cardiovascular: S1 S2 auscultated, no rubs, murmurs or gallops. Regular rate and rhythm.  Respiratory: Clear to auscultation bilaterally with equal chest rise  Abdomen: Soft, obese, nontender, nondistended, + bowel sounds  Extremities: RLE edema and erythema with ulcer on the right medial mallelous, 2nd and 3rd toes.  LLE no clubbing, cyanosis, or edema  Neuro: AAOx3, no focal deficits  Psych: Normal affect and demeanor with intact judgement and insight  Data Review   Micro Results No results found for this or any previous visit (from the past 240 hour(s)).  Radiology Reports Dg Ankle Complete Right  02/04/2014   CLINICAL DATA:  Right ankle swelling and pain.  EXAM: RIGHT ANKLE - COMPLETE 3+ VIEW  COMPARISON:  08/24/2012  FINDINGS: Soft tissue swelling is present.  There is no evidence of fracture, subluxation or dislocation.  No radiographic evidence of osteomyelitis is noted.  No focal bony lesions are present.  IMPRESSION: Soft tissue swelling without acute bony abnormality.   Electronically Signed   By: Hassan Rowan M.D.   On: 02/04/2014 09:23   Ct Foot Right W Contrast  02/07/2014   CLINICAL DATA:  Cellulitis from the right foot extending to the right calf. Swelling and redness. Diabetes.  EXAM: CT OF THE RIGHT FOOT WITH CONTRAST; CT OF THE RIGHT TIBIA AND FIBULA WITH CONTRAST   TECHNIQUE: Multidetector CT imaging was performed following the standard protocol during bolus administration of intravenous contrast.  CONTRAST:  55mL OMNIPAQUE IOHEXOL 300 MG/ML  SOLN  COMPARISON:  None.  FINDINGS: There is subcutaneous edema extending from the foot proximally to the level of the knee. There is no soft tissue abscess. The anterior and posterior muscular compartments are within normal limits. Negative for osteomyelitis. Tibia and fibula  appear normal. No ankle effusion is present. Accessory ossicles are present in the foot, incidentally noted including at the anterior process of the calcaneus. No ulcerations are identified. Calcaneal spurs are present. There is no radiopaque foreign body.  On the scout images, the contralateral leg is partially visualized and shows hardware compatible with an old ORIF. Neurovascular bundles appear within normal limits.  IMPRESSION: Cellulitis of the right foot and leg. No soft tissue abscess and no osteomyelitis.   Electronically Signed   By: Dereck Ligas M.D.   On: 02/07/2014 12:27   Ct Tibia Fibula Right W Contrast  02/07/2014   CLINICAL DATA:  Cellulitis from the right foot extending to the right calf. Swelling and redness. Diabetes.  EXAM: CT OF THE RIGHT FOOT WITH CONTRAST; CT OF THE RIGHT TIBIA AND FIBULA WITH CONTRAST  TECHNIQUE: Multidetector CT imaging was performed following the standard protocol during bolus administration of intravenous contrast.  CONTRAST:  57mL OMNIPAQUE IOHEXOL 300 MG/ML  SOLN  COMPARISON:  None.  FINDINGS: There is subcutaneous edema extending from the foot proximally to the level of the knee. There is no soft tissue abscess. The anterior and posterior muscular compartments are within normal limits. Negative for osteomyelitis. Tibia and fibula appear normal. No ankle effusion is present. Accessory ossicles are present in the foot, incidentally noted including at the anterior process of the calcaneus. No ulcerations are  identified. Calcaneal spurs are present. There is no radiopaque foreign body.  On the scout images, the contralateral leg is partially visualized and shows hardware compatible with an old ORIF. Neurovascular bundles appear within normal limits.  IMPRESSION: Cellulitis of the right foot and leg. No soft tissue abscess and no osteomyelitis.   Electronically Signed   By: Dereck Ligas M.D.   On: 02/07/2014 12:27   Dg Foot Complete Right  02/17/2014   CLINICAL DATA:  Cellulitis.  EXAM: RIGHT FOOT COMPLETE - 3+ VIEW  COMPARISON:  Right foot CT Feb 07, 2014.  FINDINGS: No acute fracture deformity or dislocation. Remote fracture of the distal aspect of the fifth proximal phalanx with apparent nonunion, unchanged. Diffuse soft tissue swelling without subcutaneous gas or radiopaque foreign bodies. Mild first metatarsophalangeal osteoarthrosis. Type 2 navicular bone.  IMPRESSION: Soft tissue swelling consistent with history of cellulitis, without subcutaneous gas. No radiographic findings of osteomyelitis.   Electronically Signed   By: Elon Alas   On: 02/17/2014 23:12    CBC  Recent Labs Lab 02/17/14 2206 02/18/14 0530  WBC 8.9 6.9  HGB 13.4 12.0*  HCT 37.9* 33.9*  PLT 267 212  MCV 90.5 90.9  MCH 32.0 32.2  MCHC 35.4 35.4  RDW 12.7 12.8  LYMPHSABS 1.4  --   MONOABS 0.8  --   EOSABS 0.0  --   BASOSABS 0.0  --     Chemistries   Recent Labs Lab 02/17/14 2206 02/18/14 0530  NA 133* 136*  K 4.1 3.8  CL 94* 96  CO2 25 26  GLUCOSE 130* 118*  BUN 17 13  CREATININE 0.67 0.74  CALCIUM 9.3 8.7   ------------------------------------------------------------------------------------------------------------------ estimated creatinine clearance is 177.8 ml/min (by C-G formula based on Cr of 0.74). ------------------------------------------------------------------------------------------------------------------ No results found for this basename: HGBA1C,  in the last 72  hours ------------------------------------------------------------------------------------------------------------------ No results found for this basename: CHOL, HDL, LDLCALC, TRIG, CHOLHDL, LDLDIRECT,  in the last 72 hours ------------------------------------------------------------------------------------------------------------------ No results found for this basename: TSH, T4TOTAL, FREET3, T3FREE, THYROIDAB,  in the last 72 hours ------------------------------------------------------------------------------------------------------------------ No results found  for this basename: VITAMINB12, FOLATE, FERRITIN, TIBC, IRON, RETICCTPCT,  in the last 72 hours  Coagulation profile No results found for this basename: INR, PROTIME,  in the last 168 hours  No results found for this basename: DDIMER,  in the last 72 hours  Cardiac Enzymes No results found for this basename: CK, CKMB, TROPONINI, MYOGLOBIN,  in the last 168 hours ------------------------------------------------------------------------------------------------------------------ No components found with this basename: POCBNP,     Markie Frith D.O. on 02/19/2014 at 7:34 AM  Between 7am to 7pm - Pager - [phone]  After 7pm go to [email or web address] - password [email or web address]  And look for the night coverage person covering for me after hours  [facility] Office  [phone]

## 2014-02-19 NOTE — Consult Note (Signed)
[facility]    Date of Admission:  02/17/2014  Date of Consult:  02/19/2014  Reason for Consult: recurrent cellulitis Referring Physician: Dr. Ree Kida   HPI: James [name] is an 38 y.o. male with DM, well controlled, apparently admitted for RLE cellulitis in December of 2013, then cellulitis in February of 2014, again in May of 2015 treated with vancomycin, zosyn as an inpatient, then sent home with rx for doxy and augmentin (but did not fill the augmentin due to his perception that he was allergic to this med when in reality it causes N, V for him). He returned with worsening edema in his right calf, swelling to substantially greater diameter than the left. He has had areas that appear hemorrhagic on his toes with this episode in May of "cellulits" as well as an area of desquamation and excoriation on heel.  He has been started back on vancomycin and zosyn.   Past Medical History  Diagnosis Date  . Type II diabetes mellitus ~ 2009    "take Metformin" (08/24/2012)  . Bipolar 1 disorder   . Cellulitis 08/24/2012    "RLE and spot on my right forearm" (08/24/2012)    Past Surgical History  Procedure Laterality Date  . Open anterior shoulder reconstruction  ~ 2003    "right; kept dislocating; cut it open; sewed muscle back together; moved cartilage around and stapled that" (08/24/2012)  . Foot fracture surgery      "left; scaffold broke" 08/24/2012)  . Eye surgery    ergies:   Allergies  Allergen Reactions  . Amoxicillin Nausea And Vomiting     Medications: I have reviewed patients current medications as documented in Epic Anti-infectives   Start     Dose/Rate Route Frequency Ordered Stop   02/18/14 1200  piperacillin-tazobactam (ZOSYN) IVPB 3.375 g     3.375 g 12.5 mL/hr over 240 Minutes Intravenous Every 8 hours 02/18/14 1045     02/18/14 1000  vancomycin (VANCOCIN) 1,250 mg in sodium chloride 0.9 % 250 mL IVPB     1,250 mg 166.7 mL/hr  over 90 Minutes Intravenous Every 12 hours 02/17/14 2311     02/18/14 0030  ciprofloxacin (CIPRO) IVPB 400 mg  Status:  Discontinued     400 mg 200 mL/hr over 60 Minutes Intravenous Every 12 hours 02/18/14 0023 02/18/14 1040   02/17/14 2230  vancomycin (VANCOCIN) 2,500 mg in sodium chloride 0.9 % 500 mL IVPB     2,500 mg 250 mL/hr over 120 Minutes Intravenous  Once 02/17/14 2201 02/18/14 0118      Social History:  reports that he has been smoking Cigarettes.  He has a 25 pack-year smoking history. He has never used smokeless tobacco. He reports that he drinks alcohol. He reports that he does not use illicit drugs.  Family History  Problem Relation Age of Onset  . Diabetic kidney disease Maternal Uncle     As in HPI and primary teams notes otherwise 12 point review of systems is negative  Blood pressure 123/72, pulse 60, temperature 98.2 F (36.8 C), temperature source Oral, resp. rate 15, height 6' 3"  (1.905 m), weight 285 lb (129.275 kg), SpO2 94.00%. General: Alert and awake, oriented x3, not in any acute distress. HEENT: anicteric sclera, pupils reactive to light and accommodation, EOMI, oropharynx clear and without exudate CVS regular rate, normal r,  no murmur rubs or gallops Chest: clear to auscultation bilaterally, no wheezing, rales or rhonchi Abdomen: soft nontender, nondistended,  normal bowel sounds, Extremities:    Gross asymmetry with R >> L swelling: Pulses intact DP bilaterally        Right foot with hemorrhagic changes in digits.           Lateral area of desqaumation ulceration:      Neuro: nonfocal, strength and sensation intact   Results for orders placed during the hospital encounter of 02/17/14 (from the past 48 hour(s))  CBG MONITORING, ED     Status: Abnormal   Collection Time    02/17/14  9:26 PM      Result Value Ref Range   Glucose-Capillary 156 (*) 70 - 99 mg/dL   Comment 1 Notify RN    CBC WITH DIFFERENTIAL     Status:  Abnormal   Collection Time    02/17/14 10:06 PM      Result Value Ref Range   WBC 8.9  4.0 - 10.5 K/uL   RBC 4.19 (*) 4.22 - 5.81 MIL/uL   Hemoglobin 13.4  13.0 - 17.0 g/dL   HCT 37.9 (*) 39.0 - 52.0 %   MCV 90.5  78.0 - 100.0 fL   MCH 32.0  26.0 - 34.0 pg   MCHC 35.4  30.0 - 36.0 g/dL   RDW 12.7  11.5 - 15.5 %   Platelets 267  150 - 400 K/uL   Neutrophils Relative % 75  43 - 77 %   Neutro Abs 6.7  1.7 - 7.7 K/uL   Lymphocytes Relative 16  12 - 46 %   Lymphs Abs 1.4  0.7 - 4.0 K/uL   Monocytes Relative 9  3 - 12 %   Monocytes Absolute 0.8  0.1 - 1.0 K/uL   Eosinophils Relative 0  0 - 5 %   Eosinophils Absolute 0.0  0.0 - 0.7 K/uL   Basophils Relative 0  0 - 1 %   Basophils Absolute 0.0  0.0 - 0.1 K/uL  BASIC METABOLIC PANEL     Status: Abnormal   Collection Time    02/17/14 10:06 PM      Result Value Ref Range   Sodium 133 (*) 137 - 147 mEq/L   Potassium 4.1  3.7 - 5.3 mEq/L   Chloride 94 (*) 96 - 112 mEq/L   CO2 25  19 - 32 mEq/L   Glucose, Bld 130 (*) 70 - 99 mg/dL   BUN 17  6 - 23 mg/dL   Creatinine, Ser 0.67  0.50 - 1.35 mg/dL   Calcium 9.3  8.4 - 10.5 mg/dL   GFR calc non Af Amer >90  >90 mL/min   GFR calc Af Amer >90  >90 mL/min   Comment: (NOTE)     The eGFR has been calculated using the CKD EPI equation.     This calculation has not been validated in all clinical situations.     eGFR's persistently <90 mL/min signify possible Chronic Kidney     Disease.  CULTURE, BLOOD (ROUTINE X 2)     Status: None   Collection Time    02/17/14 10:07 PM      Result Value Ref Range   Specimen Description BLOOD RIGHT HAND     Special Requests BOTTLES DRAWN AEROBIC AND ANAEROBIC 5CC     Culture  Setup Time       Value: 02/18/2014 01:04     Performed at [facility]   Culture       Value:  BLOOD CULTURE RECEIVED NO GROWTH TO DATE CULTURE WILL BE HELD FOR 5 DAYS BEFORE ISSUING A FINAL NEGATIVE REPORT     Performed at [facility]   Report Status PENDING     CULTURE, BLOOD (ROUTINE X 2)     Status: None   Collection Time    02/17/14 10:34 PM      Result Value Ref Range   Specimen Description BLOOD LEFT ARM     Special Requests BOTTLES DRAWN AEROBIC AND ANAEROBIC 5CC     Culture  Setup Time       Value: 02/18/2014 01:04     Performed at [facility]   Culture       Value:        BLOOD CULTURE RECEIVED NO GROWTH TO DATE CULTURE WILL BE HELD FOR 5 DAYS BEFORE ISSUING A FINAL NEGATIVE REPORT     Performed at [facility]   Report Status PENDING    I-STAT CG4 LACTIC ACID, ED     Status: Abnormal   Collection Time    02/17/14 10:42 PM      Result Value Ref Range   Lactic Acid, Venous 2.49 (*) 0.5 - 2.2 mmol/L  BASIC METABOLIC PANEL     Status: Abnormal   Collection Time    02/18/14  5:30 AM      Result Value Ref Range   Sodium 136 (*) 137 - 147 mEq/L   Potassium 3.8  3.7 - 5.3 mEq/L   Chloride 96  96 - 112 mEq/L   CO2 26  19 - 32 mEq/L   Glucose, Bld 118 (*) 70 - 99 mg/dL   BUN 13  6 - 23 mg/dL   Creatinine, Ser 0.74  0.50 - 1.35 mg/dL   Calcium 8.7  8.4 - 10.5 mg/dL   GFR calc non Af Amer >90  >90 mL/min   GFR calc Af Amer >90  >90 mL/min   Comment: (NOTE)     The eGFR has been calculated using the CKD EPI equation.     This calculation has not been validated in all clinical situations.     eGFR's persistently <90 mL/min signify possible Chronic Kidney     Disease.  CBC     Status: Abnormal   Collection Time    02/18/14  5:30 AM      Result Value Ref Range   WBC 6.9  4.0 - 10.5 K/uL   RBC 3.73 (*) 4.22 - 5.81 MIL/uL   Hemoglobin 12.0 (*) 13.0 - 17.0 g/dL   HCT 33.9 (*) 39.0 - 52.0 %   MCV 90.9  78.0 - 100.0 fL   MCH 32.2  26.0 - 34.0 pg   MCHC 35.4  30.0 - 36.0 g/dL   RDW 12.8  11.5 - 15.5 %   Platelets 212  150 - 400 K/uL  GLUCOSE, CAPILLARY     Status: Abnormal   Collection Time    02/18/14  8:06 AM      Result Value Ref Range   Glucose-Capillary 123 (*) 70 - 99 mg/dL  GLUCOSE, CAPILLARY     Status:  Abnormal   Collection Time    02/18/14 12:20 PM      Result Value Ref Range   Glucose-Capillary 130 (*) 70 - 99 mg/dL  GLUCOSE, CAPILLARY     Status: Abnormal   Collection Time    02/18/14  5:08 PM      Result Value Ref Range   Glucose-Capillary 165 (*)  70 - 99 mg/dL  GLUCOSE, CAPILLARY     Status: Abnormal   Collection Time    02/18/14  9:17 PM      Result Value Ref Range   Glucose-Capillary 132 (*) 70 - 99 mg/dL  GLUCOSE, CAPILLARY     Status: Abnormal   Collection Time    02/19/14  7:37 AM      Result Value Ref Range   Glucose-Capillary 115 (*) 70 - 99 mg/dL  GLUCOSE, CAPILLARY     Status: Abnormal   Collection Time    02/19/14 11:27 AM      Result Value Ref Range   Glucose-Capillary 138 (*) 70 - 99 mg/dL  GLUCOSE, CAPILLARY     Status: Abnormal   Collection Time    02/19/14  5:14 PM      Result Value Ref Range   Glucose-Capillary 124 (*) 70 - 99 mg/dL      Component Value Date/Time   SDES BLOOD LEFT ARM 02/17/2014 2234   SPECREQUEST BOTTLES DRAWN AEROBIC AND ANAEROBIC 5CC 02/17/2014 2234   CULT  Value:        BLOOD CULTURE RECEIVED NO GROWTH TO DATE CULTURE WILL BE HELD FOR 5 DAYS BEFORE ISSUING A FINAL NEGATIVE REPORT Performed at [facility] 02/17/2014 2234   REPTSTATUS PENDING 02/17/2014 2234   Dg Foot Complete Right  02/17/2014   CLINICAL DATA:  Cellulitis.  EXAM: RIGHT FOOT COMPLETE - 3+ VIEW  COMPARISON:  Right foot CT Feb 07, 2014.  FINDINGS: No acute fracture deformity or dislocation. Remote fracture of the distal aspect of the fifth proximal phalanx with apparent nonunion, unchanged. Diffuse soft tissue swelling without subcutaneous gas or radiopaque foreign bodies. Mild first metatarsophalangeal osteoarthrosis. Type 2 navicular bone.  IMPRESSION: Soft tissue swelling consistent with history of cellulitis, without subcutaneous gas. No radiographic findings of osteomyelitis.   Electronically Signed   By: Elon Alas   On: 02/17/2014 23:12     Recent  Results (from the past 720 hour(s))  CULTURE, BLOOD (ROUTINE X 2)     Status: None   Collection Time    02/04/14  9:50 AM      Result Value Ref Range Status   Specimen Description BLOOD RIGHT ARM   Final   Special Requests BOTTLES DRAWN AEROBIC AND ANAEROBIC 6[facility]ER, [place]   Final   Culture  Setup Time     Final   Value: 02/04/2014 14:53     Performed at [facility]   Culture     Final   Value: NO GROWTH 5 DAYS     Performed at [facility]   Report Status 02/10/2014 FINAL   Final  CULTURE, BLOOD (ROUTINE X 2)     Status: None   Collection Time    02/04/14  9:50 AM      Result Value Ref Range Status   Specimen Description BLOOD RIGHT HAND   Final   Special Requests BOTTLES DRAWN AEROBIC AND ANAEROBIC 5CC   Final   Culture  Setup Time     Final   Value: 02/04/2014 14:53     Performed at [facility]   Culture     Final   Value: NO GROWTH 5 DAYS     Performed at [facility]   Report Status 02/10/2014 FINAL   Final  CULTURE, BLOOD (ROUTINE X 2)     Status: None   Collection Time    02/17/14 10:07 PM  Result Value Ref Range Status   Specimen Description BLOOD RIGHT HAND   Final   Special Requests BOTTLES DRAWN AEROBIC AND ANAEROBIC 5CC   Final   Culture  Setup Time     Final   Value: 02/18/2014 01:04     Performed at [facility]   Culture     Final   Value:        BLOOD CULTURE RECEIVED NO GROWTH TO DATE CULTURE WILL BE HELD FOR 5 DAYS BEFORE ISSUING A FINAL NEGATIVE REPORT     Performed at [facility]   Report Status PENDING   Incomplete  CULTURE, BLOOD (ROUTINE X 2)     Status: None   Collection Time    02/17/14 10:34 PM      Result Value Ref Range Status   Specimen Description BLOOD LEFT ARM   Final   Special Requests BOTTLES DRAWN AEROBIC AND ANAEROBIC 5CC   Final   Culture  Setup Time     Final   Value: 02/18/2014 01:04     Performed at [facility]   Culture     Final   Value:        BLOOD CULTURE  RECEIVED NO GROWTH TO DATE CULTURE WILL BE HELD FOR 5 DAYS BEFORE ISSUING A FINAL NEGATIVE REPORT     Performed at [facility]   Report Status PENDING   Incomplete     Impression/Recommendation  Principal Problem:   Cellulitis and abscess of foot Active Problems:   Diabetes mellitus   Bipolar affective disorder   Diabetic ulcer of right foot   James [name] is a 38 y.o. male with history of recurrent cellulitis but now with frank asymmetry tremendous swelling in right calf with pain and hemorrhagic findings in toes.  # 1 Right calf swelling with hemorrhagic findings in toes:  Could he have evolving compartment syndrome?  He clearly has significant pathology in his RLE, whether that by pyomyositis with high pressures, whether it be hematoma, arterial or venous phenemona  He has ruled out for DVT but we do not know about his arterial system   I have ordered MRI of calf and foot  I have ordered TTE  I would also strongly consider CT angiogram of lower extremities and VVS consult  Vancomycin is fine for MRSA coverage here. I dont think he really needs pseudomonal coverage, but will await further clarity from images. Would likely change him to vancomycin and unasyn.  #2 Screening:  Check HIV, hep panel   02/19/2014, 6:37 PM   Thank you so much for this interesting consult  [place] [facility] [phone] (pager) [phone] (office) 02/19/2014, 6:37 PM  [place] 02/19/2014, 6:37 PM

## 2014-02-19 NOTE — Progress Notes (Signed)
VASCULAR LAB PRELIMINARY  PRELIMINARY  PRELIMINARY  PRELIMINARY  Right lower extremity venous duplex completed.    Preliminary report:  Right:  No evidence of DVT, superficial thrombosis, or Baker's cyst.  Nani Ravens, RVT 02/19/2014, 9:49 AM

## 2014-02-20 ENCOUNTER — Inpatient Hospital Stay (HOSPITAL_COMMUNITY): Payer: Medicare Other

## 2014-02-20 DIAGNOSIS — M7981 Nontraumatic hematoma of soft tissue: Secondary | ICD-10-CM | POA: Diagnosis not present

## 2014-02-20 DIAGNOSIS — E119 Type 2 diabetes mellitus without complications: Secondary | ICD-10-CM | POA: Diagnosis not present

## 2014-02-20 DIAGNOSIS — L03119 Cellulitis of unspecified part of limb: Secondary | ICD-10-CM

## 2014-02-20 DIAGNOSIS — M7989 Other specified soft tissue disorders: Secondary | ICD-10-CM

## 2014-02-20 DIAGNOSIS — F319 Bipolar disorder, unspecified: Secondary | ICD-10-CM | POA: Diagnosis not present

## 2014-02-20 DIAGNOSIS — L02619 Cutaneous abscess of unspecified foot: Secondary | ICD-10-CM | POA: Diagnosis not present

## 2014-02-20 DIAGNOSIS — L02419 Cutaneous abscess of limb, unspecified: Secondary | ICD-10-CM | POA: Diagnosis not present

## 2014-02-20 DIAGNOSIS — M79609 Pain in unspecified limb: Secondary | ICD-10-CM | POA: Diagnosis not present

## 2014-02-20 LAB — CBC
HEMATOCRIT: 33.5 % — AB (ref 39.0–52.0)
Hemoglobin: 11.5 g/dL — ABNORMAL LOW (ref 13.0–17.0)
MCH: 31.4 pg (ref 26.0–34.0)
MCHC: 34.3 g/dL (ref 30.0–36.0)
MCV: 91.5 fL (ref 78.0–100.0)
Platelets: 206 10*3/uL (ref 150–400)
RBC: 3.66 MIL/uL — ABNORMAL LOW (ref 4.22–5.81)
RDW: 12.8 % (ref 11.5–15.5)
WBC: 5.5 10*3/uL (ref 4.0–10.5)

## 2014-02-20 LAB — BASIC METABOLIC PANEL
BUN: 6 mg/dL (ref 6–23)
CHLORIDE: 98 meq/L (ref 96–112)
CO2: 28 mEq/L (ref 19–32)
Calcium: 8.7 mg/dL (ref 8.4–10.5)
Creatinine, Ser: 0.62 mg/dL (ref 0.50–1.35)
Glucose, Bld: 113 mg/dL — ABNORMAL HIGH (ref 70–99)
Potassium: 3.9 mEq/L (ref 3.7–5.3)
SODIUM: 136 meq/L — AB (ref 137–147)

## 2014-02-20 LAB — GLUCOSE, CAPILLARY
Glucose-Capillary: 103 mg/dL — ABNORMAL HIGH (ref 70–99)
Glucose-Capillary: 100 mg/dL — ABNORMAL HIGH (ref 70–99)
Glucose-Capillary: 118 mg/dL — ABNORMAL HIGH (ref 70–99)
Glucose-Capillary: 145 mg/dL — ABNORMAL HIGH (ref 70–99)

## 2014-02-20 LAB — HIV ANTIBODY (ROUTINE TESTING W REFLEX): HIV: NONREACTIVE

## 2014-02-20 LAB — HEPATITIS PANEL, ACUTE
HCV AB: NEGATIVE
HEP A IGM: NONREACTIVE
Hep B C IgM: NONREACTIVE
Hepatitis B Surface Ag: NEGATIVE

## 2014-02-20 MED ORDER — IOHEXOL 300 MG/ML  SOLN: 100.0000 mL | Freq: Once | INTRAMUSCULAR | Status: DC | PRN

## 2014-02-20 MED ORDER — FLUOXETINE HCL 20 MG PO CAPS
20.0000 mg | ORAL_CAPSULE | Freq: Every day | ORAL | Status: DC
  Administered 2014-02-21 – 2014-02-22 (×2): 20 mg via ORAL
  Filled 2014-02-20 (×3): qty 1

## 2014-02-20 MED ORDER — VITAMIN B-6 50 MG PO TABS
50.0000 mg | ORAL_TABLET | Freq: Every day | ORAL | Status: DC
  Administered 2014-02-20 – 2014-02-22 (×3): 50 mg via ORAL
  Filled 2014-02-20 (×4): qty 1

## 2014-02-20 MED ORDER — IOHEXOL 350 MG/ML SOLN
125.0000 mL | Freq: Once | INTRAVENOUS | Status: AC | PRN
  Administered 2014-02-20: 125 mL via INTRAVENOUS

## 2014-02-20 MED ORDER — LINEZOLID 600 MG PO TABS
600.0000 mg | ORAL_TABLET | Freq: Two times a day (BID) | ORAL | Status: DC
  Administered 2014-02-20 – 2014-02-22 (×6): 600 mg via ORAL
  Filled 2014-02-20 (×8): qty 1

## 2014-02-20 NOTE — Progress Notes (Signed)
Ankle Brachial Index 1.27 obtain by Jenne Campus RN 02-20-2014 19:03pm

## 2014-02-20 NOTE — Consult Note (Addendum)
[facility] [place]  Patient name: James [name] MRN: [id number] DOB: [birth year] Sex: male  REASON FOR CONSULT: Right lower extremity swelling and infection right foot. The consult is from Dr. Ree Kida.   HPI: James [name] is a 38 y.o. male who developed cellulitis of his right foot back in May was hospitalized and placed on intravenous antibiotics from 02/04/2014 01/31/2014. He was readmitted on 02/18/2014 with worsening redness and swelling in his right foot. He has been on intravenous antibiotics. He has significant right lower extremity swelling. Venous duplex was negative for a DVT. He reportedly had a normal arterial Doppler study during his previous admission. Vascular surgery is consult for further recommendations.  Prior to developing the wound on his right foot, he denies any history of claudication, rest pain, or previous nonhealing ulcers. He denies any history of neuropathy in his feet and states that he has good sensation in his feet. He denies any injury to his foot. He denies any previous history of DVT or phlebitis.  His risk factors for peripheral vascular disease include diabetes and a history of tobacco use. He denies any history of hypertension, hypercholesterolemia, or history of premature cardiovascular disease.  Past Medical History  Diagnosis Date  . Type II diabetes mellitus ~ 2009    "take Metformin" (08/24/2012)  . Bipolar 1 disorder   . Cellulitis 08/24/2012    "RLE and spot on my right forearm" (08/24/2012)   Family History  Problem Relation Age of Onset  . Diabetic kidney disease Maternal Uncle    SOCIAL HISTORY: History  Substance Use Topics  . Smoking status: Current Every Day Smoker -- 1.00 packs/day for 25 years    Types: Cigarettes  . Smokeless tobacco: Never Used     Comment: 08/24/2012 "stopped both snuff and chew ~ 10 yr ago"  . Alcohol Use: 0.0 oz/week   He tells me that he quit smoking cigarettes 3-4  months ago but he now smokes a pipe.  Allergies  Allergen Reactions  . Amoxicillin Nausea And Vomiting   REVIEW OF SYSTEMS: Valu.Nieves ] denotes positive finding; [  ] denotes negative finding CARDIOVASCULAR:  [ ]  chest pain   [ ]  chest pressure   [ ]  palpitations   [ ]  orthopnea   [ ]  dyspnea on exertion   [ ]  claudication   [ ]  rest pain   [ ]  DVT   [ ]  phlebitis PULMONARY:   [ ]  productive cough   [ ]  asthma   [ ]  wheezing NEUROLOGIC:   [ ]  weakness  [ ]  paresthesias  [ ]  aphasia  [ ]  amaurosis  [ ]  dizziness HEMATOLOGIC:   [ ]  bleeding problems   [ ]  clotting disorders MUSCULOSKELETAL:  Valu.Nieves ] joint pain (neck)  [ ]  joint swelling Valu.Nieves ] leg swelling GASTROINTESTINAL: [ ]   blood in stool  [ ]   hematemesis GENITOURINARY:  [ ]   dysuria  [ ]   hematuria PSYCHIATRIC:  [ ]  history of major depression INTEGUMENTARY:  [ ]  rashes  [ ]  ulcers CONSTITUTIONAL:  [ ]  fever   Valu.Nieves ] chills  PHYSICAL EXAM: Filed Vitals:   02/19/14 1413 02/19/14 2200 02/20/14 0525 02/20/14 1400  BP: 123/72 138/82 145/84 150/58  Pulse: 60 60 68 62  Temp: 98.2 F (36.8 C) 98.2 F (36.8 C) 98.2 F (36.8 C) 98.3 F (36.8 C)  TempSrc: Oral Oral Oral Oral  Resp: 15 18 18 18   Height:  Weight:      SpO2: 94% 97% 93% 94%   Body mass index is 35.62 kg/(m^2). GENERAL: The patient is a well-nourished male, in no acute distress. The vital signs are documented above. CARDIOVASCULAR: There is a regular rate and rhythm. I do not detect carotid bruits. He has palpable femoral, popliteal, and dorsalis pedis pulses bilaterally. He has a left posterior tibial pulse. It is difficult to feel his right posterior tibial pulse because of his leg swelling. He has significant right lower extremity swelling up to his hip PULMONARY: There is good air exchange bilaterally without wheezing or rales. ABDOMEN: Soft and non-tender with normal pitched bowel sounds.  MUSCULOSKELETAL: There are no major deformities or cyanosis. NEUROLOGIC: No focal  weakness or paresthesias are detected. SKIN: he has cellulitis of the right foot and also extending up the anterior lateral aspect of his calf. PSYCHIATRIC: The patient has a normal affect.  DATA:  Lab Results  Component Value Date   WBC 5.5 02/20/2014   HGB 11.5* 02/20/2014   HCT 33.5* 02/20/2014   MCV 91.5 02/20/2014   PLT 206 02/20/2014   Lab Results  Component Value Date   CREATININE 0.62 02/20/2014   Lab Results  Component Value Date   HGBA1C 6.2* 10/28/2013   CBG (last 3)   Recent Labs  02/20/14 0752 02/20/14 1241 02/20/14 1636  GLUCAP 103* 100* 118*   CT ANGIOGRAM THE RIGHT LOWER EXTREMITY: This shows no evidence of significant arterial occlusive disease or source for potential embolic disease. There is no abscess identified in the foot or leg. There is no evidence of DVT in the external iliac vein as high up as is visualized. Likewise I do not see any mass which would cause lymphatic obstruction.  MRI of the right leg shows no abscess and no evidence of mild fasciitis or osteomyelitis.  MRI of the foot shows "Freiberg's infraction is present in the second metatarsal head with  bone marrow edema and flattening."  Venous duplex of the right lower extremity yesterday shows no evidence of DVT or phlebitis.  Venous duplex scan on 02/04/2014 also showed no evidence of DVT of the right lower extremity.  I am unable to find previous ABIs but these were reportedly normal. Follow up ABIs have been ordered.  MEDICAL ISSUES:  CELLULITIS RIGHT LEG AND FOOT WITH SIGNIFICANT RIGHT LOWER EXTREMITY SWELLING: I have reviewed his CT Angio of the right LE which shows no evidence of arterial occlusive disease or potential source of embolic disease to his right foot. In addition, he has palpable pedal pulses. He has no evidence of significant arterial insufficiency. Likewise he has had 2 venous duplex scans which were negative. I do not see any evidence of external iliac DVT on his CT. He appears  to have significant lymphedema of the right lower extremity, which could be making his cellulitis more difficult to treat. The only other consideration would be to obtain a CT of the abdomen and pelvis to look for anything that might be obstructing his lymphatics on the right or to look for a more proximal DVT which is not being seen on his CT of the right lower extremity. I discussed with him the importance of leg elevation the proper positioning for this. Unfortunately it is difficult to get him in the proper position in his current hospital bed. No further workup from a vascular standpoint unless the primary service elects  to proceed with CT of the abdomen and pelvis as discussed above. We  will be available as needed.  Angelia Mould [facility] [place] Beeper: [phone]

## 2014-02-20 NOTE — Progress Notes (Signed)
[facility]                                                                              Patient Demographics  James [name], is a 38 y.o. male, DOB - December 10, 1975, [id number]  Admit date - 02/17/2014   Admitting Physician Theressa Millard, MD  Outpatient Primary MD for the patient is Pcp Not In System  LOS - 3   Chief Complaint  Patient presents with  . Cellulitis    recurrent      HPI/interim history: James [name] is a 38 y.o. male with a history of diabetes who presented to the ED with complaints of worsening redness and swelling of his right foot. He has ulcers on his right foot for 3 to 4 weeks and was last hospitalized on 05/28 for cellulitis. He reported having fevers and chills.  Patient continues to have edema of the right lower extremity. Infectious disease was consulted and recommended echocardiogram, MRI, vascular consult. MRI shows cellulitis, echocardiogram no endocarditis, hep panel and HIV still pending.  Assessment & Plan   Recurrent Cellulitis of the right foot -Patient was recently admitted and discharged in May of 2015 for cellulitis of the same foot, and was discharged with Augmentin and doxycycline (however, he never obtained the augmentin as he has an allergy). -Will continue patient on vancomycin and zosyn -Wound care consulted and appreciated -Patient had ABI on 02/07/2014 which was normal -Lower extremity Doppler on 02/04/2014 showed no evidence of DVT or superficial thrombosis in the right lower extremity and left common femoral vein -Right LE doppler negative for DVT -Infectious disease consulted and appreciated. Recommended Echocardiogram MRI of the RLE, vascular surgery consult, HIV/Hep panel -Echocardiogram: EF 60-65%, no endocarditis the no valvular abnormalities. -MR RLE: Diffuse subcutaneous soft tissue swelling and/or edema representing cellulitis, no osteo or septic arthritis or mild fasciitis -Pending HIV and hep  panel -Will speak with vascular surgery  Diabetes mellitus type 2 -Continue insulin sliding scale as well with CBG monitoring -Hemoglobin A1c on 10/28/2013 was 6.2  Bipolar disorder -Continue saphenous, Depakote, Risperdal  Depression -Continue Prozac  Code Status: Full  Family Communication: None at bedside  Disposition Plan: Admitted  Time Spent in minutes   30 minutes  Procedures  RLE Doppler: Preliminary report: Right: No evidence of DVT, superficial thrombosis, or Baker's cyst.  Echocardiogram Impressions: Normal LV size and systolic function, EF 123456. Mild LV hypertrophy. Normal RV size and systolic function. No significant valvular abnormalities. No evidence for endocarditis seen.  Consults   Infectious disease Wound Care Vascular surgery  DVT Prophylaxis  Lovenox   Lab Results  Component Value Date   PLT 206 02/20/2014    Medications  Scheduled Meds: . asenapine  5 mg Sublingual BID  . collagenase   Topical Daily  . divalproex  2,000 mg Oral QHS  . enoxaparin (LOVENOX) injection  60 mg Subcutaneous Daily  . FLUoxetine  40 mg Oral Daily  . insulin aspart  0-5 Units Subcutaneous QHS  . insulin aspart  0-9 Units Subcutaneous TID WC  . pantoprazole  40 mg Oral Daily  . piperacillin-tazobactam (ZOSYN)  IV  3.375 g Intravenous Q8H  . risperidone  4 mg Oral QHS  . vancomycin  1,250 mg Intravenous Q8H   Continuous Infusions: . sodium chloride 100 mL/hr at 02/20/14 0256   PRN Meds:.acetaminophen, acetaminophen, alum & mag hydroxide-simeth, HYDROmorphone (DILAUDID) injection, ondansetron (ZOFRAN) IV, ondansetron, oxyCODONE  Antibiotics    Anti-infectives   Start     Dose/Rate Route Frequency Ordered Stop   02/20/14 0800  vancomycin (VANCOCIN) 1,250 mg in sodium chloride 0.9 % 250 mL IVPB     1,250 mg 166.7 mL/hr over 90 Minutes Intravenous Every 8 hours 02/19/14 2300     02/19/14 2315  vancomycin (VANCOCIN) 1,500 mg in sodium chloride 0.9 % 500 mL IVPB      1,500 mg 250 mL/hr over 120 Minutes Intravenous  Once 02/19/14 2300 02/20/14 0138   02/18/14 1200  piperacillin-tazobactam (ZOSYN) IVPB 3.375 g     3.375 g 12.5 mL/hr over 240 Minutes Intravenous Every 8 hours 02/18/14 1045     02/18/14 1000  vancomycin (VANCOCIN) 1,250 mg in sodium chloride 0.9 % 250 mL IVPB  Status:  Discontinued     1,250 mg 166.7 mL/hr over 90 Minutes Intravenous Every 12 hours 02/17/14 2311 02/19/14 2300   02/18/14 0030  ciprofloxacin (CIPRO) IVPB 400 mg  Status:  Discontinued     400 mg 200 mL/hr over 60 Minutes Intravenous Every 12 hours 02/18/14 0023 02/18/14 1040   02/17/14 2230  vancomycin (VANCOCIN) 2,500 mg in sodium chloride 0.9 % 500 mL IVPB     2,500 mg 250 mL/hr over 120 Minutes Intravenous  Once 02/17/14 2201 02/18/14 0118        Subjective:   James [name] seen and examined today.  Patient states he still has pain in right foot and leg, however, the pain is worse when he moves.  Denies chest pain, shortness of breath, abdominal pain.  Objective:   Filed Vitals:   02/19/14 1038 02/19/14 1413 02/19/14 2200 02/20/14 0525  BP: 123/71 123/72 138/82 145/84  Pulse: 59 60 60 68  Temp: 97.7 F (36.5 C) 98.2 F (36.8 C) 98.2 F (36.8 C) 98.2 F (36.8 C)  TempSrc:  Oral Oral Oral  Resp: 16 15 18 18   Height:      Weight:      SpO2: 95% 94% 97% 93%    Wt Readings from Last 3 Encounters:  02/18/14 129.275 kg (285 lb)  02/04/14 127.007 kg (280 lb)  10/27/13 127.007 kg (280 lb)     Intake/Output Summary (Last 24 hours) at 02/20/14 0737 Last data filed at 02/20/14 0600  Gross per 24 hour  Intake   3120 ml  Output   5375 ml  Net  -2255 ml    Exam  General: Well developed, well nourished, NAD, appears stated age  HEENT: NCAT, mucous membranes moist.  Neck: Supple, no JVD, no masses  Cardiovascular: S1 S2 auscultated, no rubs, murmurs or gallops. Regular rate and rhythm.  Respiratory: Clear to auscultation bilaterally with equal  chest rise  Abdomen: Soft, obese, nontender, nondistended, + bowel sounds  Extremities: RLE edema and erythema with ulcer on the right medial mallelous, 2nd and 3rd toes, currently wrapped.  LLE no clubbing, cyanosis, or edema  Neuro: AAOx3, no focal deficits  Psych: Normal affect and demeanor with intact judgement and insight  Data Review   Micro Results Recent Results (from the past 240 hour(s))  CULTURE, BLOOD (ROUTINE X 2)     Status: None   Collection Time    02/17/14 10:07 PM  Result Value Ref Range Status   Specimen Description BLOOD RIGHT HAND   Final   Special Requests BOTTLES DRAWN AEROBIC AND ANAEROBIC 5CC   Final   Culture  Setup Time     Final   Value: 02/18/2014 01:04     Performed at [facility]   Culture     Final   Value:        BLOOD CULTURE RECEIVED NO GROWTH TO DATE CULTURE WILL BE HELD FOR 5 DAYS BEFORE ISSUING A FINAL NEGATIVE REPORT     Performed at [facility]   Report Status PENDING   Incomplete  CULTURE, BLOOD (ROUTINE X 2)     Status: None   Collection Time    02/17/14 10:34 PM      Result Value Ref Range Status   Specimen Description BLOOD LEFT ARM   Final   Special Requests BOTTLES DRAWN AEROBIC AND ANAEROBIC 5CC   Final   Culture  Setup Time     Final   Value: 02/18/2014 01:04     Performed at [facility]   Culture     Final   Value:        BLOOD CULTURE RECEIVED NO GROWTH TO DATE CULTURE WILL BE HELD FOR 5 DAYS BEFORE ISSUING A FINAL NEGATIVE REPORT     Performed at [facility]   Report Status PENDING   Incomplete    Radiology Reports Dg Ankle Complete Right  02/04/2014   CLINICAL DATA:  Right ankle swelling and pain.  EXAM: RIGHT ANKLE - COMPLETE 3+ VIEW  COMPARISON:  08/24/2012  FINDINGS: Soft tissue swelling is present.  There is no evidence of fracture, subluxation or dislocation.  No radiographic evidence of osteomyelitis is noted.  No focal bony lesions are present.  IMPRESSION: Soft tissue  swelling without acute bony abnormality.   Electronically Signed   By: Hassan Rowan M.D.   On: 02/04/2014 09:23   Ct Foot Right W Contrast  02/07/2014   CLINICAL DATA:  Cellulitis from the right foot extending to the right calf. Swelling and redness. Diabetes.  EXAM: CT OF THE RIGHT FOOT WITH CONTRAST; CT OF THE RIGHT TIBIA AND FIBULA WITH CONTRAST  TECHNIQUE: Multidetector CT imaging was performed following the standard protocol during bolus administration of intravenous contrast.  CONTRAST:  41mL OMNIPAQUE IOHEXOL 300 MG/ML  SOLN  COMPARISON:  None.  FINDINGS: There is subcutaneous edema extending from the foot proximally to the level of the knee. There is no soft tissue abscess. The anterior and posterior muscular compartments are within normal limits. Negative for osteomyelitis. Tibia and fibula appear normal. No ankle effusion is present. Accessory ossicles are present in the foot, incidentally noted including at the anterior process of the calcaneus. No ulcerations are identified. Calcaneal spurs are present. There is no radiopaque foreign body.  On the scout images, the contralateral leg is partially visualized and shows hardware compatible with an old ORIF. Neurovascular bundles appear within normal limits.  IMPRESSION: Cellulitis of the right foot and leg. No soft tissue abscess and no osteomyelitis.   Electronically Signed   By: Dereck Ligas M.D.   On: 02/07/2014 12:27   Ct Tibia Fibula Right W Contrast  02/07/2014   CLINICAL DATA:  Cellulitis from the right foot extending to the right calf. Swelling and redness. Diabetes.  EXAM: CT OF THE RIGHT FOOT WITH CONTRAST; CT OF THE RIGHT TIBIA AND FIBULA WITH CONTRAST  TECHNIQUE: Multidetector CT imaging was performed following  the standard protocol during bolus administration of intravenous contrast.  CONTRAST:  36mL OMNIPAQUE IOHEXOL 300 MG/ML  SOLN  COMPARISON:  None.  FINDINGS: There is subcutaneous edema extending from the foot proximally to the level  of the knee. There is no soft tissue abscess. The anterior and posterior muscular compartments are within normal limits. Negative for osteomyelitis. Tibia and fibula appear normal. No ankle effusion is present. Accessory ossicles are present in the foot, incidentally noted including at the anterior process of the calcaneus. No ulcerations are identified. Calcaneal spurs are present. There is no radiopaque foreign body.  On the scout images, the contralateral leg is partially visualized and shows hardware compatible with an old ORIF. Neurovascular bundles appear within normal limits.  IMPRESSION: Cellulitis of the right foot and leg. No soft tissue abscess and no osteomyelitis.   Electronically Signed   By: Dereck Ligas M.D.   On: 02/07/2014 12:27   Dg Foot Complete Right  02/17/2014   CLINICAL DATA:  Cellulitis.  EXAM: RIGHT FOOT COMPLETE - 3+ VIEW  COMPARISON:  Right foot CT Feb 07, 2014.  FINDINGS: No acute fracture deformity or dislocation. Remote fracture of the distal aspect of the fifth proximal phalanx with apparent nonunion, unchanged. Diffuse soft tissue swelling without subcutaneous gas or radiopaque foreign bodies. Mild first metatarsophalangeal osteoarthrosis. Type 2 navicular bone.  IMPRESSION: Soft tissue swelling consistent with history of cellulitis, without subcutaneous gas. No radiographic findings of osteomyelitis.   Electronically Signed   By: Elon Alas   On: 02/17/2014 23:12    CBC  Recent Labs Lab 02/17/14 2206 02/18/14 0530 02/20/14 0528  WBC 8.9 6.9 5.5  HGB 13.4 12.0* 11.5*  HCT 37.9* 33.9* 33.5*  PLT 267 212 206  MCV 90.5 90.9 91.5  MCH 32.0 32.2 31.4  MCHC 35.4 35.4 34.3  RDW 12.7 12.8 12.8  LYMPHSABS 1.4  --   --   MONOABS 0.8  --   --   EOSABS 0.0  --   --   BASOSABS 0.0  --   --     Chemistries   Recent Labs Lab 02/17/14 2206 02/18/14 0530 02/20/14 0528  NA 133* 136* 136*  K 4.1 3.8 3.9  CL 94* 96 98  CO2 25 26 28   GLUCOSE 130* 118* 113*    BUN 17 13 6   CREATININE 0.67 0.74 0.62  CALCIUM 9.3 8.7 8.7   ------------------------------------------------------------------------------------------------------------------ estimated creatinine clearance is 177.8 ml/min (by C-G formula based on Cr of 0.62). ------------------------------------------------------------------------------------------------------------------ No results found for this basename: HGBA1C,  in the last 72 hours ------------------------------------------------------------------------------------------------------------------ No results found for this basename: CHOL, HDL, LDLCALC, TRIG, CHOLHDL, LDLDIRECT,  in the last 72 hours ------------------------------------------------------------------------------------------------------------------ No results found for this basename: TSH, T4TOTAL, FREET3, T3FREE, THYROIDAB,  in the last 72 hours ------------------------------------------------------------------------------------------------------------------ No results found for this basename: VITAMINB12, FOLATE, FERRITIN, TIBC, IRON, RETICCTPCT,  in the last 72 hours  Coagulation profile No results found for this basename: INR, PROTIME,  in the last 168 hours  No results found for this basename: DDIMER,  in the last 72 hours  Cardiac Enzymes No results found for this basename: CK, CKMB, TROPONINI, MYOGLOBIN,  in the last 168 hours ------------------------------------------------------------------------------------------------------------------ No components found with this basename: POCBNP,     Leng Montesdeoca D.O. on 02/20/2014 at 7:37 AM  Between 7am to 7pm - Pager - [phone]  After 7pm go to [email or web address] - password [email or web address]  And look for the night coverage person covering for me after hours  [place]  [phone]

## 2014-02-20 NOTE — Progress Notes (Signed)
[facility]  Vancomycin day # 4 Zosyn day # 4  Subjective: Having pain more proximally in thight   Antibiotics:  Anti-infectives   Start     Dose/Rate Route Frequency Ordered Stop   02/20/14 1430  linezolid (ZYVOX) tablet 600 mg     600 mg Oral Every 12 hours 02/20/14 1355     02/20/14 0800  vancomycin (VANCOCIN) 1,250 mg in sodium chloride 0.9 % 250 mL IVPB  Status:  Discontinued     1,250 mg 166.7 mL/hr over 90 Minutes Intravenous Every 8 hours 02/19/14 2300 02/20/14 1355   02/19/14 2315  vancomycin (VANCOCIN) 1,500 mg in sodium chloride 0.9 % 500 mL IVPB     1,500 mg 250 mL/hr over 120 Minutes Intravenous  Once 02/19/14 2300 02/20/14 0138   02/18/14 1200  piperacillin-tazobactam (ZOSYN) IVPB 3.375 g     3.375 g 12.5 mL/hr over 240 Minutes Intravenous Every 8 hours 02/18/14 1045     02/18/14 1000  vancomycin (VANCOCIN) 1,250 mg in sodium chloride 0.9 % 250 mL IVPB  Status:  Discontinued     1,250 mg 166.7 mL/hr over 90 Minutes Intravenous Every 12 hours 02/17/14 2311 02/19/14 2300   02/18/14 0030  ciprofloxacin (CIPRO) IVPB 400 mg  Status:  Discontinued     400 mg 200 mL/hr over 60 Minutes Intravenous Every 12 hours 02/18/14 0023 02/18/14 1040   02/17/14 2230  vancomycin (VANCOCIN) 2,500 mg in sodium chloride 0.9 % 500 mL IVPB     2,500 mg 250 mL/hr over 120 Minutes Intravenous  Once 02/17/14 2201 02/18/14 0118      Medications: Scheduled Meds: . asenapine  5 mg Sublingual BID  . collagenase   Topical Daily  . divalproex  2,000 mg Oral QHS  . enoxaparin (LOVENOX) injection  60 mg Subcutaneous Daily  . [START ON 02/21/2014] FLUoxetine  20 mg Oral Daily  . insulin aspart  0-5 Units Subcutaneous QHS  . insulin aspart  0-9 Units Subcutaneous TID WC  . linezolid  600 mg Oral Q12H  . pantoprazole  40 mg Oral Daily  . piperacillin-tazobactam (ZOSYN)  IV  3.375 g Intravenous Q8H  . vitamin B-6  50 mg Oral Daily  . risperidone  4 mg Oral QHS    Continuous Infusions: . sodium chloride 100 mL/hr at 02/20/14 1403   PRN Meds:.acetaminophen, acetaminophen, alum & mag hydroxide-simeth, HYDROmorphone (DILAUDID) injection, ondansetron (ZOFRAN) IV, ondansetron, oxyCODONE    Objective: Weight change:   Intake/Output Summary (Last 24 hours) at 02/20/14 1435 Last data filed at 02/20/14 1045  Gross per 24 hour  Intake   1840 ml  Output   5275 ml  Net  -3435 ml   Blood pressure 145/84, pulse 68, temperature 98.2 F (36.8 C), temperature source Oral, resp. rate 18, height 6\' 3"  (1.905 m), weight 285 lb (129.275 kg), SpO2 93.00%. Temp:  [98.2 F (36.8 C)] 98.2 F (36.8 C) (06/09 0525) Pulse Rate:  [60-68] 68 (06/09 0525) Resp:  [18] 18 (06/09 0525) BP: (138-145)/(82-84) 145/84 mmHg (06/09 0525) SpO2:  [93 %-97 %] 93 % (06/09 0525)  Physical Exam: General: Alert and awake, oriented x3, not in any acute distress.  HEENT: anicteric sclera, pupils reactive to light and accommodation, EOMI, oropharynx clear and without exudate  CVS regular rate, normal r, no murmur rubs or gallops  Chest: clear to auscultation bilaterally, no wheezing, rales or rhonchi  Abdomen: soft nontender, nondistended, normal bowel sounds,  Extremities:   Right  leg yesterday:     Today 02/20/14:      Right foot films yesterday:      Films today 02/20/14 albeit with dressing still in place:      Neuro: nonfocal  CBC:  Recent Labs Lab 02/17/14 2206 02/18/14 0530 02/20/14 0528  HGB 13.4 12.0* 11.5*  HCT 37.9* 33.9* 33.5*  PLT 267 212 206     BMET  Recent Labs  02/18/14 0530 02/20/14 0528  NA 136* 136*  K 3.8 3.9  CL 96 98  CO2 26 28  GLUCOSE 118* 113*  BUN 13 6  CREATININE 0.74 0.62  CALCIUM 8.7 8.7     Liver Panel  No results found for this basename: PROT, ALBUMIN, AST, ALT, ALKPHOS, BILITOT, BILIDIR, IBILI,  in the last 72 hours     Sedimentation Rate No results found for this basename: ESRSEDRATE,  in the  last 72 hours C-Reactive Protein No results found for this basename: CRP,  in the last 72 hours  Micro Results: Recent Results (from the past 240 hour(s))  CULTURE, BLOOD (ROUTINE X 2)     Status: None   Collection Time    02/17/14 10:07 PM      Result Value Ref Range Status   Specimen Description BLOOD RIGHT HAND   Final   Special Requests BOTTLES DRAWN AEROBIC AND ANAEROBIC 5CC   Final   Culture  Setup Time     Final   Value: 02/18/2014 01:04     Performed at [facility]   Culture     Final   Value:        BLOOD CULTURE RECEIVED NO GROWTH TO DATE CULTURE WILL BE HELD FOR 5 DAYS BEFORE ISSUING A FINAL NEGATIVE REPORT     Performed at [facility]   Report Status PENDING   Incomplete  CULTURE, BLOOD (ROUTINE X 2)     Status: None   Collection Time    02/17/14 10:34 PM      Result Value Ref Range Status   Specimen Description BLOOD LEFT ARM   Final   Special Requests BOTTLES DRAWN AEROBIC AND ANAEROBIC 5CC   Final   Culture  Setup Time     Final   Value: 02/18/2014 01:04     Performed at [facility]   Culture     Final   Value:        BLOOD CULTURE RECEIVED NO GROWTH TO DATE CULTURE WILL BE HELD FOR 5 DAYS BEFORE ISSUING A FINAL NEGATIVE REPORT     Performed at [facility]   Report Status PENDING   Incomplete    Studies/Results: Ct Angio Low Extrem Right W/cm &/or Wo/cm  02/20/2014   CLINICAL DATA:  Right foot pain and swelling with persistent cellulitis, evaluate for arterial thrombosis  EXAM: CT ANGIOGRAPHY OF THE RIGHT LOWEREXTREMITY  TECHNIQUE: Multidetector CT imaging of the right lowerwas performed using the standard protocol during bolus administration of intravenous contrast. Multiplanar CT image reconstructions and MIPs were obtained to evaluate the vascular anatomy.  CONTRAST:  140mL OMNIPAQUE IOHEXOL 350 MG/ML SOLN  COMPARISON:  Bilateral lower extremity MRI - 02/19/2014  FINDINGS: RIGHT LOWER EXTREMITY VASCULATURE:  Right-sided pelvic  vasculature: The imaged distal aspect of the right external iliac artery is normal without evidence of hemodynamically significant stenosis.  Right common femoral artery: Widely patent without hemodynamically significant stenosis.  Right deep femoral artery: Widely patent without hemodynamically significant stenosis.  Right superficial femoral artery: Widely patent  without hemodynamically significant stenosis.  Right popliteal artery: Both the right above and below-knee popliteal arteries are widely patent without hemodynamically significant stenosis.  Right lower leg: There is a three-vessel runoff to the level of the lower leg and foot. The peroneal artery appropriately tapers at the level of the lower leg. The dorsalis pedis artery is patent to the level of the forefoot. There are no discrete filling defects within the distal vascular tree of the right lower extremity to suggest distal embolism.  Review of the MIP images confirms the above findings.   --------------------------------------------------------------------------------  Nonvascular Findings:  There is diffuse soft tissue swelling about the lower leg and foot extending to involve the anterior lateral aspect of the thigh. No defined/drainable fluid collections. No acute or aggressive osseous abnormalities. No discrete areas of osteolysis to suggest osteomyelitis. No subcutaneous emphysema or radiopaque foreign body.  Shotty right inguinal lymph nodes with index right inguinal lymph node measuring 1.1 cm in greatest short axis diameter (image 78, series 6) and index right external iliac chain lymph node measuring approximately 1.3 cm (image 20). Subcutaneous stranding within the caudal aspect of the right abdominal pannus, likely at the site of prior subdermal medication administration.  IMPRESSION: 1. No evidence of hemodynamically significant stenosis or distal embolism affecting the right lower extremity. 2. Diffuse soft tissue swelling about the  lower leg and foot suggestive of cellulitis. No defined/drainable fluid collection. 3. Shotty right inguinal and external iliac chain lymph nodes, presumably reactive in etiology.   Electronically Signed   By: Sandi Mariscal M.D.   On: 02/20/2014 13:35   Mr Foot Right W Wo Contrast  02/20/2014   CLINICAL DATA:  Right calf redness and swelling. Hemorrhagic lesions in the toes. Diabetic patient.  EXAM: MRI OF THE RIGHT FOREFOOT WITHOUT AND WITH CONTRAST  TECHNIQUE: Multiplanar, multisequence MR imaging was performed both before and after administration of intravenous contrast.  CONTRAST:  72mL MULTIHANCE GADOBENATE DIMEGLUMINE 529 MG/ML IV SOLN  COMPARISON:  None.  FINDINGS: Freiberg's infraction is present in the second metatarsal head with bone marrow edema and flattening. Diffuse forefoot edema is present which is nonspecific, likely representing cellulitis. This is more prominent over the dorsum of the foot. There are no findings to suggest osteomyelitis. The flexor and extensor tendons are within normal limits. No abscess.  After contrast administration, there is enhancement of the Freiberg's infraction, including soft tissue enhancement around the second metatarsal.  IMPRESSION: Freiberg's infraction of the second metatarsal head.   Electronically Signed   By: Dereck Ligas M.D.   On: 02/20/2014 08:22   Mr Tibia Fibula Right W Wo Contrast  02/20/2014   CLINICAL DATA:  Pain and swelling.  EXAM: MRI OF LOWER RIGHT EXTREMITY WITHOUT AND WITH CONTRAST  TECHNIQUE: Multiplanar, multisequence MR imaging of the lower right extremity was performed both before and after administration of intravenous contrast.  CONTRAST:  20 cc MultiHance  COMPARISON:  02/19/2014  FINDINGS: Diffuse subcutaneous soft tissue swelling/ edema involving the entire image right lower extremity most consistent with cellulitis. No findings for myofasciitis or osteomyelitis. No rim enhancing fluid collection to suggest a discrete drainable  abscess. No findings for septic arthritis involving the knee or the ankle. Minimal areas of signal abnormality in the left soleus muscle could be due to muscle tear or nonspecific myositis. Muscle infarct is possible also.  IMPRESSION: Diffuse subcutaneous soft tissue swelling/ edema likely representing cellulitis. No focal drainable soft tissue abscess, myofasciitis, septic arthritis or osteomyelitis.   Electronically  Signed   By: Kalman Jewels M.D.   On: 02/20/2014 08:45      Assessment/Plan:  Principal Problem:   Cellulitis and abscess of foot Active Problems:   Diabetes mellitus   Bipolar affective disorder   Diabetic ulcer of right foot    James [name] is a 38 y.o. male with history of recurrent cellulitis but now with frank asymmetry tremendous swelling in right calf with pain and hemorrhagic findings in toes.   # 1 Right calf swelling with hemorrhagic findings in toes:   MRI did NOT show myosiits but possible muscle infarct and it did show Freiberg's infraction in 2nd metatarsal  CT did NOT show an arterial or venous pathology  Today he has more proximal swelling in leg  I am worried that this may be an early pyomyositis as part of picture  I will swap in zyvox for vancomycin  May need to reimage in 7 days with another MRI  Would ask Orthopedics for recs re the Freiberg's infraction    #2 Screening: HIV and Hep panel negative      LOS: 3 days   Truman Hayward 02/20/2014, 2:35 PM

## 2014-02-20 NOTE — ED Provider Notes (Signed)
Medical screening examination/treatment/procedure(s) were conducted as a shared visit with non-physician practitioner(s) and myself.  I personally evaluated the patient during the encounter.   EKG Interpretation   Date/Time:  Saturday February 17 2014 21:35:44 EDT Ventricular Rate:  87 PR Interval:  143 QRS Duration: 100 QT Interval:  381 QTC Calculation: 458 R Axis:   69 Text Interpretation:  Sinus rhythm Probable left ventricular hypertrophy  Anterior ST elevation, probably due to LVH compared to prior, nonspecific  ST changes similar Confirmed by [facility]  MD, TREY ([id number]) on 02/18/2014  12:10:13 AM        Houston Siren III, MD 02/20/14 [phone]

## 2014-02-21 DIAGNOSIS — M9270 Juvenile osteochondrosis of metatarsus, unspecified foot: Secondary | ICD-10-CM | POA: Diagnosis present

## 2014-02-21 DIAGNOSIS — M7989 Other specified soft tissue disorders: Secondary | ICD-10-CM | POA: Diagnosis not present

## 2014-02-21 DIAGNOSIS — M79609 Pain in unspecified limb: Secondary | ICD-10-CM | POA: Diagnosis not present

## 2014-02-21 DIAGNOSIS — L02419 Cutaneous abscess of limb, unspecified: Secondary | ICD-10-CM | POA: Diagnosis not present

## 2014-02-21 DIAGNOSIS — M7981 Nontraumatic hematoma of soft tissue: Secondary | ICD-10-CM | POA: Diagnosis not present

## 2014-02-21 DIAGNOSIS — L97509 Non-pressure chronic ulcer of other part of unspecified foot with unspecified severity: Secondary | ICD-10-CM | POA: Diagnosis not present

## 2014-02-21 LAB — BASIC METABOLIC PANEL
BUN: 6 mg/dL (ref 6–23)
CHLORIDE: 99 meq/L (ref 96–112)
CO2: 27 meq/L (ref 19–32)
Calcium: 8.9 mg/dL (ref 8.4–10.5)
Creatinine, Ser: 0.65 mg/dL (ref 0.50–1.35)
GFR calc non Af Amer: >90 mL/min (ref >90)
Glucose, Bld: 114 mg/dL — ABNORMAL HIGH (ref 70–99)
Potassium: 4 mEq/L (ref 3.7–5.3)
Sodium: 137 mEq/L (ref 137–147)

## 2014-02-21 LAB — CBC
HEMATOCRIT: 35 % — AB (ref 39.0–52.0)
Hemoglobin: 12.1 g/dL — ABNORMAL LOW (ref 13.0–17.0)
MCH: 31.8 pg (ref 26.0–34.0)
MCHC: 34.6 g/dL (ref 30.0–36.0)
MCV: 91.9 fL (ref 78.0–100.0)
PLATELETS: 206 10*3/uL (ref 150–400)
RBC: 3.81 MIL/uL — AB (ref 4.22–5.81)
RDW: 12.7 % (ref 11.5–15.5)
WBC: 4.9 10*3/uL (ref 4.0–10.5)

## 2014-02-21 LAB — GLUCOSE, CAPILLARY
GLUCOSE-CAPILLARY: 107 mg/dL — AB (ref 70–99)
Glucose-Capillary: 113 mg/dL — ABNORMAL HIGH (ref 70–99)
Glucose-Capillary: 106 mg/dL — ABNORMAL HIGH (ref 70–99)
Glucose-Capillary: 110 mg/dL — ABNORMAL HIGH (ref 70–99)

## 2014-02-21 NOTE — Progress Notes (Signed)
[facility]  Zyvox day #2  had 4 days of vancomycin as well Vancomycin day # 4 Zosyn day # 5  Subjective: Thigh pain and swelling going down he thinks   Antibiotics:  Anti-infectives   Start     Dose/Rate Route Frequency Ordered Stop   02/20/14 1430  linezolid (ZYVOX) tablet 600 mg     600 mg Oral Every 12 hours 02/20/14 1355     02/20/14 0800  vancomycin (VANCOCIN) 1,250 mg in sodium chloride 0.9 % 250 mL IVPB  Status:  Discontinued     1,250 mg 166.7 mL/hr over 90 Minutes Intravenous Every 8 hours 02/19/14 2300 02/20/14 1355   02/19/14 2315  vancomycin (VANCOCIN) 1,500 mg in sodium chloride 0.9 % 500 mL IVPB     1,500 mg 250 mL/hr over 120 Minutes Intravenous  Once 02/19/14 2300 02/20/14 0138   02/18/14 1200  piperacillin-tazobactam (ZOSYN) IVPB 3.375 g  Status:  Discontinued     3.375 g 12.5 mL/hr over 240 Minutes Intravenous Every 8 hours 02/18/14 1045 02/21/14 1702   02/18/14 1000  vancomycin (VANCOCIN) 1,250 mg in sodium chloride 0.9 % 250 mL IVPB  Status:  Discontinued     1,250 mg 166.7 mL/hr over 90 Minutes Intravenous Every 12 hours 02/17/14 2311 02/19/14 2300   02/18/14 0030  ciprofloxacin (CIPRO) IVPB 400 mg  Status:  Discontinued     400 mg 200 mL/hr over 60 Minutes Intravenous Every 12 hours 02/18/14 0023 02/18/14 1040   02/17/14 2230  vancomycin (VANCOCIN) 2,500 mg in sodium chloride 0.9 % 500 mL IVPB     2,500 mg 250 mL/hr over 120 Minutes Intravenous  Once 02/17/14 2201 02/18/14 0118      Medications: Scheduled Meds: . asenapine  5 mg Sublingual BID  . collagenase   Topical Daily  . divalproex  2,000 mg Oral QHS  . enoxaparin (LOVENOX) injection  60 mg Subcutaneous Daily  . FLUoxetine  20 mg Oral Daily  . insulin aspart  0-5 Units Subcutaneous QHS  . insulin aspart  0-9 Units Subcutaneous TID WC  . linezolid  600 mg Oral Q12H  . pantoprazole  40 mg Oral Daily  . vitamin B-6  50 mg Oral Daily  . risperidone  4 mg Oral  QHS   Continuous Infusions: . sodium chloride 100 mL/hr at 02/20/14 2317   PRN Meds:.acetaminophen, acetaminophen, alum & mag hydroxide-simeth, HYDROmorphone (DILAUDID) injection, ondansetron (ZOFRAN) IV, ondansetron, oxyCODONE    Objective: Weight change:   Intake/Output Summary (Last 24 hours) at 02/21/14 1703 Last data filed at 02/21/14 1400  Gross per 24 hour  Intake   4080 ml  Output   5200 ml  Net  -1120 ml   Blood pressure 154/88, pulse 61, temperature 98.6 F (37 C), temperature source Oral, resp. rate 18, height 6\' 3"  (1.905 m), weight 285 lb (129.275 kg), SpO2 96.00%. Temp:  [98.2 F (36.8 C)-98.7 F (37.1 C)] 98.6 F (37 C) (06/10 1400) Pulse Rate:  [61-66] 61 (06/10 1400) Resp:  [18] 18 (06/10 1400) BP: (147-154)/(88-90) 154/88 mmHg (06/10 1400) SpO2:  [93 %-96 %] 96 % (06/10 1400)  Physical Exam: General: Alert and awake, oriented x3, not in any acute distress.  HEENT: anicteric sclera, pupils reactive to light and accommodation, EOMI, oropharynx clear and without exudate  CVS regular rate, normal r, no murmur rubs or gallops  Chest: clear to auscultation bilaterally, no wheezing, rales or rhonchi  Abdomen: soft nontender, nondistended, normal bowel  sounds,  Extremities:   Right leg  02/19/13     02/20/14:    TODAY 02/21/14:         Right foot 02/19/14:      Films today 02/20/14 albeit with dressing still in place:     Today 02/21/14:     Ankle lesion 02/19/14:      Today 02/21/14      Neuro: nonfocal  CBC:  Recent Labs Lab 02/17/14 2206 02/18/14 0530 02/20/14 0528 02/21/14 0454  HGB 13.4 12.0* 11.5* 12.1*  HCT 37.9* 33.9* 33.5* 35.0*  PLT 267 212 206 206     BMET  Recent Labs  02/20/14 0528 02/21/14 0454  NA 136* 137  K 3.9 4.0  CL 98 99  CO2 28 27  GLUCOSE 113* 114*  BUN 6 6  CREATININE 0.62 0.65  CALCIUM 8.7 8.9     Liver Panel  No results found for this basename: PROT, ALBUMIN, AST, ALT,  ALKPHOS, BILITOT, BILIDIR, IBILI,  in the last 72 hours     Sedimentation Rate No results found for this basename: ESRSEDRATE,  in the last 72 hours C-Reactive Protein No results found for this basename: CRP,  in the last 72 hours  Micro Results: Recent Results (from the past 240 hour(s))  CULTURE, BLOOD (ROUTINE X 2)     Status: None   Collection Time    02/17/14 10:07 PM      Result Value Ref Range Status   Specimen Description BLOOD RIGHT HAND   Final   Special Requests BOTTLES DRAWN AEROBIC AND ANAEROBIC 5CC   Final   Culture  Setup Time     Final   Value: 02/18/2014 01:04     Performed at [facility]   Culture     Final   Value: GRAM POSITIVE COCCI IN CLUSTERS     9 Note: Gram Stain Report Called to,Read Back By and Verified With: PAULINE PEANUSTCHE RN EDMOJ 6 [id number]     Performed at [facility]   Report Status PENDING   Incomplete  CULTURE, BLOOD (ROUTINE X 2)     Status: None   Collection Time    02/17/14 10:34 PM      Result Value Ref Range Status   Specimen Description BLOOD LEFT ARM   Final   Special Requests BOTTLES DRAWN AEROBIC AND ANAEROBIC 5CC   Final   Culture  Setup Time     Final   Value: 02/18/2014 01:04     Performed at [facility]   Culture     Final   Value:        BLOOD CULTURE RECEIVED NO GROWTH TO DATE CULTURE WILL BE HELD FOR 5 DAYS BEFORE ISSUING A FINAL NEGATIVE REPORT     Performed at [facility]   Report Status PENDING   Incomplete    Studies/Results: Ct Angio Low Extrem Right W/cm &/or Wo/cm  02/20/2014   CLINICAL DATA:  Right foot pain and swelling with persistent cellulitis, evaluate for arterial thrombosis  EXAM: CT ANGIOGRAPHY OF THE RIGHT LOWEREXTREMITY  TECHNIQUE: Multidetector CT imaging of the right lowerwas performed using the standard protocol during bolus administration of intravenous contrast. Multiplanar CT image reconstructions and MIPs were obtained to evaluate the vascular anatomy.  CONTRAST:   155mL OMNIPAQUE IOHEXOL 350 MG/ML SOLN  COMPARISON:  Bilateral lower extremity MRI - 02/19/2014  FINDINGS: RIGHT LOWER EXTREMITY VASCULATURE:  Right-sided pelvic vasculature: The imaged distal aspect of the right external  iliac artery is normal without evidence of hemodynamically significant stenosis.  Right common femoral artery: Widely patent without hemodynamically significant stenosis.  Right deep femoral artery: Widely patent without hemodynamically significant stenosis.  Right superficial femoral artery: Widely patent without hemodynamically significant stenosis.  Right popliteal artery: Both the right above and below-knee popliteal arteries are widely patent without hemodynamically significant stenosis.  Right lower leg: There is a three-vessel runoff to the level of the lower leg and foot. The peroneal artery appropriately tapers at the level of the lower leg. The dorsalis pedis artery is patent to the level of the forefoot. There are no discrete filling defects within the distal vascular tree of the right lower extremity to suggest distal embolism.  Review of the MIP images confirms the above findings.   --------------------------------------------------------------------------------  Nonvascular Findings:  There is diffuse soft tissue swelling about the lower leg and foot extending to involve the anterior lateral aspect of the thigh. No defined/drainable fluid collections. No acute or aggressive osseous abnormalities. No discrete areas of osteolysis to suggest osteomyelitis. No subcutaneous emphysema or radiopaque foreign body.  Shotty right inguinal lymph nodes with index right inguinal lymph node measuring 1.1 cm in greatest short axis diameter (image 78, series 6) and index right external iliac chain lymph node measuring approximately 1.3 cm (image 20). Subcutaneous stranding within the caudal aspect of the right abdominal pannus, likely at the site of prior subdermal medication administration.   IMPRESSION: 1. No evidence of hemodynamically significant stenosis or distal embolism affecting the right lower extremity. 2. Diffuse soft tissue swelling about the lower leg and foot suggestive of cellulitis. No defined/drainable fluid collection. 3. Shotty right inguinal and external iliac chain lymph nodes, presumably reactive in etiology.   Electronically Signed   By: Sandi Mariscal M.D.   On: 02/20/2014 13:35   Mr Foot Right W Wo Contrast  02/20/2014   CLINICAL DATA:  Right calf redness and swelling. Hemorrhagic lesions in the toes. Diabetic patient.  EXAM: MRI OF THE RIGHT FOREFOOT WITHOUT AND WITH CONTRAST  TECHNIQUE: Multiplanar, multisequence MR imaging was performed both before and after administration of intravenous contrast.  CONTRAST:  52mL MULTIHANCE GADOBENATE DIMEGLUMINE 529 MG/ML IV SOLN  COMPARISON:  None.  FINDINGS: Freiberg's infraction is present in the second metatarsal head with bone marrow edema and flattening. Diffuse forefoot edema is present which is nonspecific, likely representing cellulitis. This is more prominent over the dorsum of the foot. There are no findings to suggest osteomyelitis. The flexor and extensor tendons are within normal limits. No abscess.  After contrast administration, there is enhancement of the Freiberg's infraction, including soft tissue enhancement around the second metatarsal.  IMPRESSION: Freiberg's infraction of the second metatarsal head.   Electronically Signed   By: Dereck Ligas M.D.   On: 02/20/2014 08:22   Mr Tibia Fibula Right W Wo Contrast  02/20/2014   CLINICAL DATA:  Pain and swelling.  EXAM: MRI OF LOWER RIGHT EXTREMITY WITHOUT AND WITH CONTRAST  TECHNIQUE: Multiplanar, multisequence MR imaging of the lower right extremity was performed both before and after administration of intravenous contrast.  CONTRAST:  20 cc MultiHance  COMPARISON:  02/19/2014  FINDINGS: Diffuse subcutaneous soft tissue swelling/ edema involving the entire image right  lower extremity most consistent with cellulitis. No findings for myofasciitis or osteomyelitis. No rim enhancing fluid collection to suggest a discrete drainable abscess. No findings for septic arthritis involving the knee or the ankle. Minimal areas of signal abnormality in the left soleus muscle  could be due to muscle tear or nonspecific myositis. Muscle infarct is possible also.  IMPRESSION: Diffuse subcutaneous soft tissue swelling/ edema likely representing cellulitis. No focal drainable soft tissue abscess, myofasciitis, septic arthritis or osteomyelitis.   Electronically Signed   By: Kalman Jewels M.D.   On: 02/20/2014 08:45      Assessment/Plan:  Principal Problem:   Cellulitis and abscess of foot Active Problems:   Diabetes mellitus   Bipolar affective disorder   Diabetic ulcer of right foot    James [name] is a 38 y.o. male with history of recurrent cellulitis but now with frank asymmetry tremendous swelling in right calf with pain and hemorrhagic findings in toes.   # 1 Right calf swelling with hemorrhagic findings in toes:   MRI did NOT show myosiits but possible muscle infarct and it did show Freiberg's infraction in 2nd metatarsal  CT did NOT show an arterial or venous pathology  DC zosyn so that if he does well without it we can reason he doesn't need pseudomonal coverage  Continue zyvox and would ask CM to see if they  Can procure him a 2 week supply  Very strange case and I worry that something is "off" here. IF leg swelling does not resolve would repeat MRI in next 7 days   I will swap in zyvox for vancomycin   Would ask Orthopedics for recs re the Freiberg's infraction        LOS: 4 days   Alcide Evener 02/21/2014, 5:03 PM

## 2014-02-21 NOTE — Progress Notes (Signed)
VASCULAR LAB PRELIMINARY  ARTERIAL  ABI completed:  ABIs and great toe pressures are normal.    RIGHT    LEFT    PRESSURE WAVEFORM  PRESSURE WAVEFORM  BRACHIAL 158 Triphasic  BRACHIAL    DP   DP    AT 165 Triphasic  AT 178 Triphasic   PT 168 Triphasic  PT 182 Triphasic   PER   PER    GREAT TOE 135 NA GREAT TOE 138 NA    RIGHT LEFT  ABI 1.06 1.15     Chasyn Cinque, RVT 02/21/2014, 2:48 PM

## 2014-02-21 NOTE — Progress Notes (Signed)
[facility] PROGRESS NOTE  James [name] [id number] DOB: October 30, 1975 DOA: 02/17/2014 PCP: Pcp Not In System  Assessment/Plan:  Principal Problem:   Cellulitis and abscess of foot - Linezolid on board.  Active Problems:   Diabetes mellitus - diabetic diet. - SSI on board.    Bipolar affective disorder - stable, continue current regimen.  Freiberg's infraction  - Discussed with orthopaedic surgeon (per Dr. Sherral Hammers) on call and plan will be to place order for CAM walker or Equalizer and f/u with Dr. Sharol Given.   Code Status: full Family Communication: Discussed directly with patient Disposition Plan: Pending improvement in condition   Consultants:  ID  Procedures:  None  Antibiotics: Linezolid  HPI/Subjective: Patient has no new complaints.  Objective: Filed Vitals:   02/21/14 1400  BP: 154/88  Pulse: 61  Temp: 98.6 F (37 C)  Resp: 18    Intake/Output Summary (Last 24 hours) at 02/21/14 1717 Last data filed at 02/21/14 1714  Gross per 24 hour  Intake   4080 ml  Output   5500 ml  Net  -1420 ml   Filed Weights   02/17/14 2125 02/18/14 0159  Weight: 129.275 kg (285 lb) 129.275 kg (285 lb)    Exam:   General:  Pt in nAD, alert and awake  Cardiovascular: RRR, no MRG  Respiratory: CTA BL, no wheezes  Abdomen: soft, nt, obese  Musculoskeletal: swelling and erythema of RLE when compared to LLE.   Data Reviewed: Basic Metabolic Panel:  Recent Labs Lab 02/17/14 2206 02/18/14 0530 02/20/14 0528 02/21/14 0454  NA 133* 136* 136* 137  K 4.1 3.8 3.9 4.0  CL 94* 96 98 99  CO2 25 26 28 27   GLUCOSE 130* 118* 113* 114*  BUN 17 13 6 6   CREATININE 0.67 0.74 0.62 0.65  CALCIUM 9.3 8.7 8.7 8.9   Liver Function Tests: No results found for this basename: AST, ALT, ALKPHOS, BILITOT, PROT, ALBUMIN,  in the last 168 hours No results found for this basename: LIPASE, AMYLASE,  in the last 168 hours No results found for this basename: AMMONIA,   in the last 168 hours CBC:  Recent Labs Lab 02/17/14 2206 02/18/14 0530 02/20/14 0528 02/21/14 0454  WBC 8.9 6.9 5.5 4.9  NEUTROABS 6.7  --   --   --   HGB 13.4 12.0* 11.5* 12.1*  HCT 37.9* 33.9* 33.5* 35.0*  MCV 90.5 90.9 91.5 91.9  PLT 267 212 206 206   Cardiac Enzymes: No results found for this basename: CKTOTAL, CKMB, CKMBINDEX, TROPONINI,  in the last 168 hours BNP (last 3 results) No results found for this basename: PROBNP,  in the last 8760 hours CBG:  Recent Labs Lab 02/20/14 1636 02/20/14 2123 02/21/14 0751 02/21/14 1147 02/21/14 1610  GLUCAP 118* 145* 113* 106* 110*    Recent Results (from the past 240 hour(s))  CULTURE, BLOOD (ROUTINE X 2)     Status: None   Collection Time    02/17/14 10:07 PM      Result Value Ref Range Status   Specimen Description BLOOD RIGHT HAND   Final   Special Requests BOTTLES DRAWN AEROBIC AND ANAEROBIC 5CC   Final   Culture  Setup Time     Final   Value: 02/18/2014 01:04     Performed at [facility]   Culture     Final   Value: GRAM POSITIVE COCCI IN CLUSTERS     9 Note: Gram Stain Report Called to,Read Back By  and Verified With: PAULINE PEANUSTCHE RN EDMOJ [phone]     Performed at [facility]   Report Status PENDING   Incomplete  CULTURE, BLOOD (ROUTINE X 2)     Status: None   Collection Time    02/17/14 10:34 PM      Result Value Ref Range Status   Specimen Description BLOOD LEFT ARM   Final   Special Requests BOTTLES DRAWN AEROBIC AND ANAEROBIC 5CC   Final   Culture  Setup Time     Final   Value: 02/18/2014 01:04     Performed at [facility]   Culture     Final   Value:        BLOOD CULTURE RECEIVED NO GROWTH TO DATE CULTURE WILL BE HELD FOR 5 DAYS BEFORE ISSUING A FINAL NEGATIVE REPORT     Performed at [facility]   Report Status PENDING   Incomplete     Studies: Ct Angio Low Extrem Right W/cm &/or Wo/cm  02/20/2014   CLINICAL DATA:  Right foot pain and swelling with  persistent cellulitis, evaluate for arterial thrombosis  EXAM: CT ANGIOGRAPHY OF THE RIGHT LOWEREXTREMITY  TECHNIQUE: Multidetector CT imaging of the right lowerwas performed using the standard protocol during bolus administration of intravenous contrast. Multiplanar CT image reconstructions and MIPs were obtained to evaluate the vascular anatomy.  CONTRAST:  178mL OMNIPAQUE IOHEXOL 350 MG/ML SOLN  COMPARISON:  Bilateral lower extremity MRI - 02/19/2014  FINDINGS: RIGHT LOWER EXTREMITY VASCULATURE:  Right-sided pelvic vasculature: The imaged distal aspect of the right external iliac artery is normal without evidence of hemodynamically significant stenosis.  Right common femoral artery: Widely patent without hemodynamically significant stenosis.  Right deep femoral artery: Widely patent without hemodynamically significant stenosis.  Right superficial femoral artery: Widely patent without hemodynamically significant stenosis.  Right popliteal artery: Both the right above and below-knee popliteal arteries are widely patent without hemodynamically significant stenosis.  Right lower leg: There is a three-vessel runoff to the level of the lower leg and foot. The peroneal artery appropriately tapers at the level of the lower leg. The dorsalis pedis artery is patent to the level of the forefoot. There are no discrete filling defects within the distal vascular tree of the right lower extremity to suggest distal embolism.  Review of the MIP images confirms the above findings.   --------------------------------------------------------------------------------  Nonvascular Findings:  There is diffuse soft tissue swelling about the lower leg and foot extending to involve the anterior lateral aspect of the thigh. No defined/drainable fluid collections. No acute or aggressive osseous abnormalities. No discrete areas of osteolysis to suggest osteomyelitis. No subcutaneous emphysema or radiopaque foreign body.  Shotty right inguinal  lymph nodes with index right inguinal lymph node measuring 1.1 cm in greatest short axis diameter (image 78, series 6) and index right external iliac chain lymph node measuring approximately 1.3 cm (image 20). Subcutaneous stranding within the caudal aspect of the right abdominal pannus, likely at the site of prior subdermal medication administration.  IMPRESSION: 1. No evidence of hemodynamically significant stenosis or distal embolism affecting the right lower extremity. 2. Diffuse soft tissue swelling about the lower leg and foot suggestive of cellulitis. No defined/drainable fluid collection. 3. Shotty right inguinal and external iliac chain lymph nodes, presumably reactive in etiology.   Electronically Signed   By: Sandi Mariscal M.D.   On: 02/20/2014 13:35   Mr Foot Right W Wo Contrast  02/20/2014   CLINICAL DATA:  Right  calf redness and swelling. Hemorrhagic lesions in the toes. Diabetic patient.  EXAM: MRI OF THE RIGHT FOREFOOT WITHOUT AND WITH CONTRAST  TECHNIQUE: Multiplanar, multisequence MR imaging was performed both before and after administration of intravenous contrast.  CONTRAST:  1mL MULTIHANCE GADOBENATE DIMEGLUMINE 529 MG/ML IV SOLN  COMPARISON:  None.  FINDINGS: Freiberg's infraction is present in the second metatarsal head with bone marrow edema and flattening. Diffuse forefoot edema is present which is nonspecific, likely representing cellulitis. This is more prominent over the dorsum of the foot. There are no findings to suggest osteomyelitis. The flexor and extensor tendons are within normal limits. No abscess.  After contrast administration, there is enhancement of the Freiberg's infraction, including soft tissue enhancement around the second metatarsal.  IMPRESSION: Freiberg's infraction of the second metatarsal head.   Electronically Signed   By: Dereck Ligas M.D.   On: 02/20/2014 08:22   Mr Tibia Fibula Right W Wo Contrast  02/20/2014   CLINICAL DATA:  Pain and swelling.  EXAM: MRI  OF LOWER RIGHT EXTREMITY WITHOUT AND WITH CONTRAST  TECHNIQUE: Multiplanar, multisequence MR imaging of the lower right extremity was performed both before and after administration of intravenous contrast.  CONTRAST:  20 cc MultiHance  COMPARISON:  02/19/2014  FINDINGS: Diffuse subcutaneous soft tissue swelling/ edema involving the entire image right lower extremity most consistent with cellulitis. No findings for myofasciitis or osteomyelitis. No rim enhancing fluid collection to suggest a discrete drainable abscess. No findings for septic arthritis involving the knee or the ankle. Minimal areas of signal abnormality in the left soleus muscle could be due to muscle tear or nonspecific myositis. Muscle infarct is possible also.  IMPRESSION: Diffuse subcutaneous soft tissue swelling/ edema likely representing cellulitis. No focal drainable soft tissue abscess, myofasciitis, septic arthritis or osteomyelitis.   Electronically Signed   By: Kalman Jewels M.D.   On: 02/20/2014 08:45    Scheduled Meds: . asenapine  5 mg Sublingual BID  . collagenase   Topical Daily  . divalproex  2,000 mg Oral QHS  . enoxaparin (LOVENOX) injection  60 mg Subcutaneous Daily  . FLUoxetine  20 mg Oral Daily  . insulin aspart  0-5 Units Subcutaneous QHS  . insulin aspart  0-9 Units Subcutaneous TID WC  . linezolid  600 mg Oral Q12H  . pantoprazole  40 mg Oral Daily  . vitamin B-6  50 mg Oral Daily  . risperidone  4 mg Oral QHS   Continuous Infusions: . sodium chloride 100 mL/hr at 02/20/14 2317    Time spent: > 35 minutes    Velvet Bathe  [facility] Pager [phone]. If 7PM-7AM, please contact night-coverage at [email or web address], password [facility] 02/21/2014, 5:17 PM  LOS: 4 days

## 2014-02-22 ENCOUNTER — Encounter (HOSPITAL_COMMUNITY): Payer: Self-pay | Admitting: Radiology

## 2014-02-22 ENCOUNTER — Inpatient Hospital Stay (HOSPITAL_COMMUNITY): Payer: Medicare Other

## 2014-02-22 DIAGNOSIS — M7989 Other specified soft tissue disorders: Secondary | ICD-10-CM | POA: Diagnosis not present

## 2014-02-22 DIAGNOSIS — L02419 Cutaneous abscess of limb, unspecified: Secondary | ICD-10-CM | POA: Diagnosis not present

## 2014-02-22 DIAGNOSIS — K4090 Unilateral inguinal hernia, without obstruction or gangrene, not specified as recurrent: Secondary | ICD-10-CM | POA: Diagnosis not present

## 2014-02-22 DIAGNOSIS — M79609 Pain in unspecified limb: Secondary | ICD-10-CM | POA: Diagnosis not present

## 2014-02-22 DIAGNOSIS — M7981 Nontraumatic hematoma of soft tissue: Secondary | ICD-10-CM | POA: Diagnosis not present

## 2014-02-22 DIAGNOSIS — R600 Localized edema: Secondary | ICD-10-CM | POA: Diagnosis present

## 2014-02-22 LAB — GLUCOSE, CAPILLARY
GLUCOSE-CAPILLARY: 100 mg/dL — AB (ref 70–99)
Glucose-Capillary: 112 mg/dL — ABNORMAL HIGH (ref 70–99)
Glucose-Capillary: 151 mg/dL — ABNORMAL HIGH (ref 70–99)
Glucose-Capillary: 138 mg/dL — ABNORMAL HIGH (ref 70–99)

## 2014-02-22 LAB — SEDIMENTATION RATE: SED RATE: 64 mm/h — AB (ref 0–16)

## 2014-02-22 LAB — CULTURE, BLOOD (ROUTINE X 2)

## 2014-02-22 LAB — C-REACTIVE PROTEIN: CRP: 5.3 mg/dL — ABNORMAL HIGH (ref <0.60)

## 2014-02-22 MED ORDER — IOHEXOL 300 MG/ML  SOLN: 25.0000 mL | INTRAMUSCULAR | Status: AC

## 2014-02-22 MED ORDER — IOHEXOL 300 MG/ML  SOLN
125.0000 mL | Freq: Once | INTRAMUSCULAR | Status: AC | PRN
  Administered 2014-02-22: 125 mL via INTRAVENOUS

## 2014-02-22 NOTE — Progress Notes (Addendum)
Geneticist, [facility] at [place] at [phone]. Spoke with Darla. Confirmed that patient's last visit was in February 2015. He has missed several appointments and has been no shows for them. He missed his last June 2nd appointment. Darla states he can still follow up with their office but he really needs to be compliant with appointments. Came to bedside to make patient aware of this but he was sound asleep. Will come back again to discuss conversation with [facility]ie [facility] MD office. Of note, patient has to be engaged with at [facility] Provider in order for [place] [facility] to be able to follow. Right now not sure when patient will discharge, therefore appointment was not made. However, [facility] will make efforts to engage. Of note, patient can follow up with Dr Betty Martinique at [place]. He has been seen by the NP, Waunita Schooner. Will make inpatient RNCM aware of this. Marthenia Rolling, MSN-RN,BSN- [facility] [phone]

## 2014-02-22 NOTE — Progress Notes (Signed)
Came to visit patient at bedside to discuss [facility] services and to confirm PCP. Patient reports he goes to [employer] at [facility]. He endorses he has seen PCP in recent months. Reports he does not go to a specific MD. Asked him to please call to make follow up appointment upon discharge. He states he lives with his daughter and the plan is to return home at discharge. Denies having issues with transportation and housing. However, he states he does not have Medicare Part D for medications and that he pays out of pocket for his medications. His psychiatric meds for bipolar, he reports, are supplied by [facility]. Made inpatient RNCM aware that patient may have an issue with paying for medications upon discharge depending on costs. Consents signed for [facility] services. Will confirm with [facility] that patient does goes there. Patient will receive post hospital discharge call and will be evaluated for monthly home visits. Left [facility] Care Management packet and contact information at bedside.  Marthenia Rolling, MSN- [facility] [phone]

## 2014-02-22 NOTE — Progress Notes (Signed)
[facility] PROGRESS NOTE  James [name] [id number] DOB: 21-Dec-1975 DOA: 02/17/2014 PCP: Pcp Not In System  Assessment/Plan:  Principal Problem:   Cellulitis and abscess of foot - Linezolid on board.  Edema of right lower extremity - ABI's and great toe pressures reported normal - Vascular surgeon consulted and he reports that he reviewed CT angio of the RLE which shows no evidence of arterial occlusive disease.  Other consideration would be obtaining CT of abdomen and pelvis. - I will place order for CT of abdomen and pelvis for further evaluation and recommendations.  Active Problems:   Diabetes mellitus - diabetic diet. - SSI on board.    Bipolar affective disorder - stable, continue current regimen.  Freiberg's infraction  - Discussed with orthopaedic surgeon (per Dr. Sherral Hammers) on call and plan will be to place order for CAM walker or Equalizer and f/u with Dr. Sharol Given.   Code Status: full Family Communication: Discussed directly with patient Disposition Plan: Pending improvement in condition   Consultants:  ID  Procedures:  None  Antibiotics: Linezolid  HPI/Subjective: Patient has no new complaints.  Objective: Filed Vitals:   02/22/14 0627  BP: 145/88  Pulse: 57  Temp: 98.1 F (36.7 C)  Resp: 18    Intake/Output Summary (Last 24 hours) at 02/22/14 1007 Last data filed at 02/22/14 [id number]  Gross per 24 hour  Intake 3243.33 ml  Output   2600 ml  Net 643.33 ml   Filed Weights   02/17/14 2125 02/18/14 0159  Weight: 129.275 kg (285 lb) 129.275 kg (285 lb)    Exam:   General:  Pt in nAD, alert and awake  Cardiovascular: RRR, no MRG  Respiratory: CTA BL, no wheezes  Abdomen: soft, nt, obese  Musculoskeletal: swelling and erythema of RLE when compared to LLE.   Data Reviewed: Basic Metabolic Panel:  Recent Labs Lab 02/17/14 2206 02/18/14 0530 02/20/14 0528 02/21/14 0454  NA 133* 136* 136* 137  K 4.1 3.8 3.9 4.0  CL 94* 96  98 99  CO2 25 26 28 27   GLUCOSE 130* 118* 113* 114*  BUN 17 13 6 6   CREATININE 0.67 0.74 0.62 0.65  CALCIUM 9.3 8.7 8.7 8.9   Liver Function Tests: No results found for this basename: AST, ALT, ALKPHOS, BILITOT, PROT, ALBUMIN,  in the last 168 hours No results found for this basename: LIPASE, AMYLASE,  in the last 168 hours No results found for this basename: AMMONIA,  in the last 168 hours CBC:  Recent Labs Lab 02/17/14 2206 02/18/14 0530 02/20/14 0528 02/21/14 0454  WBC 8.9 6.9 5.5 4.9  NEUTROABS 6.7  --   --   --   HGB 13.4 12.0* 11.5* 12.1*  HCT 37.9* 33.9* 33.5* 35.0*  MCV 90.5 90.9 91.5 91.9  PLT 267 212 206 206   Cardiac Enzymes: No results found for this basename: CKTOTAL, CKMB, CKMBINDEX, TROPONINI,  in the last 168 hours BNP (last 3 results) No results found for this basename: PROBNP,  in the last 8760 hours CBG:  Recent Labs Lab 02/21/14 0751 02/21/14 1147 02/21/14 1610 02/21/14 2311 02/22/14 0736  GLUCAP 113* 106* 110* 107* 100*    Recent Results (from the past 240 hour(s))  CULTURE, BLOOD (ROUTINE X 2)     Status: None   Collection Time    02/17/14 10:07 PM      Result Value Ref Range Status   Specimen Description BLOOD RIGHT HAND   Final   Special Requests BOTTLES DRAWN  AEROBIC AND ANAEROBIC 5CC   Final   Culture  Setup Time     Final   Value: 02/18/2014 01:04     Performed at [facility]   Culture     Final   Value: GRAM POSITIVE COCCI IN CLUSTERS     9 Note: Gram Stain Report Called to,Read Back By and Verified With: PAULINE PEANUSTCHE RN EDMOJ [phone]     Performed at [facility]   Report Status PENDING   Incomplete  CULTURE, BLOOD (ROUTINE X 2)     Status: None   Collection Time    02/17/14 10:34 PM      Result Value Ref Range Status   Specimen Description BLOOD LEFT ARM   Final   Special Requests BOTTLES DRAWN AEROBIC AND ANAEROBIC 5CC   Final   Culture  Setup Time     Final   Value: 02/18/2014 01:04     Performed  at [facility]   Culture     Final   Value:        BLOOD CULTURE RECEIVED NO GROWTH TO DATE CULTURE WILL BE HELD FOR 5 DAYS BEFORE ISSUING A FINAL NEGATIVE REPORT     Performed at [facility]   Report Status PENDING   Incomplete     Studies: Ct Angio Low Extrem Right W/cm &/or Wo/cm  02/20/2014   CLINICAL DATA:  Right foot pain and swelling with persistent cellulitis, evaluate for arterial thrombosis  EXAM: CT ANGIOGRAPHY OF THE RIGHT LOWEREXTREMITY  TECHNIQUE: Multidetector CT imaging of the right lowerwas performed using the standard protocol during bolus administration of intravenous contrast. Multiplanar CT image reconstructions and MIPs were obtained to evaluate the vascular anatomy.  CONTRAST:  130mL OMNIPAQUE IOHEXOL 350 MG/ML SOLN  COMPARISON:  Bilateral lower extremity MRI - 02/19/2014  FINDINGS: RIGHT LOWER EXTREMITY VASCULATURE:  Right-sided pelvic vasculature: The imaged distal aspect of the right external iliac artery is normal without evidence of hemodynamically significant stenosis.  Right common femoral artery: Widely patent without hemodynamically significant stenosis.  Right deep femoral artery: Widely patent without hemodynamically significant stenosis.  Right superficial femoral artery: Widely patent without hemodynamically significant stenosis.  Right popliteal artery: Both the right above and below-knee popliteal arteries are widely patent without hemodynamically significant stenosis.  Right lower leg: There is a three-vessel runoff to the level of the lower leg and foot. The peroneal artery appropriately tapers at the level of the lower leg. The dorsalis pedis artery is patent to the level of the forefoot. There are no discrete filling defects within the distal vascular tree of the right lower extremity to suggest distal embolism.  Review of the MIP images confirms the above findings.   --------------------------------------------------------------------------------   Nonvascular Findings:  There is diffuse soft tissue swelling about the lower leg and foot extending to involve the anterior lateral aspect of the thigh. No defined/drainable fluid collections. No acute or aggressive osseous abnormalities. No discrete areas of osteolysis to suggest osteomyelitis. No subcutaneous emphysema or radiopaque foreign body.  Shotty right inguinal lymph nodes with index right inguinal lymph node measuring 1.1 cm in greatest short axis diameter (image 78, series 6) and index right external iliac chain lymph node measuring approximately 1.3 cm (image 20). Subcutaneous stranding within the caudal aspect of the right abdominal pannus, likely at the site of prior subdermal medication administration.  IMPRESSION: 1. No evidence of hemodynamically significant stenosis or distal embolism affecting the right lower extremity. 2. Diffuse soft  tissue swelling about the lower leg and foot suggestive of cellulitis. No defined/drainable fluid collection. 3. Shotty right inguinal and external iliac chain lymph nodes, presumably reactive in etiology.   Electronically Signed   By: Sandi Mariscal M.D.   On: 02/20/2014 13:35    Scheduled Meds: . asenapine  5 mg Sublingual BID  . collagenase   Topical Daily  . divalproex  2,000 mg Oral QHS  . enoxaparin (LOVENOX) injection  60 mg Subcutaneous Daily  . FLUoxetine  20 mg Oral Daily  . insulin aspart  0-5 Units Subcutaneous QHS  . insulin aspart  0-9 Units Subcutaneous TID WC  . linezolid  600 mg Oral Q12H  . pantoprazole  40 mg Oral Daily  . vitamin B-6  50 mg Oral Daily  . risperidone  4 mg Oral QHS   Continuous Infusions: . sodium chloride 100 mL/hr at 02/22/14 0538    Time spent: > 35 minutes    Velvet Bathe  [facility] Pager [phone]. If 7PM-7AM, please contact night-coverage at [email or web address], password [facility] 02/22/2014, 10:07 AM  LOS: 5 days

## 2014-02-22 NOTE — Progress Notes (Signed)
[name]  Zyvox day # 3 had 4 days of vancomycin as well 5 days of zosyn   Subjective: Thigh pain and swelling going down   Antibiotics:  Anti-infectives   Start     Dose/Rate Route Frequency Ordered Stop   02/20/14 1430  linezolid (ZYVOX) tablet 600 mg     600 mg Oral Every 12 hours 02/20/14 1355     02/20/14 0800  vancomycin (VANCOCIN) 1,250 mg in sodium chloride 0.9 % 250 mL IVPB  Status:  Discontinued     1,250 mg 166.7 mL/hr over 90 Minutes Intravenous Every 8 hours 02/19/14 2300 02/20/14 1355   02/19/14 2315  vancomycin (VANCOCIN) 1,500 mg in sodium chloride 0.9 % 500 mL IVPB     1,500 mg 250 mL/hr over 120 Minutes Intravenous  Once 02/19/14 2300 02/20/14 0138   02/18/14 1200  piperacillin-tazobactam (ZOSYN) IVPB 3.375 g  Status:  Discontinued     3.375 g 12.5 mL/hr over 240 Minutes Intravenous Every 8 hours 02/18/14 1045 02/21/14 1702   02/18/14 1000  vancomycin (VANCOCIN) 1,250 mg in sodium chloride 0.9 % 250 mL IVPB  Status:  Discontinued     1,250 mg 166.7 mL/hr over 90 Minutes Intravenous Every 12 hours 02/17/14 2311 02/19/14 2300   02/18/14 0030  ciprofloxacin (CIPRO) IVPB 400 mg  Status:  Discontinued     400 mg 200 mL/hr over 60 Minutes Intravenous Every 12 hours 02/18/14 0023 02/18/14 1040   02/17/14 2230  vancomycin (VANCOCIN) 2,500 mg in sodium chloride 0.9 % 500 mL IVPB     2,500 mg 250 mL/hr over 120 Minutes Intravenous  Once 02/17/14 2201 02/18/14 0118      Medications: Scheduled Meds: . asenapine  5 mg Sublingual BID  . collagenase   Topical Daily  . divalproex  2,000 mg Oral QHS  . enoxaparin (LOVENOX) injection  60 mg Subcutaneous Daily  . FLUoxetine  20 mg Oral Daily  . insulin aspart  0-5 Units Subcutaneous QHS  . insulin aspart  0-9 Units Subcutaneous TID WC  . iohexol  25 mL Oral Q1 Hr x 2  . linezolid  600 mg Oral Q12H  . pantoprazole  40 mg Oral Daily  . vitamin B-6  50 mg Oral Daily  . risperidone  4 mg  Oral QHS   Continuous Infusions:   PRN Meds:.acetaminophen, acetaminophen, alum & mag hydroxide-simeth, HYDROmorphone (DILAUDID) injection, ondansetron (ZOFRAN) IV, ondansetron, oxyCODONE    Objective: Weight change:   Intake/Output Summary (Last 24 hours) at 02/22/14 1252 Last data filed at 02/22/14 1000  Gross per 24 hour  Intake 3483.33 ml  Output   3400 ml  Net  83.33 ml   Blood pressure 145/88, pulse 57, temperature 98.1 F (36.7 C), temperature source Oral, resp. rate 18, height 6\' 3"  (1.905 m), weight 285 lb (129.275 kg), SpO2 94.00%. Temp:  [98.1 F (36.7 C)-98.6 F (37 C)] 98.1 F (36.7 C) (06/11 0627) Pulse Rate:  [57-63] 57 (06/11 0627) Resp:  [18] 18 (06/11 0627) BP: (145-155)/(88-93) 145/88 mmHg (06/11 0627) SpO2:  [94 %-96 %] 94 % (06/11 0627)  Physical Exam: General: Alert and awake, oriented x3, not in any acute distress.  HEENT: anicteric sclera, pupils reactive to light and accommodation, EOMI, oropharynx clear and without exudate  CVS regular rate, normal r, no murmur rubs or gallops  Chest: clear to auscultation bilaterally, no wheezing, rales or rhonchi  Abdomen: soft nontender, nondistended, normal bowel sounds,  Extremities:  Right leg  02/19/13     02/20/14:     02/21/14:      02/22/14:       Right foot 02/19/14:       02/20/14 albeit with dressing still in place:      02/21/14:    02/22/14:      Ankle lesion 02/19/14:      Today 02/21/14      Neuro: nonfocal  CBC:  Recent Labs Lab 02/17/14 2206 02/18/14 0530 02/20/14 0528 02/21/14 0454  HGB 13.4 12.0* 11.5* 12.1*  HCT 37.9* 33.9* 33.5* 35.0*  PLT 267 212 206 206     BMET  Recent Labs  02/20/14 0528 02/21/14 0454  NA 136* 137  K 3.9 4.0  CL 98 99  CO2 28 27  GLUCOSE 113* 114*  BUN 6 6  CREATININE 0.62 0.65  CALCIUM 8.7 8.9     Liver Panel  No results found for this basename: PROT, ALBUMIN, AST, ALT, ALKPHOS, BILITOT, BILIDIR,  IBILI,  in the last 72 hours     Sedimentation Rate  Recent Labs  02/22/14 0453  ESRSEDRATE 64*   C-Reactive Protein  Recent Labs  02/22/14 0453  CRP 5.3*    Micro Results: Recent Results (from the past 240 hour(s))  CULTURE, BLOOD (ROUTINE X 2)     Status: None   Collection Time    02/17/14 10:07 PM      Result Value Ref Range Status   Specimen Description BLOOD RIGHT HAND   Final   Special Requests BOTTLES DRAWN AEROBIC AND ANAEROBIC 5CC   Final   Culture  Setup Time     Final   Value: 02/18/2014 01:04     Performed at [facility]   Culture     Final   Value: MICROCOCCUS SPECIES     Note: Standardized susceptibility testing for this organism is not available.     9 Note: Gram Stain Report Called to,Read Back By and Verified With: PAULINE PEANUSTCHE RN EDMOJ [id number] [phone] [id number]     Performed at [facility]   Report Status 02/22/2014 FINAL   Final  CULTURE, BLOOD (ROUTINE X 2)     Status: None   Collection Time    02/17/14 10:34 PM      Result Value Ref Range Status   Specimen Description BLOOD LEFT ARM   Final   Special Requests BOTTLES DRAWN AEROBIC AND ANAEROBIC 5CC   Final   Culture  Setup Time     Final   Value: 02/18/2014 01:04     Performed at [facility]   Culture     Final   Value:        BLOOD CULTURE RECEIVED NO GROWTH TO DATE CULTURE WILL BE HELD FOR 5 DAYS BEFORE ISSUING A FINAL NEGATIVE REPORT     Performed at [facility]   Report Status PENDING   Incomplete    Studies/Results: No results found.    Assessment/Plan:  Principal Problem:   Cellulitis and abscess of foot Active Problems:   Diabetes mellitus   Bipolar affective disorder   Diabetic ulcer of right foot   Freiberg's infraction   Edema of right lower extremity    James [name] is a 38 y.o. male with history of recurrent cellulitis but now with frank asymmetry tremendous swelling in right calf with pain and hemorrhagic findings in toes.   # 1  Right calf swelling with hemorrhagic findings in toes:  He is actually improving now. He is going for  CT later today  Provided nothing  New on CT and his renal fxn is stable should be able to go home on oral zyvox  Given he qualifies for the benefit would send him home with 14 day supply of zyvox    #2 Fracture: he is going to get a CAM walker or equalizer  I will arrange HSFU for him in my clinic in the next 10 days      LOS: 5 days   Alcide Evener 02/22/2014, 12:52 PM

## 2014-02-22 NOTE — Care Management Note (Signed)
    Page 1 of 1   02/22/2014     11:31:53 AM CARE MANAGEMENT NOTE 02/22/2014  Patient:  James [name], James [name]   Account Number:  [id number]  Date Initiated:  02/22/2014  Documentation initiated by:  Sunday Spillers  Subjective/Objective Assessment:   38 yo male admitted with LE cellulitis. PTA lived at home with daughter.     Action/Plan:   Home when stable   Anticipated DC Date:  02/22/2014   Anticipated DC Plan:  [place]  CM consult  Medication Assistance      Choice offered to / List presented to:             Status of service:  Completed, signed off Medicare Important Message given?  YES (If response is "NO", the following Medicare IM given date fields will be blank) Date Medicare IM given:  02/22/2014 Date Additional Medicare IM given:    Discharge Disposition:  HOME/SELF CARE  Per UR Regulation:  Reviewed for med. necessity/level of care/duration of stay  If discussed at [place] of Stay Meetings, dates discussed:    Comments:  02-22-14 [place] 1100 Spoke with patient at bedside. Persistent cellulits dispite treatment, attending requested d/c on Zyvox. Contacted patient assistance program for Zyvox and patient qualifies for 100% coverage. Recieved a fax document that needs to accompany patient to pharmacy with the script, patient will have no out of pocket expense. Patient aware, attending made aware, form in [place] outside of patient's room.

## 2014-02-22 NOTE — Progress Notes (Signed)
Came back to bedside to make aware conversation had with [facility] at [facility]. Urged patient to be compliant with appointments and to please follow up accordingly.  Marthenia Rolling, MSN- [facility] [phone]

## 2014-02-23 DIAGNOSIS — L02419 Cutaneous abscess of limb, unspecified: Secondary | ICD-10-CM | POA: Diagnosis not present

## 2014-02-23 LAB — GLUCOSE, CAPILLARY: Glucose-Capillary: 111 mg/dL — ABNORMAL HIGH (ref 70–99)

## 2014-02-23 LAB — MPO/PR-3 (ANCA) ANTIBODIES
Myeloperoxidase Abs: <1
Serine Protease 3: <1

## 2014-02-23 MED ORDER — LINEZOLID 600 MG PO TABS: 600.0000 mg | ORAL_TABLET | Freq: Two times a day (BID) | ORAL | Status: DC

## 2014-02-23 MED ORDER — OXYCODONE HCL 5 MG PO TABS: 5.0000 mg | ORAL_TABLET | ORAL | Status: DC | PRN

## 2014-02-23 NOTE — Progress Notes (Signed)
Patient is alert and oriented, vital signs are stable, discharge instructions reviewed with patient and patient is to told to follow up with ID doctor and orthopedic doctor with 1-2 weeks telephone numbers provide, patient educated regarding dressing changes, prescriptions given and form as well for zyvox supply Neta Mends RN 02-23-2014 9:25pm

## 2014-02-23 NOTE — Discharge Summary (Signed)
Physician Discharge Summary  James [name] [id number] DOB: 17-May-1976 DOA: 02/17/2014  PCP: Martinique, BETTY G, MD  Admit date: 02/17/2014 Discharge date: 02/23/2014  Time spent: > 35 minutes  Recommendations for Outpatient Follow-up:  1. Please be sure to follow up with your ID and orthopeadic doctor post discharge. Orders have been placed for our secretary to help set this up. 2. Our case manager has helped you obtain your antibiotics linezolid. Should you have any trouble please contact our case manager for any further questions.  Discharge Diagnoses:  Please see list under hospital course.  Discharge Condition: stable  Diet recommendation: carb modified diet.  Filed Weights   02/17/14 2125 02/18/14 0159  Weight: 129.275 kg (285 lb) 129.275 kg (285 lb)    History of present illness:  From original HPI: James [name] is a 38 y.o. male with a hx of DM2 who presents to the ED with complaints of worsening redness and swelling of his right Foot. He has ulcers on his right foot for 3 to 4 weeks and was last hospitalized on 05/28 for cellulitis.    Hospital Course:  Principal Problem:   Cellulitis and abscess of foot  - Linezolid for 2 weeks - scheduled f/u with ID on d/c  Edema of right lower extremity  - ABI's and great toe pressures reported normal  - Vascular surgeon consulted and he reports that he reviewed CT angio of the RLE which shows no evidence of arterial occlusive disease. Other consideration would be obtaining CT of abdomen and pelvis.  - CT of abdomen and pelvis per report did not show any vascular reason for RLE edema  Active Problems:   Diabetes mellitus  - diabetic diet.  - pt to continue monitoring blood sugars and follow up with his PCP for further recommendations.  Bipolar affective disorder  - stable, continue current regimen.   Freiberg's infraction  - Discussed with orthopaedic surgeon (per Dr. Sherral Hammers) on call and plan will be to place  order for CAM walker or Equalizer and f/u with Dr. Sharol Given.  - Order placed scheduling for patient  f/u with Dr. Sharol Given   Procedures:  *Please see below  Consultations:  ID  Vascular surgeon  Discharge Exam: Filed Vitals:   02/23/14 0638  BP: 137/84  Pulse: 54  Temp: 97.7 F (36.5 C)  Resp: 18    General: Pt in NAD, alert and awake Cardiovascular: RRR, no MRG Respiratory: CTA BL, no wheezes  Discharge Instructions You were cared for by a hospitalist during your hospital stay. If you have any questions about your discharge medications or the care you received while you were in the hospital after you are discharged, you can call the unit and asked to speak with the hospitalist on call if the hospitalist that took care of you is not available. Once you are discharged, your primary care physician will handle any further medical issues. Please note that NO REFILLS for any discharge medications will be authorized once you are discharged, as it is imperative that you return to your primary care physician (or establish a relationship with a primary care physician if you do not have one) for your aftercare needs so that they can reassess your need for medications and monitor your lab values.  Discharge Instructions   Call MD for:  temperature >100.4    Complete by:  As directed      Diet - low sodium heart healthy    Complete by:  As directed  Discharge instructions    Complete by:  As directed   F/u with ID in 1-2 weeks or sooner should any new concerns arise.     Increase activity slowly    Complete by:  As directed             Medication List         divalproex 500 MG 24 hr tablet  Commonly known as:  DEPAKOTE ER  Take 2,000 mg by mouth at bedtime.     FLUoxetine 40 MG capsule  Commonly known as:  PROZAC  Take 40 mg by mouth daily.     linezolid 600 MG tablet  Commonly known as:  ZYVOX  Take 1 tablet (600 mg total) by mouth every 12 (twelve) hours.     omeprazole  20 MG tablet  Commonly known as:  PRILOSEC OTC  Take 20 mg by mouth daily.     oxyCODONE 5 MG immediate release tablet  Commonly known as:  Oxy IR/ROXICODONE  Take 1 tablet (5 mg total) by mouth every 4 (four) hours as needed for moderate pain.     risperidone 4 MG tablet  Commonly known as:  RISPERDAL  Take 4 mg by mouth at bedtime.     SAPHRIS 5 MG Subl 24 hr tablet  Generic drug:  asenapine  Place 5 mg under the tongue 2 (two) times daily.       Allergies  Allergen Reactions  . Amoxicillin Nausea And Vomiting      The results of significant diagnostics from this hospitalization (including imaging, microbiology, ancillary and laboratory) are listed below for reference.    Significant Diagnostic Studies: Dg Ankle Complete Right  02/04/2014   CLINICAL DATA:  Right ankle swelling and pain.  EXAM: RIGHT ANKLE - COMPLETE 3+ VIEW  COMPARISON:  08/24/2012  FINDINGS: Soft tissue swelling is present.  There is no evidence of fracture, subluxation or dislocation.  No radiographic evidence of osteomyelitis is noted.  No focal bony lesions are present.  IMPRESSION: Soft tissue swelling without acute bony abnormality.   Electronically Signed   By: Hassan Rowan M.D.   On: 02/04/2014 09:23   Ct Angio Low Extrem Right W/cm &/or Wo/cm  02/20/2014   CLINICAL DATA:  Right foot pain and swelling with persistent cellulitis, evaluate for arterial thrombosis  EXAM: CT ANGIOGRAPHY OF THE RIGHT LOWEREXTREMITY  TECHNIQUE: Multidetector CT imaging of the right lowerwas performed using the standard protocol during bolus administration of intravenous contrast. Multiplanar CT image reconstructions and MIPs were obtained to evaluate the vascular anatomy.  CONTRAST:  167mL OMNIPAQUE IOHEXOL 350 MG/ML SOLN  COMPARISON:  Bilateral lower extremity MRI - 02/19/2014  FINDINGS: RIGHT LOWER EXTREMITY VASCULATURE:  Right-sided pelvic vasculature: The imaged distal aspect of the right external iliac artery is normal without  evidence of hemodynamically significant stenosis.  Right common femoral artery: Widely patent without hemodynamically significant stenosis.  Right deep femoral artery: Widely patent without hemodynamically significant stenosis.  Right superficial femoral artery: Widely patent without hemodynamically significant stenosis.  Right popliteal artery: Both the right above and below-knee popliteal arteries are widely patent without hemodynamically significant stenosis.  Right lower leg: There is a three-vessel runoff to the level of the lower leg and foot. The peroneal artery appropriately tapers at the level of the lower leg. The dorsalis pedis artery is patent to the level of the forefoot. There are no discrete filling defects within the distal vascular tree of the right lower extremity to suggest distal embolism.  Review of the MIP images confirms the above findings.   --------------------------------------------------------------------------------  Nonvascular Findings:  There is diffuse soft tissue swelling about the lower leg and foot extending to involve the anterior lateral aspect of the thigh. No defined/drainable fluid collections. No acute or aggressive osseous abnormalities. No discrete areas of osteolysis to suggest osteomyelitis. No subcutaneous emphysema or radiopaque foreign body.  Shotty right inguinal lymph nodes with index right inguinal lymph node measuring 1.1 cm in greatest short axis diameter (image 78, series 6) and index right external iliac chain lymph node measuring approximately 1.3 cm (image 20). Subcutaneous stranding within the caudal aspect of the right abdominal pannus, likely at the site of prior subdermal medication administration.  IMPRESSION: 1. No evidence of hemodynamically significant stenosis or distal embolism affecting the right lower extremity. 2. Diffuse soft tissue swelling about the lower leg and foot suggestive of cellulitis. No defined/drainable fluid collection. 3. Shotty  right inguinal and external iliac chain lymph nodes, presumably reactive in etiology.   Electronically Signed   By: Sandi Mariscal M.D.   On: 02/20/2014 13:35   Ct Abdomen Pelvis W Contrast  02/22/2014   CLINICAL DATA:  RIGHT lower extremity swelling  EXAM: CT ABDOMEN AND PELVIS WITH CONTRAST  TECHNIQUE: Multidetector CT imaging of the abdomen and pelvis was performed using the standard protocol following bolus administration of intravenous contrast. Sagittal and coronal MPR images reconstructed from axial data set.  CONTRAST:  146mL OMNIPAQUE IOHEXOL 300 MG/ML SOLN IV. Dilute oral contrast.  COMPARISON:  10/25/2012  FINDINGS: Tiny pleural effusions bilaterally with minimal dependent atelectasis LEFT lower lobe.  Liver, spleen, pancreas, kidneys, and adrenal glands normal appearance.  LEFT inguinal hernia containing fat.  Stomach and bowel loops normal appearance.  Bladder and ureters normal appearance.  Normal appendix.  No mass, adenopathy, free fluid, or free air.  BILATERAL spondylolysis L5 without spondylolisthesis.  Degenerative disc disease changes L5-S1.  IMPRESSION: Tiny BILATERAL pleural effusions and minimal dependent atelectasis LEFT lower lobe.  LEFT inguinal hernia containing fat.  BILATERAL spondylolysis L5 with degenerative disc disease changes at L5-S1.  No acute intra-abdominal or intrapelvic process identified.   Electronically Signed   By: Lavonia Dana M.D.   On: 02/22/2014 16:19   Ct Foot Right W Contrast  02/07/2014   CLINICAL DATA:  Cellulitis from the right foot extending to the right calf. Swelling and redness. Diabetes.  EXAM: CT OF THE RIGHT FOOT WITH CONTRAST; CT OF THE RIGHT TIBIA AND FIBULA WITH CONTRAST  TECHNIQUE: Multidetector CT imaging was performed following the standard protocol during bolus administration of intravenous contrast.  CONTRAST:  10mL OMNIPAQUE IOHEXOL 300 MG/ML  SOLN  COMPARISON:  None.  FINDINGS: There is subcutaneous edema extending from the foot proximally to  the level of the knee. There is no soft tissue abscess. The anterior and posterior muscular compartments are within normal limits. Negative for osteomyelitis. Tibia and fibula appear normal. No ankle effusion is present. Accessory ossicles are present in the foot, incidentally noted including at the anterior process of the calcaneus. No ulcerations are identified. Calcaneal spurs are present. There is no radiopaque foreign body.  On the scout images, the contralateral leg is partially visualized and shows hardware compatible with an old ORIF. Neurovascular bundles appear within normal limits.  IMPRESSION: Cellulitis of the right foot and leg. No soft tissue abscess and no osteomyelitis.   Electronically Signed   By: Dereck Ligas M.D.   On: 02/07/2014 12:27   Ct Tibia Fibula Right  W Contrast  02/07/2014   CLINICAL DATA:  Cellulitis from the right foot extending to the right calf. Swelling and redness. Diabetes.  EXAM: CT OF THE RIGHT FOOT WITH CONTRAST; CT OF THE RIGHT TIBIA AND FIBULA WITH CONTRAST  TECHNIQUE: Multidetector CT imaging was performed following the standard protocol during bolus administration of intravenous contrast.  CONTRAST:  37mL OMNIPAQUE IOHEXOL 300 MG/ML  SOLN  COMPARISON:  None.  FINDINGS: There is subcutaneous edema extending from the foot proximally to the level of the knee. There is no soft tissue abscess. The anterior and posterior muscular compartments are within normal limits. Negative for osteomyelitis. Tibia and fibula appear normal. No ankle effusion is present. Accessory ossicles are present in the foot, incidentally noted including at the anterior process of the calcaneus. No ulcerations are identified. Calcaneal spurs are present. There is no radiopaque foreign body.  On the scout images, the contralateral leg is partially visualized and shows hardware compatible with an old ORIF. Neurovascular bundles appear within normal limits.  IMPRESSION: Cellulitis of the right foot and  leg. No soft tissue abscess and no osteomyelitis.   Electronically Signed   By: Dereck Ligas M.D.   On: 02/07/2014 12:27   Mr Foot Right W Wo Contrast  02/20/2014   CLINICAL DATA:  Right calf redness and swelling. Hemorrhagic lesions in the toes. Diabetic patient.  EXAM: MRI OF THE RIGHT FOREFOOT WITHOUT AND WITH CONTRAST  TECHNIQUE: Multiplanar, multisequence MR imaging was performed both before and after administration of intravenous contrast.  CONTRAST:  34mL MULTIHANCE GADOBENATE DIMEGLUMINE 529 MG/ML IV SOLN  COMPARISON:  None.  FINDINGS: Freiberg's infraction is present in the second metatarsal head with bone marrow edema and flattening. Diffuse forefoot edema is present which is nonspecific, likely representing cellulitis. This is more prominent over the dorsum of the foot. There are no findings to suggest osteomyelitis. The flexor and extensor tendons are within normal limits. No abscess.  After contrast administration, there is enhancement of the Freiberg's infraction, including soft tissue enhancement around the second metatarsal.  IMPRESSION: Freiberg's infraction of the second metatarsal head.   Electronically Signed   By: Dereck Ligas M.D.   On: 02/20/2014 08:22   Mr Tibia Fibula Right W Wo Contrast  02/20/2014   CLINICAL DATA:  Pain and swelling.  EXAM: MRI OF LOWER RIGHT EXTREMITY WITHOUT AND WITH CONTRAST  TECHNIQUE: Multiplanar, multisequence MR imaging of the lower right extremity was performed both before and after administration of intravenous contrast.  CONTRAST:  20 cc MultiHance  COMPARISON:  02/19/2014  FINDINGS: Diffuse subcutaneous soft tissue swelling/ edema involving the entire image right lower extremity most consistent with cellulitis. No findings for myofasciitis or osteomyelitis. No rim enhancing fluid collection to suggest a discrete drainable abscess. No findings for septic arthritis involving the knee or the ankle. Minimal areas of signal abnormality in the left soleus  muscle could be due to muscle tear or nonspecific myositis. Muscle infarct is possible also.  IMPRESSION: Diffuse subcutaneous soft tissue swelling/ edema likely representing cellulitis. No focal drainable soft tissue abscess, myofasciitis, septic arthritis or osteomyelitis.   Electronically Signed   By: Kalman Jewels M.D.   On: 02/20/2014 08:45   Dg Foot Complete Right  02/17/2014   CLINICAL DATA:  Cellulitis.  EXAM: RIGHT FOOT COMPLETE - 3+ VIEW  COMPARISON:  Right foot CT Feb 07, 2014.  FINDINGS: No acute fracture deformity or dislocation. Remote fracture of the distal aspect of the fifth proximal phalanx with apparent nonunion,  unchanged. Diffuse soft tissue swelling without subcutaneous gas or radiopaque foreign bodies. Mild first metatarsophalangeal osteoarthrosis. Type 2 navicular bone.  IMPRESSION: Soft tissue swelling consistent with history of cellulitis, without subcutaneous gas. No radiographic findings of osteomyelitis.   Electronically Signed   By: Elon Alas   On: 02/17/2014 23:12    Microbiology: Recent Results (from the past 240 hour(s))  CULTURE, BLOOD (ROUTINE X 2)     Status: None   Collection Time    02/17/14 10:07 PM      Result Value Ref Range Status   Specimen Description BLOOD RIGHT HAND   Final   Special Requests BOTTLES DRAWN AEROBIC AND ANAEROBIC 5CC   Final   Culture  Setup Time     Final   Value: 02/18/2014 01:04     Performed at [facility]   Culture     Final   Value: MICROCOCCUS SPECIES     Note: Standardized susceptibility testing for this organism is not available.     9 Note: Gram Stain Report Called to,Read Back By and Verified With: PAULINE PEANUSTCHE RN EDMOJ [phone] [id number]     Performed at [facility]   Report Status 02/22/2014 FINAL   Final  CULTURE, BLOOD (ROUTINE X 2)     Status: None   Collection Time    02/17/14 10:34 PM      Result Value Ref Range Status   Specimen Description BLOOD LEFT ARM   Final   Special Requests  BOTTLES DRAWN AEROBIC AND ANAEROBIC 5CC   Final   Culture  Setup Time     Final   Value: 02/18/2014 01:04     Performed at [facility]   Culture     Final   Value:        BLOOD CULTURE RECEIVED NO GROWTH TO DATE CULTURE WILL BE HELD FOR 5 DAYS BEFORE ISSUING A FINAL NEGATIVE REPORT     Performed at [facility]   Report Status PENDING   Incomplete     Labs: Basic Metabolic Panel:  Recent Labs Lab 02/17/14 2206 02/18/14 0530 02/20/14 0528 02/21/14 0454  NA 133* 136* 136* 137  K 4.1 3.8 3.9 4.0  CL 94* 96 98 99  CO2 25 26 28 27   GLUCOSE 130* 118* 113* 114*  BUN 17 13 6 6   CREATININE 0.67 0.74 0.62 0.65  CALCIUM 9.3 8.7 8.7 8.9   Liver Function Tests: No results found for this basename: AST, ALT, ALKPHOS, BILITOT, PROT, ALBUMIN,  in the last 168 hours No results found for this basename: LIPASE, AMYLASE,  in the last 168 hours No results found for this basename: AMMONIA,  in the last 168 hours CBC:  Recent Labs Lab 02/17/14 2206 02/18/14 0530 02/20/14 0528 02/21/14 0454  WBC 8.9 6.9 5.5 4.9  NEUTROABS 6.7  --   --   --   HGB 13.4 12.0* 11.5* 12.1*  HCT 37.9* 33.9* 33.5* 35.0*  MCV 90.5 90.9 91.5 91.9  PLT 267 212 206 206   Cardiac Enzymes: No results found for this basename: CKTOTAL, CKMB, CKMBINDEX, TROPONINI,  in the last 168 hours BNP: BNP (last 3 results) No results found for this basename: PROBNP,  in the last 8760 hours CBG:  Recent Labs Lab 02/22/14 0736 02/22/14 1219 02/22/14 1623 02/22/14 2131 02/23/14 0759  GLUCAP 100* 112* 151* 138* 111*       Signed:  Velvet Bathe  [facility] 02/23/2014, 8:55 AM

## 2014-02-24 LAB — CULTURE, BLOOD (ROUTINE X 2): CULTURE: NO GROWTH

## 2014-03-13 ENCOUNTER — Inpatient Hospital Stay: Payer: Medicare Other | Admitting: Internal Medicine

## 2014-05-09 ENCOUNTER — Encounter (HOSPITAL_COMMUNITY): Payer: Self-pay | Admitting: Emergency Medicine

## 2014-05-09 ENCOUNTER — Emergency Department (HOSPITAL_COMMUNITY)
Admission: EM | Admit: 2014-05-09 | Discharge: 2014-05-09 | Disposition: A | Discharge status: Home / Self Care | Payer: Medicare Other | Source: Home / Self Care | Attending: Emergency Medicine | Admitting: Emergency Medicine

## 2014-05-09 ENCOUNTER — Emergency Department (HOSPITAL_COMMUNITY): Payer: Medicare Other

## 2014-05-09 DIAGNOSIS — Z79899 Other long term (current) drug therapy: Secondary | ICD-10-CM | POA: Insufficient documentation

## 2014-05-09 DIAGNOSIS — R1012 Left upper quadrant pain: Secondary | ICD-10-CM | POA: Insufficient documentation

## 2014-05-09 DIAGNOSIS — E669 Obesity, unspecified: Secondary | ICD-10-CM | POA: Insufficient documentation

## 2014-05-09 DIAGNOSIS — R1013 Epigastric pain: Secondary | ICD-10-CM | POA: Diagnosis not present

## 2014-05-09 DIAGNOSIS — R1033 Periumbilical pain: Secondary | ICD-10-CM | POA: Insufficient documentation

## 2014-05-09 DIAGNOSIS — E119 Type 2 diabetes mellitus without complications: Secondary | ICD-10-CM | POA: Insufficient documentation

## 2014-05-09 DIAGNOSIS — R079 Chest pain, unspecified: Secondary | ICD-10-CM | POA: Insufficient documentation

## 2014-05-09 DIAGNOSIS — J90 Pleural effusion, not elsewhere classified: Secondary | ICD-10-CM | POA: Diagnosis not present

## 2014-05-09 DIAGNOSIS — K859 Acute pancreatitis without necrosis or infection, unspecified: Secondary | ICD-10-CM | POA: Diagnosis not present

## 2014-05-09 DIAGNOSIS — F17200 Nicotine dependence, unspecified, uncomplicated: Secondary | ICD-10-CM

## 2014-05-09 DIAGNOSIS — R1011 Right upper quadrant pain: Secondary | ICD-10-CM

## 2014-05-09 DIAGNOSIS — F319 Bipolar disorder, unspecified: Secondary | ICD-10-CM

## 2014-05-09 DIAGNOSIS — M549 Dorsalgia, unspecified: Secondary | ICD-10-CM

## 2014-05-09 DIAGNOSIS — R1010 Upper abdominal pain, unspecified: Secondary | ICD-10-CM

## 2014-05-09 DIAGNOSIS — Z872 Personal history of diseases of the skin and subcutaneous tissue: Secondary | ICD-10-CM

## 2014-05-09 LAB — CBC WITH DIFFERENTIAL/PLATELET
Basophils Absolute: 0 10*3/uL (ref 0.0–0.1)
Basophils Relative: 0 % (ref 0–1)
EOS ABS: 0.1 10*3/uL (ref 0.0–0.7)
Eosinophils Relative: 1 % (ref 0–5)
HEMATOCRIT: 42.2 % (ref 39.0–52.0)
HEMOGLOBIN: 15.1 g/dL (ref 13.0–17.0)
Lymphocytes Relative: 14 % (ref 12–46)
Lymphs Abs: 1.2 10*3/uL (ref 0.7–4.0)
MCH: 31.3 pg (ref 26.0–34.0)
MCHC: 35.8 g/dL (ref 30.0–36.0)
MCV: 87.6 fL (ref 78.0–100.0)
MONOS PCT: 7 % (ref 3–12)
Monocytes Absolute: 0.6 10*3/uL (ref 0.1–1.0)
NEUTROS ABS: 6.6 10*3/uL (ref 1.7–7.7)
Neutrophils Relative %: 78 % — ABNORMAL HIGH (ref 43–77)
Platelets: 195 10*3/uL (ref 150–400)
RBC: 4.82 MIL/uL (ref 4.22–5.81)
RDW: 13.8 % (ref 11.5–15.5)
WBC: 8.5 10*3/uL (ref 4.0–10.5)

## 2014-05-09 LAB — URINALYSIS, ROUTINE W REFLEX MICROSCOPIC
BILIRUBIN URINE: NEGATIVE
HGB URINE DIPSTICK: NEGATIVE
Ketones, ur: 15 mg/dL — AB
Leukocytes, UA: NEGATIVE
Nitrite: NEGATIVE
Protein, ur: 100 mg/dL — AB
Specific Gravity, Urine: 1.031 — ABNORMAL HIGH (ref 1.005–1.030)
UROBILINOGEN UA: 0.2 mg/dL (ref 0.0–1.0)
pH: 7.5 (ref 5.0–8.0)

## 2014-05-09 LAB — COMPREHENSIVE METABOLIC PANEL
ALK PHOS: 52 U/L (ref 39–117)
ALT: 50 U/L (ref 0–53)
AST: 80 U/L — ABNORMAL HIGH (ref 0–37)
Albumin: 3.6 g/dL (ref 3.5–5.2)
Anion gap: 18 — ABNORMAL HIGH (ref 5–15)
BILIRUBIN TOTAL: 0.3 mg/dL (ref 0.3–1.2)
BUN: 9 mg/dL (ref 6–23)
CHLORIDE: 96 meq/L (ref 96–112)
CO2: 22 meq/L (ref 19–32)
Calcium: 8.9 mg/dL (ref 8.4–10.5)
Creatinine, Ser: 0.54 mg/dL (ref 0.50–1.35)
GLUCOSE: 184 mg/dL — AB (ref 70–99)
POTASSIUM: 5.7 meq/L — AB (ref 3.7–5.3)
Sodium: 136 mEq/L — ABNORMAL LOW (ref 137–147)
Total Protein: 7.8 g/dL (ref 6.0–8.3)

## 2014-05-09 LAB — I-STAT TROPONIN, ED: Troponin i, poc: 0 ng/mL (ref 0.00–0.08)

## 2014-05-09 LAB — LIPASE, BLOOD: Lipase: 163 U/L — ABNORMAL HIGH (ref 11–59)

## 2014-05-09 LAB — URINE MICROSCOPIC-ADD ON

## 2014-05-09 LAB — BILIRUBIN, DIRECT

## 2014-05-09 MED ORDER — MORPHINE SULFATE 4 MG/ML IJ SOLN
4.0000 mg | Freq: Once | INTRAMUSCULAR | Status: AC
  Administered 2014-05-09: 4 mg via INTRAVENOUS
  Filled 2014-05-09: qty 1

## 2014-05-09 MED ORDER — HYDROCODONE-ACETAMINOPHEN 5-325 MG PO TABS: 1.0000 | ORAL_TABLET | ORAL | Status: DC | PRN

## 2014-05-09 MED ORDER — SODIUM CHLORIDE 0.9 % IV BOLUS (SEPSIS)
1000.0000 mL | Freq: Once | INTRAVENOUS | Status: AC
  Administered 2014-05-09: 1000 mL via INTRAVENOUS

## 2014-05-09 MED ORDER — GI COCKTAIL ~~LOC~~
30.0000 mL | Freq: Once | ORAL | Status: AC
  Administered 2014-05-09: 30 mL via ORAL
  Filled 2014-05-09: qty 30

## 2014-05-09 NOTE — ED Provider Notes (Signed)
CSN: [id number]     Arrival date & time 05/09/14  1136 History   First MD Initiated Contact with Patient 05/09/14 1137     Chief Complaint  Patient presents with  . Chest Pain     (Consider location/radiation/quality/duration/timing/severity/associated sxs/prior Treatment) HPI 38 year old male presents with 5 hours of epigastric abdominal pain. He states it is all above his umbilicus. He describes is a cramping type pain. His never had pain like this before. Occasionally radiates into his chest. Denies trouble breathing, nausea, vomiting. He states he had a large bowel movement this morning but was not diarrhea. Is not really a last night but states is not out of the ordinary. Has not eaten anything today. Rates the pain as a 5/10. The pain seems to radiate around to the back.  Past Medical History  Diagnosis Date  . Bipolar 1 disorder   . Cellulitis 08/24/2012    "RLE and spot on my right forearm" (08/24/2012)  . Type II diabetes mellitus ~ 2009    "take Metformin" (08/24/2012)   Past Surgical History  Procedure Laterality Date  . Open anterior shoulder reconstruction  ~ 2003    "right; kept dislocating; cut it open; sewed muscle back together; moved cartilage around and stapled that" (08/24/2012)  . Foot fracture surgery      "left; scaffold broke" 08/24/2012)  . Eye surgery     Family History  Problem Relation Age of Onset  . Diabetic kidney disease Maternal Uncle    History  Substance Use Topics  . Smoking status: Current Every Day Smoker -- 1.00 packs/day for 25 years    Types: Cigarettes  . Smokeless tobacco: Never Used     Comment: 08/24/2012 "stopped both snuff and chew ~ 10 yr ago"  . Alcohol Use: 0.0 oz/week    Review of Systems  Constitutional: Negative for fever.  Respiratory: Negative for shortness of breath.   Cardiovascular: Negative for chest pain.  Gastrointestinal: Positive for abdominal pain. Negative for nausea, vomiting and diarrhea.  Genitourinary:  Negative for dysuria.  Musculoskeletal: Positive for back pain.  All other systems reviewed and are negative.     Allergies  Review of patient's allergies indicates no known allergies.  Home Medications   Prior to Admission medications   Medication Sig Start Date End Date Taking? Authorizing Provider  asenapine (SAPHRIS) 5 MG SUBL Place 10 mg under the tongue 2 (two) times daily.    Yes Historical Provider, MD  divalproex (DEPAKOTE ER) 500 MG 24 hr tablet Take 2,000 mg by mouth at bedtime.   Yes Historical Provider, MD  FLUoxetine (PROZAC) 20 MG capsule Take 40 mg by mouth daily.   Yes Historical Provider, MD  omeprazole (PRILOSEC OTC) 20 MG tablet Take 20 mg by mouth daily.   Yes Historical Provider, MD  risperidone (RISPERDAL) 4 MG tablet Take 4 mg by mouth at bedtime.    Yes Historical Provider, MD   BP 154/98  Temp(Src) 98.3 F (36.8 C) (Oral)  Resp 12  SpO2 97% Physical Exam  Nursing note and vitals reviewed. Constitutional: He is oriented to person, place, and time. He appears well-developed and well-nourished.  obese  HENT:  Head: Normocephalic and atraumatic.  Right Ear: External ear normal.  Left Ear: External ear normal.  Nose: Nose normal.  Eyes: Right eye exhibits no discharge. Left eye exhibits no discharge.  Neck: Neck supple.  Cardiovascular: Normal rate, regular rhythm, normal heart sounds and intact distal pulses.   Pulmonary/Chest: Effort normal and  breath sounds normal.  Abdominal: Soft. There is tenderness in the right upper quadrant, epigastric area, periumbilical area and left upper quadrant. There is no rigidity and no guarding.  Musculoskeletal: He exhibits no edema.  Neurological: He is alert and oriented to person, place, and time.  Skin: Skin is warm and dry.    ED Course  Procedures (including critical care time) Labs Review Labs Reviewed  CBC WITH DIFFERENTIAL - Abnormal; Notable for the following:    Neutrophils Relative % 78 (*)    All  other components within normal limits  COMPREHENSIVE METABOLIC PANEL - Abnormal; Notable for the following:    Sodium 136 (*)    Potassium 5.7 (*)    Glucose, Bld 184 (*)    AST 80 (*)    Anion gap 18 (*)    All other components within normal limits  LIPASE, BLOOD - Abnormal; Notable for the following:    Lipase 163 (*)    All other components within normal limits  URINALYSIS, ROUTINE W REFLEX MICROSCOPIC - Abnormal; Notable for the following:    Color, Urine AMBER (*)    Specific Gravity, Urine 1.031 (*)    Glucose, UA >1000 (*)    Ketones, ur 15 (*)    Protein, ur 100 (*)    All other components within normal limits  URINE MICROSCOPIC-ADD ON  BILIRUBIN, DIRECT  I-STAT TROPOININ, ED    Imaging Review Dg Chest 2 View  05/09/2014   CLINICAL DATA:  Chest pain  EXAM: CHEST  2 VIEW  COMPARISON:  None.  FINDINGS: The lungs are borderline hypoinflated but clear. The heart and pulmonary vascularity are normal. There is no pleural effusion, pneumothorax, or pneumomediastinum. The bony thorax is unremarkable.  IMPRESSION: There is no acute cardiopulmonary abnormality.   Electronically Signed   By: David  Martinique   On: 05/09/2014 12:42     EKG Interpretation   Date/Time:  Wednesday May 09 2014 11:43:17 EDT Ventricular Rate:  90 PR Interval:  140 QRS Duration: 99 QT Interval:  364 QTC Calculation: 445 R Axis:   86 Text Interpretation:  Sinus rhythm Probable left atrial enlargement RSR'  in V1 or V2, right VCD or RVH Borderline T abnormalities, lateral leads No  significant change since June 2015 Confirmed by Regenia Skeeter  MD, Murle Hellstrom ([id number])  on 05/09/2014 11:48:18 AM      MDM   Final diagnoses:  Pain of upper abdomen    Patient's chief complaint says chest pain but is really upper abdominal pain. Lab work is unremarkable except for a mild lipase elevation of 163. No elevated alkaline phosphatase or bilirubin to suggest a gallstone pancreatitis. No focal right upper quadrant  tenderness, he is mostly tenderness left upper quadrant. He feels somewhat improved in the ED. He's not having any nausea or vomiting. Do not feel that his abdomen is tenderness toward the CT scan and have low suspicion for a pancreatic pseudocyst or other acute pathology requiring intervention. At this time we'll treat with oral pain control and have him followup with his PCP. I discussed strict return precautions, including worse pain, vomiting, fevers, or other concerning symptoms.    Ephraim Hamburger, MD 05/09/14 1447

## 2014-05-09 NOTE — Discharge Instructions (Signed)
Abdominal (belly) pain can be caused by many things. Your caregiver performed an examination and possibly ordered blood/urine tests and imaging (CT scan, x-rays, ultrasound). Many cases can be observed and treated at home after initial evaluation in the emergency department. Even though you are being discharged home, abdominal pain can be unpredictable. Therefore, you need a repeated exam if your pain does not resolve, returns, or worsens. Most patients with abdominal pain don't have to be admitted to the hospital or have surgery, but serious problems like appendicitis and gallbladder attacks can start out as nonspecific pain. Many abdominal conditions cannot be diagnosed in one visit, so follow-up evaluations are very important. °SEEK IMMEDIATE MEDICAL ATTENTION IF: °The pain does not go away or becomes severe.  °A temperature above 101 develops.  °Repeated vomiting occurs (multiple episodes).  °The pain becomes localized to portions of the abdomen. The right side could possibly be appendicitis. In an adult, the left lower portion of the abdomen could be colitis or diverticulitis.  °Blood is being passed in stools or vomit (bright red or black tarry stools).  °Return also if you develop chest pain, difficulty breathing, dizziness or fainting, or become confused, poorly responsive, or inconsolable (young children). ° ° °Abdominal Pain °Many things can cause abdominal pain. Usually, abdominal pain is not caused by a disease and will improve without treatment. It can often be observed and treated at home. Your health care provider will do a physical exam and possibly order blood tests and X-rays to help determine the seriousness of your pain. However, in many cases, more time must pass before a clear cause of the pain can be found. Before that point, your health care provider may not know if you need more testing or further treatment. °HOME CARE INSTRUCTIONS  °Monitor your abdominal pain for any changes. The following  actions may help to alleviate any discomfort you are experiencing: °· Only take over-the-counter or prescription medicines as directed by your health care provider. °· Do not take laxatives unless directed to do so by your health care provider. °· Try a clear liquid diet (broth, tea, or water) as directed by your health care provider. Slowly move to a bland diet as tolerated. °SEEK MEDICAL CARE IF: °· You have unexplained abdominal pain. °· You have abdominal pain associated with nausea or diarrhea. °· You have pain when you urinate or have a bowel movement. °· You experience abdominal pain that wakes you in the night. °· You have abdominal pain that is worsened or improved by eating food. °· You have abdominal pain that is worsened with eating fatty foods. °· You have a fever. °SEEK IMMEDIATE MEDICAL CARE IF:  °· Your pain does not go away within 2 hours. °· You keep throwing up (vomiting). °· Your pain is felt only in portions of the abdomen, such as the right side or the left lower portion of the abdomen. °· You pass bloody or black tarry stools. °MAKE SURE YOU: °· Understand these instructions.   °· Will watch your condition.   °· Will get help right away if you are not doing well or get worse.   °Document Released: 06/10/2005 Document Revised: 09/05/2013 Document Reviewed: 05/10/2013 °[employer]® Patient Information ©2015 [employer]. This information is not intended to replace advice given to you by your health care provider. Make sure you discuss any questions you have with your health care provider. ° °

## 2014-05-09 NOTE — ED Notes (Signed)
Per EMS: Pt from home with c/o central chest "cramping" radiating to abdomen. Denies SOB, N/V, diaphoresis. Pain worse with palpation. Pt in NAD. 85 NSR, unremarkable EKG. 159/105. 94% RA.

## 2014-05-09 NOTE — ED Notes (Addendum)
Cbc  Sample is hemolyzed. Sample will need to be recollected. ED MD made aware of delay

## 2014-05-09 NOTE — ED Notes (Signed)
Contacted lab x 2 about wait time on lab results.

## 2014-05-09 NOTE — ED Notes (Signed)
Called bmp, lipase and hepatic function panel has hemolyzed and need to be recollected. ED MD made aware

## 2014-05-09 NOTE — ED Notes (Signed)
Patient transported to X-ray 

## 2014-05-11 ENCOUNTER — Emergency Department (HOSPITAL_COMMUNITY): Payer: Medicare Other

## 2014-05-11 ENCOUNTER — Encounter (HOSPITAL_COMMUNITY): Payer: Self-pay | Admitting: Emergency Medicine

## 2014-05-11 ENCOUNTER — Inpatient Hospital Stay (HOSPITAL_COMMUNITY)
Admission: EM | Admit: 2014-05-11 | Discharge: 2014-05-16 | DRG: 439 | Disposition: A | Discharge status: Home / Self Care | Payer: Medicare Other | Attending: Internal Medicine | Admitting: Internal Medicine

## 2014-05-11 DIAGNOSIS — D62 Acute posthemorrhagic anemia: Secondary | ICD-10-CM | POA: Diagnosis not present

## 2014-05-11 DIAGNOSIS — F319 Bipolar disorder, unspecified: Secondary | ICD-10-CM | POA: Diagnosis present

## 2014-05-11 DIAGNOSIS — E875 Hyperkalemia: Secondary | ICD-10-CM | POA: Diagnosis not present

## 2014-05-11 DIAGNOSIS — Z79899 Other long term (current) drug therapy: Secondary | ICD-10-CM

## 2014-05-11 DIAGNOSIS — F17200 Nicotine dependence, unspecified, uncomplicated: Secondary | ICD-10-CM | POA: Diagnosis present

## 2014-05-11 DIAGNOSIS — R1012 Left upper quadrant pain: Secondary | ICD-10-CM | POA: Diagnosis not present

## 2014-05-11 DIAGNOSIS — L02619 Cutaneous abscess of unspecified foot: Secondary | ICD-10-CM

## 2014-05-11 DIAGNOSIS — R600 Localized edema: Secondary | ICD-10-CM

## 2014-05-11 DIAGNOSIS — D638 Anemia in other chronic diseases classified elsewhere: Secondary | ICD-10-CM

## 2014-05-11 DIAGNOSIS — E871 Hypo-osmolality and hyponatremia: Secondary | ICD-10-CM | POA: Diagnosis present

## 2014-05-11 DIAGNOSIS — R1011 Right upper quadrant pain: Secondary | ICD-10-CM | POA: Diagnosis not present

## 2014-05-11 DIAGNOSIS — E876 Hypokalemia: Secondary | ICD-10-CM | POA: Diagnosis not present

## 2014-05-11 DIAGNOSIS — K859 Acute pancreatitis without necrosis or infection, unspecified: Principal | ICD-10-CM | POA: Diagnosis present

## 2014-05-11 DIAGNOSIS — J90 Pleural effusion, not elsewhere classified: Secondary | ICD-10-CM | POA: Diagnosis not present

## 2014-05-11 DIAGNOSIS — R109 Unspecified abdominal pain: Secondary | ICD-10-CM | POA: Diagnosis not present

## 2014-05-11 DIAGNOSIS — R1013 Epigastric pain: Secondary | ICD-10-CM | POA: Diagnosis not present

## 2014-05-11 DIAGNOSIS — L03119 Cellulitis of unspecified part of limb: Secondary | ICD-10-CM

## 2014-05-11 DIAGNOSIS — E119 Type 2 diabetes mellitus without complications: Secondary | ICD-10-CM | POA: Diagnosis present

## 2014-05-11 DIAGNOSIS — L03115 Cellulitis of right lower limb: Secondary | ICD-10-CM

## 2014-05-11 LAB — URINALYSIS, ROUTINE W REFLEX MICROSCOPIC
Glucose, UA: >1000 mg/dL — AB
Hgb urine dipstick: NEGATIVE
KETONES UR: 15 mg/dL — AB
LEUKOCYTES UA: NEGATIVE
Nitrite: NEGATIVE
PROTEIN: 100 mg/dL — AB
Specific Gravity, Urine: 1.037 — ABNORMAL HIGH (ref 1.005–1.030)
UROBILINOGEN UA: 1 mg/dL (ref 0.0–1.0)
pH: 6.5 (ref 5.0–8.0)

## 2014-05-11 LAB — CBC WITH DIFFERENTIAL/PLATELET
BASOS PCT: 0 % (ref 0–1)
Basophils Absolute: 0 10*3/uL (ref 0.0–0.1)
EOS PCT: 1 % (ref 0–5)
Eosinophils Absolute: 0.1 10*3/uL (ref 0.0–0.7)
HEMATOCRIT: 38.4 % — AB (ref 39.0–52.0)
Hemoglobin: 13.3 g/dL (ref 13.0–17.0)
Lymphocytes Relative: 9 % — ABNORMAL LOW (ref 12–46)
Lymphs Abs: 0.8 10*3/uL (ref 0.7–4.0)
MCH: 30.5 pg (ref 26.0–34.0)
MCHC: 34.6 g/dL (ref 30.0–36.0)
MCV: 88.1 fL (ref 78.0–100.0)
MONO ABS: 1 10*3/uL (ref 0.1–1.0)
Monocytes Relative: 10 % (ref 3–12)
Neutro Abs: 8 10*3/uL — ABNORMAL HIGH (ref 1.7–7.7)
Neutrophils Relative %: 80 % — ABNORMAL HIGH (ref 43–77)
PLATELETS: 205 10*3/uL (ref 150–400)
RBC: 4.36 MIL/uL (ref 4.22–5.81)
RDW: 13.9 % (ref 11.5–15.5)
WBC: 9.9 10*3/uL (ref 4.0–10.5)

## 2014-05-11 LAB — RAPID URINE DRUG SCREEN, HOSP PERFORMED
AMPHETAMINES: NOT DETECTED
BARBITURATES: NOT DETECTED
Benzodiazepines: NOT DETECTED
Cocaine: NOT DETECTED
Opiates: POSITIVE — AB
Tetrahydrocannabinol: NOT DETECTED

## 2014-05-11 LAB — COMPREHENSIVE METABOLIC PANEL
ALT: 30 U/L (ref 0–53)
AST: 50 U/L — AB (ref 0–37)
Albumin: 2.9 g/dL — ABNORMAL LOW (ref 3.5–5.2)
Alkaline Phosphatase: 54 U/L (ref 39–117)
Anion gap: 13 (ref 5–15)
BILIRUBIN TOTAL: 0.4 mg/dL (ref 0.3–1.2)
BUN: 7 mg/dL (ref 6–23)
CALCIUM: 9.2 mg/dL (ref 8.4–10.5)
CO2: 23 mEq/L (ref 19–32)
CREATININE: 0.56 mg/dL (ref 0.50–1.35)
Chloride: 92 mEq/L — ABNORMAL LOW (ref 96–112)
GFR calc Af Amer: >90 mL/min (ref >90)
GFR calc non Af Amer: >90 mL/min (ref >90)
GLUCOSE: 167 mg/dL — AB (ref 70–99)
Potassium: 5.3 mEq/L (ref 3.7–5.3)
Sodium: 128 mEq/L — ABNORMAL LOW (ref 137–147)
Total Protein: 8 g/dL (ref 6.0–8.3)

## 2014-05-11 LAB — VALPROIC ACID LEVEL: Valproic Acid Lvl: 59.1 ug/mL (ref 50.0–100.0)

## 2014-05-11 LAB — LIPASE, BLOOD: LIPASE: 36 U/L (ref 11–59)

## 2014-05-11 LAB — PHOSPHORUS: Phosphorus: 2.7 mg/dL (ref 2.3–4.6)

## 2014-05-11 LAB — URINE MICROSCOPIC-ADD ON

## 2014-05-11 LAB — MAGNESIUM: Magnesium: 2 mg/dL (ref 1.5–2.5)

## 2014-05-11 LAB — I-STAT CG4 LACTIC ACID, ED: LACTIC ACID, VENOUS: 0.57 mmol/L (ref 0.5–2.2)

## 2014-05-11 LAB — GLUCOSE, CAPILLARY: GLUCOSE-CAPILLARY: 143 mg/dL — AB (ref 70–99)

## 2014-05-11 MED ORDER — ENOXAPARIN SODIUM 60 MG/0.6ML ~~LOC~~ SOLN
60.0000 mg | SUBCUTANEOUS | Status: DC
  Administered 2014-05-11 – 2014-05-15 (×5): 60 mg via SUBCUTANEOUS
  Filled 2014-05-11 (×7): qty 0.6

## 2014-05-11 MED ORDER — SODIUM CHLORIDE 0.9 % IV SOLN
INTRAVENOUS | Status: DC
  Administered 2014-05-11 (×2): via INTRAVENOUS
  Administered 2014-05-12: 100 mL/h via INTRAVENOUS
  Administered 2014-05-13: 1000 mL via INTRAVENOUS
  Administered 2014-05-13 – 2014-05-16 (×4): via INTRAVENOUS

## 2014-05-11 MED ORDER — HYDROMORPHONE HCL PF 1 MG/ML IJ SOLN
1.0000 mg | INTRAMUSCULAR | Status: DC | PRN
  Administered 2014-05-11: 1 mg via INTRAVENOUS
  Filled 2014-05-11: qty 1

## 2014-05-11 MED ORDER — ENOXAPARIN SODIUM 40 MG/0.4ML ~~LOC~~ SOLN: 40.0000 mg | SUBCUTANEOUS | Status: DC

## 2014-05-11 MED ORDER — SODIUM CHLORIDE 0.9 % IV SOLN
1000.0000 mL | Freq: Once | INTRAVENOUS | Status: AC
  Administered 2014-05-11: 1000 mL via INTRAVENOUS

## 2014-05-11 MED ORDER — IOHEXOL 300 MG/ML  SOLN
25.0000 mL | Freq: Once | INTRAMUSCULAR | Status: AC | PRN
  Administered 2014-05-11: 25 mL via ORAL

## 2014-05-11 MED ORDER — IOHEXOL 300 MG/ML  SOLN
100.0000 mL | Freq: Once | INTRAMUSCULAR | Status: AC | PRN
  Administered 2014-05-11: 100 mL via INTRAVENOUS

## 2014-05-11 MED ORDER — ASENAPINE MALEATE 5 MG SL SUBL
10.0000 mg | SUBLINGUAL_TABLET | Freq: Two times a day (BID) | SUBLINGUAL | Status: DC
  Administered 2014-05-11 – 2014-05-16 (×11): 10 mg via SUBLINGUAL
  Filled 2014-05-11 (×14): qty 2

## 2014-05-11 MED ORDER — SODIUM CHLORIDE 0.9 % IV SOLN
INTRAVENOUS | Status: DC
  Administered 2014-05-11: 09:00:00 via INTRAVENOUS

## 2014-05-11 MED ORDER — ONDANSETRON HCL 4 MG/2ML IJ SOLN
4.0000 mg | Freq: Once | INTRAMUSCULAR | Status: AC
  Administered 2014-05-11: 4 mg via INTRAVENOUS
  Filled 2014-05-11: qty 2

## 2014-05-11 MED ORDER — FLUOXETINE HCL 20 MG PO CAPS
40.0000 mg | ORAL_CAPSULE | Freq: Every day | ORAL | Status: DC
  Administered 2014-05-11 – 2014-05-16 (×6): 40 mg via ORAL
  Filled 2014-05-11 (×7): qty 2

## 2014-05-11 MED ORDER — RISPERIDONE 2 MG PO TABS
4.0000 mg | ORAL_TABLET | Freq: Every day | ORAL | Status: DC
  Administered 2014-05-11 – 2014-05-15 (×5): 4 mg via ORAL
  Filled 2014-05-11 (×7): qty 2

## 2014-05-11 MED ORDER — DIVALPROEX SODIUM ER 500 MG PO TB24
2000.0000 mg | ORAL_TABLET | Freq: Every day | ORAL | Status: DC
  Administered 2014-05-11 – 2014-05-15 (×5): 2000 mg via ORAL
  Filled 2014-05-11 (×7): qty 4

## 2014-05-11 MED ORDER — MORPHINE SULFATE 4 MG/ML IJ SOLN
4.0000 mg | Freq: Once | INTRAMUSCULAR | Status: AC
  Administered 2014-05-11: 4 mg via INTRAVENOUS
  Filled 2014-05-11: qty 1

## 2014-05-11 MED ORDER — HYDROMORPHONE HCL PF 1 MG/ML IJ SOLN
1.0000 mg | INTRAMUSCULAR | Status: DC | PRN
  Administered 2014-05-11 – 2014-05-15 (×33): 1 mg via INTRAVENOUS
  Filled 2014-05-11 (×35): qty 1

## 2014-05-11 MED ORDER — PIPERACILLIN SOD-TAZOBACTAM SO 2.25 (2-0.25) G IV SOLR
3.3750 g | Freq: Once | INTRAVENOUS | Status: AC
  Administered 2014-05-11: 3.375 g via INTRAVENOUS
  Filled 2014-05-11: qty 3.38

## 2014-05-11 MED ORDER — ONDANSETRON HCL 4 MG PO TABS: 4.0000 mg | ORAL_TABLET | Freq: Four times a day (QID) | ORAL | Status: DC | PRN

## 2014-05-11 MED ORDER — PIPERACILLIN-TAZOBACTAM 3.375 G IVPB
3.3750 g | Freq: Three times a day (TID) | INTRAVENOUS | Status: DC
  Administered 2014-05-11 – 2014-05-16 (×15): 3.375 g via INTRAVENOUS
  Filled 2014-05-11 (×17): qty 50

## 2014-05-11 MED ORDER — INSULIN ASPART 100 UNIT/ML ~~LOC~~ SOLN
0.0000 [IU] | Freq: Three times a day (TID) | SUBCUTANEOUS | Status: DC
  Administered 2014-05-12 – 2014-05-15 (×5): 1 [IU] via SUBCUTANEOUS
  Administered 2014-05-15: 18:00:00 via SUBCUTANEOUS

## 2014-05-11 MED ORDER — CETYLPYRIDINIUM CHLORIDE 0.05 % MT LIQD
7.0000 mL | Freq: Two times a day (BID) | OROMUCOSAL | Status: DC
  Administered 2014-05-11 – 2014-05-12 (×4): 7 mL via OROMUCOSAL

## 2014-05-11 MED ORDER — ONDANSETRON HCL 4 MG/2ML IJ SOLN: 4.0000 mg | Freq: Four times a day (QID) | INTRAMUSCULAR | Status: DC | PRN

## 2014-05-11 NOTE — ED Notes (Signed)
Pt reporting abdominal pain 8/10. MD notified.

## 2014-05-11 NOTE — ED Provider Notes (Signed)
CSN: [id number]     Arrival date & time 05/11/14  0806 History   First MD Initiated Contact with Patient 05/11/14 0815     Chief Complaint  Patient presents with  . Abdominal Pain  . Back Pain     (Consider location/radiation/quality/duration/timing/severity/associated sxs/prior Treatment) HPI Patient is a 3 days of abdominal pain. He was seen seen for the same complaint 3 days ago in the emergency department. The pain is epigastric sharp and stabbing in nature it radiates into his back. It is made worse by movements is relieved by nothing. He has had retching but no vomiting. This occurred 3 days ago. Patient does have persistent nausea but has not developed any additional vomiting. There has been no diarrhea last bowel movement was today. No known fevers or chills. Patient has not had similar problem aside from this episode.   Past Medical History  Diagnosis Date  . Bipolar 1 disorder   . Cellulitis 08/24/2012    "RLE and spot on my right forearm" (08/24/2012)  . Type II diabetes mellitus ~ 2009    "take Metformin" (08/24/2012)   Past Surgical History  Procedure Laterality Date  . Open anterior shoulder reconstruction  ~ 2003    "right; kept dislocating; cut it open; sewed muscle back together; moved cartilage around and stapled that" (08/24/2012)  . Foot fracture surgery      "left; scaffold broke" 08/24/2012)  . Eye surgery     Family History  Problem Relation Age of Onset  . Diabetic kidney disease Maternal Uncle    History  Substance Use Topics  . Smoking status: Current Every Day Smoker -- 1.00 packs/day for 25 years    Types: Cigarettes, Pipe, Cigars  . Smokeless tobacco: Never Used     Comment: 08/24/2012 "stopped both snuff and chew ~ 10 yr ago"  . Alcohol Use: No     Comment: social drinker, 1x month.    Review of Systems  Constitutional: Negative for fever and chills.  HENT: Negative for congestion and sore throat.   Respiratory: Negative for cough and  shortness of breath.   Cardiovascular: Negative for chest pain and leg swelling.  Gastrointestinal: Positive for abdominal pain. Negative for vomiting and diarrhea.  Genitourinary: Negative for dysuria and difficulty urinating.  Musculoskeletal: Negative for arthralgias and myalgias.  Skin: Negative for color change and rash.  Neurological: Negative for dizziness, weakness and headaches.  Psychiatric/Behavioral: Negative for confusion and agitation.      Allergies  Review of patient's allergies indicates no known allergies.  Home Medications   Prior to Admission medications   Medication Sig Start Date End Date Taking? Authorizing Provider  asenapine (SAPHRIS) 5 MG SUBL Place 10 mg under the tongue 2 (two) times daily.     Historical Provider, MD  divalproex (DEPAKOTE ER) 500 MG 24 hr tablet Take 2,000 mg by mouth at bedtime.    Historical Provider, MD  FLUoxetine (PROZAC) 20 MG capsule Take 40 mg by mouth daily.    Historical Provider, MD  HYDROcodone-acetaminophen (NORCO) 5-325 MG per tablet Take 1-2 tablets by mouth every 4 (four) hours as needed. 05/09/14   Ephraim Hamburger, MD  omeprazole (PRILOSEC OTC) 20 MG tablet Take 20 mg by mouth daily.    Historical Provider, MD  risperidone (RISPERDAL) 4 MG tablet Take 4 mg by mouth at bedtime.     Historical Provider, MD   BP 154/92  Pulse 104  Temp(Src) 98.3 F (36.8 C) (Oral)  Resp 22  Ht 6\' 3"  (1.905 m)  Wt 290 lb (131.543 kg)  BMI 36.25 kg/m2  SpO2 95% Physical Exam  Constitutional: He is oriented to person, place, and time. He appears well-developed and well-nourished.  HENT:  Head: Normocephalic and atraumatic.  Eyes: EOM are normal. Pupils are equal, round, and reactive to light.  Neck: Neck supple.  Cardiovascular: Normal rate, regular rhythm, normal heart sounds and intact distal pulses.   Pulmonary/Chest: Effort normal and breath sounds normal.  Abdominal: Soft. Bowel sounds are normal. He exhibits no distension and no  mass. There is tenderness. There is rebound (the pain is very reproducible and epigastrium and right upper quadrant. Moderate to severe in nature.). There is no guarding.  Musculoskeletal: Normal range of motion. He exhibits no edema.  Neurological: He is alert and oriented to person, place, and time. He has normal strength. Coordination normal. GCS eye subscore is 4. GCS verbal subscore is 5. GCS motor subscore is 6.  Skin: Skin is warm, dry and intact.  Psychiatric: He has a normal mood and affect.    ED Course  Procedures (including critical care time) Labs Review Labs Reviewed - No data to display  Imaging Review Dg Chest 2 View  05/09/2014   CLINICAL DATA:  Chest pain  EXAM: CHEST  2 VIEW  COMPARISON:  None.  FINDINGS: The lungs are borderline hypoinflated but clear. The heart and pulmonary vascularity are normal. There is no pleural effusion, pneumothorax, or pneumomediastinum. The bony thorax is unremarkable.  IMPRESSION: There is no acute cardiopulmonary abnormality.   Electronically Signed   By: David  Martinique   On: 05/09/2014 12:42     EKG Interpretation None     CRITICAL CARE Performed by: Charlesetta Shanks   Total critical care time: 30 MINUTES  Critical care time was exclusive of separately billable procedures and treating other patients.  Critical care was necessary to treat or prevent imminent or life-threatening deterioration.  Critical care was time spent personally by me on the following activities: development of treatment plan with patient and/or surrogate as well as nursing, discussions with consultants, evaluation of patient's response to treatment, examination of patient, obtaining history from patient or surrogate, ordering and performing treatments and interventions, ordering and review of laboratory studies, ordering and review of radiographic studies, pulse oximetry and re-evaluation of patient's condition.  Hospitalist Doyle Askew has evaluated the patient in the  ER. Agrees with plan os Zosyn for significant inflammatory changes on CT.  MDM   Final diagnoses:  Acute pancreatitis, unspecified pancreatitis type   Patient is admitted with increasing abdominal pain seen 2 days earlier in the emergency department. At that point in time it appears that he had mild pancreatitis. Today CT scan has been performed and he does have significant inflammatory  changes. The patient does also have significant reproducible pain. The patient will be admitted with Zosyn and fluids n.p.o. for further treatment.    Charlesetta Shanks, MD 05/11/14 [phone]

## 2014-05-11 NOTE — Progress Notes (Signed)
UR completed 

## 2014-05-11 NOTE — ED Notes (Signed)
Antibiotics, Zosyn, started at this time. Pt states mother is on her way.

## 2014-05-11 NOTE — ED Notes (Signed)
Hospitalist, Dr. Doyle Askew, at bedside discussing treatment plan with pt. Pt made aware that he is being admitted for further treatment. Phlebotomy, Colletta Maryland, in room drawing blood cultures at this time as well. Antibiotics will be initiated after blood cultures are drawn. Continuing to encourage pt to deep breathe. O2 sats 93% at this time. Will continue to monitor.

## 2014-05-11 NOTE — ED Notes (Signed)
Pt leaving for CT at this time.

## 2014-05-11 NOTE — ED Notes (Signed)
1 mg of Dilaudid given at 12:34. Pt states relief of 9/10 pain to a 5/10. Pt resting comfortably watching television. Will continue to monitor until room becomes available.

## 2014-05-11 NOTE — ED Notes (Signed)
4 mg morphine given IV push. Pt states his pain is now a 5/10 without movement. Previously was 8/10 without movement.

## 2014-05-11 NOTE — ED Notes (Signed)
No cardiac history.

## 2014-05-11 NOTE — ED Notes (Signed)
Pt states mother is no longer coming to visit. Will come tomorrow morning.

## 2014-05-11 NOTE — ED Notes (Signed)
Pt aware of need for urine specimen. Pt states he will try to void when he is finished drinking the contrast.

## 2014-05-11 NOTE — H&P (Addendum)
[facility] History and Physical  James [name] [id number] DOB: May 04, 1976 DOA: 05/11/2014  Referring physician: ED physician PCP: Martinique, BETTY G, MD   Chief Complaint: abdominal pain   HPI:  Pt is 38 yo male who presented to [facility] ED with main concern of 3 days duration of progressively worsening left upper quadrant abdominal pain, sharp and radiating to the back, 7/10 in intensity and intermittent, associated with nausea and poor oral intake. Pt denies any specific alleviating factors, no fevers, chills, no similar events in the past.   In ED, pt noted to be hemodynamically stable, NAD, T = 99.6, CT abd worrisome for acute pancreatitis. Pt started on Zosyn in ED and [facility] asked to admit for further evaluation.   Assessment and Plan: Active Problems:   Pancreatitis - admit to medical floor - start with providing supportive care with IVF, analgesia, antiemetics as needed - agree with zosyn for now given low grade temp - keep NPO for now - repeat lipase level in AM   Hyponatremia - possibly from pre renal etiology - will provide IVF as noted above - repeat BMP in AM   Hyperkalemia - repeat BMP indicates K is WNL    Type II diabetes - will ask for A1C - place on SSI for now    Transaminitis - will repeat CMET in AM - pt denies alcohol use   DVT prophylaxis: lovenox  Radiological Exams on Admission: Ct Abdomen Pelvis W Contrast  05/11/2014    In addition to slight increase in the volume of a small pleural effusion, there has been the development of significant inflammatory change in left upper quadrant. Acute pancreatitis is favored, with no evidence of pancreatic necrosis or pseudocyst. Inflammation originating in the stomach or left colon are also considerations, although there is no evidence of free air to suggest perforation of viscus.     Code Status: Full Family Communication: Pt at bedside Disposition Plan: Admit for further evaluation    Review of Systems:   Constitutional: Negative for fever, chills and malaise/fatigue. Negative for diaphoresis.  HENT: Negative for hearing loss, ear pain, nosebleeds, congestion, sore throat, neck pain, tinnitus and ear discharge.   Eyes: Negative for blurred vision, double vision, photophobia, pain, discharge and redness.  Respiratory: Negative for cough, hemoptysis, sputum production, shortness of breath, wheezing and stridor.   Cardiovascular: Negative for chest pain, palpitations, orthopnea, claudication and leg swelling.  Gastrointestinal: Negative for heartburn, constipation, blood in stool and melena.  Genitourinary: Negative for dysuria, urgency, frequency, hematuria and flank pain.  Musculoskeletal: Negative for myalgias, joint pain and falls.  Skin: Negative for itching and rash.  Neurological: Negative for dizziness and weakness.  Endo/Heme/Allergies: Negative for environmental allergies and polydipsia. Does not bruise/bleed easily.  Psychiatric/Behavioral: Negative for suicidal ideas. The patient is not nervous/anxious.      Past Medical History  Diagnosis Date  . Bipolar 1 disorder   . Cellulitis 08/24/2012    "RLE and spot on my right forearm" (08/24/2012)  . Type II diabetes mellitus ~ 2009    "take Metformin" (08/24/2012)    Past Surgical History  Procedure Laterality Date  . Open anterior shoulder reconstruction  ~ 2003    "right; kept dislocating; cut it open; sewed muscle back together; moved cartilage around and stapled that" (08/24/2012)  . Foot fracture surgery      "left; scaffold broke" 08/24/2012)  . Eye surgery      Social History:  reports that he has been smoking Cigarettes,  Pipe, and Cigars.  He has a 25 pack-year smoking history. He has never used smokeless tobacco. He reports that he does not drink alcohol or use illicit drugs.  No Known Allergies  Family History  Problem Relation Age of Onset  . Diabetic kidney disease Maternal Uncle     Prior to Admission  medications   Medication Sig Start Date End Date Taking? Authorizing Provider  asenapine (SAPHRIS) 5 MG SUBL Place 10 mg under the tongue 2 (two) times daily.    Yes Historical Provider, MD  divalproex (DEPAKOTE ER) 500 MG 24 hr tablet Take 2,000 mg by mouth at bedtime.   Yes Historical Provider, MD  FLUoxetine (PROZAC) 20 MG capsule Take 40 mg by mouth daily.   Yes Historical Provider, MD  HYDROcodone-acetaminophen (NORCO/VICODIN) 5-325 MG per tablet Take 1-2 tablets by mouth every 4 (four) hours as needed for moderate pain.   Yes Historical Provider, MD  omeprazole (PRILOSEC OTC) 20 MG tablet Take 20 mg by mouth daily.   Yes Historical Provider, MD  risperidone (RISPERDAL) 4 MG tablet Take 4 mg by mouth at bedtime.    Yes Historical Provider, MD    Physical Exam: Filed Vitals:   05/11/14 0902 05/11/14 1031 05/11/14 1125 05/11/14 1239  BP:  146/73 148/71 127/60  Pulse:  94 93 88  Temp: 99 F (37.2 C) 99.2 F (37.3 C) 99.6 F (37.6 C) 99.5 F (37.5 C)  TempSrc: Oral Oral Oral Oral  Resp: 18 18 18 18   Height:      Weight:      SpO2: 93% 95% 94% 92%    Physical Exam  Constitutional: Appears well-developed and well-nourished. No distress.  HENT: Normocephalic. External right and left ear normal. Oropharynx is clear and moist.  Eyes: Conjunctivae and EOM are normal. PERRLA, no scleral icterus.  Neck: Normal ROM. Neck supple. No JVD. No tracheal deviation. No thyromegaly.  CVS: RRR, S1/S2 +, no murmurs, no gallops, no carotid bruit.  Pulmonary: Effort and breath sounds normal, no stridor, rhonchi, wheezes, rales.  Abdominal: Soft. BS +,  no distension, tenderness in left upper quadrant, no rebound or guarding.  Musculoskeletal: Normal range of motion. No edema and no tenderness.  Lymphadenopathy: No lymphadenopathy noted, cervical, inguinal. Neuro: Alert. Normal reflexes, muscle tone coordination. No cranial nerve deficit. Skin: Skin is warm and dry. No rash noted. Not diaphoretic.  No erythema. No pallor.  Psychiatric: Normal mood and affect. Behavior, judgment, thought content normal.   Labs on Admission:  Basic Metabolic Panel:  Recent Labs Lab 05/09/14 1255 05/11/14 0839  NA 136* 128*  K 5.7* 5.3  CL 96 92*  CO2 22 23  GLUCOSE 184* 167*  BUN 9 7  CREATININE 0.54 0.56  CALCIUM 8.9 9.2  MG  --  2.0  PHOS  --  2.7   Liver Function Tests:  Recent Labs Lab 05/09/14 1255 05/11/14 0839  AST 80* 50*  ALT 50 30  ALKPHOS 52 54  BILITOT 0.3 0.4  PROT 7.8 8.0  ALBUMIN 3.6 2.9*    Recent Labs Lab 05/09/14 1255 05/11/14 0839  LIPASE 163* 36   CBC:  Recent Labs Lab 05/09/14 1255 05/11/14 0839  WBC 8.5 9.9  NEUTROABS 6.6 8.0*  HGB 15.1 13.3  HCT 42.2 38.4*  MCV 87.6 88.1  PLT 195 205   EKG: Normal sinus rhythm, no ST/T wave changes  Faye Ramsay, MD  [facility] Pager [phone]  If 7PM-7AM, please contact night-coverage [email or web address] Password [facility] 05/11/2014, 12:59  PM       

## 2014-05-11 NOTE — ED Notes (Signed)
Pt back from CT at this time 

## 2014-05-11 NOTE — ED Notes (Signed)
Pt encouraged to void when able. 

## 2014-05-11 NOTE — ED Notes (Signed)
Pt states he was at [facility] on Wednesday for the same pain he is having today. He states they couldn't find a reason for his pain but told him if it got worse to come back to the hospital. He states his pain is 9/10. Pain in localized to lower abdomen and moves to his back. States that it hurts worse when he breaths or moves. Denies any injury to muscles in last week. Last BM 8/28.  States he urinated this am and no change was noted.

## 2014-05-11 NOTE — Progress Notes (Signed)
ANTIBIOTIC CONSULT NOTE - INITIAL  Pharmacy Consult for Zosyn Indication: Intra-abdominal infection  No Known Allergies  Patient Measurements: Height: 6\' 3"  (190.5 cm) Weight: 290 lb (131.543 kg) IBW/kg (Calculated) : 84.5  Vital Signs: Temp: 99.6 F (37.6 C) (08/28 1323) Temp src: Oral (08/28 1323) BP: 127/60 mmHg (08/28 1239) Pulse Rate: 91 (08/28 1323) Intake/Output from previous day:   Intake/Output from this shift: Total I/O In: -  Out: 500 [Urine:500]  Labs:  Recent Labs  05/09/14 1255 05/11/14 0839  WBC 8.5 9.9  HGB 15.1 13.3  PLT 195 205  CREATININE 0.54 0.56   Estimated Creatinine Clearance: 179.3 ml/min (by C-G formula based on Cr of 0.56). No results found for this basename: VANCOTROUGH, VANCOPEAK, VANCORANDOM, GENTTROUGH, GENTPEAK, GENTRANDOM, TOBRATROUGH, TOBRAPEAK, TOBRARND, AMIKACINPEAK, AMIKACINTROU, AMIKACIN,  in the last 72 hours   Microbiology: No results found for this or any previous visit (from the past 720 hour(s)).  Medical History: Past Medical History  Diagnosis Date  . Bipolar 1 disorder   . Cellulitis 08/24/2012    "RLE and spot on my right forearm" (08/24/2012)  . Type II diabetes mellitus ~ 2009    "take Metformin" (08/24/2012)    Medications:  Scheduled:  . asenapine  10 mg Sublingual BID  . divalproex  2,000 mg Oral QHS  . enoxaparin (LOVENOX) injection  40 mg Subcutaneous Q24H  . FLUoxetine  40 mg Oral Daily  . risperidone  4 mg Oral QHS   Infusions:  . sodium chloride     PRN: HYDROmorphone (DILAUDID) injection, HYDROmorphone (DILAUDID) injection, ondansetron (ZOFRAN) IV, ondansetron  Assessment: 38 yo male presents with 3 days of abdominal pain. CT 3 days ago showed mild pancreatitis. Pharmacy consulted to dose Zosyn for possible intra-abdominal infection.  Goal of Therapy:  Eradication of infection Dose appropriate for renal function  Plan:  Zosyn 3.375gm IV q8h (4hr extended infusions) No further dosing  adjustments needed, Pharmacy will sign-off  Peggyann Juba, PharmD, BCPS Pager: [phone] 05/11/2014,1:43 PM

## 2014-05-11 NOTE — ED Notes (Signed)
Pt complains of pain when taking deep breaths. As a result his O2 sats are remaining in the low 90's. With deep breaths pt O2 sats 95%. Deep breaths encouraged. Pt verbalizes understanding and is performing deep breaths at this time. Will continue to monitor.

## 2014-05-11 NOTE — ED Notes (Signed)
Report given to [place], RN on 5W. Pt will be admitted to room 1525.

## 2014-05-12 LAB — GLUCOSE, CAPILLARY
Glucose-Capillary: 128 mg/dL — ABNORMAL HIGH (ref 70–99)
Glucose-Capillary: 89 mg/dL (ref 70–99)
Glucose-Capillary: 96 mg/dL (ref 70–99)
Glucose-Capillary: 181 mg/dL — ABNORMAL HIGH (ref 70–99)

## 2014-05-12 LAB — COMPREHENSIVE METABOLIC PANEL
ALK PHOS: 51 U/L (ref 39–117)
ALT: 20 U/L (ref 0–53)
AST: 11 U/L (ref 0–37)
Albumin: 2.5 g/dL — ABNORMAL LOW (ref 3.5–5.2)
Anion gap: 12 (ref 5–15)
BUN: 10 mg/dL (ref 6–23)
CO2: 27 mEq/L (ref 19–32)
Calcium: 8.7 mg/dL (ref 8.4–10.5)
Chloride: 98 mEq/L (ref 96–112)
Creatinine, Ser: 0.67 mg/dL (ref 0.50–1.35)
GFR calc Af Amer: >90 mL/min (ref >90)
GFR calc non Af Amer: >90 mL/min (ref >90)
Glucose, Bld: 112 mg/dL — ABNORMAL HIGH (ref 70–99)
POTASSIUM: 3.8 meq/L (ref 3.7–5.3)
SODIUM: 137 meq/L (ref 137–147)
Total Bilirubin: 0.4 mg/dL (ref 0.3–1.2)
Total Protein: 6.9 g/dL (ref 6.0–8.3)

## 2014-05-12 LAB — CBC
HEMATOCRIT: 33.1 % — AB (ref 39.0–52.0)
Hemoglobin: 11.4 g/dL — ABNORMAL LOW (ref 13.0–17.0)
MCH: 30.1 pg (ref 26.0–34.0)
MCHC: 32.9 g/dL (ref 30.0–36.0)
MCV: 91.4 fL (ref 78.0–100.0)
Platelets: 168 10*3/uL (ref 150–400)
RBC: 3.62 MIL/uL — ABNORMAL LOW (ref 4.22–5.81)
RDW: 13.8 % (ref 11.5–15.5)
WBC: 7.9 10*3/uL (ref 4.0–10.5)

## 2014-05-12 LAB — HEMOGLOBIN A1C
Hgb A1c MFr Bld: 7.1 % — ABNORMAL HIGH (ref <5.7)
Mean Plasma Glucose: 157 mg/dL — ABNORMAL HIGH (ref <117)

## 2014-05-12 LAB — LIPASE, BLOOD: Lipase: 25 U/L (ref 11–59)

## 2014-05-12 NOTE — Progress Notes (Signed)
Patient ID: James [name], male   DOB: 03-18-1976, 37 y.o.   MRN: [id number] [facility] PROGRESS NOTE  James [name] [id number] DOB: 07/07/1976 DOA: 05/11/2014 PCP: Martinique, BETTY G, MD  Brief narrative: Pt is 38 yo male who presented to [facility] ED with main concern of 3 days duration of progressively worsening left upper quadrant abdominal pain, sharp and radiating to the back, 7/10 in intensity and intermittent, associated with nausea and poor oral intake. Pt denies any specific alleviating factors, no fevers, chills, no similar events in the past.   In ED, pt noted to be hemodynamically stable, NAD, T = 99.6, CT abd worrisome for acute pancreatitis. Pt started on Zosyn in ED and [facility] asked to admit for further evaluation.   Assessment and Plan:  Active Problems:  Pancreatitis  - pt reports feeling better - continue providing supportive care with IVF, analgesia, antiemetics as needed  - continue Zosyn day #2 - allow clears today  - lipase is WNL this AM  Hyponatremia  - possibly from pre renal etiology  - IVF provided and Sodium is now WNL  - repeat BMP in AM  Hyperkalemia  - K is WNL  Type II diabetes  - A1C 7.1 - SSI for now  - metformin on discharge  Transaminitis  - LFT's WNL this AM - pt denies alcohol use   Acute blood loss anemia - likely dilutional as no active bleeding reported per pt - repeat CBC in AM  DVT prophylaxis  Lovenox SQ while pt is in hospital  Code Status: Full Family Communication: Pt at bedside Disposition Plan: Home when medically stable   IV Access:   Peripheral IV Procedures and diagnostic studies:   Ct Abdomen Pelvis W Contrast  05/11/2014  In addition to slight increase in the volume of a small pleural effusion, there has been the development of significant inflammatory change in left upper quadrant. Acute pancreatitis is favored, with no evidence of pancreatic necrosis or pseudocyst. Inflammation originating in the stomach or  left colon are also considerations, although there is no evidence of free air to suggest perforation of viscus.  Medical Consultants:   None  Other Consultants:   None  Anti-Infectives:   Zosyn 8/28 -->  Faye Ramsay, MD  [facility] Pager [phone]  If 7PM-7AM, please contact night-coverage [email or web address] Password [facility] 05/12/2014, 5:57 PM   LOS: 1 day   HPI/Subjective: No events overnight.   Objective: Filed Vitals:   05/11/14 1406 05/11/14 2132 05/12/14 0509 05/12/14 1400  BP: 134/72 143/76 135/74 138/60  Pulse: 86 85 80 73  Temp: 98.3 F (36.8 C) 98 F (36.7 C) 98.1 F (36.7 C) 98.6 F (37 C)  TempSrc: Oral Oral Oral Oral  Resp: 18 18 18 18   Height: 6\' 3"  (1.905 m)     Weight: 133.766 kg (294 lb 14.4 oz)     SpO2: 93% 90% 90% 95%    Intake/Output Summary (Last 24 hours) at 05/12/14 1757 Last data filed at 05/12/14 1752  Gross per 24 hour  Intake 2051.67 ml  Output   1350 ml  Net 701.67 ml    Exam:   General:  Pt is alert, follows commands appropriately, not in acute distress  Cardiovascular: Regular rate and rhythm, S1/S2, no murmurs, no rubs, no gallops  Respiratory: Clear to auscultation bilaterally, no wheezing, no crackles, no rhonchi  Abdomen: Soft, tender in upper abd quads L > R, slightly distended, bowel sounds present, no guarding  Extremities: No edema,  pulses DP and PT palpable bilaterally  Neuro: Grossly nonfocal  Data Reviewed: Basic Metabolic Panel:  Recent Labs Lab 05/09/14 1255 05/11/14 0839 05/12/14 0505  NA 136* 128* 137  K 5.7* 5.3 3.8  CL 96 92* 98  CO2 22 23 27   GLUCOSE 184* 167* 112*  BUN 9 7 10   CREATININE 0.54 0.56 0.67  CALCIUM 8.9 9.2 8.7  MG  --  2.0  --   PHOS  --  2.7  --    Liver Function Tests:  Recent Labs Lab 05/09/14 1255 05/11/14 0839 05/12/14 0505  AST 80* 50* 11  ALT 50 30 20  ALKPHOS 52 54 51  BILITOT 0.3 0.4 0.4  PROT 7.8 8.0 6.9  ALBUMIN 3.6 2.9* 2.5*    Recent Labs Lab  05/09/14 1255 05/11/14 0839 05/12/14 0505  LIPASE 163* 36 25   No results found for this basename: AMMONIA,  in the last 168 hours CBC:  Recent Labs Lab 05/09/14 1255 05/11/14 0839 05/12/14 0505  WBC 8.5 9.9 7.9  NEUTROABS 6.6 8.0*  --   HGB 15.1 13.3 11.4*  HCT 42.2 38.4* 33.1*  MCV 87.6 88.1 91.4  PLT 195 205 168   CBG:  Recent Labs Lab 05/11/14 2131 05/12/14 0801 05/12/14 1239 05/12/14 1708  GLUCAP 143* 128* 89 96    Recent Results (from the past 240 hour(s))  CULTURE, BLOOD (ROUTINE X 2)     Status: None   Collection Time    05/11/14 11:55 AM      Result Value Ref Range Status   Specimen Description BLOOD RIGHT ANTECUBITAL   Final   Special Requests BOTTLES DRAWN AEROBIC AND ANAEROBIC 4.5ML   Final   Culture  Setup Time     Final   Value: 05/11/2014 14:29     Performed at [facility]   Culture     Final   Value:        BLOOD CULTURE RECEIVED NO GROWTH TO DATE CULTURE WILL BE HELD FOR 5 DAYS BEFORE ISSUING A FINAL NEGATIVE REPORT     Performed at [facility]   Report Status PENDING   Incomplete  CULTURE, BLOOD (ROUTINE X 2)     Status: None   Collection Time    05/11/14 11:57 AM      Result Value Ref Range Status   Specimen Description BLOOD RIGHT HAND   Final   Special Requests BOTTLES DRAWN AEROBIC AND ANAEROBIC 4ML   Final   Culture  Setup Time     Final   Value: 05/11/2014 14:29     Performed at [facility]   Culture     Final   Value:        BLOOD CULTURE RECEIVED NO GROWTH TO DATE CULTURE WILL BE HELD FOR 5 DAYS BEFORE ISSUING A FINAL NEGATIVE REPORT     Performed at [facility]   Report Status PENDING   Incomplete     Scheduled Meds: . antiseptic oral rinse  7 mL Mouth Rinse BID  . asenapine  10 mg Sublingual BID  . divalproex  2,000 mg Oral QHS  . enoxaparin (LOVENOX) injection  60 mg Subcutaneous Q24H  . FLUoxetine  40 mg Oral Daily  . insulin aspart  0-9 Units Subcutaneous TID WC  .  piperacillin-tazobactam (ZOSYN)  IV  3.375 g Intravenous Q8H  . risperidone  4 mg Oral QHS   Continuous Infusions: . sodium chloride 100 mL/hr (05/12/14 1548)

## 2014-05-13 LAB — GLUCOSE, CAPILLARY
GLUCOSE-CAPILLARY: 102 mg/dL — AB (ref 70–99)
GLUCOSE-CAPILLARY: 134 mg/dL — AB (ref 70–99)
Glucose-Capillary: 111 mg/dL — ABNORMAL HIGH (ref 70–99)
Glucose-Capillary: 149 mg/dL — ABNORMAL HIGH (ref 70–99)

## 2014-05-13 LAB — CBC
HEMATOCRIT: 32.3 % — AB (ref 39.0–52.0)
Hemoglobin: 10.8 g/dL — ABNORMAL LOW (ref 13.0–17.0)
MCH: 30.6 pg (ref 26.0–34.0)
MCHC: 33.4 g/dL (ref 30.0–36.0)
MCV: 91.5 fL (ref 78.0–100.0)
Platelets: 214 10*3/uL (ref 150–400)
RBC: 3.53 MIL/uL — AB (ref 4.22–5.81)
RDW: 13.7 % (ref 11.5–15.5)
WBC: 6 10*3/uL (ref 4.0–10.5)

## 2014-05-13 LAB — COMPREHENSIVE METABOLIC PANEL
ALBUMIN: 2.5 g/dL — AB (ref 3.5–5.2)
ALK PHOS: 61 U/L (ref 39–117)
ALT: 15 U/L (ref 0–53)
AST: 10 U/L (ref 0–37)
Anion gap: 12 (ref 5–15)
BUN: 7 mg/dL (ref 6–23)
CALCIUM: 8.7 mg/dL (ref 8.4–10.5)
CO2: 28 mEq/L (ref 19–32)
Chloride: 96 mEq/L (ref 96–112)
Creatinine, Ser: 0.59 mg/dL (ref 0.50–1.35)
GFR calc Af Amer: >90 mL/min (ref >90)
GFR calc non Af Amer: >90 mL/min (ref >90)
Glucose, Bld: 98 mg/dL (ref 70–99)
POTASSIUM: 3.5 meq/L — AB (ref 3.7–5.3)
Sodium: 136 mEq/L — ABNORMAL LOW (ref 137–147)
TOTAL PROTEIN: 6.9 g/dL (ref 6.0–8.3)
Total Bilirubin: 0.3 mg/dL (ref 0.3–1.2)

## 2014-05-13 MED ORDER — POLYETHYLENE GLYCOL 3350 17 G PO PACK
17.0000 g | PACK | Freq: Every day | ORAL | Status: DC
  Administered 2014-05-14 – 2014-05-16 (×3): 17 g via ORAL
  Filled 2014-05-13 (×5): qty 1

## 2014-05-13 MED ORDER — SENNOSIDES 8.8 MG/5ML PO SYRP
5.0000 mL | ORAL_SOLUTION | Freq: Two times a day (BID) | ORAL | Status: DC
  Administered 2014-05-14: 5 mL via ORAL
  Filled 2014-05-13 (×11): qty 5

## 2014-05-13 NOTE — Progress Notes (Signed)
Patient ID: James [name], male   DOB: 30-Nov-1975, 38 y.o.   MRN: [id number]  [facility] PROGRESS NOTE  James [name] [id number] DOB: 06/05/1976 DOA: 05/11/2014 PCP: Martinique, BETTY G, MD  Brief narrative:  Pt is 38 yo male who presented to [facility] ED with main concern of 3 days duration of progressively worsening left upper quadrant abdominal pain, sharp and radiating to the back, 7/10 in intensity and intermittent, associated with nausea and poor oral intake. Pt denies any specific alleviating factors, no fevers, chills, no similar events in the past.   In ED, pt noted to be hemodynamically stable, NAD, T = 99.6, CT abd worrisome for acute pancreatitis. Pt started on Zosyn in ED and [facility] asked to admit for further evaluation.   Assessment and Plan:  Active Problems:  Pancreatitis  - pt reports feeling better  - continue providing supportive care with IVF, analgesia, antiemetics as needed  - continue Zosyn day #3 - allow full liquid today  - lipase is WNL this AM  Hyponatremia  - possibly from pre renal etiology  - IVF provided and Sodium is now 136 - repeat BMP in AM  Hypokalemia - supplement and repeat BMP in AM Type II diabetes  - A1C 7.1  - SSI for now  - metformin on discharge  Transaminitis  - LFT's WNL this AM  - pt denies alcohol use  Acute blood loss anemia  - likely dilutional as no active bleeding reported per pt  - repeat CBC in AM   DVT prophylaxis  Lovenox SQ while pt is in hospital  Code Status: Full  Family Communication: Pt at bedside  Disposition Plan: Home when medically stable   IV Access:   Peripheral IV Procedures and diagnostic studies:   Ct Abdomen Pelvis W Contrast 05/11/2014 In addition to slight increase in the volume of a small pleural effusion, there has been the development of significant inflammatory change in left upper quadrant. Acute pancreatitis is favored, with no evidence of pancreatic necrosis or pseudocyst. Inflammation  originating in the stomach or left colon are also considerations, although there is no evidence of free air to suggest perforation of viscus.  Medical Consultants:   None  Other Consultants:   None  Anti-Infectives:   Zosyn 8/28 -->  Faye Ramsay, MD  [facility] Pager [phone]  If 7PM-7AM, please contact night-coverage [email or web address] Password [email or web address] 05/13/2014, 3:25 PM   LOS: 2 days   HPI/Subjective: No events overnight.   Objective: Filed Vitals:   05/12/14 0509 05/12/14 1400 05/12/14 2209 05/13/14 0541  BP: 135/74 138/60 146/85 148/80  Pulse: 80 73 67 60  Temp: 98.1 F (36.7 C) 98.6 F (37 C) 98 F (36.7 C) 97.7 F (36.5 C)  TempSrc: Oral Oral Oral Oral  Resp: 18 18 18 18   Height:      Weight:      SpO2: 90% 95% 90% 92%    Intake/Output Summary (Last 24 hours) at 05/13/14 1525 Last data filed at 05/13/14 1410  Gross per 24 hour  Intake    415 ml  Output   1600 ml  Net  -1185 ml    Exam:   General:  Pt is alert, follows commands appropriately, not in acute distress  Cardiovascular: Regular rate and rhythm, S1/S2, no murmurs, no rubs, no gallops  Respiratory: Clear to auscultation bilaterally, no wheezing, no crackles, no rhonchi  Abdomen: Soft, tender in epigastric area, non distended, bowel sounds present, no guarding  Extremities: No edema,  pulses DP and PT palpable bilaterally  Neuro: Grossly nonfocal  Data Reviewed: Basic Metabolic Panel:  Recent Labs Lab 05/09/14 1255 05/11/14 0839 05/12/14 0505 05/13/14 0504  NA 136* 128* 137 136*  K 5.7* 5.3 3.8 3.5*  CL 96 92* 98 96  CO2 22 23 27 28   GLUCOSE 184* 167* 112* 98  BUN 9 7 10 7   CREATININE 0.54 0.56 0.67 0.59  CALCIUM 8.9 9.2 8.7 8.7  MG  --  2.0  --   --   PHOS  --  2.7  --   --    Liver Function Tests:  Recent Labs Lab 05/09/14 1255 05/11/14 0839 05/12/14 0505 05/13/14 0504  AST 80* 50* 11 10  ALT 50 30 20 15   ALKPHOS 52 54 51 61  BILITOT 0.3 0.4 0.4 0.3  PROT 7.8 8.0 6.9  6.9  ALBUMIN 3.6 2.9* 2.5* 2.5*    Recent Labs Lab 05/09/14 1255 05/11/14 0839 05/12/14 0505  LIPASE 163* 36 25   No results found for this basename: AMMONIA,  in the last 168 hours CBC:  Recent Labs Lab 05/09/14 1255 05/11/14 0839 05/12/14 0505 05/13/14 0504  WBC 8.5 9.9 7.9 6.0  NEUTROABS 6.6 8.0*  --   --   HGB 15.1 13.3 11.4* 10.8*  HCT 42.2 38.4* 33.1* 32.3*  MCV 87.6 88.1 91.4 91.5  PLT 195 205 168 214   CBG:  Recent Labs Lab 05/12/14 1239 05/12/14 1708 05/12/14 2103 05/13/14 0731 05/13/14 1149  GLUCAP 89 96 181* 102* 111*    Recent Results (from the past 240 hour(s))  CULTURE, BLOOD (ROUTINE X 2)     Status: None   Collection Time    05/11/14 11:55 AM      Result Value Ref Range Status   Specimen Description BLOOD RIGHT ANTECUBITAL   Final   Special Requests BOTTLES DRAWN AEROBIC AND ANAEROBIC 4.5ML   Final   Culture  Setup Time     Final   Value: 05/11/2014 14:29     Performed at [facility]   Culture     Final   Value:        BLOOD CULTURE RECEIVED NO GROWTH TO DATE CULTURE WILL BE HELD FOR 5 DAYS BEFORE ISSUING A FINAL NEGATIVE REPORT     Performed at [facility]   Report Status PENDING   Incomplete  CULTURE, BLOOD (ROUTINE X 2)     Status: None   Collection Time    05/11/14 11:57 AM      Result Value Ref Range Status   Specimen Description BLOOD RIGHT HAND   Final   Special Requests BOTTLES DRAWN AEROBIC AND ANAEROBIC 4ML   Final   Culture  Setup Time     Final   Value: 05/11/2014 14:29     Performed at [facility]   Culture     Final   Value:        BLOOD CULTURE RECEIVED NO GROWTH TO DATE CULTURE WILL BE HELD FOR 5 DAYS BEFORE ISSUING A FINAL NEGATIVE REPORT     Performed at [facility]   Report Status PENDING   Incomplete     Scheduled Meds: . antiseptic oral rinse  7 mL Mouth Rinse BID  . asenapine  10 mg Sublingual BID  . divalproex  2,000 mg Oral QHS  . enoxaparin (LOVENOX) injection  60 mg  Subcutaneous Q24H  . FLUoxetine  40 mg Oral Daily  . insulin aspart  0-9  Units Subcutaneous TID WC  . piperacillin-tazobactam (ZOSYN)  IV  3.375 g Intravenous Q8H  . polyethylene glycol  17 g Oral Daily  . risperidone  4 mg Oral QHS  . sennosides  5 mL Oral BID   Continuous Infusions: . sodium chloride 100 mL (05/13/14 1148)

## 2014-05-14 ENCOUNTER — Inpatient Hospital Stay (HOSPITAL_COMMUNITY): Payer: Medicare Other

## 2014-05-14 LAB — CBC
HCT: 35.4 % — ABNORMAL LOW (ref 39.0–52.0)
HEMOGLOBIN: 11.7 g/dL — AB (ref 13.0–17.0)
MCH: 30.5 pg (ref 26.0–34.0)
MCHC: 33.1 g/dL (ref 30.0–36.0)
MCV: 92.4 fL (ref 78.0–100.0)
PLATELETS: 208 10*3/uL (ref 150–400)
RBC: 3.83 MIL/uL — ABNORMAL LOW (ref 4.22–5.81)
RDW: 13.5 % (ref 11.5–15.5)
WBC: 4.1 10*3/uL (ref 4.0–10.5)

## 2014-05-14 LAB — COMPREHENSIVE METABOLIC PANEL
ALBUMIN: 2.7 g/dL — AB (ref 3.5–5.2)
ALK PHOS: 50 U/L (ref 39–117)
ALT: 14 U/L (ref 0–53)
AST: 11 U/L (ref 0–37)
Anion gap: 13 (ref 5–15)
BUN: 4 mg/dL — ABNORMAL LOW (ref 6–23)
CHLORIDE: 97 meq/L (ref 96–112)
CO2: 28 meq/L (ref 19–32)
Calcium: 9.1 mg/dL (ref 8.4–10.5)
Creatinine, Ser: 0.72 mg/dL (ref 0.50–1.35)
GFR calc Af Amer: >90 mL/min (ref >90)
GFR calc non Af Amer: >90 mL/min (ref >90)
Glucose, Bld: 114 mg/dL — ABNORMAL HIGH (ref 70–99)
Potassium: 3.6 mEq/L — ABNORMAL LOW (ref 3.7–5.3)
Sodium: 138 mEq/L (ref 137–147)
Total Bilirubin: 0.2 mg/dL — ABNORMAL LOW (ref 0.3–1.2)
Total Protein: 7.1 g/dL (ref 6.0–8.3)

## 2014-05-14 LAB — GLUCOSE, CAPILLARY
GLUCOSE-CAPILLARY: 111 mg/dL — AB (ref 70–99)
GLUCOSE-CAPILLARY: 130 mg/dL — AB (ref 70–99)
GLUCOSE-CAPILLARY: 188 mg/dL — AB (ref 70–99)
Glucose-Capillary: 118 mg/dL — ABNORMAL HIGH (ref 70–99)

## 2014-05-14 LAB — LIPASE, BLOOD: LIPASE: 27 U/L (ref 11–59)

## 2014-05-14 NOTE — Progress Notes (Signed)
Patient ID: James [name], male   DOB: [birth year], 38 y.o.   MRN: [id number]  [facility] PROGRESS NOTE  James [name] [id number] DOB: December 16, 1975 DOA: 05/11/2014 PCP: Martinique, BETTY G, MD  Brief narrative:  Pt is 38 yo male who presented to [facility] ED with main concern of 3 days duration of progressively worsening left upper quadrant abdominal pain, sharp and radiating to the back, 7/10 in intensity and intermittent, associated with nausea and poor oral intake. Pt denies any specific alleviating factors, no fevers, chills, no similar events in the past.   In ED, pt noted to be hemodynamically stable, NAD, T = 99.6, CT abd worrisome for acute pancreatitis. Pt started on Zosyn in ED and [facility] asked to admit for further evaluation.   Assessment and Plan:  Active Problems:  Pancreatitis  - complains of generalized abd pain this AM - continue providing supportive care with IVF, analgesia, antiemetics as needed  - continue Zosyn day #4 - allow full liquid today and will advance as tolerated - lipase is WNL - Will obtain abd xray as pt's abd seems more distended today Hyponatremia  - possibly from pre renal etiology  - IVF provided and Sodium is now 136 - repeat BMP in AM  Hypokalemia - supplement and repeat BMP in AM Type II diabetes  - A1C 7.1  - SSI for now  - metformin on discharge  Transaminitis  - LFT's WNL this AM  - pt denies alcohol use  Acute blood loss anemia  - likely dilutional as no active bleeding reported per pt  - repeat CBC in AM   DVT prophylaxis  Lovenox SQ while pt is in hospital  Code Status: Full  Family Communication: Pt at bedside  Disposition Plan: Home when medically stable   IV Access:   Peripheral IV Procedures and diagnostic studies:   Ct Abdomen Pelvis W Contrast 05/11/2014 In addition to slight increase in the volume of a small pleural effusion, there has been the development of significant inflammatory change in left upper quadrant.  Acute pancreatitis is favored, with no evidence of pancreatic necrosis or pseudocyst. Inflammation originating in the stomach or left colon are also considerations, although there is no evidence of free air to suggest perforation of viscus.  Medical Consultants:   None  Other Consultants:   None  Anti-Infectives:   Zosyn 8/28 -->  [name], Orpah Melter, MD  [facility] Pager [phone]  If 7PM-7AM, please contact night-coverage [email or web address] Password [email or web address] 05/14/2014, 12:42 PM   LOS: 3 days   HPI/Subjective: No events overnight. Complains of generalized abd pain and bloating. Eager to try regular PO, however.  Objective: Filed Vitals:   05/13/14 0541 05/13/14 1400 05/13/14 2100 05/14/14 0630  BP: 148/80 160/95 146/83 162/96  Pulse: 60 63 65 53  Temp: 97.7 F (36.5 C) 98.3 F (36.8 C) 98.2 F (36.8 C) 98 F (36.7 C)  TempSrc: Oral Oral Oral Oral  Resp: 18 16 18 16   Height:      Weight:      SpO2: 92% 93% 94% 95%    Intake/Output Summary (Last 24 hours) at 05/14/14 1242 Last data filed at 05/14/14 1205  Gross per 24 hour  Intake   2030 ml  Output   3100 ml  Net  -1070 ml    Exam:   General:  Pt is alert, follows commands appropriately, not in acute distress  Cardiovascular: Regular rate and rhythm, S1/S2, no murmurs, no rubs, no gallops  Respiratory: Clear  to auscultation bilaterally, no wheezing, no crackles, no rhonchi  Abdomen: More distended, pos BS, generally tender in all quadrants  Extremities: No edema, pulses DP and PT palpable bilaterally  Neuro: Grossly nonfocal  Data Reviewed: Basic Metabolic Panel:  Recent Labs Lab 05/09/14 1255 05/11/14 0839 05/12/14 0505 05/13/14 0504 05/14/14 0510  NA 136* 128* 137 136* 138  K 5.7* 5.3 3.8 3.5* 3.6*  CL 96 92* 98 96 97  CO2 22 23 27 28 28   GLUCOSE 184* 167* 112* 98 114*  BUN 9 7 10 7  4*  CREATININE 0.54 0.56 0.67 0.59 0.72  CALCIUM 8.9 9.2 8.7 8.7 9.1  MG  --  2.0  --   --   --   PHOS  --  2.7  --   --   --     Liver Function Tests:  Recent Labs Lab 05/09/14 1255 05/11/14 0839 05/12/14 0505 05/13/14 0504 05/14/14 0510  AST 80* 50* 11 10 11   ALT 50 30 20 15 14   ALKPHOS 52 54 51 61 50  BILITOT 0.3 0.4 0.4 0.3 0.2*  PROT 7.8 8.0 6.9 6.9 7.1  ALBUMIN 3.6 2.9* 2.5* 2.5* 2.7*    Recent Labs Lab 05/09/14 1255 05/11/14 0839 05/12/14 0505 05/14/14 0510  LIPASE 163* 36 25 27   No results found for this basename: AMMONIA,  in the last 168 hours CBC:  Recent Labs Lab 05/09/14 1255 05/11/14 0839 05/12/14 0505 05/13/14 0504 05/14/14 0510  WBC 8.5 9.9 7.9 6.0 4.1  NEUTROABS 6.6 8.0*  --   --   --   HGB 15.1 13.3 11.4* 10.8* 11.7*  HCT 42.2 38.4* 33.1* 32.3* 35.4*  MCV 87.6 88.1 91.4 91.5 92.4  PLT 195 205 168 214 208   CBG:  Recent Labs Lab 05/13/14 1149 05/13/14 1639 05/13/14 2112 05/14/14 0749 05/14/14 1203  GLUCAP 111* 134* 149* 118* 111*    Recent Results (from the past 240 hour(s))  CULTURE, BLOOD (ROUTINE X 2)     Status: None   Collection Time    05/11/14 11:55 AM      Result Value Ref Range Status   Specimen Description BLOOD RIGHT ANTECUBITAL   Final   Special Requests BOTTLES DRAWN AEROBIC AND ANAEROBIC 4.5ML   Final   Culture  Setup Time     Final   Value: 05/11/2014 14:29     Performed at [facility]   Culture     Final   Value:        BLOOD CULTURE RECEIVED NO GROWTH TO DATE CULTURE WILL BE HELD FOR 5 DAYS BEFORE ISSUING A FINAL NEGATIVE REPORT     Performed at [facility]   Report Status PENDING   Incomplete  CULTURE, BLOOD (ROUTINE X 2)     Status: None   Collection Time    05/11/14 11:57 AM      Result Value Ref Range Status   Specimen Description BLOOD RIGHT HAND   Final   Special Requests BOTTLES DRAWN AEROBIC AND ANAEROBIC 4ML   Final   Culture  Setup Time     Final   Value: 05/11/2014 14:29     Performed at [facility]   Culture     Final   Value:        BLOOD CULTURE RECEIVED NO GROWTH TO DATE CULTURE WILL BE  HELD FOR 5 DAYS BEFORE ISSUING A FINAL NEGATIVE REPORT     Performed at [facility]   Report  Status PENDING   Incomplete     Scheduled Meds: . antiseptic oral rinse  7 mL Mouth Rinse BID  . asenapine  10 mg Sublingual BID  . divalproex  2,000 mg Oral QHS  . enoxaparin (LOVENOX) injection  60 mg Subcutaneous Q24H  . FLUoxetine  40 mg Oral Daily  . insulin aspart  0-9 Units Subcutaneous TID WC  . piperacillin-tazobactam (ZOSYN)  IV  3.375 g Intravenous Q8H  . polyethylene glycol  17 g Oral Daily  . risperidone  4 mg Oral QHS  . sennosides  5 mL Oral BID   Continuous Infusions: . sodium chloride 75 mL/hr at 05/13/14 2329

## 2014-05-14 NOTE — Care Management Note (Signed)
    Page 1 of 1   05/14/2014     3:50:31 PM CARE MANAGEMENT NOTE 05/14/2014  Patient:  James [name], James [name]   Account Number:  [id number]  Date Initiated:  05/14/2014  Documentation initiated by:  Dessa Phi  Subjective/Objective Assessment:   38 Y/O M ADMITTED W/PANCREATITIS.     Action/Plan:   FROM HOME.   Anticipated DC Date:  05/16/2014   Anticipated DC Plan:  [place]  CM consult      Choice offered to / List presented to:             Status of service:  In process, will continue to follow Medicare Important Message given?  YES (If response is "NO", the following Medicare IM given date fields will be blank) Date Medicare IM given:  05/14/2014 Medicare IM given by:  [facility] Date Additional Medicare IM given:   Additional Medicare IM given by:    Discharge Disposition:    Per UR Regulation:  Reviewed for med. necessity/level of care/duration of stay  If discussed at [place] of Stay Meetings, dates discussed:    Comments:  05/14/14 Laquon Emel RN,BSN NCM [phone] IV ABX,IV [email or web address] ANTICIPATED D/C NEEDS.

## 2014-05-15 DIAGNOSIS — E876 Hypokalemia: Secondary | ICD-10-CM

## 2014-05-15 DIAGNOSIS — D638 Anemia in other chronic diseases classified elsewhere: Secondary | ICD-10-CM

## 2014-05-15 DIAGNOSIS — E119 Type 2 diabetes mellitus without complications: Secondary | ICD-10-CM

## 2014-05-15 LAB — BASIC METABOLIC PANEL
ANION GAP: 13 (ref 5–15)
BUN: 6 mg/dL (ref 6–23)
CHLORIDE: 99 meq/L (ref 96–112)
CO2: 28 mEq/L (ref 19–32)
Calcium: 9.1 mg/dL (ref 8.4–10.5)
Creatinine, Ser: 0.75 mg/dL (ref 0.50–1.35)
GFR calc Af Amer: >90 mL/min (ref >90)
GFR calc non Af Amer: >90 mL/min (ref >90)
GLUCOSE: 134 mg/dL — AB (ref 70–99)
POTASSIUM: 3.2 meq/L — AB (ref 3.7–5.3)
SODIUM: 140 meq/L (ref 137–147)

## 2014-05-15 LAB — LIPASE, BLOOD: Lipase: 32 U/L (ref 11–59)

## 2014-05-15 LAB — GLUCOSE, CAPILLARY
GLUCOSE-CAPILLARY: 169 mg/dL — AB (ref 70–99)
Glucose-Capillary: 127 mg/dL — ABNORMAL HIGH (ref 70–99)
Glucose-Capillary: 131 mg/dL — ABNORMAL HIGH (ref 70–99)
Glucose-Capillary: 159 mg/dL — ABNORMAL HIGH (ref 70–99)

## 2014-05-15 MED ORDER — POTASSIUM CHLORIDE CRYS ER 20 MEQ PO TBCR
40.0000 meq | EXTENDED_RELEASE_TABLET | Freq: Once | ORAL | Status: AC
  Administered 2014-05-15: 40 meq via ORAL
  Filled 2014-05-15: qty 2

## 2014-05-15 NOTE — Progress Notes (Signed)
Patient ID: James [name], male   DOB: [birth year], 37 y.o.   MRN: [id number] [facility] PROGRESS NOTE  James [name] [id number] DOB: [birth year] DOA: 05/11/2014 PCP: Martinique, BETTY G, MD  Brief narrative: 38 yo male who presented to [facility] ED 05/11/2014 with main concern of 3 days duration of progressively worsening left upper quadrant abdominal pain, sharp and radiating to the back, 7/10 in intensity and intermittent, associated with nausea and poor oral intake. Pt denied any specific alleviating factors, no fevers, chills, no similar events in the past.  In ED, pt noted to be hemodynamically stable, NAD, T = 99.6, CT abd worrisome for acute pancreatitis. Pt started on Zosyn in ED and [facility] asked to admit for further evaluation.   Assessment and Plan:   Principal Problem: Acute pancreatitis   Clinically improving; most recently had abdominal x ray which showed non obstructive bowel gas pattern without evidence of obstruction or free air.  Diet advanced to as tolerated.  May continue IV fluids until PO intake better.  Continue zosyn every 8 hours  Lipase level normalized  Active Problems: Hyponatremia   - possibly from pre renal etiology   Resolved with IV fluids  Hypokalemia   Secondary to GI losses  repleted  Follow up BMP in am Type II diabetes   A1C 7.1 indicating good glycemic control  CBG's in apst 24 hours: 188, 127, 13  Continue SSI for now. Metformin on discharge. Transaminitis   Pt denies alcohol use  LFT's all WNL at this time  Acute blood loss anemia   Likely dilutional as no active bleeding reported per pt   Hemoglobin is 10.8  No current indications for transfusion DVT prophylaxis  Lovenox SQ while pt is in hospital   Code Status: Full  Family Communication: Pt at bedside  Disposition Plan: Home when medically stable    IV Access:   Peripheral IV Procedures and diagnostic studies:   Ct Abdomen Pelvis W Contrast 05/11/2014 In  addition to slight increase in the volume of a small pleural effusion, there has been the development of significant inflammatory change in left upper quadrant. Acute pancreatitis is favored, with no evidence of pancreatic necrosis or pseudocyst. Inflammation originating in the stomach or left colon are also considerations, although there is no evidence of free air to suggest perforation of viscus.  Medical Consultants:   None  Other Consultants:   None  Anti-Infectives:   Zosyn 8/28 -->   Faye Ramsay, MD  [facility] Pager [phone]  If 7PM-7AM, please contact night-coverage [email or web address] Password [facility] 05/15/2014, 3:54 PM   LOS: 4 days   HPI/Subjective: No events overnight.   Objective: Filed Vitals:   05/14/14 1400 05/14/14 2157 05/15/14 0629 05/15/14 1400  BP: 158/97 150/90 160/89 157/97  Pulse: 70 68 66 71  Temp: 98.1 F (36.7 C) 98.5 F (36.9 C) 98.2 F (36.8 C) 98 F (36.7 C)  TempSrc: Oral Oral Oral Oral  Resp: 16 16 18 18   Height:      Weight:      SpO2: 94% 94% 93% 95%    Intake/Output Summary (Last 24 hours) at 05/15/14 1554 Last data filed at 05/15/14 1400  Gross per 24 hour  Intake 2792.5 ml  Output   3550 ml  Net -757.5 ml    Exam:   General:  Pt is alert, follows commands appropriately, not in acute distress  Cardiovascular: Regular rate and rhythm, S1/S2 appreciated   Respiratory: Clear to auscultation bilaterally, no wheezing, no  crackles, no rhonchi  Abdomen: (+) bowel sounds, mild tender to palpation in mid abdomen  Extremities: No edema, pulses DP and PT palpable bilaterally  Neuro: no focal neurologic deficits   Data Reviewed: Basic Metabolic Panel:  Recent Labs Lab 05/09/14 1255 05/11/14 0839 05/12/14 0505 05/13/14 0504 05/14/14 0510 05/15/14 0453  NA 136* 128* 137 136* 138 140  K 5.7* 5.3 3.8 3.5* 3.6* 3.2*  CL 96 92* 98 96 97 99  CO2 22 23 27 28 28 28   GLUCOSE 184* 167* 112* 98 114* 134*  BUN 9 7 10 7  4* 6  CREATININE  0.54 0.56 0.67 0.59 0.72 0.75  CALCIUM 8.9 9.2 8.7 8.7 9.1 9.1  MG  --  2.0  --   --   --   --   PHOS  --  2.7  --   --   --   --    Liver Function Tests:  Recent Labs Lab 05/09/14 1255 05/11/14 0839 05/12/14 0505 05/13/14 0504 05/14/14 0510  AST 80* 50* 11 10 11   ALT 50 30 20 15 14   ALKPHOS 52 54 51 61 50  BILITOT 0.3 0.4 0.4 0.3 0.2*  PROT 7.8 8.0 6.9 6.9 7.1  ALBUMIN 3.6 2.9* 2.5* 2.5* 2.7*    Recent Labs Lab 05/09/14 1255 05/11/14 0839 05/12/14 0505 05/14/14 0510 05/15/14 0453  LIPASE 163* 36 25 27 32   No results found for this basename: AMMONIA,  in the last 168 hours CBC:  Recent Labs Lab 05/09/14 1255 05/11/14 0839 05/12/14 0505 05/13/14 0504 05/14/14 0510  WBC 8.5 9.9 7.9 6.0 4.1  NEUTROABS 6.6 8.0*  --   --   --   HGB 15.1 13.3 11.4* 10.8* 11.7*  HCT 42.2 38.4* 33.1* 32.3* 35.4*  MCV 87.6 88.1 91.4 91.5 92.4  PLT 195 205 168 214 208   Cardiac Enzymes: No results found for this basename: CKTOTAL, CKMB, CKMBINDEX, TROPONINI,  in the last 168 hours BNP: No components found with this basename: POCBNP,  CBG:  Recent Labs Lab 05/14/14 1203 05/14/14 1731 05/14/14 2143 05/15/14 0748 05/15/14 1219  GLUCAP 111* 130* 188* 127* 131*    Recent Results (from the past 240 hour(s))  CULTURE, BLOOD (ROUTINE X 2)     Status: None   Collection Time    05/11/14 11:55 AM      Result Value Ref Range Status   Specimen Description BLOOD RIGHT ANTECUBITAL   Final   Value:        BLOOD CULTURE RECEIVED NO GROWTH TO DATE CULTURE WILL BE HELD FOR 5 DAYS BEFORE ISSUING A FINAL NEGATIVE REPORT     Performed at [facility]   Report Status PENDING   Incomplete  CULTURE, BLOOD (ROUTINE X 2)     Status: None   Collection Time    05/11/14 11:57 AM      Result Value Ref Range Status   Specimen Description BLOOD RIGHT HAND   Final   Value:        BLOOD CULTURE RECEIVED NO GROWTH TO DATE CULTURE WILL BE HELD FOR 5 DAYS BEFORE ISSUING A FINAL NEGATIVE REPORT      Performed at [facility]   Report Status PENDING   Incomplete     Scheduled Meds: . asenapine  10 mg Sublingual BID  . divalproex  2,000 mg Oral QHS  . enoxaparin (LOVENOX) injection  60 mg Subcutaneous Q24H  . FLUoxetine  40 mg Oral Daily  . insulin aspart  0-9 Units Subcutaneous TID WC  . piperacillin-tazobactam   3.375 g Intravenous Q8H  . polyethylene glycol  17 g Oral Daily  . potassium chloride  40 mEq Oral Once  . risperidone  4 mg Oral QHS  . sennosides  5 mL Oral BID   Continuous Infusions: . sodium chloride 75 mL/hr at 05/15/14 0506

## 2014-05-16 LAB — COMPREHENSIVE METABOLIC PANEL
ALBUMIN: 3 g/dL — AB (ref 3.5–5.2)
ALT: 18 U/L (ref 0–53)
ANION GAP: 15 (ref 5–15)
AST: 15 U/L (ref 0–37)
Alkaline Phosphatase: 51 U/L (ref 39–117)
BUN: 6 mg/dL (ref 6–23)
CO2: 25 mEq/L (ref 19–32)
CREATININE: 0.57 mg/dL (ref 0.50–1.35)
Calcium: 9.1 mg/dL (ref 8.4–10.5)
Chloride: 100 mEq/L (ref 96–112)
GFR calc Af Amer: >90 mL/min (ref >90)
GFR calc non Af Amer: >90 mL/min (ref >90)
Glucose, Bld: 130 mg/dL — ABNORMAL HIGH (ref 70–99)
Potassium: 3.8 mEq/L (ref 3.7–5.3)
Sodium: 140 mEq/L (ref 137–147)
Total Protein: 7.6 g/dL (ref 6.0–8.3)

## 2014-05-16 LAB — CBC
HCT: 39.9 % (ref 39.0–52.0)
Hemoglobin: 13.3 g/dL (ref 13.0–17.0)
MCH: 30.2 pg (ref 26.0–34.0)
MCHC: 33.3 g/dL (ref 30.0–36.0)
MCV: 90.7 fL (ref 78.0–100.0)
Platelets: 261 10*3/uL (ref 150–400)
RBC: 4.4 MIL/uL (ref 4.22–5.81)
RDW: 13.2 % (ref 11.5–15.5)
WBC: 4.8 10*3/uL (ref 4.0–10.5)

## 2014-05-16 LAB — GLUCOSE, CAPILLARY: Glucose-Capillary: 115 mg/dL — ABNORMAL HIGH (ref 70–99)

## 2014-05-16 MED ORDER — ONDANSETRON HCL 4 MG PO TABS: 4.0000 mg | ORAL_TABLET | Freq: Four times a day (QID) | ORAL | Status: DC | PRN

## 2014-05-16 MED ORDER — HYDROMORPHONE HCL 4 MG PO TABS: 4.0000 mg | ORAL_TABLET | ORAL | Status: DC | PRN

## 2014-05-16 NOTE — Discharge Summary (Signed)
Physician Discharge Summary  James [name] [id number] DOB: Apr 26, 1976 DOA: 05/11/2014  PCP: Martinique, BETTY G, MD  Admit date: 05/11/2014 Discharge date: 05/16/2014  Recommendations for Outpatient Follow-up:  1. Pt will need to follow up with PCP in 2-3 weeks post discharge 2. Please obtain BMP to evaluate electrolytes and kidney function 3. Please also check CBC to evaluate Hg and Hct levels 4. Discussed metformin on discharge but pt wants to try dietary restrictions and exercise and will discuss with PCP  Discharge Diagnoses: Acute pancreatitis  Active Problems:   Pancreatitis   Acute pancreatitis   Discharge Condition: Stable  Diet recommendation: Heart healthy diet discussed in details   Brief narrative:  38 yo male who presented to [facility] ED 05/11/2014 with main concern of 3 days duration of progressively worsening left upper quadrant abdominal pain, sharp and radiating to the back, 7/10 in intensity and intermittent, associated with nausea and poor oral intake. Pt denied any specific alleviating factors, no fevers, chills, no similar events in the past.  In ED, pt noted to be hemodynamically stable, NAD, T = 99.6, CT abd worrisome for acute pancreatitis. Pt started on Zosyn in ED and [facility] asked to admit for further evaluation.   Assessment and Plan:  Principal Problem:  Acute pancreatitis  Clinically improving; most recently had abdominal x ray which showed non obstructive bowel gas pattern without evidence of obstruction or free air.  Diet advanced to regular and pt tolerating well  Lipase level normalized Pt wants to go home today  Active Problems:  Hyponatremia  Possibly from pre renal etiology  Resolved with IV fluids  Hypokalemia  Secondary to GI losses  repleted  Type II diabetes  A1C 7.1 indicating good glycemic control  CBG's in apst 24 hours: 188, 127, 130 Discussed metformin on discharge but pt wants to try dietary restrictions ans exercise and will discuss  with PCP Transaminitis  Pt denies alcohol use  LFT's all WNL at this time  Acute blood loss anemia  Likely dilutional as no active bleeding reported per pt  Hemoglobin is 10.8  No current indications for transfusion  Code Status: Full  Family Communication: Pt and mother at bedside   IV Access:   Peripheral IV Procedures and diagnostic studies:   Ct Abdomen Pelvis W Contrast 05/11/2014 In addition to slight increase in the volume of a small pleural effusion, there has been the development of significant inflammatory change in left upper quadrant. Acute pancreatitis is favored, with no evidence of pancreatic necrosis or pseudocyst. Inflammation originating in the stomach or left colon are also considerations, although there is no evidence of free air to suggest perforation of viscus.  Medical Consultants:   None  Other Consultants:   None  Anti-Infectives:   Zosyn 8/28 --> 9/2  Discharge Exam: Filed Vitals:   05/16/14 0516  BP: 156/90  Pulse: 55  Temp: 97.6 F (36.4 C)  Resp: 16   Filed Vitals:   05/15/14 0629 05/15/14 1400 05/15/14 2111 05/16/14 0516  BP: 160/89 157/97 157/102 156/90  Pulse: 66 71 60 55  Temp: 98.2 F (36.8 C) 98 F (36.7 C) 98 F (36.7 C) 97.6 F (36.4 C)  TempSrc: Oral Oral Oral Oral  Resp: 18 18 16 16   Height:      Weight:      SpO2: 93% 95% 96% 95%    General: Pt is alert, follows commands appropriately, not in acute distress Cardiovascular: Regular rate and rhythm, S1/S2 +, no murmurs, no  rubs, no gallops Respiratory: Clear to auscultation bilaterally, no wheezing, no crackles, no rhonchi Abdominal: Soft, non tender, non distended, bowel sounds +, no guarding Extremities: no edema, no cyanosis, pulses palpable bilaterally DP and PT Neuro: Grossly nonfocal  Discharge Instructions  Discharge Instructions   Diet - low sodium heart healthy    Complete by:  As directed      Increase activity slowly    Complete by:  As directed              Medication List    STOP taking these medications       HYDROcodone-acetaminophen 5-325 MG per tablet  Commonly known as:  NORCO/VICODIN      TAKE these medications       divalproex 500 MG 24 hr tablet  Commonly known as:  DEPAKOTE ER  Take 2,000 mg by mouth at bedtime.     FLUoxetine 20 MG capsule  Commonly known as:  PROZAC  Take 40 mg by mouth daily.     HYDROmorphone 4 MG tablet  Commonly known as:  DILAUDID  Take 1 tablet (4 mg total) by mouth every 4 (four) hours as needed for severe pain.     omeprazole 20 MG tablet  Commonly known as:  PRILOSEC OTC  Take 20 mg by mouth daily.     ondansetron 4 MG tablet  Commonly known as:  ZOFRAN  Take 1 tablet (4 mg total) by mouth every 6 (six) hours as needed for nausea.     risperidone 4 MG tablet  Commonly known as:  RISPERDAL  Take 4 mg by mouth at bedtime.     SAPHRIS 5 MG Subl 24 hr tablet  Generic drug:  asenapine  Place 10 mg under the tongue 2 (two) times daily.           Follow-up Information   Schedule an appointment as soon as possible for a visit with Martinique, BETTY G, MD.   Specialty:  Family Medicine   Contact information:   [address] [place] [address] [phone]       Follow up with Faye Ramsay, MD. (As needed call my cell phone [phone])    Specialty:  Internal Medicine   Contact information:   [address] [place] [address] [phone]        The results of significant diagnostics from this hospitalization (including imaging, microbiology, ancillary and laboratory) are listed below for reference.     Microbiology: Recent Results (from the past 240 hour(s))  CULTURE, BLOOD (ROUTINE X 2)     Status: None   Collection Time    05/11/14 11:55 AM      Result Value Ref Range Status   Specimen Description BLOOD RIGHT ANTECUBITAL   Final   Special Requests BOTTLES DRAWN AEROBIC AND ANAEROBIC 4.5ML   Final   Culture  Setup Time      Final   Value: 05/11/2014 14:29     Performed at [facility]   Culture     Final   Value:        BLOOD CULTURE RECEIVED NO GROWTH TO DATE CULTURE WILL BE HELD FOR 5 DAYS BEFORE ISSUING A FINAL NEGATIVE REPORT     Performed at [facility]   Report Status PENDING   Incomplete  CULTURE, BLOOD (ROUTINE X 2)     Status: None   Collection Time    05/11/14 11:57 AM  Result Value Ref Range Status   Specimen Description BLOOD RIGHT HAND   Final   Special Requests BOTTLES DRAWN AEROBIC AND ANAEROBIC 4ML   Final   Culture  Setup Time     Final   Value: 05/11/2014 14:29     Performed at [facility]   Culture     Final   Value:        BLOOD CULTURE RECEIVED NO GROWTH TO DATE CULTURE WILL BE HELD FOR 5 DAYS BEFORE ISSUING A FINAL NEGATIVE REPORT     Performed at [facility]   Report Status PENDING   Incomplete     Labs: Basic Metabolic Panel:  Recent Labs Lab 05/09/14 1255 05/11/14 0839 05/12/14 0505 05/13/14 0504 05/14/14 0510 05/15/14 0453 05/16/14 0515  NA 136* 128* 137 136* 138 140 140  K 5.7* 5.3 3.8 3.5* 3.6* 3.2* 3.8  CL 96 92* 98 96 97 99 100  CO2 22 23 27 28 28 28 25   GLUCOSE 184* 167* 112* 98 114* 134* 130*  BUN 9 7 10 7  4* 6 6  CREATININE 0.54 0.56 0.67 0.59 0.72 0.75 0.57  CALCIUM 8.9 9.2 8.7 8.7 9.1 9.1 9.1  MG  --  2.0  --   --   --   --   --   PHOS  --  2.7  --   --   --   --   --    Liver Function Tests:  Recent Labs Lab 05/11/14 0839 05/12/14 0505 05/13/14 0504 05/14/14 0510 05/16/14 0515  AST 50* 11 10 11 15   ALT 30 20 15 14 18   ALKPHOS 54 51 61 50 51  BILITOT 0.4 0.4 0.3 0.2* <0.2*  PROT 8.0 6.9 6.9 7.1 7.6  ALBUMIN 2.9* 2.5* 2.5* 2.7* 3.0*    Recent Labs Lab 05/09/14 1255 05/11/14 0839 05/12/14 0505 05/14/14 0510 05/15/14 0453  LIPASE 163* 36 25 27 32   No results found for this basename: AMMONIA,  in the last 168 hours CBC:  Recent Labs Lab 05/09/14 1255 05/11/14 0839 05/12/14 0505  05/13/14 0504 05/14/14 0510 05/16/14 0515  WBC 8.5 9.9 7.9 6.0 4.1 4.8  NEUTROABS 6.6 8.0*  --   --   --   --   HGB 15.1 13.3 11.4* 10.8* 11.7* 13.3  HCT 42.2 38.4* 33.1* 32.3* 35.4* 39.9  MCV 87.6 88.1 91.4 91.5 92.4 90.7  PLT 195 205 168 214 208 261   CBG:  Recent Labs Lab 05/15/14 0748 05/15/14 1219 05/15/14 1628 05/15/14 2109 05/16/14 0753  GLUCAP 127* 131* 159* 169* 115*     SIGNED: Time coordinating discharge: Over 30 minutes  Faye Ramsay, MD  [facility] 05/16/2014, 11:47 AM Pager [phone]  If 7PM-7AM, please contact night-coverage [email or web address] Password [email or web address]

## 2014-05-16 NOTE — Discharge Instructions (Signed)
Acute Pancreatitis °Acute pancreatitis is a disease in which the pancreas becomes suddenly irritated (inflamed). The pancreas is a large gland behind your stomach. The pancreas makes enzymes that help digest food. The pancreas also makes 2 hormones that help control your blood sugar. Acute pancreatitis happens when the enzymes attack and damage the pancreas. Most attacks last a couple of days and can cause serious problems. °HOME CARE °· Follow your doctor's diet instructions. You may need to avoid alcohol and limit fat in your diet. °· Eat small meals often. °· Drink enough fluids to keep your pee (urine) clear or pale yellow. °· Only take medicines as told by your doctor. °· Avoid drinking alcohol if it caused your disease. °· Do not smoke. °· Get plenty of rest. °· Check your blood sugar at home as told by your doctor. °· Keep all doctor visits as told. °GET HELP IF: °· You do not get better as quickly as expected. °· You have new or worsening symptoms. °· You have lasting pain, weakness, or feel sick to your stomach (nauseous). °· You get better and then have another pain attack. °GET HELP RIGHT AWAY IF:  °· You are unable to eat or keep fluids down. °· Your pain becomes severe. °· You have a fever or lasting symptoms for more than 2 to 3 days. °· You have a fever and your symptoms suddenly get worse. °· Your skin or the white part of your eyes turn yellow (jaundice). °· You throw up (vomit). °· You feel dizzy, or you pass out (faint). °· Your blood sugar is high (over 300 mg/dL). °MAKE SURE YOU:  °· Understand these instructions. °· Will watch your condition. °· Will get help right away if you are not doing well or get worse. °Document Released: 02/17/2008 Document Revised: 01/15/2014 Document Reviewed: 12/10/2011 °Exit[employer]® Patient Information ©2015 [employer]. This information is not intended to replace advice given to you by your health care provider. Make sure you discuss any questions you have with your  health care provider. ° °

## 2014-05-17 LAB — CULTURE, BLOOD (ROUTINE X 2)
CULTURE: NO GROWTH
CULTURE: NO GROWTH

## 2014-06-20 DIAGNOSIS — F411 Generalized anxiety disorder: Secondary | ICD-10-CM | POA: Diagnosis not present

## 2014-06-20 DIAGNOSIS — F250 Schizoaffective disorder, bipolar type: Secondary | ICD-10-CM | POA: Diagnosis not present

## 2014-06-21 DIAGNOSIS — F411 Generalized anxiety disorder: Secondary | ICD-10-CM | POA: Diagnosis not present

## 2014-07-04 DIAGNOSIS — F411 Generalized anxiety disorder: Secondary | ICD-10-CM | POA: Diagnosis not present

## 2014-07-04 DIAGNOSIS — F250 Schizoaffective disorder, bipolar type: Secondary | ICD-10-CM | POA: Diagnosis not present

## 2014-07-12 DIAGNOSIS — F411 Generalized anxiety disorder: Secondary | ICD-10-CM | POA: Diagnosis not present

## 2014-07-18 DIAGNOSIS — F411 Generalized anxiety disorder: Secondary | ICD-10-CM | POA: Diagnosis not present

## 2014-07-18 DIAGNOSIS — F250 Schizoaffective disorder, bipolar type: Secondary | ICD-10-CM | POA: Diagnosis not present

## 2014-08-15 DIAGNOSIS — F411 Generalized anxiety disorder: Secondary | ICD-10-CM | POA: Diagnosis not present

## 2014-08-15 DIAGNOSIS — F250 Schizoaffective disorder, bipolar type: Secondary | ICD-10-CM | POA: Diagnosis not present

## 2014-08-19 DIAGNOSIS — R112 Nausea with vomiting, unspecified: Secondary | ICD-10-CM | POA: Diagnosis not present

## 2014-08-19 DIAGNOSIS — Z79899 Other long term (current) drug therapy: Secondary | ICD-10-CM | POA: Diagnosis not present

## 2014-08-19 DIAGNOSIS — R1013 Epigastric pain: Secondary | ICD-10-CM | POA: Diagnosis not present

## 2014-08-19 DIAGNOSIS — F259 Schizoaffective disorder, unspecified: Secondary | ICD-10-CM | POA: Diagnosis not present

## 2014-08-19 DIAGNOSIS — R748 Abnormal levels of other serum enzymes: Secondary | ICD-10-CM | POA: Diagnosis not present

## 2014-08-19 DIAGNOSIS — R509 Fever, unspecified: Secondary | ICD-10-CM | POA: Diagnosis not present

## 2014-08-19 DIAGNOSIS — F17210 Nicotine dependence, cigarettes, uncomplicated: Secondary | ICD-10-CM | POA: Diagnosis not present

## 2014-08-19 DIAGNOSIS — E119 Type 2 diabetes mellitus without complications: Secondary | ICD-10-CM | POA: Diagnosis not present

## 2014-08-19 DIAGNOSIS — E785 Hyperlipidemia, unspecified: Secondary | ICD-10-CM | POA: Diagnosis not present

## 2014-08-19 DIAGNOSIS — K859 Acute pancreatitis, unspecified: Secondary | ICD-10-CM | POA: Diagnosis not present

## 2014-08-19 DIAGNOSIS — E871 Hypo-osmolality and hyponatremia: Secondary | ICD-10-CM | POA: Diagnosis not present

## 2014-08-19 DIAGNOSIS — R1011 Right upper quadrant pain: Secondary | ICD-10-CM | POA: Diagnosis not present

## 2014-08-19 DIAGNOSIS — E876 Hypokalemia: Secondary | ICD-10-CM | POA: Diagnosis not present

## 2014-08-24 DIAGNOSIS — F259 Schizoaffective disorder, unspecified: Secondary | ICD-10-CM | POA: Diagnosis not present

## 2014-08-24 DIAGNOSIS — E1165 Type 2 diabetes mellitus with hyperglycemia: Secondary | ICD-10-CM | POA: Diagnosis not present

## 2014-08-24 DIAGNOSIS — I10 Essential (primary) hypertension: Secondary | ICD-10-CM | POA: Diagnosis not present

## 2014-08-24 DIAGNOSIS — K859 Acute pancreatitis, unspecified: Secondary | ICD-10-CM | POA: Diagnosis not present

## 2014-08-24 DIAGNOSIS — E782 Mixed hyperlipidemia: Secondary | ICD-10-CM | POA: Diagnosis not present

## 2014-08-24 DIAGNOSIS — E781 Pure hyperglyceridemia: Secondary | ICD-10-CM | POA: Diagnosis not present

## 2014-09-05 DIAGNOSIS — F411 Generalized anxiety disorder: Secondary | ICD-10-CM | POA: Diagnosis not present

## 2014-09-05 DIAGNOSIS — F250 Schizoaffective disorder, bipolar type: Secondary | ICD-10-CM | POA: Diagnosis not present

## 2014-10-22 DIAGNOSIS — E781 Pure hyperglyceridemia: Secondary | ICD-10-CM | POA: Diagnosis not present

## 2014-10-22 DIAGNOSIS — E119 Type 2 diabetes mellitus without complications: Secondary | ICD-10-CM | POA: Diagnosis not present

## 2014-10-22 DIAGNOSIS — F319 Bipolar disorder, unspecified: Secondary | ICD-10-CM | POA: Diagnosis not present

## 2014-10-22 DIAGNOSIS — Z1389 Encounter for screening for other disorder: Secondary | ICD-10-CM | POA: Diagnosis not present

## 2014-10-29 DIAGNOSIS — E785 Hyperlipidemia, unspecified: Secondary | ICD-10-CM | POA: Diagnosis not present

## 2014-10-29 DIAGNOSIS — E1121 Type 2 diabetes mellitus with diabetic nephropathy: Secondary | ICD-10-CM | POA: Diagnosis not present

## 2014-10-29 DIAGNOSIS — E1165 Type 2 diabetes mellitus with hyperglycemia: Secondary | ICD-10-CM | POA: Diagnosis not present

## 2014-10-29 DIAGNOSIS — R740 Nonspecific elevation of levels of transaminase and lactic acid dehydrogenase [LDH]: Secondary | ICD-10-CM | POA: Diagnosis not present

## 2014-10-30 DIAGNOSIS — E1165 Type 2 diabetes mellitus with hyperglycemia: Secondary | ICD-10-CM | POA: Diagnosis not present

## 2014-10-30 DIAGNOSIS — Z6834 Body mass index (BMI) 34.0-34.9, adult: Secondary | ICD-10-CM | POA: Diagnosis not present

## 2014-10-30 DIAGNOSIS — E1121 Type 2 diabetes mellitus with diabetic nephropathy: Secondary | ICD-10-CM | POA: Diagnosis not present

## 2014-12-11 DIAGNOSIS — F310 Bipolar disorder, current episode hypomanic: Secondary | ICD-10-CM | POA: Diagnosis not present

## 2014-12-31 DIAGNOSIS — E119 Type 2 diabetes mellitus without complications: Secondary | ICD-10-CM | POA: Diagnosis not present

## 2014-12-31 DIAGNOSIS — I10 Essential (primary) hypertension: Secondary | ICD-10-CM | POA: Diagnosis not present

## 2014-12-31 DIAGNOSIS — L03115 Cellulitis of right lower limb: Secondary | ICD-10-CM | POA: Diagnosis not present

## 2014-12-31 DIAGNOSIS — Z87891 Personal history of nicotine dependence: Secondary | ICD-10-CM | POA: Diagnosis not present

## 2014-12-31 DIAGNOSIS — M79604 Pain in right leg: Secondary | ICD-10-CM | POA: Diagnosis not present

## 2015-01-02 DIAGNOSIS — F310 Bipolar disorder, current episode hypomanic: Secondary | ICD-10-CM | POA: Diagnosis not present

## 2015-01-24 DIAGNOSIS — F310 Bipolar disorder, current episode hypomanic: Secondary | ICD-10-CM | POA: Diagnosis not present

## 2015-02-17 IMAGING — CT CT ABD-PELV W/ CM
2 of 5 series · 17 of 46 positions shown, 19 images · IV contrast (OMNIPAQUE)
Comparison: 10/25/2012

CLINICAL DATA: RIGHT lower extremity swelling

EXAM:
CT ABDOMEN AND PELVIS WITH CONTRAST
TECHNIQUE: Multidetector CT imaging of the abdomen and pelvis was performed
using the standard protocol following bolus administration of
intravenous contrast. Sagittal and coronal MPR images reconstructed
from axial data set.
CONTRAST:  125mL OMNIPAQUE IOHEXOL 300 MG/ML SOLN IV. Dilute oral
contrast.

[Series 2: rtn a/p with · axial · 0.98mm/px · z∈[+651,+1116]mm · 14 of 105 slices shown, 16 images]
[im 6/105  soft-tissue]
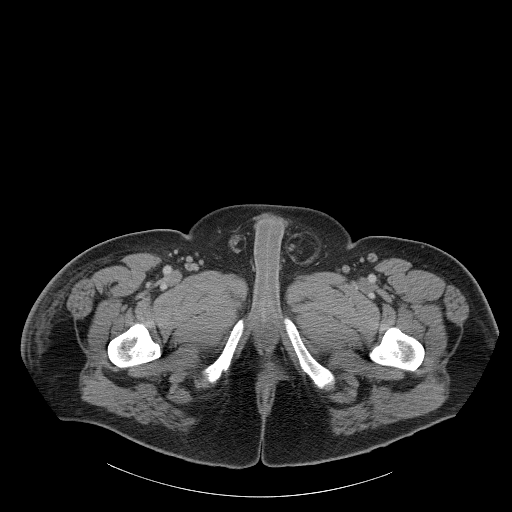
[im 6/105  bone]
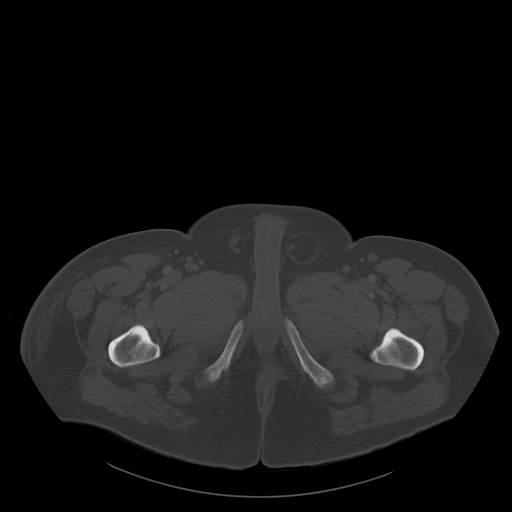
[im 16/105  soft-tissue]
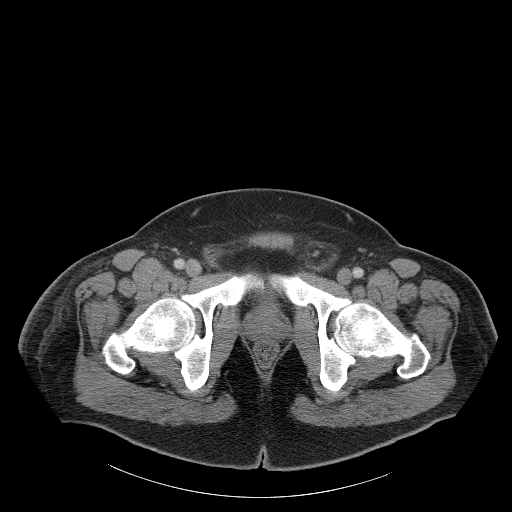
[im 21/105  soft-tissue]
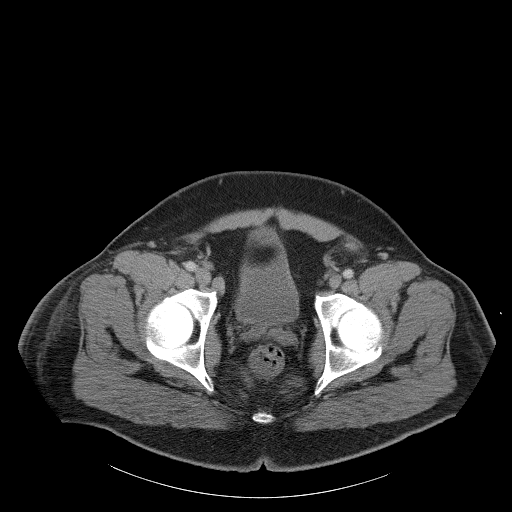
[im 27/105  soft-tissue]
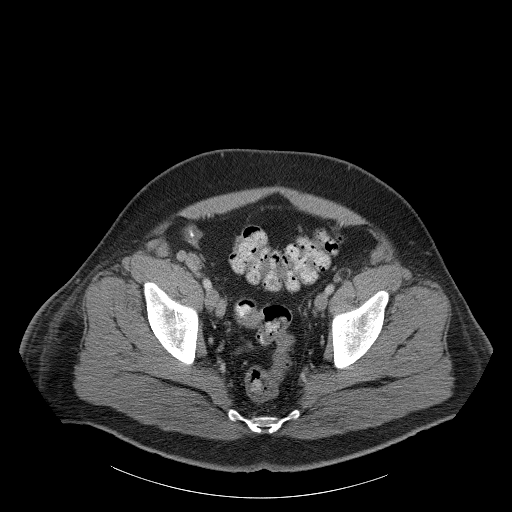
[im 37/105  soft-tissue]
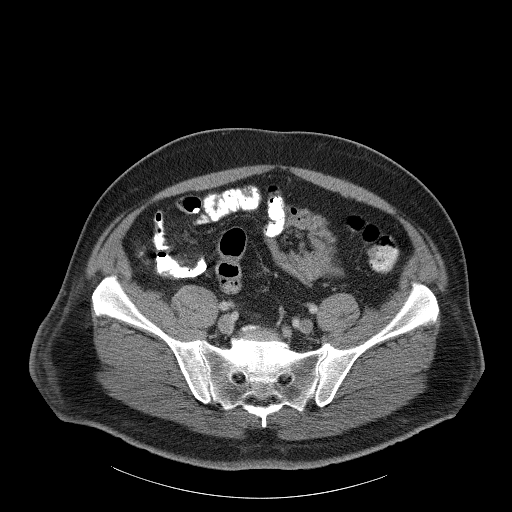
[im 42/105  soft-tissue]
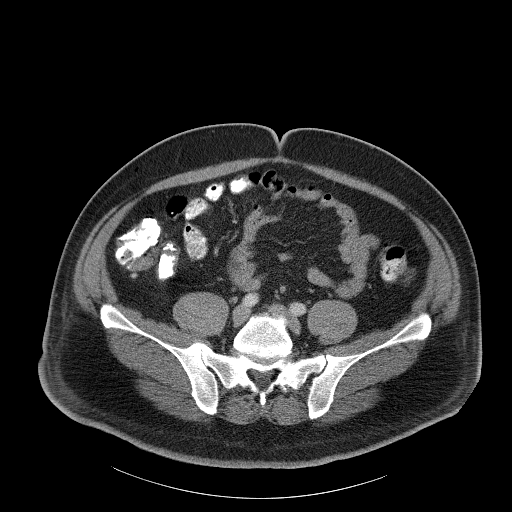
[im 47/105  soft-tissue]
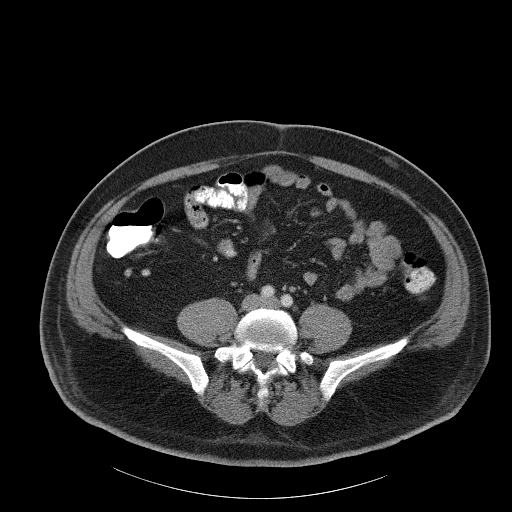
[im 58/105  soft-tissue]
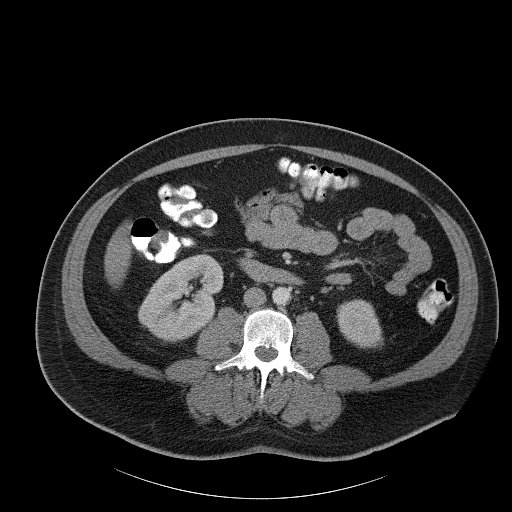
[im 63/105  soft-tissue]
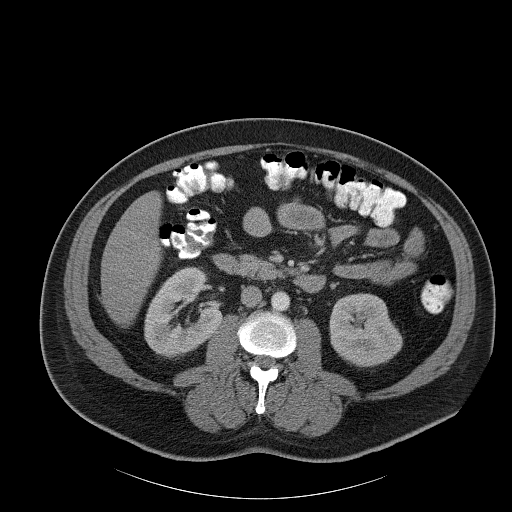
[im 63/105  bone]
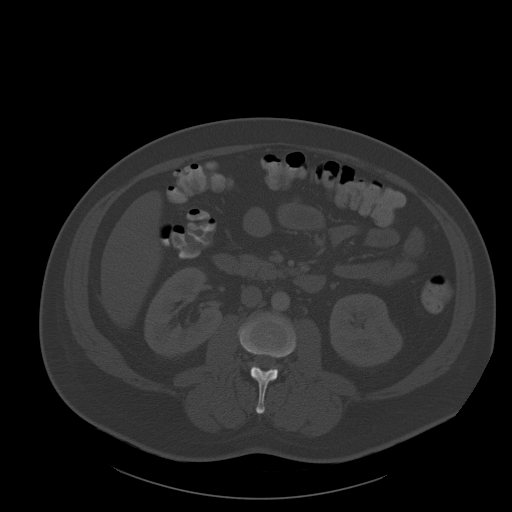
[im 68/105  soft-tissue]
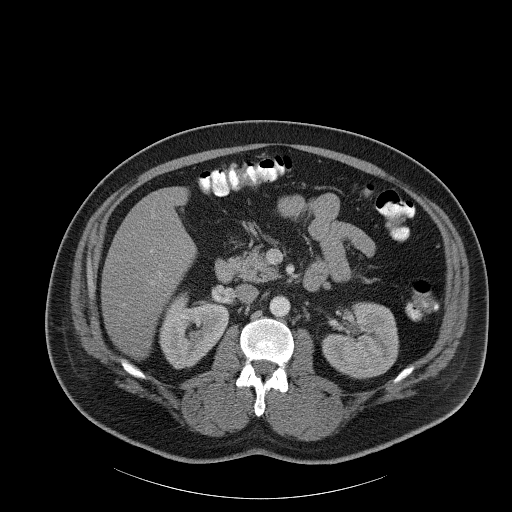
[im 79/105  soft-tissue]
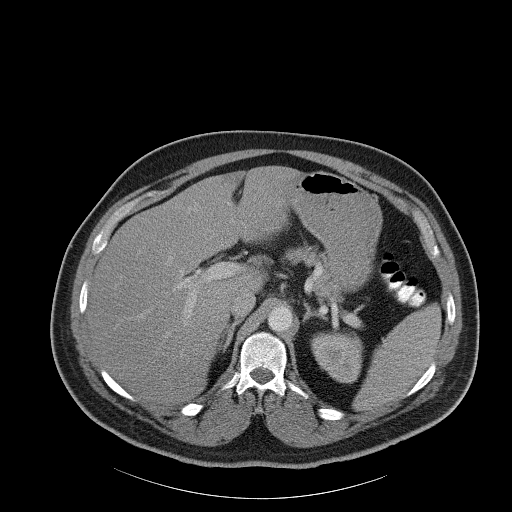
[im 84/105  soft-tissue]
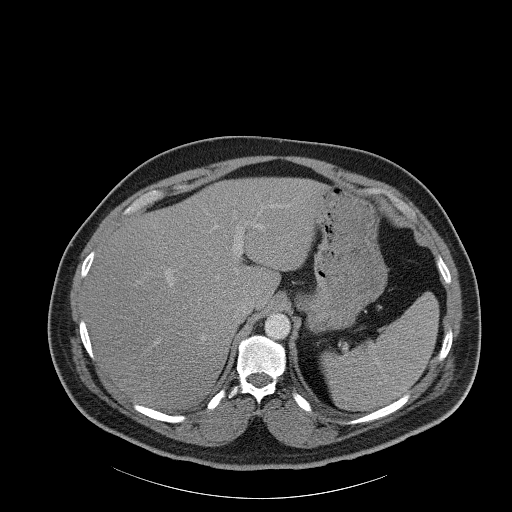
[im 89/105  soft-tissue]
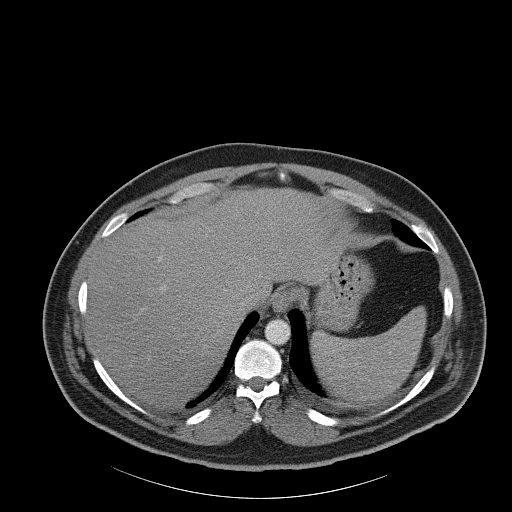
[im 99/105  soft-tissue]
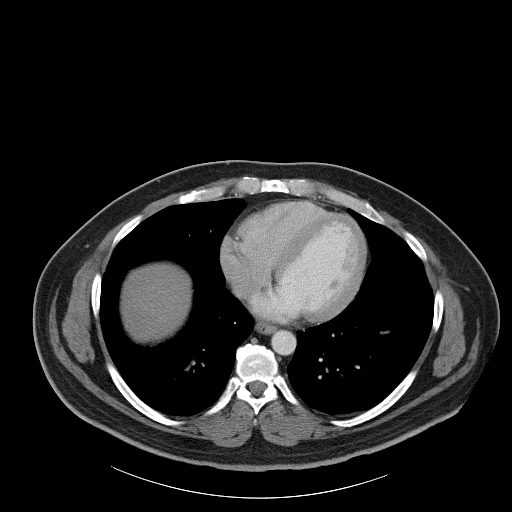

[Series 602: <mpr thick range> · coronal · 1.03mm/px · 3 of 108 slices shown]
[im 36/108  soft-tissue]
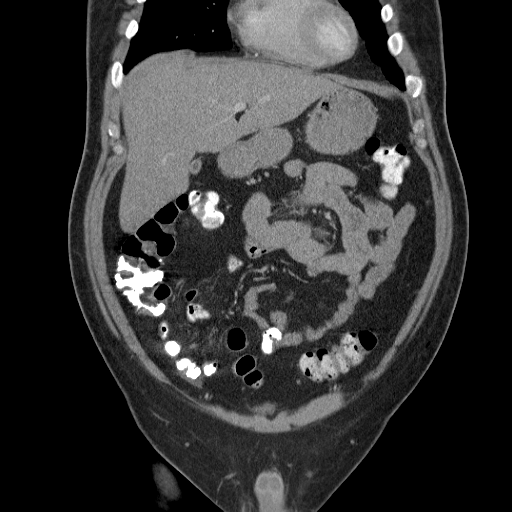
[im 48/108  soft-tissue]
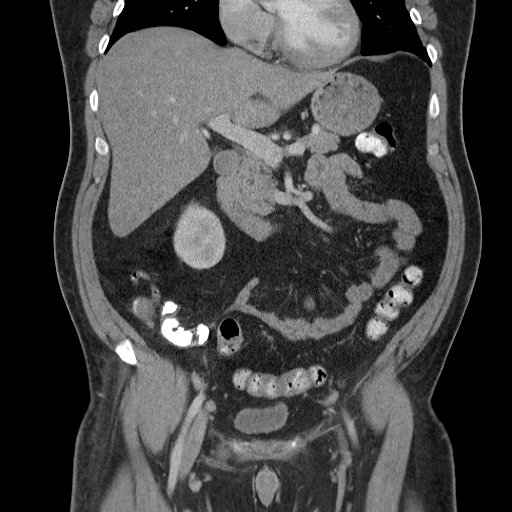
[im 60/108  soft-tissue]
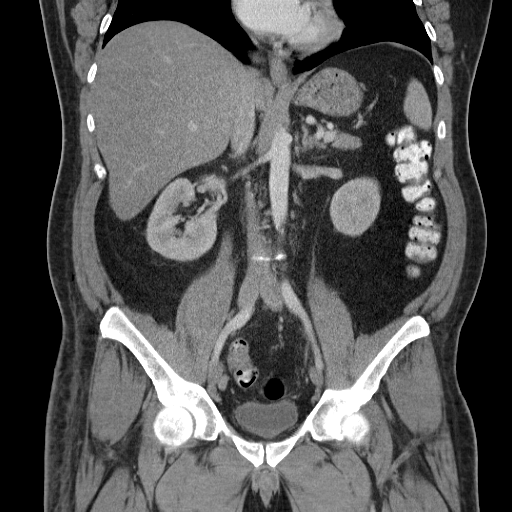

[17 of 46 positions shown; findings below may reference images not displayed]

FINDINGS: Tiny pleural effusions bilaterally with minimal dependent
atelectasis LEFT lower lobe.

Liver, spleen, pancreas, kidneys, and adrenal glands normal
appearance.

LEFT inguinal hernia containing fat.

Stomach and bowel loops normal appearance.

Bladder and ureters normal appearance.

Normal appendix.

No mass, adenopathy, free fluid, or free air.

BILATERAL spondylolysis L5 without spondylolisthesis.

Degenerative disc disease changes L5-S1.
IMPRESSION: Tiny BILATERAL pleural effusions and minimal dependent atelectasis
LEFT lower lobe.

LEFT inguinal hernia containing fat.

BILATERAL spondylolysis L5 with degenerative disc disease changes at
L5-S1.

No acute intra-abdominal or intrapelvic process identified.

## 2015-03-08 DIAGNOSIS — R079 Chest pain, unspecified: Secondary | ICD-10-CM | POA: Diagnosis not present

## 2015-03-08 DIAGNOSIS — R0789 Other chest pain: Secondary | ICD-10-CM | POA: Diagnosis not present

## 2015-03-08 DIAGNOSIS — R0602 Shortness of breath: Secondary | ICD-10-CM | POA: Diagnosis not present

## 2015-03-11 DIAGNOSIS — F310 Bipolar disorder, current episode hypomanic: Secondary | ICD-10-CM | POA: Diagnosis not present

## 2015-04-18 DIAGNOSIS — E1165 Type 2 diabetes mellitus with hyperglycemia: Secondary | ICD-10-CM | POA: Diagnosis not present

## 2015-04-18 DIAGNOSIS — E1121 Type 2 diabetes mellitus with diabetic nephropathy: Secondary | ICD-10-CM | POA: Diagnosis not present

## 2015-04-18 DIAGNOSIS — I10 Essential (primary) hypertension: Secondary | ICD-10-CM | POA: Diagnosis not present

## 2015-04-18 DIAGNOSIS — E785 Hyperlipidemia, unspecified: Secondary | ICD-10-CM | POA: Diagnosis not present

## 2015-04-23 DIAGNOSIS — F310 Bipolar disorder, current episode hypomanic: Secondary | ICD-10-CM | POA: Diagnosis not present

## 2015-05-29 DIAGNOSIS — M25561 Pain in right knee: Secondary | ICD-10-CM | POA: Diagnosis not present

## 2015-07-24 DIAGNOSIS — F310 Bipolar disorder, current episode hypomanic: Secondary | ICD-10-CM | POA: Diagnosis not present

## 2015-08-19 DIAGNOSIS — E785 Hyperlipidemia, unspecified: Secondary | ICD-10-CM | POA: Diagnosis not present

## 2015-08-19 DIAGNOSIS — I10 Essential (primary) hypertension: Secondary | ICD-10-CM | POA: Diagnosis not present

## 2015-08-19 DIAGNOSIS — E119 Type 2 diabetes mellitus without complications: Secondary | ICD-10-CM | POA: Diagnosis not present

## 2015-08-19 DIAGNOSIS — Z23 Encounter for immunization: Secondary | ICD-10-CM | POA: Diagnosis not present

## 2015-10-21 DIAGNOSIS — F315 Bipolar disorder, current episode depressed, severe, with psychotic features: Secondary | ICD-10-CM | POA: Diagnosis not present

## 2015-12-18 DIAGNOSIS — I10 Essential (primary) hypertension: Secondary | ICD-10-CM | POA: Diagnosis not present

## 2015-12-18 DIAGNOSIS — E1121 Type 2 diabetes mellitus with diabetic nephropathy: Secondary | ICD-10-CM | POA: Diagnosis not present

## 2015-12-18 DIAGNOSIS — E785 Hyperlipidemia, unspecified: Secondary | ICD-10-CM | POA: Diagnosis not present

## 2015-12-18 DIAGNOSIS — E1165 Type 2 diabetes mellitus with hyperglycemia: Secondary | ICD-10-CM | POA: Diagnosis not present

## 2015-12-25 DIAGNOSIS — E1165 Type 2 diabetes mellitus with hyperglycemia: Secondary | ICD-10-CM | POA: Diagnosis not present

## 2015-12-25 DIAGNOSIS — E785 Hyperlipidemia, unspecified: Secondary | ICD-10-CM | POA: Diagnosis not present

## 2015-12-25 DIAGNOSIS — Z139 Encounter for screening, unspecified: Secondary | ICD-10-CM | POA: Diagnosis not present

## 2015-12-25 DIAGNOSIS — I10 Essential (primary) hypertension: Secondary | ICD-10-CM | POA: Diagnosis not present

## 2015-12-25 DIAGNOSIS — E663 Overweight: Secondary | ICD-10-CM | POA: Diagnosis not present

## 2015-12-25 DIAGNOSIS — Z1389 Encounter for screening for other disorder: Secondary | ICD-10-CM | POA: Diagnosis not present

## 2015-12-25 DIAGNOSIS — Z0000 Encounter for general adult medical examination without abnormal findings: Secondary | ICD-10-CM | POA: Diagnosis not present

## 2015-12-25 DIAGNOSIS — E1121 Type 2 diabetes mellitus with diabetic nephropathy: Secondary | ICD-10-CM | POA: Diagnosis not present

## 2016-01-29 DIAGNOSIS — F315 Bipolar disorder, current episode depressed, severe, with psychotic features: Secondary | ICD-10-CM | POA: Diagnosis not present

## 2016-03-25 DIAGNOSIS — M542 Cervicalgia: Secondary | ICD-10-CM | POA: Diagnosis not present

## 2016-03-25 DIAGNOSIS — S161XXA Strain of muscle, fascia and tendon at neck level, initial encounter: Secondary | ICD-10-CM | POA: Diagnosis not present

## 2016-04-04 DIAGNOSIS — L03115 Cellulitis of right lower limb: Secondary | ICD-10-CM | POA: Diagnosis not present

## 2016-04-29 DIAGNOSIS — F315 Bipolar disorder, current episode depressed, severe, with psychotic features: Secondary | ICD-10-CM | POA: Diagnosis not present

## 2016-05-20 DIAGNOSIS — F315 Bipolar disorder, current episode depressed, severe, with psychotic features: Secondary | ICD-10-CM | POA: Diagnosis not present

## 2016-06-08 DIAGNOSIS — F315 Bipolar disorder, current episode depressed, severe, with psychotic features: Secondary | ICD-10-CM | POA: Diagnosis not present

## 2016-07-20 DIAGNOSIS — F315 Bipolar disorder, current episode depressed, severe, with psychotic features: Secondary | ICD-10-CM | POA: Diagnosis not present

## 2016-09-01 DIAGNOSIS — F315 Bipolar disorder, current episode depressed, severe, with psychotic features: Secondary | ICD-10-CM | POA: Diagnosis not present

## 2016-09-16 DIAGNOSIS — E1165 Type 2 diabetes mellitus with hyperglycemia: Secondary | ICD-10-CM | POA: Diagnosis not present

## 2016-09-16 DIAGNOSIS — E785 Hyperlipidemia, unspecified: Secondary | ICD-10-CM | POA: Diagnosis not present

## 2016-09-16 DIAGNOSIS — E1121 Type 2 diabetes mellitus with diabetic nephropathy: Secondary | ICD-10-CM | POA: Diagnosis not present

## 2016-09-16 DIAGNOSIS — I10 Essential (primary) hypertension: Secondary | ICD-10-CM | POA: Diagnosis not present

## 2016-09-21 DIAGNOSIS — E1121 Type 2 diabetes mellitus with diabetic nephropathy: Secondary | ICD-10-CM | POA: Diagnosis not present

## 2016-09-21 DIAGNOSIS — E785 Hyperlipidemia, unspecified: Secondary | ICD-10-CM | POA: Diagnosis not present

## 2016-09-21 DIAGNOSIS — E1165 Type 2 diabetes mellitus with hyperglycemia: Secondary | ICD-10-CM | POA: Diagnosis not present

## 2016-09-21 DIAGNOSIS — I10 Essential (primary) hypertension: Secondary | ICD-10-CM | POA: Diagnosis not present

## 2016-10-19 DIAGNOSIS — R740 Nonspecific elevation of levels of transaminase and lactic acid dehydrogenase [LDH]: Secondary | ICD-10-CM | POA: Diagnosis not present

## 2016-11-09 DIAGNOSIS — F312 Bipolar disorder, current episode manic severe with psychotic features: Secondary | ICD-10-CM | POA: Diagnosis not present

## 2017-01-07 DIAGNOSIS — Z7984 Long term (current) use of oral hypoglycemic drugs: Secondary | ICD-10-CM | POA: Diagnosis not present

## 2017-01-07 DIAGNOSIS — M19172 Post-traumatic osteoarthritis, left ankle and foot: Secondary | ICD-10-CM | POA: Diagnosis not present

## 2017-01-07 DIAGNOSIS — I10 Essential (primary) hypertension: Secondary | ICD-10-CM | POA: Diagnosis not present

## 2017-01-07 DIAGNOSIS — M25572 Pain in left ankle and joints of left foot: Secondary | ICD-10-CM | POA: Diagnosis not present

## 2017-01-07 DIAGNOSIS — E119 Type 2 diabetes mellitus without complications: Secondary | ICD-10-CM | POA: Diagnosis not present

## 2017-01-07 DIAGNOSIS — Z79899 Other long term (current) drug therapy: Secondary | ICD-10-CM | POA: Diagnosis not present

## 2017-01-07 DIAGNOSIS — F17210 Nicotine dependence, cigarettes, uncomplicated: Secondary | ICD-10-CM | POA: Diagnosis not present

## 2017-01-08 DIAGNOSIS — M25572 Pain in left ankle and joints of left foot: Secondary | ICD-10-CM | POA: Diagnosis not present

## 2017-01-18 DIAGNOSIS — I10 Essential (primary) hypertension: Secondary | ICD-10-CM | POA: Diagnosis not present

## 2017-01-18 DIAGNOSIS — E1159 Type 2 diabetes mellitus with other circulatory complications: Secondary | ICD-10-CM | POA: Diagnosis not present

## 2017-01-18 DIAGNOSIS — Z9189 Other specified personal risk factors, not elsewhere classified: Secondary | ICD-10-CM | POA: Diagnosis not present

## 2017-01-18 DIAGNOSIS — E1169 Type 2 diabetes mellitus with other specified complication: Secondary | ICD-10-CM | POA: Diagnosis not present

## 2017-01-18 DIAGNOSIS — F319 Bipolar disorder, unspecified: Secondary | ICD-10-CM | POA: Diagnosis not present

## 2017-01-18 DIAGNOSIS — E1121 Type 2 diabetes mellitus with diabetic nephropathy: Secondary | ICD-10-CM | POA: Diagnosis not present

## 2017-02-14 DIAGNOSIS — L03115 Cellulitis of right lower limb: Secondary | ICD-10-CM | POA: Diagnosis not present

## 2017-02-16 DIAGNOSIS — F312 Bipolar disorder, current episode manic severe with psychotic features: Secondary | ICD-10-CM | POA: Diagnosis not present

## 2017-02-19 DIAGNOSIS — L03115 Cellulitis of right lower limb: Secondary | ICD-10-CM | POA: Diagnosis not present

## 2017-04-21 DIAGNOSIS — F315 Bipolar disorder, current episode depressed, severe, with psychotic features: Secondary | ICD-10-CM | POA: Diagnosis not present

## 2017-04-28 DIAGNOSIS — F315 Bipolar disorder, current episode depressed, severe, with psychotic features: Secondary | ICD-10-CM | POA: Diagnosis not present

## 2017-05-10 DIAGNOSIS — Z1389 Encounter for screening for other disorder: Secondary | ICD-10-CM | POA: Diagnosis not present

## 2017-05-10 DIAGNOSIS — F315 Bipolar disorder, current episode depressed, severe, with psychotic features: Secondary | ICD-10-CM | POA: Diagnosis not present

## 2017-05-10 DIAGNOSIS — E1121 Type 2 diabetes mellitus with diabetic nephropathy: Secondary | ICD-10-CM | POA: Diagnosis not present

## 2017-05-10 DIAGNOSIS — E1165 Type 2 diabetes mellitus with hyperglycemia: Secondary | ICD-10-CM | POA: Diagnosis not present

## 2017-05-10 DIAGNOSIS — Z720 Tobacco use: Secondary | ICD-10-CM | POA: Diagnosis not present

## 2017-05-10 DIAGNOSIS — Z139 Encounter for screening, unspecified: Secondary | ICD-10-CM | POA: Diagnosis not present

## 2017-05-10 DIAGNOSIS — E663 Overweight: Secondary | ICD-10-CM | POA: Diagnosis not present

## 2017-05-25 DIAGNOSIS — F315 Bipolar disorder, current episode depressed, severe, with psychotic features: Secondary | ICD-10-CM | POA: Diagnosis not present

## 2017-06-10 DIAGNOSIS — E1159 Type 2 diabetes mellitus with other circulatory complications: Secondary | ICD-10-CM | POA: Diagnosis not present

## 2017-06-10 DIAGNOSIS — E663 Overweight: Secondary | ICD-10-CM | POA: Diagnosis not present

## 2017-06-10 DIAGNOSIS — E785 Hyperlipidemia, unspecified: Secondary | ICD-10-CM | POA: Diagnosis not present

## 2017-06-10 DIAGNOSIS — E1169 Type 2 diabetes mellitus with other specified complication: Secondary | ICD-10-CM | POA: Diagnosis not present

## 2017-06-10 DIAGNOSIS — I10 Essential (primary) hypertension: Secondary | ICD-10-CM | POA: Diagnosis not present

## 2017-06-10 DIAGNOSIS — Z0000 Encounter for general adult medical examination without abnormal findings: Secondary | ICD-10-CM | POA: Diagnosis not present

## 2017-06-17 DIAGNOSIS — E785 Hyperlipidemia, unspecified: Secondary | ICD-10-CM | POA: Diagnosis not present

## 2017-06-17 DIAGNOSIS — E1165 Type 2 diabetes mellitus with hyperglycemia: Secondary | ICD-10-CM | POA: Diagnosis not present

## 2017-06-17 DIAGNOSIS — I10 Essential (primary) hypertension: Secondary | ICD-10-CM | POA: Diagnosis not present

## 2017-06-17 DIAGNOSIS — E1159 Type 2 diabetes mellitus with other circulatory complications: Secondary | ICD-10-CM | POA: Diagnosis not present

## 2017-06-17 DIAGNOSIS — E1169 Type 2 diabetes mellitus with other specified complication: Secondary | ICD-10-CM | POA: Diagnosis not present

## 2017-06-17 DIAGNOSIS — E1121 Type 2 diabetes mellitus with diabetic nephropathy: Secondary | ICD-10-CM | POA: Diagnosis not present

## 2017-06-22 DIAGNOSIS — F315 Bipolar disorder, current episode depressed, severe, with psychotic features: Secondary | ICD-10-CM | POA: Diagnosis not present

## 2017-07-27 DIAGNOSIS — F315 Bipolar disorder, current episode depressed, severe, with psychotic features: Secondary | ICD-10-CM | POA: Diagnosis not present

## 2017-08-31 DIAGNOSIS — F315 Bipolar disorder, current episode depressed, severe, with psychotic features: Secondary | ICD-10-CM | POA: Diagnosis not present

## 2017-09-29 DIAGNOSIS — F315 Bipolar disorder, current episode depressed, severe, with psychotic features: Secondary | ICD-10-CM | POA: Diagnosis not present

## 2017-11-23 DIAGNOSIS — F315 Bipolar disorder, current episode depressed, severe, with psychotic features: Secondary | ICD-10-CM | POA: Diagnosis not present

## 2017-12-21 DIAGNOSIS — E1140 Type 2 diabetes mellitus with diabetic neuropathy, unspecified: Secondary | ICD-10-CM | POA: Diagnosis not present

## 2017-12-21 DIAGNOSIS — E1159 Type 2 diabetes mellitus with other circulatory complications: Secondary | ICD-10-CM | POA: Diagnosis not present

## 2017-12-21 DIAGNOSIS — E1169 Type 2 diabetes mellitus with other specified complication: Secondary | ICD-10-CM | POA: Diagnosis not present

## 2017-12-21 DIAGNOSIS — E1121 Type 2 diabetes mellitus with diabetic nephropathy: Secondary | ICD-10-CM | POA: Diagnosis not present

## 2017-12-21 DIAGNOSIS — I10 Essential (primary) hypertension: Secondary | ICD-10-CM | POA: Diagnosis not present

## 2018-01-04 DIAGNOSIS — E1169 Type 2 diabetes mellitus with other specified complication: Secondary | ICD-10-CM | POA: Diagnosis not present

## 2018-01-04 DIAGNOSIS — E1140 Type 2 diabetes mellitus with diabetic neuropathy, unspecified: Secondary | ICD-10-CM | POA: Diagnosis not present

## 2018-01-04 DIAGNOSIS — E1159 Type 2 diabetes mellitus with other circulatory complications: Secondary | ICD-10-CM | POA: Diagnosis not present

## 2018-01-04 DIAGNOSIS — E785 Hyperlipidemia, unspecified: Secondary | ICD-10-CM | POA: Diagnosis not present

## 2018-01-25 DIAGNOSIS — F315 Bipolar disorder, current episode depressed, severe, with psychotic features: Secondary | ICD-10-CM | POA: Diagnosis not present

## 2018-02-22 DIAGNOSIS — F315 Bipolar disorder, current episode depressed, severe, with psychotic features: Secondary | ICD-10-CM | POA: Diagnosis not present

## 2018-02-24 DIAGNOSIS — E669 Obesity, unspecified: Secondary | ICD-10-CM | POA: Diagnosis not present

## 2018-02-24 DIAGNOSIS — M12812 Other specific arthropathies, not elsewhere classified, left shoulder: Secondary | ICD-10-CM | POA: Diagnosis not present

## 2018-02-24 DIAGNOSIS — Z1331 Encounter for screening for depression: Secondary | ICD-10-CM | POA: Diagnosis not present

## 2018-02-24 DIAGNOSIS — Z6830 Body mass index (BMI) 30.0-30.9, adult: Secondary | ICD-10-CM | POA: Diagnosis not present

## 2018-02-24 DIAGNOSIS — Z7189 Other specified counseling: Secondary | ICD-10-CM | POA: Diagnosis not present

## 2018-03-01 DIAGNOSIS — R293 Abnormal posture: Secondary | ICD-10-CM | POA: Diagnosis not present

## 2018-03-01 DIAGNOSIS — M6281 Muscle weakness (generalized): Secondary | ICD-10-CM | POA: Diagnosis not present

## 2018-03-01 DIAGNOSIS — M25612 Stiffness of left shoulder, not elsewhere classified: Secondary | ICD-10-CM | POA: Diagnosis not present

## 2018-03-01 DIAGNOSIS — M25512 Pain in left shoulder: Secondary | ICD-10-CM | POA: Diagnosis not present

## 2018-03-10 DIAGNOSIS — M6281 Muscle weakness (generalized): Secondary | ICD-10-CM | POA: Diagnosis not present

## 2018-03-10 DIAGNOSIS — M25612 Stiffness of left shoulder, not elsewhere classified: Secondary | ICD-10-CM | POA: Diagnosis not present

## 2018-03-10 DIAGNOSIS — M25512 Pain in left shoulder: Secondary | ICD-10-CM | POA: Diagnosis not present

## 2018-03-10 DIAGNOSIS — R293 Abnormal posture: Secondary | ICD-10-CM | POA: Diagnosis not present

## 2018-03-15 DIAGNOSIS — M25512 Pain in left shoulder: Secondary | ICD-10-CM | POA: Diagnosis not present

## 2018-03-15 DIAGNOSIS — R293 Abnormal posture: Secondary | ICD-10-CM | POA: Diagnosis not present

## 2018-03-15 DIAGNOSIS — M6281 Muscle weakness (generalized): Secondary | ICD-10-CM | POA: Diagnosis not present

## 2018-03-15 DIAGNOSIS — M25612 Stiffness of left shoulder, not elsewhere classified: Secondary | ICD-10-CM | POA: Diagnosis not present

## 2018-03-31 DIAGNOSIS — F315 Bipolar disorder, current episode depressed, severe, with psychotic features: Secondary | ICD-10-CM | POA: Diagnosis not present

## 2018-06-01 DIAGNOSIS — F315 Bipolar disorder, current episode depressed, severe, with psychotic features: Secondary | ICD-10-CM | POA: Diagnosis not present

## 2018-07-06 DIAGNOSIS — Z79899 Other long term (current) drug therapy: Secondary | ICD-10-CM | POA: Diagnosis not present

## 2018-07-06 DIAGNOSIS — E1169 Type 2 diabetes mellitus with other specified complication: Secondary | ICD-10-CM | POA: Diagnosis not present

## 2018-07-06 DIAGNOSIS — E1140 Type 2 diabetes mellitus with diabetic neuropathy, unspecified: Secondary | ICD-10-CM | POA: Diagnosis not present

## 2018-07-06 DIAGNOSIS — E1159 Type 2 diabetes mellitus with other circulatory complications: Secondary | ICD-10-CM | POA: Diagnosis not present

## 2018-07-13 DIAGNOSIS — Z0000 Encounter for general adult medical examination without abnormal findings: Secondary | ICD-10-CM | POA: Diagnosis not present

## 2018-07-13 DIAGNOSIS — Z23 Encounter for immunization: Secondary | ICD-10-CM | POA: Diagnosis not present

## 2018-07-13 DIAGNOSIS — E785 Hyperlipidemia, unspecified: Secondary | ICD-10-CM | POA: Diagnosis not present

## 2018-07-13 DIAGNOSIS — Z139 Encounter for screening, unspecified: Secondary | ICD-10-CM | POA: Diagnosis not present

## 2018-07-13 DIAGNOSIS — E1169 Type 2 diabetes mellitus with other specified complication: Secondary | ICD-10-CM | POA: Diagnosis not present

## 2018-07-13 DIAGNOSIS — F319 Bipolar disorder, unspecified: Secondary | ICD-10-CM | POA: Diagnosis not present

## 2018-07-13 DIAGNOSIS — E1159 Type 2 diabetes mellitus with other circulatory complications: Secondary | ICD-10-CM | POA: Diagnosis not present

## 2018-07-13 DIAGNOSIS — E1140 Type 2 diabetes mellitus with diabetic neuropathy, unspecified: Secondary | ICD-10-CM | POA: Diagnosis not present

## 2018-07-20 DIAGNOSIS — F315 Bipolar disorder, current episode depressed, severe, with psychotic features: Secondary | ICD-10-CM | POA: Diagnosis not present

## 2018-08-31 DIAGNOSIS — F315 Bipolar disorder, current episode depressed, severe, with psychotic features: Secondary | ICD-10-CM | POA: Diagnosis not present

## 2018-11-03 DIAGNOSIS — E1159 Type 2 diabetes mellitus with other circulatory complications: Secondary | ICD-10-CM | POA: Diagnosis not present

## 2018-11-03 DIAGNOSIS — E1140 Type 2 diabetes mellitus with diabetic neuropathy, unspecified: Secondary | ICD-10-CM | POA: Diagnosis not present

## 2018-11-03 DIAGNOSIS — E1169 Type 2 diabetes mellitus with other specified complication: Secondary | ICD-10-CM | POA: Diagnosis not present

## 2018-11-10 DIAGNOSIS — E1140 Type 2 diabetes mellitus with diabetic neuropathy, unspecified: Secondary | ICD-10-CM | POA: Diagnosis not present

## 2018-11-10 DIAGNOSIS — I10 Essential (primary) hypertension: Secondary | ICD-10-CM | POA: Diagnosis not present

## 2018-11-10 DIAGNOSIS — E1169 Type 2 diabetes mellitus with other specified complication: Secondary | ICD-10-CM | POA: Diagnosis not present

## 2018-11-10 DIAGNOSIS — Z789 Other specified health status: Secondary | ICD-10-CM | POA: Diagnosis not present

## 2018-11-10 DIAGNOSIS — E1159 Type 2 diabetes mellitus with other circulatory complications: Secondary | ICD-10-CM | POA: Diagnosis not present

## 2018-11-10 DIAGNOSIS — E785 Hyperlipidemia, unspecified: Secondary | ICD-10-CM | POA: Diagnosis not present

## 2018-11-11 DIAGNOSIS — E785 Hyperlipidemia, unspecified: Secondary | ICD-10-CM | POA: Diagnosis not present

## 2018-11-11 DIAGNOSIS — E1140 Type 2 diabetes mellitus with diabetic neuropathy, unspecified: Secondary | ICD-10-CM | POA: Diagnosis not present

## 2018-11-11 DIAGNOSIS — E1169 Type 2 diabetes mellitus with other specified complication: Secondary | ICD-10-CM | POA: Diagnosis not present

## 2018-11-11 DIAGNOSIS — E1159 Type 2 diabetes mellitus with other circulatory complications: Secondary | ICD-10-CM | POA: Diagnosis not present

## 2018-11-23 DIAGNOSIS — F315 Bipolar disorder, current episode depressed, severe, with psychotic features: Secondary | ICD-10-CM | POA: Diagnosis not present

## 2018-12-13 DIAGNOSIS — E785 Hyperlipidemia, unspecified: Secondary | ICD-10-CM | POA: Diagnosis not present

## 2018-12-13 DIAGNOSIS — E1140 Type 2 diabetes mellitus with diabetic neuropathy, unspecified: Secondary | ICD-10-CM | POA: Diagnosis not present

## 2018-12-13 DIAGNOSIS — E1159 Type 2 diabetes mellitus with other circulatory complications: Secondary | ICD-10-CM | POA: Diagnosis not present

## 2018-12-13 DIAGNOSIS — E1169 Type 2 diabetes mellitus with other specified complication: Secondary | ICD-10-CM | POA: Diagnosis not present

## 2018-12-17 DIAGNOSIS — I361 Nonrheumatic tricuspid (valve) insufficiency: Secondary | ICD-10-CM | POA: Diagnosis not present

## 2018-12-17 DIAGNOSIS — T796XXA Traumatic ischemia of muscle, initial encounter: Secondary | ICD-10-CM | POA: Diagnosis not present

## 2018-12-17 DIAGNOSIS — E785 Hyperlipidemia, unspecified: Secondary | ICD-10-CM | POA: Diagnosis not present

## 2018-12-17 DIAGNOSIS — N179 Acute kidney failure, unspecified: Secondary | ICD-10-CM | POA: Diagnosis not present

## 2018-12-17 DIAGNOSIS — S0990XA Unspecified injury of head, initial encounter: Secondary | ICD-10-CM | POA: Diagnosis not present

## 2018-12-17 DIAGNOSIS — I214 Non-ST elevation (NSTEMI) myocardial infarction: Secondary | ICD-10-CM | POA: Diagnosis not present

## 2018-12-17 DIAGNOSIS — R531 Weakness: Secondary | ICD-10-CM | POA: Diagnosis not present

## 2018-12-17 DIAGNOSIS — E871 Hypo-osmolality and hyponatremia: Secondary | ICD-10-CM | POA: Diagnosis not present

## 2018-12-17 DIAGNOSIS — R7989 Other specified abnormal findings of blood chemistry: Secondary | ICD-10-CM | POA: Diagnosis not present

## 2018-12-17 DIAGNOSIS — F1010 Alcohol abuse, uncomplicated: Secondary | ICD-10-CM | POA: Diagnosis not present

## 2018-12-17 DIAGNOSIS — E872 Acidosis: Secondary | ICD-10-CM | POA: Diagnosis not present

## 2018-12-17 DIAGNOSIS — I340 Nonrheumatic mitral (valve) insufficiency: Secondary | ICD-10-CM | POA: Diagnosis not present

## 2018-12-17 DIAGNOSIS — K769 Liver disease, unspecified: Secondary | ICD-10-CM | POA: Diagnosis not present

## 2018-12-17 DIAGNOSIS — E119 Type 2 diabetes mellitus without complications: Secondary | ICD-10-CM | POA: Diagnosis not present

## 2018-12-17 DIAGNOSIS — R740 Nonspecific elevation of levels of transaminase and lactic acid dehydrogenase [LDH]: Secondary | ICD-10-CM | POA: Diagnosis not present

## 2018-12-17 DIAGNOSIS — W19XXXA Unspecified fall, initial encounter: Secondary | ICD-10-CM | POA: Diagnosis not present

## 2018-12-17 DIAGNOSIS — I5042 Chronic combined systolic (congestive) and diastolic (congestive) heart failure: Secondary | ICD-10-CM | POA: Diagnosis not present

## 2018-12-17 DIAGNOSIS — I272 Pulmonary hypertension, unspecified: Secondary | ICD-10-CM | POA: Diagnosis not present

## 2018-12-17 DIAGNOSIS — I429 Cardiomyopathy, unspecified: Secondary | ICD-10-CM | POA: Diagnosis not present

## 2018-12-18 DIAGNOSIS — F1010 Alcohol abuse, uncomplicated: Secondary | ICD-10-CM

## 2018-12-18 DIAGNOSIS — R7989 Other specified abnormal findings of blood chemistry: Secondary | ICD-10-CM

## 2018-12-18 DIAGNOSIS — I361 Nonrheumatic tricuspid (valve) insufficiency: Secondary | ICD-10-CM

## 2018-12-18 DIAGNOSIS — I272 Pulmonary hypertension, unspecified: Secondary | ICD-10-CM

## 2018-12-18 DIAGNOSIS — I340 Nonrheumatic mitral (valve) insufficiency: Secondary | ICD-10-CM

## 2018-12-18 DIAGNOSIS — I429 Cardiomyopathy, unspecified: Secondary | ICD-10-CM

## 2018-12-19 ENCOUNTER — Inpatient Hospital Stay (HOSPITAL_COMMUNITY)
Admission: AD | Admit: 2018-12-19 | Discharge: 2019-01-15 | DRG: 673 | Disposition: A | Discharge status: Home / Self Care | Payer: Medicare HMO | Source: Other Acute Inpatient Hospital | Attending: Student | Admitting: Student

## 2018-12-19 ENCOUNTER — Other Ambulatory Visit: Payer: Self-pay

## 2018-12-19 ENCOUNTER — Encounter (HOSPITAL_COMMUNITY): Payer: Self-pay

## 2018-12-19 DIAGNOSIS — Z79891 Long term (current) use of opiate analgesic: Secondary | ICD-10-CM

## 2018-12-19 DIAGNOSIS — E1169 Type 2 diabetes mellitus with other specified complication: Secondary | ICD-10-CM | POA: Diagnosis not present

## 2018-12-19 DIAGNOSIS — N5089 Other specified disorders of the male genital organs: Secondary | ICD-10-CM | POA: Diagnosis not present

## 2018-12-19 DIAGNOSIS — E119 Type 2 diabetes mellitus without complications: Secondary | ICD-10-CM | POA: Diagnosis present

## 2018-12-19 DIAGNOSIS — Z992 Dependence on renal dialysis: Secondary | ICD-10-CM | POA: Diagnosis not present

## 2018-12-19 DIAGNOSIS — K760 Fatty (change of) liver, not elsewhere classified: Secondary | ICD-10-CM | POA: Diagnosis present

## 2018-12-19 DIAGNOSIS — N19 Unspecified kidney failure: Secondary | ICD-10-CM

## 2018-12-19 DIAGNOSIS — E876 Hypokalemia: Secondary | ICD-10-CM | POA: Diagnosis present

## 2018-12-19 DIAGNOSIS — R531 Weakness: Secondary | ICD-10-CM | POA: Diagnosis present

## 2018-12-19 DIAGNOSIS — I110 Hypertensive heart disease with heart failure: Secondary | ICD-10-CM | POA: Diagnosis present

## 2018-12-19 DIAGNOSIS — I214 Non-ST elevation (NSTEMI) myocardial infarction: Secondary | ICD-10-CM | POA: Diagnosis not present

## 2018-12-19 DIAGNOSIS — Z6835 Body mass index (BMI) 35.0-35.9, adult: Secondary | ICD-10-CM | POA: Diagnosis not present

## 2018-12-19 DIAGNOSIS — R21 Rash and other nonspecific skin eruption: Secondary | ICD-10-CM | POA: Diagnosis present

## 2018-12-19 DIAGNOSIS — K0889 Other specified disorders of teeth and supporting structures: Secondary | ICD-10-CM | POA: Diagnosis not present

## 2018-12-19 DIAGNOSIS — R7881 Bacteremia: Secondary | ICD-10-CM | POA: Diagnosis not present

## 2018-12-19 DIAGNOSIS — I132 Hypertensive heart and chronic kidney disease with heart failure and with stage 5 chronic kidney disease, or end stage renal disease: Secondary | ICD-10-CM

## 2018-12-19 DIAGNOSIS — F419 Anxiety disorder, unspecified: Secondary | ICD-10-CM | POA: Diagnosis present

## 2018-12-19 DIAGNOSIS — R0600 Dyspnea, unspecified: Secondary | ICD-10-CM

## 2018-12-19 DIAGNOSIS — E872 Acidosis: Secondary | ICD-10-CM | POA: Diagnosis present

## 2018-12-19 DIAGNOSIS — F312 Bipolar disorder, current episode manic severe with psychotic features: Secondary | ICD-10-CM | POA: Diagnosis not present

## 2018-12-19 DIAGNOSIS — I5043 Acute on chronic combined systolic (congestive) and diastolic (congestive) heart failure: Secondary | ICD-10-CM | POA: Diagnosis not present

## 2018-12-19 DIAGNOSIS — R011 Cardiac murmur, unspecified: Secondary | ICD-10-CM | POA: Diagnosis not present

## 2018-12-19 DIAGNOSIS — Z87448 Personal history of other diseases of urinary system: Secondary | ICD-10-CM | POA: Diagnosis not present

## 2018-12-19 DIAGNOSIS — I82C11 Acute embolism and thrombosis of right internal jugular vein: Secondary | ICD-10-CM | POA: Diagnosis not present

## 2018-12-19 DIAGNOSIS — Z87891 Personal history of nicotine dependence: Secondary | ICD-10-CM

## 2018-12-19 DIAGNOSIS — F3181 Bipolar II disorder: Secondary | ICD-10-CM | POA: Diagnosis present

## 2018-12-19 DIAGNOSIS — Z95828 Presence of other vascular implants and grafts: Secondary | ICD-10-CM | POA: Diagnosis not present

## 2018-12-19 DIAGNOSIS — K7200 Acute and subacute hepatic failure without coma: Secondary | ICD-10-CM | POA: Diagnosis not present

## 2018-12-19 DIAGNOSIS — E1122 Type 2 diabetes mellitus with diabetic chronic kidney disease: Secondary | ICD-10-CM

## 2018-12-19 DIAGNOSIS — R001 Bradycardia, unspecified: Secondary | ICD-10-CM | POA: Diagnosis not present

## 2018-12-19 DIAGNOSIS — F1010 Alcohol abuse, uncomplicated: Secondary | ICD-10-CM | POA: Diagnosis not present

## 2018-12-19 DIAGNOSIS — T827XXD Infection and inflammatory reaction due to other cardiac and vascular devices, implants and grafts, subsequent encounter: Secondary | ICD-10-CM | POA: Diagnosis not present

## 2018-12-19 DIAGNOSIS — R34 Anuria and oliguria: Secondary | ICD-10-CM | POA: Diagnosis not present

## 2018-12-19 DIAGNOSIS — E875 Hyperkalemia: Secondary | ICD-10-CM | POA: Diagnosis not present

## 2018-12-19 DIAGNOSIS — Z794 Long term (current) use of insulin: Secondary | ICD-10-CM

## 2018-12-19 DIAGNOSIS — B9561 Methicillin susceptible Staphylococcus aureus infection as the cause of diseases classified elsewhere: Secondary | ICD-10-CM | POA: Diagnosis not present

## 2018-12-19 DIAGNOSIS — Z79899 Other long term (current) drug therapy: Secondary | ICD-10-CM

## 2018-12-19 DIAGNOSIS — F319 Bipolar disorder, unspecified: Secondary | ICD-10-CM | POA: Diagnosis not present

## 2018-12-19 DIAGNOSIS — F1020 Alcohol dependence, uncomplicated: Secondary | ICD-10-CM | POA: Diagnosis present

## 2018-12-19 DIAGNOSIS — Z841 Family history of disorders of kidney and ureter: Secondary | ICD-10-CM

## 2018-12-19 DIAGNOSIS — T796XXA Traumatic ischemia of muscle, initial encounter: Secondary | ICD-10-CM | POA: Diagnosis present

## 2018-12-19 DIAGNOSIS — W19XXXA Unspecified fall, initial encounter: Secondary | ICD-10-CM | POA: Diagnosis not present

## 2018-12-19 DIAGNOSIS — N5082 Scrotal pain: Secondary | ICD-10-CM | POA: Diagnosis not present

## 2018-12-19 DIAGNOSIS — I82409 Acute embolism and thrombosis of unspecified deep veins of unspecified lower extremity: Secondary | ICD-10-CM | POA: Diagnosis not present

## 2018-12-19 DIAGNOSIS — Z4901 Encounter for fitting and adjustment of extracorporeal dialysis catheter: Secondary | ICD-10-CM | POA: Diagnosis not present

## 2018-12-19 DIAGNOSIS — R509 Fever, unspecified: Secondary | ICD-10-CM | POA: Diagnosis not present

## 2018-12-19 DIAGNOSIS — Y838 Other surgical procedures as the cause of abnormal reaction of the patient, or of later complication, without mention of misadventure at the time of the procedure: Secondary | ICD-10-CM | POA: Diagnosis not present

## 2018-12-19 DIAGNOSIS — T827XXA Infection and inflammatory reaction due to other cardiac and vascular devices, implants and grafts, initial encounter: Secondary | ICD-10-CM | POA: Diagnosis not present

## 2018-12-19 DIAGNOSIS — E669 Obesity, unspecified: Secondary | ICD-10-CM | POA: Diagnosis present

## 2018-12-19 DIAGNOSIS — N170 Acute kidney failure with tubular necrosis: Secondary | ICD-10-CM | POA: Diagnosis not present

## 2018-12-19 DIAGNOSIS — T796XXD Traumatic ischemia of muscle, subsequent encounter: Secondary | ICD-10-CM | POA: Diagnosis not present

## 2018-12-19 DIAGNOSIS — D72825 Bandemia: Secondary | ICD-10-CM

## 2018-12-19 DIAGNOSIS — N186 End stage renal disease: Secondary | ICD-10-CM

## 2018-12-19 DIAGNOSIS — N179 Acute kidney failure, unspecified: Secondary | ICD-10-CM | POA: Diagnosis not present

## 2018-12-19 DIAGNOSIS — I340 Nonrheumatic mitral (valve) insufficiency: Secondary | ICD-10-CM | POA: Diagnosis not present

## 2018-12-19 DIAGNOSIS — R740 Nonspecific elevation of levels of transaminase and lactic acid dehydrogenase [LDH]: Secondary | ICD-10-CM | POA: Diagnosis not present

## 2018-12-19 DIAGNOSIS — E871 Hypo-osmolality and hyponatremia: Secondary | ICD-10-CM | POA: Diagnosis not present

## 2018-12-19 DIAGNOSIS — E1129 Type 2 diabetes mellitus with other diabetic kidney complication: Secondary | ICD-10-CM | POA: Diagnosis not present

## 2018-12-19 DIAGNOSIS — D638 Anemia in other chronic diseases classified elsewhere: Secondary | ICD-10-CM | POA: Diagnosis not present

## 2018-12-19 DIAGNOSIS — A419 Sepsis, unspecified organism: Secondary | ICD-10-CM | POA: Diagnosis not present

## 2018-12-19 DIAGNOSIS — M7989 Other specified soft tissue disorders: Secondary | ICD-10-CM | POA: Diagnosis not present

## 2018-12-19 DIAGNOSIS — I10 Essential (primary) hypertension: Secondary | ICD-10-CM | POA: Diagnosis not present

## 2018-12-19 DIAGNOSIS — W06XXXA Fall from bed, initial encounter: Secondary | ICD-10-CM | POA: Diagnosis not present

## 2018-12-19 DIAGNOSIS — E785 Hyperlipidemia, unspecified: Secondary | ICD-10-CM | POA: Diagnosis not present

## 2018-12-19 DIAGNOSIS — Z833 Family history of diabetes mellitus: Secondary | ICD-10-CM

## 2018-12-19 DIAGNOSIS — I081 Rheumatic disorders of both mitral and tricuspid valves: Secondary | ICD-10-CM | POA: Diagnosis not present

## 2018-12-19 DIAGNOSIS — A4101 Sepsis due to Methicillin susceptible Staphylococcus aureus: Secondary | ICD-10-CM | POA: Diagnosis not present

## 2018-12-19 LAB — COMPREHENSIVE METABOLIC PANEL
ALT: 213 U/L — ABNORMAL HIGH (ref 0–44)
AST: 1035 U/L — ABNORMAL HIGH (ref 15–41)
Albumin: 2.7 g/dL — ABNORMAL LOW (ref 3.5–5.0)
Alkaline Phosphatase: 41 U/L (ref 38–126)
Anion gap: 11 (ref 5–15)
BUN: 43 mg/dL — ABNORMAL HIGH (ref 6–20)
CO2: 15 mmol/L — ABNORMAL LOW (ref 22–32)
Calcium: 7.7 mg/dL — ABNORMAL LOW (ref 8.9–10.3)
Chloride: 108 mmol/L (ref 98–111)
Creatinine, Ser: 4.45 mg/dL — ABNORMAL HIGH (ref 0.61–1.24)
GFR calc Af Amer: 17 mL/min — ABNORMAL LOW (ref >60)
GFR calc non Af Amer: 15 mL/min — ABNORMAL LOW (ref >60)
Glucose, Bld: 106 mg/dL — ABNORMAL HIGH (ref 70–99)
Potassium: 4.1 mmol/L (ref 3.5–5.1)
Sodium: 134 mmol/L — ABNORMAL LOW (ref 135–145)
Total Bilirubin: 0.6 mg/dL (ref 0.3–1.2)
Total Protein: 5.2 g/dL — ABNORMAL LOW (ref 6.5–8.1)

## 2018-12-19 LAB — PHOSPHORUS: Phosphorus: 4.6 mg/dL (ref 2.5–4.6)

## 2018-12-19 LAB — CBC WITH DIFFERENTIAL/PLATELET
Abs Immature Granulocytes: 0.05 10*3/uL (ref 0.00–0.07)
Basophils Absolute: 0 10*3/uL (ref 0.0–0.1)
Basophils Relative: 0 %
Eosinophils Absolute: 0 10*3/uL (ref 0.0–0.5)
Eosinophils Relative: 0 %
HCT: 35.7 % — ABNORMAL LOW (ref 39.0–52.0)
Hemoglobin: 12.7 g/dL — ABNORMAL LOW (ref 13.0–17.0)
Immature Granulocytes: 0 %
Lymphocytes Relative: 8 %
Lymphs Abs: 0.9 10*3/uL (ref 0.7–4.0)
MCH: 31.7 pg (ref 26.0–34.0)
MCHC: 35.6 g/dL (ref 30.0–36.0)
MCV: 89 fL (ref 80.0–100.0)
Monocytes Absolute: 0.6 10*3/uL (ref 0.1–1.0)
Monocytes Relative: 5 %
Neutro Abs: 10 10*3/uL — ABNORMAL HIGH (ref 1.7–7.7)
Neutrophils Relative %: 87 %
Platelets: 183 10*3/uL (ref 150–400)
RBC: 4.01 MIL/uL — ABNORMAL LOW (ref 4.22–5.81)
RDW: 12.3 % (ref 11.5–15.5)
WBC: 11.6 10*3/uL — ABNORMAL HIGH (ref 4.0–10.5)
nRBC: 0 % (ref 0.0–0.2)

## 2018-12-19 LAB — MAGNESIUM: Magnesium: 1.7 mg/dL (ref 1.7–2.4)

## 2018-12-19 LAB — GLUCOSE, CAPILLARY
Glucose-Capillary: 93 mg/dL (ref 70–99)
Glucose-Capillary: 107 mg/dL — ABNORMAL HIGH (ref 70–99)

## 2018-12-19 LAB — CK: Total CK: >50000 U/L — ABNORMAL HIGH (ref 49–397)

## 2018-12-19 LAB — TSH: TSH: 2.361 u[IU]/mL (ref 0.350–4.500)

## 2018-12-19 MED ORDER — RISPERIDONE 3 MG PO TABS
9.0000 mg | ORAL_TABLET | Freq: Every day | ORAL | Status: DC
  Administered 2018-12-19 – 2019-01-14 (×27): 9 mg via ORAL
  Filled 2018-12-19 (×28): qty 3

## 2018-12-19 MED ORDER — SODIUM CHLORIDE 0.45 % IV SOLN
INTRAVENOUS | Status: DC
  Filled 2018-12-19 (×3): qty 1000

## 2018-12-19 MED ORDER — FOLIC ACID 1 MG PO TABS
1.0000 mg | ORAL_TABLET | Freq: Every day | ORAL | Status: DC
  Administered 2018-12-19 – 2019-01-15 (×26): 1 mg via ORAL
  Filled 2018-12-19 (×26): qty 1

## 2018-12-19 MED ORDER — VITAMIN B-1 100 MG PO TABS
100.0000 mg | ORAL_TABLET | Freq: Every day | ORAL | Status: DC
  Administered 2018-12-19 – 2019-01-15 (×26): 100 mg via ORAL
  Filled 2018-12-19 (×27): qty 1

## 2018-12-19 MED ORDER — INSULIN ASPART 100 UNIT/ML ~~LOC~~ SOLN: 0.0000 [IU] | Freq: Every day | SUBCUTANEOUS | Status: DC

## 2018-12-19 MED ORDER — STERILE WATER FOR INJECTION IV SOLN
INTRAVENOUS | Status: DC
  Administered 2018-12-19 – 2018-12-20 (×2): via INTRAVENOUS
  Filled 2018-12-19 (×6): qty 850

## 2018-12-19 MED ORDER — HEPARIN SODIUM (PORCINE) 5000 UNIT/ML IJ SOLN
5000.0000 [IU] | Freq: Three times a day (TID) | INTRAMUSCULAR | Status: DC
  Administered 2018-12-19 – 2019-01-01 (×37): 5000 [IU] via SUBCUTANEOUS
  Filled 2018-12-19 (×38): qty 1

## 2018-12-19 MED ORDER — ONDANSETRON HCL 4 MG/2ML IJ SOLN
4.0000 mg | Freq: Four times a day (QID) | INTRAMUSCULAR | Status: DC | PRN
  Administered 2018-12-19: 18:00:00 4 mg via INTRAVENOUS
  Filled 2018-12-19 (×2): qty 2

## 2018-12-19 MED ORDER — SODIUM CHLORIDE 0.9 % IV SOLN: INTRAVENOUS | Status: DC

## 2018-12-19 MED ORDER — OXYCODONE HCL 5 MG PO TABS
5.0000 mg | ORAL_TABLET | ORAL | Status: DC | PRN
  Administered 2018-12-19 – 2019-01-10 (×57): 5 mg via ORAL
  Filled 2018-12-19 (×58): qty 1

## 2018-12-19 MED ORDER — TOPIRAMATE 25 MG PO TABS
250.0000 mg | ORAL_TABLET | Freq: Every day | ORAL | Status: DC
  Administered 2018-12-19 – 2019-01-14 (×27): 250 mg via ORAL
  Filled 2018-12-19 (×27): qty 2

## 2018-12-19 MED ORDER — FLUOXETINE HCL 20 MG PO CAPS
40.0000 mg | ORAL_CAPSULE | Freq: Every day | ORAL | Status: DC
  Administered 2018-12-20 – 2019-01-15 (×26): 40 mg via ORAL
  Filled 2018-12-19 (×26): qty 2

## 2018-12-19 MED ORDER — LOXAPINE SUCCINATE 25 MG PO CAPS
50.0000 mg | ORAL_CAPSULE | Freq: Every day | ORAL | Status: DC
  Administered 2018-12-19 – 2019-01-14 (×27): 50 mg via ORAL
  Filled 2018-12-19 (×3): qty 2
  Filled 2018-12-19: qty 10
  Filled 2018-12-19 (×9): qty 2
  Filled 2018-12-19 (×2): qty 10
  Filled 2018-12-19 (×5): qty 2
  Filled 2018-12-19: qty 10
  Filled 2018-12-19 (×7): qty 2
  Filled 2018-12-19: qty 10
  Filled 2018-12-19 (×4): qty 2

## 2018-12-19 MED ORDER — ACETAMINOPHEN 650 MG RE SUPP: 650.0000 mg | Freq: Four times a day (QID) | RECTAL | Status: DC | PRN

## 2018-12-19 MED ORDER — CLONAZEPAM 1 MG PO TABS
1.0000 mg | ORAL_TABLET | Freq: Two times a day (BID) | ORAL | Status: DC
  Administered 2018-12-19: 1 mg via ORAL
  Filled 2018-12-19: qty 1

## 2018-12-19 MED ORDER — INSULIN ASPART 100 UNIT/ML ~~LOC~~ SOLN
0.0000 [IU] | Freq: Three times a day (TID) | SUBCUTANEOUS | Status: DC
  Administered 2018-12-25: 1 [IU] via SUBCUTANEOUS

## 2018-12-19 MED ORDER — ACETAMINOPHEN 325 MG PO TABS: 650.0000 mg | ORAL_TABLET | Freq: Four times a day (QID) | ORAL | Status: DC | PRN

## 2018-12-19 MED ORDER — TRAZODONE HCL 50 MG PO TABS
300.0000 mg | ORAL_TABLET | Freq: Every day | ORAL | Status: DC
  Administered 2018-12-19 – 2019-01-14 (×27): 300 mg via ORAL
  Filled 2018-12-19 (×27): qty 6

## 2018-12-19 MED ORDER — ONDANSETRON HCL 4 MG PO TABS: 4.0000 mg | ORAL_TABLET | Freq: Four times a day (QID) | ORAL | Status: DC | PRN

## 2018-12-19 MED ORDER — LOXAPINE SUCCINATE 25 MG PO CAPS
25.0000 mg | ORAL_CAPSULE | Freq: Every day | ORAL | Status: DC
  Administered 2018-12-20 – 2019-01-15 (×25): 25 mg via ORAL
  Filled 2018-12-19 (×23): qty 5
  Filled 2018-12-19: qty 1
  Filled 2018-12-19 (×5): qty 5

## 2018-12-19 MED ORDER — LORAZEPAM 2 MG/ML IJ SOLN: 0.5000 mg | INTRAMUSCULAR | Status: DC | PRN

## 2018-12-19 NOTE — H&P (Signed)
History and Physical    James [name] XTK:[id number] DOB: [birth year] DOA: 12/19/2018  PCP: [place], Betty G, MD  Patient coming from: [facility]. From Home   I have personally briefly reviewed patient's old medical records available.   Chief Complaint: [name] and could not get up. Whole body ache.   HPI: James [name] is a 43 y.o. male with medical history 43 year old male with history of hypertension, well controlled NIDDM-2 and bipolar disorder presenting to emergency department via EMS after found down on floor for 2 days. Patient reported drinking liquor on Friday and fell of the bed and could not get up for two days until his mother found him down on the floor. Reported binge drinking alcohol intermittently. Denies prior history of withdrawal, seizure or hospitalization for alcohol.  Denies history of heart disease, lung disease, kidney disease or stroke.  In ED, vital signs within normal range. CBC remarkable for leukocytosis to 17 with bandemia.  CMP: Bicarb 13, anion gap 12, glucose 138, AST 712, ALT 82, creatinine 2.9 (baseline 0.7), BUN 37. ABG 7.27/27/83/12.4 suggestive for metabolic acidosis. Lactic acid 4.2. CK elevated to 58,000. CK-MB 67.8 but CK-MB index of 0.1. Myoglobin greater than 80,000. BNP 2400. Lipase within normal range. Troponin elevated to 2.62 and trended down to 1.48. UDS negative. Tylenol and alcohol level within normal range. Urinalysis with 3+ protein and 3+ RBC. EKG without acute ischemic finding. CXR, CT head and cervical spine without acute finding. RUQ ultrasound concerning for early cirrhosis. Cardiology consulted by admitting provider.  Patient was admitted on IV fluid, sodium bicarb and CIWA protocol. CK 131000-110000 Myoglobulin 26,000 Urine out put 200 ml in last 24 hours. Lasix 120 mg given earlier today. Has 100 ml urine since today morning .   Reason for transfer: Patient continues to be anuric and elevated creatinine today from  2.9-3.6. Transferred to [facility] for higher level of care and nephrology management.   Review of Systems: As per HPI otherwise 10 point review of systems negative.  Currently complains of whole body ache.  He also has some rashes on the anterior chest wall not on the other body area. He had normal bowel movement and had 2 bowel movement yesterday. Denies any nausea vomiting.  He is eating regular diet.   Past Medical History:  Diagnosis Date  . Bipolar 1 disorder ([place])   . Cellulitis 08/24/2012   "RLE and spot on my right forearm" (08/24/2012)  . Type II diabetes mellitus ([place]) ~ 2009   "take Metformin" (08/24/2012)    Past Surgical History:  Procedure Laterality Date  . EYE SURGERY    . FOOT FRACTURE SURGERY     "left; scaffold broke" 08/24/2012)  . OPEN ANTERIOR SHOULDER RECONSTRUCTION  ~ 2003   "right; kept dislocating; cut it open; sewed muscle back together; moved cartilage around and stapled that" (08/24/2012)     reports that he has quit smoking. He smoked 0.00 packs per day for 25.00 years. He has never used smokeless tobacco. He reports current alcohol use of about 12.0 standard drinks of alcohol per week. He reports that he does not use drugs.  No Known Allergies  Family History  Problem Relation Age of Onset  . Diabetic kidney disease Maternal Uncle      Prior to Admission medications   Medication Sig Start Date End Date Taking? Authorizing Provider  asenapine (SAPHRIS) 5 MG SUBL Place 10 mg under the tongue 2 (two) times daily.     [provider]  divalproex (DEPAKOTE ER) 500 MG 24 hr tablet Take 2,000 mg by mouth at bedtime.    [provider]  FLUoxetine (PROZAC) 20 MG capsule Take 40 mg by mouth daily.    [provider]  HYDROmorphone (DILAUDID) 4 MG tablet Take 1 tablet (4 mg total) by mouth every 4 (four) hours as needed for severe pain. 05/16/14   Theodis Blaze, MD  omeprazole (PRILOSEC OTC) 20 MG tablet Take 20 mg by mouth  daily.    [provider]  ondansetron (ZOFRAN) 4 MG tablet Take 1 tablet (4 mg total) by mouth every 6 (six) hours as needed for nausea. 05/16/14   Theodis Blaze, MD  risperidone (RISPERDAL) 4 MG tablet Take 4 mg by mouth at bedtime.     [provider]    Physical Exam: Vitals:   12/19/18 1554  BP: (!) 160/91  Pulse: 91  Resp: (!) 22  Temp: 97.8 F (36.6 C)  TempSrc: Oral  SpO2: 98%  Weight: 124.6 kg  Height: 6\' 3"  (1.905 m)    Constitutional: NAD, calm, comfortable Vitals:   12/19/18 1554  BP: (!) 160/91  Pulse: 91  Resp: (!) 22  Temp: 97.8 F (36.6 C)  TempSrc: Oral  SpO2: 98%  Weight: 124.6 kg  Height: 6\' 3"  (1.905 m)   Eyes: PERRL, lids and conjunctivae normal ENMT: Mucous membranes are moist. Posterior pharynx clear of any exudate or lesions.Normal dentition.  Neck: normal, supple, no masses, no thyromegaly Respiratory: clear to auscultation bilaterally, no wheezing, no crackles. Normal respiratory effort. No accessory muscle use.  Cardiovascular: Regular rate and rhythm, no murmurs / rubs / gallops. No extremity edema. 2+ pedal pulses. No carotid bruits.  Abdomen: no tenderness, no masses palpated. No hepatosplenomegaly. Bowel sounds positive.  Obese and pendulous.  Nontender. Musculoskeletal: no clubbing / cyanosis. No joint deformity upper and lower extremities. Good ROM, no contractures. Normal muscle tone.  Skin: no rashes, lesions, ulcers. No induration Neurologic: CN 2-12 grossly intact. Sensation intact, DTR normal. Strength 5/5 in all 4.  Psychiatric: Normal judgment and insight. Alert and oriented x 3. Normal mood.  Patient has multiple areas of abrasions. Has macular rashes on the anterior chest wall, probably contact dermatitis.   Labs on Admission: I have personally reviewed following labs and imaging studies  CBC: No results for input(s): WBC, NEUTROABS, HGB, HCT, MCV, PLT in the last 168 hours. Basic Metabolic Panel: No results  for input(s): NA, K, CL, CO2, GLUCOSE, BUN, CREATININE, CALCIUM, MG, PHOS in the last 168 hours. GFR: CrCl cannot be calculated (Patient's most recent lab result is older than the maximum 21 days allowed.). Liver Function Tests: No results for input(s): AST, ALT, ALKPHOS, BILITOT, PROT, ALBUMIN in the last 168 hours. No results for input(s): LIPASE, AMYLASE in the last 168 hours. No results for input(s): AMMONIA in the last 168 hours. Coagulation Profile: No results for input(s): INR, PROTIME in the last 168 hours. Cardiac Enzymes: No results for input(s): CKTOTAL, CKMB, CKMBINDEX, TROPONINI in the last 168 hours. BNP (last 3 results) No results for input(s): PROBNP in the last 8760 hours. HbA1C: No results for input(s): HGBA1C in the last 72 hours. CBG: No results for input(s): GLUCAP in the last 168 hours. Lipid Profile: No results for input(s): CHOL, HDL, LDLCALC, TRIG, CHOLHDL, LDLDIRECT in the last 72 hours. Thyroid Function Tests: No results for input(s): TSH, T4TOTAL, FREET4, T3FREE, THYROIDAB in the last 72 hours. Anemia Panel: No results for input(s):  VITAMINB12, FOLATE, FERRITIN, TIBC, IRON, RETICCTPCT in the last 72 hours. Urine analysis:    Component Value Date/Time   COLORURINE ORANGE (A) 05/11/2014 0921   APPEARANCEUR CLEAR 05/11/2014 0921   LABSPEC 1.037 (H) 05/11/2014 0921   PHURINE 6.5 05/11/2014 0921   GLUCOSEU >1000 (A) 05/11/2014 0921   HGBUR NEGATIVE 05/11/2014 0921   BILIRUBINUR SMALL (A) 05/11/2014 0921   KETONESUR 15 (A) 05/11/2014 0921   PROTEINUR 100 (A) 05/11/2014 0921   UROBILINOGEN 1.0 05/11/2014 0921   NITRITE NEGATIVE 05/11/2014 0921   LEUKOCYTESUR NEGATIVE 05/11/2014 0921    Radiological Exams on Admission: No results found.  EKG: Independently reviewed.  Reviewed from outside hospital.  Normal sinus rhythm.  Assessment/Plan Principal Problem:   Acute renal failure due to traumatic rhabdomyolysis [facility]) Active Problems:   Diabetes  mellitus ([place])   Bipolar affective disorder ([place])   Alcoholism ([place])     1.  Acute renal failure due to traumatic rhabdomyolysis: Anuric renal failure. Continue maintenance isotonic fluid 100 mL/h.  Continue bicarb 125 mL/h. Foley catheter for strict intake and output monitoring. Repeat renal function test and liver function test as well as CK now. His potassium is normal.  Patient is not fluid overloaded.  We will continue to monitor. Renal ultrasound was done and it was normal. Case was discussed with nephrology, they will see patient in consultation and further management as per them. Complains of diffuse body ache, will treat with oxycodone as needed.  2.  Bipolar 2 disorder on multiple medications including risperidone: No evidence of neuroleptic malignant syndrome.  Will resume his home medications including risperidone.  3.  Alcoholism: Patient stated intermittent use of alcohol.  Last drink was Friday.  No evidence of withdrawal.  Will keep on Ativan as needed.  Will keep on multivitamins.  4.  Troponinemia: Suspect type II non-STEMI.  Without any chest pain or evidence of acute coronary syndrome.  Echocardiogram with slightly depressed ejection fraction.  5.  Type 2 diabetes: Was on metformin at home.  Discontinued and currently remains on sliding scale insulin.  6.  Abnormal liver function test: Cause unknown.  Probably due to shock liver or may have chronic hepatitis.  Hepatitis panel is pending from [facility].  We will recheck levels to ensure stabilization.  Avoid Tylenol.  This patient has severe bodily dysfunction, systemic disease, renal failure and liver failure.  He will need inpatient hospitalization management.  Anticipate hospital stay more than 2 midnights.  DVT prophylaxis: Heparin subcu Code Status: Full code Family Communication: None Disposition Plan: Home when is stable Consults called: Nephrology Admission status: Inpatient.   Barb Merino MD [facility] Pager [phone]  If 7PM-7AM, please contact night-coverage [email or web address] Password [facility]  12/19/2018, 4:41 PM

## 2018-12-19 NOTE — Progress Notes (Signed)
Patient arrived to unit in NAD and VS stable.

## 2018-12-19 NOTE — Consult Note (Signed)
[facility]  HISTORY AND PHYSICAL  James [name] is an 43 y.o. male.    Chief Complaint: fall, rhabdo  HPI: Pt is a 23M with EtOH abuse, bipolar disorder, DM II, obesity who is now seen in consultation at the request of Dr. Sloan Leiter for evaluation and recommendations surrouding AKI in setting of rhabdo.  Pt was admitted to [facility] 12/17/2018 after being found down after a day and a half.  Drank excessive EtOH prior to.  Upon arrival, was found to have AKI, metabolic acidosis, leukocytosis, elevated LFTs.  Tylenol and salicylate negative.  CK 58000 --> 130,000 --> 110,000.  Was aggressively hydrated with really no UOP.  + 10 L during that admission.  Baseline Cr 0.7-->2.9 on admission, now up to 3.60 with essentially anuria.  He didn't respond to Lasix challenge of 120 IV.   Was transferred to [facility] in that setting.    Pt reports the development of a rash which is over his torso.  He hurts "all over".  He reports observing social distancing- "that's the reason I drank" and was not in contact with any known COVID-19 positive individuals.    Repeat labs here are pending.  He was on lisinopril, Farxiga, metformin, and mobic as an OP.    PMH: Past Medical History:  Diagnosis Date  . Bipolar 1 disorder ([place])   . Cellulitis 08/24/2012   "RLE and spot on my right forearm" (08/24/2012)  . Type II diabetes mellitus ([place]) ~ 2009   "take Metformin" (08/24/2012)   PSH: Past Surgical History:  Procedure Laterality Date  . EYE SURGERY    . FOOT FRACTURE SURGERY     "left; scaffold broke" 08/24/2012)  . OPEN ANTERIOR SHOULDER RECONSTRUCTION  ~ 2003   "right; kept dislocating; cut it open; sewed muscle back together; moved cartilage around and stapled that" (08/24/2012)    Past Medical History:  Diagnosis Date  . Bipolar 1 disorder ([place])   . Cellulitis 08/24/2012   "RLE and spot on my right forearm" (08/24/2012)  . Type II diabetes mellitus ([place]) ~ 2009   "take Metformin"  (08/24/2012)    Medications:   Scheduled: . folic acid  1 mg Oral Daily  . heparin  5,000 Units Subcutaneous Q8H  . insulin aspart  0-5 Units Subcutaneous QHS  . insulin aspart  0-9 Units Subcutaneous TID WC  . thiamine  100 mg Oral Daily    Medications Prior to Admission  Medication Sig Dispense Refill  . asenapine (SAPHRIS) 5 MG SUBL Place 10 mg under the tongue 2 (two) times daily.     . divalproex (DEPAKOTE ER) 500 MG 24 hr tablet Take 2,000 mg by mouth at bedtime.    Marland Kitchen FLUoxetine (PROZAC) 20 MG capsule Take 40 mg by mouth daily.    Marland Kitchen HYDROmorphone (DILAUDID) 4 MG tablet Take 1 tablet (4 mg total) by mouth every 4 (four) hours as needed for severe pain. 65 tablet 0  . omeprazole (PRILOSEC OTC) 20 MG tablet Take 20 mg by mouth daily.    . ondansetron (ZOFRAN) 4 MG tablet Take 1 tablet (4 mg total) by mouth every 6 (six) hours as needed for nausea. 65 tablet 0  . risperidone (RISPERDAL) 4 MG tablet Take 4 mg by mouth at bedtime.       ALLERGIES:  No Known Allergies  FAM HX: Family History  Problem Relation Age of Onset  . Diabetic kidney disease Maternal Uncle     Social History:   reports  that he has quit smoking. He smoked 0.00 packs per day for 25.00 years. He has never used smokeless tobacco. He reports current alcohol use of about 12.0 standard drinks of alcohol per week. He reports that he does not use drugs.  ROS: ROS: all other systems reviewed and are negative except as per HPI  Blood pressure (!) 160/91, pulse 91, temperature 97.8 F (36.6 C), temperature source Oral, resp. rate (!) 22, height 6\' 3"  (1.905 m), weight 124.6 kg, SpO2 98 %. PHYSICAL EXAM: Physical Exam  GEN: NAD, sitting in bed HEENT EOMI PERRL NECK + JVD PULM clear bilaterally CV RRR no m/r/g ABD distended, some slight abd wall edema EXT 1+ nonpitting edema all 4 extremities NEURO AAO x 3 nonfocal MSK + tenderness but no tight compartments on exam in arms or legs SKIN: maculopapular rash  on torso and neck   No results found for this or any previous visit (from the past 48 hour(s)).  No results found.  Assessment/Plan  1.  Acute oliguric kidney injury: rhabdo and pigment nephropathy in the setting of lisinopril, Farxiga.  Aggressive IVFs.  CKs still elevated but downtrending.  Likely will need dialysis.  Have discussed with pt.  Awaiting labs here.  Will make NPO for AM for nontunneled HD cath with IR. I will also decrease rate of IVFs d/t being 10L positive.    2.  DM II: per primary, was on metformin and Farxiga as OP.  3.  Bipolar/anxiety: per primary  4.  EtOH abuse: RUQ [place] with early cirrhosis, likely EtOH,  Hep panel ordered at [facility].  5.  Elevated troponin- likely d/t rhabdo per cardiology at OSH, no further intervention  6.  Combined systolic and diastolic CHF: EF 09-81%.    7.  Maculopapular rash: folliculitis vs drug rash- monitor  8.  Dispo: inpatient  Madelon Lips 12/19/2018, 4:50 PM

## 2018-12-20 ENCOUNTER — Inpatient Hospital Stay (HOSPITAL_COMMUNITY): Payer: Medicare HMO

## 2018-12-20 ENCOUNTER — Encounter (HOSPITAL_COMMUNITY): Payer: Self-pay | Admitting: Interventional Radiology

## 2018-12-20 HISTORY — PX: IR FLUORO GUIDE CV LINE RIGHT: IMG2283

## 2018-12-20 HISTORY — PX: IR US GUIDE VASC ACCESS RIGHT: IMG2390

## 2018-12-20 LAB — CBC
HCT: 26.2 % — ABNORMAL LOW (ref 39.0–52.0)
Hemoglobin: 9.8 g/dL — ABNORMAL LOW (ref 13.0–17.0)
MCH: 31.9 pg (ref 26.0–34.0)
MCHC: 37.4 g/dL — ABNORMAL HIGH (ref 30.0–36.0)
MCV: 85.3 fL (ref 80.0–100.0)
Platelets: 168 10*3/uL (ref 150–400)
RBC: 3.07 MIL/uL — ABNORMAL LOW (ref 4.22–5.81)
RDW: 12.1 % (ref 11.5–15.5)
WBC: 7.4 10*3/uL (ref 4.0–10.5)
nRBC: 0 % (ref 0.0–0.2)

## 2018-12-20 LAB — COMPREHENSIVE METABOLIC PANEL
ALT: 141 U/L — ABNORMAL HIGH (ref 0–44)
AST: 545 U/L — ABNORMAL HIGH (ref 15–41)
Albumin: 1.9 g/dL — ABNORMAL LOW (ref 3.5–5.0)
Alkaline Phosphatase: 31 U/L — ABNORMAL LOW (ref 38–126)
Anion gap: 8 (ref 5–15)
BUN: 42 mg/dL — ABNORMAL HIGH (ref 6–20)
CO2: 33 mmol/L — ABNORMAL HIGH (ref 22–32)
Calcium: 6.4 mg/dL — CL (ref 8.9–10.3)
Chloride: 92 mmol/L — ABNORMAL LOW (ref 98–111)
Creatinine, Ser: 4.46 mg/dL — ABNORMAL HIGH (ref 0.61–1.24)
GFR calc Af Amer: 17 mL/min — ABNORMAL LOW (ref >60)
GFR calc non Af Amer: 15 mL/min — ABNORMAL LOW (ref >60)
Glucose, Bld: 79 mg/dL (ref 70–99)
Potassium: 3.2 mmol/L — ABNORMAL LOW (ref 3.5–5.1)
Sodium: 133 mmol/L — ABNORMAL LOW (ref 135–145)
Total Bilirubin: 0.6 mg/dL (ref 0.3–1.2)
Total Protein: 3.6 g/dL — ABNORMAL LOW (ref 6.5–8.1)

## 2018-12-20 LAB — URINALYSIS, ROUTINE W REFLEX MICROSCOPIC
Bilirubin Urine: NEGATIVE
Glucose, UA: 50 mg/dL — AB
Ketones, ur: NEGATIVE mg/dL
Nitrite: NEGATIVE
Protein, ur: 100 mg/dL — AB
Specific Gravity, Urine: 1.018 (ref 1.005–1.030)
pH: 5 (ref 5.0–8.0)

## 2018-12-20 LAB — PROTIME-INR
INR: 1.1 (ref 0.8–1.2)
Prothrombin Time: 14.5 seconds (ref 11.4–15.2)

## 2018-12-20 LAB — CK: Total CK: 24528 U/L — ABNORMAL HIGH (ref 49–397)

## 2018-12-20 LAB — HEPATITIS B SURFACE ANTIGEN: Hepatitis B Surface Ag: NEGATIVE

## 2018-12-20 LAB — HIV ANTIBODY (ROUTINE TESTING W REFLEX): HIV Screen 4th Generation wRfx: NONREACTIVE

## 2018-12-20 LAB — GLUCOSE, CAPILLARY
Glucose-Capillary: 86 mg/dL (ref 70–99)
Glucose-Capillary: 84 mg/dL (ref 70–99)

## 2018-12-20 MED ORDER — LIDOCAINE-PRILOCAINE 2.5-2.5 % EX CREA
1.0000 "application " | TOPICAL_CREAM | CUTANEOUS | Status: DC | PRN
  Filled 2018-12-20: qty 5

## 2018-12-20 MED ORDER — PENTAFLUOROPROP-TETRAFLUOROETH EX AERO: 1.0000 "application " | INHALATION_SPRAY | CUTANEOUS | Status: DC | PRN

## 2018-12-20 MED ORDER — SODIUM CHLORIDE 0.9 % IV BOLUS
1000.0000 mL | Freq: Once | INTRAVENOUS | Status: AC
  Administered 2018-12-20: 11:00:00 1000 mL via INTRAVENOUS

## 2018-12-20 MED ORDER — LACTATED RINGERS IV SOLN
INTRAVENOUS | Status: DC
  Administered 2018-12-20: 11:00:00 via INTRAVENOUS

## 2018-12-20 MED ORDER — CALCIUM GLUCONATE-NACL 1-0.675 GM/50ML-% IV SOLN
1.0000 g | Freq: Once | INTRAVENOUS | Status: AC
  Administered 2018-12-20: 1000 mg via INTRAVENOUS
  Filled 2018-12-20: qty 50

## 2018-12-20 MED ORDER — HEPARIN SODIUM (PORCINE) 1000 UNIT/ML IJ SOLN
INTRAMUSCULAR | Status: AC
  Filled 2018-12-20: qty 1

## 2018-12-20 MED ORDER — HEPARIN SODIUM (PORCINE) 1000 UNIT/ML IJ SOLN
INTRAMUSCULAR | Status: AC
  Administered 2018-12-20: 2400 [IU] via INTRAVENOUS_CENTRAL
  Filled 2018-12-20: qty 3

## 2018-12-20 MED ORDER — POTASSIUM CHLORIDE CRYS ER 20 MEQ PO TBCR
40.0000 meq | EXTENDED_RELEASE_TABLET | Freq: Once | ORAL | Status: AC
  Administered 2018-12-20: 40 meq via ORAL
  Filled 2018-12-20: qty 4

## 2018-12-20 MED ORDER — HEPARIN SODIUM (PORCINE) 1000 UNIT/ML DIALYSIS
1000.0000 [IU] | INTRAMUSCULAR | Status: DC | PRN
  Administered 2018-12-20: 16:00:00 2400 [IU] via INTRAVENOUS_CENTRAL
  Administered 2018-12-21 – 2019-01-10 (×2): 1000 [IU] via INTRAVENOUS_CENTRAL
  Administered 2019-01-12 – 2019-01-14 (×2): 3800 [IU] via INTRAVENOUS_CENTRAL
  Filled 2018-12-20 (×6): qty 1

## 2018-12-20 MED ORDER — LIDOCAINE HCL 1 % IJ SOLN
INTRAMUSCULAR | Status: AC
  Filled 2018-12-20: qty 20

## 2018-12-20 MED ORDER — SODIUM CHLORIDE 0.9 % IV SOLN: 100.0000 mL | INTRAVENOUS | Status: DC | PRN

## 2018-12-20 MED ORDER — LIDOCAINE HCL (PF) 1 % IJ SOLN
INTRAMUSCULAR | Status: DC | PRN
  Administered 2018-12-20: 5 mL

## 2018-12-20 MED ORDER — CHLORHEXIDINE GLUCONATE CLOTH 2 % EX PADS
6.0000 | MEDICATED_PAD | Freq: Every day | CUTANEOUS | Status: DC
  Administered 2018-12-20 – 2018-12-27 (×8): 6 via TOPICAL

## 2018-12-20 MED ORDER — CHLORDIAZEPOXIDE HCL 5 MG PO CAPS
15.0000 mg | ORAL_CAPSULE | Freq: Three times a day (TID) | ORAL | Status: DC
  Administered 2018-12-20 – 2018-12-23 (×12): 15 mg via ORAL
  Filled 2018-12-20 (×12): qty 3

## 2018-12-20 MED ORDER — ALTEPLASE 2 MG IJ SOLR
2.0000 mg | Freq: Once | INTRAMUSCULAR | Status: DC | PRN
  Filled 2018-12-20: qty 2

## 2018-12-20 MED ORDER — LIDOCAINE HCL (PF) 1 % IJ SOLN
5.0000 mL | INTRAMUSCULAR | Status: DC | PRN
  Filled 2018-12-20: qty 5

## 2018-12-20 NOTE — Procedures (Signed)
  Procedure: R IJ 20cm HD catheter placed to svc/ra jct EBL:   minimal Complications:  none immediate  See full dictation in [facility].  Dillard Cannon MD Main # [phone] Pager  [phone]

## 2018-12-20 NOTE — Procedures (Signed)
Patient seen and examined on Hemodialysis. BP (!) 169/99   Pulse 83   Temp 97.9 F (36.6 C) (Oral)   Resp 20   Ht 6\' 3"  (1.905 m)   Wt 126.2 kg   SpO2 98%   BMI 34.78 kg/m   QB 200 mL/ min  UF goal 2L  Tolerating treatment without complaints at this time.  Would not give any more IVFs since he's anuric and on HD at this time   Madelon Lips MD [place] pgr [phone] 4:09 PM

## 2018-12-20 NOTE — Progress Notes (Signed)
[facility] Progress Note    Assessment/ Plan:   1.  Acute oliguric kidney injury: rhabdo and pigment nephropathy in the setting of lisinopril, Farxiga.  Aggressive IVFs.  CKs still elevated but downtrending.  Will need dialysis.  S/p nontunneled HD catheter and will do HD #1 today.    2.  DM II: per primary, was on metformin and Farxiga as OP.  3.  Bipolar/anxiety: per primary  4.  EtOH abuse: RUQ [place] with early cirrhosis, likely EtOH,  Hep panel ordered at [facility].  5.  Elevated troponin- likely d/t rhabdo per cardiology at OSH, no further intervention  6.  Combined systolic and diastolic CHF: EF 47-65%.    7.  Maculopapular rash: folliculitis vs drug rash- monitor  8.  Dispo: inpatient  Subjective:    nontunneled HD cath today.  Appreciate IR.     Objective:   BP (!) 145/79 (BP Location: Right Arm)   Pulse 70   Temp 98.6 F (37 C) (Oral)   Resp 18   Ht 6\' 3"  (1.905 m)   Wt 126.2 kg   SpO2 99%   BMI 34.78 kg/m   Intake/Output Summary (Last 24 hours) at 12/20/2018 1128 Last data filed at 12/20/2018 0700 Gross per 24 hour  Intake 1500 ml  Output 200 ml  Net 1300 ml   Weight change:   Physical Exam: GEN: NAD, sitting in bed HEENT EOMI PERRL NECK + JVD PULM clear bilaterally CV RRR no m/r/g ABD distended, some slight abd wall edema EXT 1+ nonpitting edema all 4 extremities NEURO AAO x 3 nonfocal MSK + tenderness but no tight compartments on exam in arms or legs SKIN: maculopapular rash on torso and neck, bruising on forehead and bilateral knees   Imaging: Ir Fluoro Guide Cv Line Right  Result Date: 12/20/2018 CLINICAL DATA:  Rhabdomyolysis, renal failure, needs access for hemodialysis EXAM: EXAM RIGHT IJ CATHETER PLACEMENT UNDER ULTRASOUND AND FLUOROSCOPIC GUIDANCE TECHNIQUE: The procedure, risks (including but not limited to bleeding, infection, organ damage, pneumothorax), benefits, and alternatives were explained to the patient.  Questions regarding the procedure were encouraged and answered. The patient understands and consents to the procedure. Patency of the right IJ vein was confirmed with ultrasound with image documentation. An appropriate skin site was determined. Skin site was marked. Region was prepped using maximum barrier technique including cap and mask, sterile gown, sterile gloves, large sterile sheet, and Chlorhexidine as cutaneous antisepsis. The region was infiltrated locally with 1% lidocaine. Under real-time ultrasound guidance, the right IJ vein was accessed with a 21 gauge needle; the needle tip within the vein was confirmed with ultrasound image documentation. The needle exchanged over a 018 guidewire for vascular dilator which allowed advancement of a 20 cm Mahurkar catheter. This was positioned with the tip at the cavoatrial junction. Spot chest radiograph shows good positioning and no pneumothorax. Catheter was flushed and sutured externally with 0-Prolene sutures. Patient tolerated the procedure well. FLUOROSCOPY TIME:  Less than 0.1 minute; 3 uGym2 DAP COMPLICATIONS: COMPLICATIONS none IMPRESSION: 1. Technically successful right IJ Mahurkar catheter placement. Electronically Signed   By: Lucrezia Europe M.D.   On: 12/20/2018 10:59   Ir US Guide Vasc Access Right  Result Date: 12/20/2018 CLINICAL DATA:  Rhabdomyolysis, renal failure, needs access for hemodialysis EXAM: EXAM RIGHT IJ CATHETER PLACEMENT UNDER ULTRASOUND AND FLUOROSCOPIC GUIDANCE TECHNIQUE: The procedure, risks (including but not limited to bleeding, infection, organ damage, pneumothorax), benefits, and alternatives were explained to the patient. Questions regarding the  procedure were encouraged and answered. The patient understands and consents to the procedure. Patency of the right IJ vein was confirmed with ultrasound with image documentation. An appropriate skin site was determined. Skin site was marked. Region was prepped using maximum barrier  technique including cap and mask, sterile gown, sterile gloves, large sterile sheet, and Chlorhexidine as cutaneous antisepsis. The region was infiltrated locally with 1% lidocaine. Under real-time ultrasound guidance, the right IJ vein was accessed with a 21 gauge needle; the needle tip within the vein was confirmed with ultrasound image documentation. The needle exchanged over a 018 guidewire for vascular dilator which allowed advancement of a 20 cm Mahurkar catheter. This was positioned with the tip at the cavoatrial junction. Spot chest radiograph shows good positioning and no pneumothorax. Catheter was flushed and sutured externally with 0-Prolene sutures. Patient tolerated the procedure well. FLUOROSCOPY TIME:  Less than 0.1 minute; 3 uGym2 DAP COMPLICATIONS: COMPLICATIONS none IMPRESSION: 1. Technically successful right IJ Mahurkar catheter placement. Electronically Signed   By: Lucrezia Europe M.D.   On: 12/20/2018 10:59    Labs: BMET Recent Labs  Lab 12/19/18 1657 12/20/18 0551  NA 134* 133*  K 4.1 3.2*  CL 108 92*  CO2 15* 33*  GLUCOSE 106* 79  BUN 43* 42*  CREATININE 4.45* 4.46*  CALCIUM 7.7* 6.4*  PHOS 4.6  --    CBC Recent Labs  Lab 12/19/18 1657 12/20/18 0607  WBC 11.6* 7.4  NEUTROABS 10.0*  --   HGB 12.7* 9.8*  HCT 35.7* 26.2*  MCV 89.0 85.3  PLT 183 168    Medications:    . chlordiazePOXIDE  15 mg Oral TID  . Chlorhexidine Gluconate Cloth  6 each Topical Q0600  . FLUoxetine  40 mg Oral Daily  . folic acid  1 mg Oral Daily  . heparin      . heparin  5,000 Units Subcutaneous Q8H  . insulin aspart  0-5 Units Subcutaneous QHS  . insulin aspart  0-9 Units Subcutaneous TID WC  . lidocaine      . loxapine  25 mg Oral Daily  . loxapine  50 mg Oral QHS  . risperiDONE  9 mg Oral QHS  . thiamine  100 mg Oral Daily  . topiramate  250 mg Oral QHS  . traZODone  300 mg Oral QHS      Madelon Lips, MD [place] pgr [phone] 12/20/2018, 11:28 AM

## 2018-12-20 NOTE — Progress Notes (Signed)
CRITICAL VALUE ALERT  Critical Value:  6.4  Date & Time Notied:  12/20/2018 @ 8333  Provider Notified: MD notified  Orders Received/Actions taken: Admin 1g calcium gluconate

## 2018-12-20 NOTE — Progress Notes (Signed)
PROGRESS NOTE                                                                                                                                                                                                             Patient Demographics:    James [name], is a 43 y.o. male, DOB - 12/20/1975, KGU:[id number]  Admit date - 12/19/2018   Admitting Physician Mercy Riding, MD  Outpatient Primary MD for the patient is Martinique, Betty G, MD  LOS - 1  CC - Fall     Brief Narrative  James [name] is a 43 y.o. male with medical history 43 year old male with history of hypertension, well controlled NIDDM-2 and bipolar disorder presenting to emergency department via EMS after found down on floor for 2 days. Patient reported drinking liquor on Friday and fell of the bed and could not get up for two days until his mother found him down on the floor. Reported binge drinking alcohol intermittently.    Subjective:    [name] today has, No headache, No chest pain, No abdominal pain - No Nausea, No new weakness tingling or numbness, No Cough - SOB.  Does have generalized aches and pains all over.   Assessment  & Plan :      1.  Mechanical fall, DTs, rhabdomyolysis with anuric acute renal failure.  Counseled to quit alcohol, placed on Librium along with CIWA protocol, will give normal saline bolus followed by LR, stop bicarb drip as serum bicarb is higher than normal, will also check renal ultrasound.  CK trending down.  Will check UA with urine myoglobin.  Monitor BMP closely.  He does have some urine now but appears quite dark and concentrated.  Nephrology on board.  2.  Alcohol abuse.  Counseled to quit.  3.  Hypokalemia and hypocalcemia.  Replaced.  4.  DM type II.  Sliding scale for now.  Diet controlled at home.  Lab Results  Component Value Date   HGBA1C 7.1 (H) 05/12/2014   CBG (last 3)  Recent Labs    12/19/18 1635 12/19/18 2230 12/20/18 0747  GLUCAP 93  107* 86   Lab Results  Component Value Date   HGBA1C 7.1 (H) 05/12/2014     Family Communication  :  None  Code Status :  Full  Disposition Plan  :  TBD  Consults  :  Renal  Procedures  :    Renal US - ordered  DVT Prophylaxis  :  Heparin   Lab Results  Component Value Date   PLT 168 12/20/2018    Diet :  Diet Order            DIET SOFT Room service appropriate? Yes; Fluid consistency: Thin  Diet effective now               Inpatient Medications Scheduled Meds: . chlordiazePOXIDE  15 mg Oral TID  . FLUoxetine  40 mg Oral Daily  . folic acid  1 mg Oral Daily  . heparin      . heparin  5,000 Units Subcutaneous Q8H  . insulin aspart  0-5 Units Subcutaneous QHS  . insulin aspart  0-9 Units Subcutaneous TID WC  . lidocaine      . loxapine  25 mg Oral Daily  . loxapine  50 mg Oral QHS  . potassium chloride  40 mEq Oral Once  . risperiDONE  9 mg Oral QHS  . thiamine  100 mg Oral Daily  . topiramate  250 mg Oral QHS  . traZODone  300 mg Oral QHS   Continuous Infusions: . calcium gluconate    . lactated ringers    . sodium chloride     PRN Meds:.lidocaine (PF), LORazepam, [DISCONTINUED] ondansetron **OR** ondansetron (ZOFRAN) IV, oxyCODONE  Antibiotics  :   Anti-infectives (From admission, onward)   None          Objective:   Vitals:   12/19/18 1554 12/19/18 1951 12/20/18 0430  BP: (!) 160/91 (!) 154/89 (!) 145/79  Pulse: 91 83 70  Resp: (!) 22 18 18   Temp: 97.8 F (36.6 C) 98.6 F (37 C) 98.6 F (37 C)  TempSrc: Oral Oral Oral  SpO2: 98% 98% 99%  Weight: 124.6 kg  126.2 kg  Height: 6\' 3"  (1.905 m)      Wt Readings from Last 3 Encounters:  12/20/18 126.2 kg  05/11/14 133.8 kg  02/18/14 129.3 kg     Intake/Output Summary (Last 24 hours) at 12/20/2018 0917 Last data filed at 12/20/2018 0700 Gross per 24 hour  Intake 1500 ml  Output 200 ml  Net 1300 ml     Physical Exam  Awake but mildly confused and early DTs, fine tremors, No  new F.N deficits,   [place].AT,PERRAL Supple Neck,No JVD, No cervical lymphadenopathy appriciated.  Symmetrical Chest wall movement, Good air movement bilaterally, CTAB RRR,No Gallops,Rubs or new Murmurs, No Parasternal Heave +ve B.Sounds, Abd Soft, No tenderness, No organomegaly appriciated, No rebound - guarding or rigidity. No Cyanosis, Clubbing or edema, No new Rash or bruise       Data Review:    CBC Recent Labs  Lab 12/19/18 1657 12/20/18 0607  WBC 11.6* 7.4  HGB 12.7* 9.8*  HCT 35.7* 26.2*  PLT 183 168  MCV 89.0 85.3  MCH 31.7 31.9  MCHC 35.6 37.4*  RDW 12.3 12.1  LYMPHSABS 0.9  --   MONOABS 0.6  --   EOSABS 0.0  --   BASOSABS 0.0  --     Chemistries  Recent Labs  Lab 12/19/18 1657 12/20/18 0551  NA 134* 133*  K 4.1 3.2*  CL 108 92*  CO2 15* 33*  GLUCOSE 106* 79  BUN 43* 42*  CREATININE 4.45* 4.46*  CALCIUM 7.7* 6.4*  MG 1.7  --   AST 1,035* 545*  ALT 213* 141*  ALKPHOS 41 31*  BILITOT 0.6 0.6   ------------------------------------------------------------------------------------------------------------------ No results for input(s): CHOL, HDL, LDLCALC, TRIG, CHOLHDL, LDLDIRECT in the last  72 hours.  Lab Results  Component Value Date   HGBA1C 7.1 (H) 05/12/2014   ------------------------------------------------------------------------------------------------------------------ Recent Labs    12/19/18 1657  TSH 2.361   ------------------------------------------------------------------------------------------------------------------ No results for input(s): VITAMINB12, FOLATE, FERRITIN, TIBC, IRON, RETICCTPCT in the last 72 hours.  Coagulation profile Recent Labs  Lab 12/20/18 0728  INR 1.1    No results for input(s): DDIMER in the last 72 hours.  Cardiac Enzymes No results for input(s): CKMB, TROPONINI, MYOGLOBIN in the last 168 hours.  Invalid input(s): CK  ------------------------------------------------------------------------------------------------------------------ No results found for: BNP  Micro Results No results found for this or any previous visit (from the past 240 hour(s)).  Radiology Reports No results found.  Time Spent in minutes  30   Lala Lund M.D on 12/20/2018 at 9:17 AM  To page go to [email or web address] - password [facility]

## 2018-12-21 LAB — COMPREHENSIVE METABOLIC PANEL
ALT: 146 U/L — ABNORMAL HIGH (ref 0–44)
AST: 531 U/L — ABNORMAL HIGH (ref 15–41)
Albumin: 2.1 g/dL — ABNORMAL LOW (ref 3.5–5.0)
Alkaline Phosphatase: 43 U/L (ref 38–126)
Anion gap: 7 (ref 5–15)
BUN: 40 mg/dL — ABNORMAL HIGH (ref 6–20)
CO2: 20 mmol/L — ABNORMAL LOW (ref 22–32)
Calcium: 7.6 mg/dL — ABNORMAL LOW (ref 8.9–10.3)
Chloride: 104 mmol/L (ref 98–111)
Creatinine, Ser: 5.54 mg/dL — ABNORMAL HIGH (ref 0.61–1.24)
GFR calc Af Amer: 13 mL/min — ABNORMAL LOW (ref >60)
GFR calc non Af Amer: 11 mL/min — ABNORMAL LOW (ref >60)
Glucose, Bld: 85 mg/dL (ref 70–99)
Potassium: 3.9 mmol/L (ref 3.5–5.1)
Sodium: 131 mmol/L — ABNORMAL LOW (ref 135–145)
Total Bilirubin: 1 mg/dL (ref 0.3–1.2)
Total Protein: 4.4 g/dL — ABNORMAL LOW (ref 6.5–8.1)

## 2018-12-21 LAB — GLUCOSE, CAPILLARY
Glucose-Capillary: 101 mg/dL — ABNORMAL HIGH (ref 70–99)
Glucose-Capillary: 85 mg/dL (ref 70–99)
Glucose-Capillary: 109 mg/dL — ABNORMAL HIGH (ref 70–99)
Glucose-Capillary: 102 mg/dL — ABNORMAL HIGH (ref 70–99)
Glucose-Capillary: 129 mg/dL — ABNORMAL HIGH (ref 70–99)

## 2018-12-21 LAB — CBC
HCT: 29.1 % — ABNORMAL LOW (ref 39.0–52.0)
Hemoglobin: 10 g/dL — ABNORMAL LOW (ref 13.0–17.0)
MCH: 30.1 pg (ref 26.0–34.0)
MCHC: 34.4 g/dL (ref 30.0–36.0)
MCV: 87.7 fL (ref 80.0–100.0)
Platelets: 193 10*3/uL (ref 150–400)
RBC: 3.32 MIL/uL — ABNORMAL LOW (ref 4.22–5.81)
RDW: 12 % (ref 11.5–15.5)
WBC: 7.7 10*3/uL (ref 4.0–10.5)
nRBC: 0 % (ref 0.0–0.2)

## 2018-12-21 LAB — CK: Total CK: 28554 U/L — ABNORMAL HIGH (ref 49–397)

## 2018-12-21 LAB — HEPATITIS B CORE ANTIBODY, TOTAL: Hep B Core Total Ab: NEGATIVE

## 2018-12-21 LAB — HEPATITIS B SURFACE ANTIBODY,QUALITATIVE: Hep B S Ab: NONREACTIVE

## 2018-12-21 LAB — MYOGLOBIN, URINE: Myoglobin, Ur: 2291 ng/mL — ABNORMAL HIGH (ref 0–13)

## 2018-12-21 MED ORDER — HEPARIN SODIUM (PORCINE) 1000 UNIT/ML IJ SOLN
INTRAMUSCULAR | Status: AC
  Administered 2018-12-21: 1000 [IU] via INTRAVENOUS_CENTRAL
  Filled 2018-12-21: qty 3

## 2018-12-21 NOTE — Progress Notes (Signed)
PROGRESS NOTE  James [name]  YWV:[id number] DOB: 01/07/1976 DOA: 12/19/2018 PCP: Martinique, Betty G, MD   Brief Narrative: James [name] is a 43 y.o. male with a history of HTN, NIDT2DM, and bipolar disorder who presented to the [place] 4/4 after being found down for 2 days following binge drinking alcohol. He was found to have AKI, metabolic acidosis, leukocytosis and LFT elevation. CK 58k. Aggressive IVF rehydration was given with significant edema and insignificant urine output. CK rose to 130k, creatinine 0.7 > 2.9 > 3.6 and no significant diuresis with lasix 120mg  IV. He was transferred to [facility] 4/6, creatinine trending up to 5.54. Tunneled HD cath inserted and HD initiated per nephrology on 4/7.   Assessment & Plan: Principal Problem:   Acute renal failure due to traumatic rhabdomyolysis ([name]) Active Problems:   Diabetes mellitus ([place])   Bipolar affective disorder ([place])   Alcoholism ([place])  Acute renal failure due to rhabdomyolysis: Relatively unremarkable renal U/S - HD per nephrology - Monitor I/O - Holding lisinopril  Alcohol abuse:  - Monitor for withdrawal - Cessation counseling provided  Elevated LFTs: Due to rhabdo and EtOH. INR 1.1. HepB negative, nonimmune, HIV NR, TSH 2.361. - Trend - RUQ U/S demonstrates early cirrhotic changes.   T2DM:  - Continue SSI, at inpatient goal without insulin thus far. - Holding home medications including metformin  Elevated troponin: Due to muscle breakdown and impaired clearance. No further interventions planned.  Maculopapular rash: Improved today.  - Monitor, supportive care  Obesity: BMI 35.   DVT prophylaxis: Heparin Code Status: Full Family Communication: None at bedside Disposition Plan: Home once condition stabilized  Consultants:   Nephrology  Procedures:   Tunneled HD catheter insertion and HD 4/7  Antimicrobials:  None   Subjective: Swollen all over after IV fluids, improved a bit after HD  yesterday.  Objective: Vitals:   12/21/18 0451 12/21/18 0607 12/21/18 0737 12/21/18 1154  BP: (!) 157/98  (!) 154/84 (!) 167/95  Pulse: 83  79 80  Resp:   18 18  Temp: 98.6 F (37 C)  98.4 F (36.9 C) 98.2 F (36.8 C)  TempSrc: Oral  Oral Oral  SpO2: 97%  95% 97%  Weight:  127.3 kg    Height:        Intake/Output Summary (Last 24 hours) at 12/21/2018 1340 Last data filed at 12/21/2018 8546 Gross per 24 hour  Intake 690 ml  Output 2150 ml  Net -1460 ml   Filed Weights   12/19/18 1554 12/20/18 0430 12/21/18 0607  Weight: 124.6 kg 126.2 kg 127.3 kg    Gen: 43 y.o. male in no distress  Pulm: Non-labored breathing. Clear to auscultation bilaterally.  CV: Regular rate and rhythm. No murmur, rub, or gallop. + JVD, diffuse edema. GI: Abdomen w/edema, non-tender, non-distended, with normoactive bowel sounds. No organomegaly or masses felt. Ext: Warm, no deformities Skin: Bruising on knees, mild diffuse maculopapular rash. Neuro: Alert and oriented. No focal neurological deficits. Psych: Judgement and insight appear normal. Mood & affect appropriate.   Data Reviewed: I have personally reviewed following labs and imaging studies  CBC: Recent Labs  Lab 12/19/18 1657 12/20/18 0607 12/21/18 0229  WBC 11.6* 7.4 7.7  NEUTROABS 10.0*  --   --   HGB 12.7* 9.8* 10.0*  HCT 35.7* 26.2* 29.1*  MCV 89.0 85.3 87.7  PLT 183 168 270   Basic Metabolic Panel: Recent Labs  Lab 12/19/18 1657 12/20/18 0551 12/21/18 0229  NA 134* 133* 131*  K 4.1 3.2* 3.9  CL 108 92* 104  CO2 15* 33* 20*  GLUCOSE 106* 79 85  BUN 43* 42* 40*  CREATININE 4.45* 4.46* 5.54*  CALCIUM 7.7* 6.4* 7.6*  MG 1.7  --   --   PHOS 4.6  --   --    GFR: Estimated Creatinine Clearance: 24.2 mL/min (A) (by C-G formula based on SCr of 5.54 mg/dL (H)). Liver Function Tests: Recent Labs  Lab 12/19/18 1657 12/20/18 0551 12/21/18 0229  AST 1,035* 545* 531*  ALT 213* 141* 146*  ALKPHOS 41 31* 43  BILITOT 0.6  0.6 1.0  PROT 5.2* 3.6* 4.4*  ALBUMIN 2.7* 1.9* 2.1*   No results for input(s): LIPASE, AMYLASE in the last 168 hours. No results for input(s): AMMONIA in the last 168 hours. Coagulation Profile: Recent Labs  Lab 12/20/18 0728  INR 1.1   Cardiac Enzymes: Recent Labs  Lab 12/19/18 1657 12/20/18 0551 12/21/18 0229  CKTOTAL >50,000* 24,528* 28,554*   BNP (last 3 results) No results for input(s): PROBNP in the last 8760 hours. HbA1C: No results for input(s): HGBA1C in the last 72 hours. CBG: Recent Labs  Lab 12/20/18 0747 12/20/18 1710 12/20/18 2308 12/21/18 0738 12/21/18 1154  GLUCAP 86 84 101* 85 109*   Lipid Profile: No results for input(s): CHOL, HDL, LDLCALC, TRIG, CHOLHDL, LDLDIRECT in the last 72 hours. Thyroid Function Tests: Recent Labs    12/19/18 1657  TSH 2.361   Anemia Panel: No results for input(s): VITAMINB12, FOLATE, FERRITIN, TIBC, IRON, RETICCTPCT in the last 72 hours. Urine analysis:    Component Value Date/Time   COLORURINE AMBER (A) 12/20/2018 0919   APPEARANCEUR HAZY (A) 12/20/2018 0919   LABSPEC 1.018 12/20/2018 0919   PHURINE 5.0 12/20/2018 0919   GLUCOSEU 50 (A) 12/20/2018 0919   HGBUR LARGE (A) 12/20/2018 0919   BILIRUBINUR NEGATIVE 12/20/2018 0919   KETONESUR NEGATIVE 12/20/2018 0919   PROTEINUR 100 (A) 12/20/2018 0919   UROBILINOGEN 1.0 05/11/2014 0921   NITRITE NEGATIVE 12/20/2018 0919   LEUKOCYTESUR LARGE (A) 12/20/2018 0919   No results found for this or any previous visit (from the past 240 hour(s)).    Radiology Studies: US Renal  Result Date: 12/20/2018 CLINICAL DATA:  43 year old male with a history of renal failure EXAM: RENAL / URINARY TRACT ULTRASOUND COMPLETE COMPARISON:  Ultrasound 12/18/2018, CT 10/25/2012 FINDINGS: Right Kidney: Length: 13.9 cm x 7.5 cm x 6.5 cm, 356 cc. No hydronephrosis. Relatively unremarkable echotexture of the right kidney. Flow confirmed in the hilum of the right kidney Left Kidney: Length:  13.3 cm x 7.0 cm x 8.0 cm, 391 cc. No hydronephrosis. Relatively unremarkable echotexture of the left kidney. Flow confirmed in the hilum of the left kidney. Bladder: Not visualized IMPRESSION: No hydronephrosis, with relatively unremarkable appearance of the kidneys. Electronically Signed   By: Corrie Mckusick D.O.   On: 12/20/2018 12:53   Ir Fluoro Guide Cv Line Right  Result Date: 12/20/2018 CLINICAL DATA:  Rhabdomyolysis, renal failure, needs access for hemodialysis EXAM: EXAM RIGHT IJ CATHETER PLACEMENT UNDER ULTRASOUND AND FLUOROSCOPIC GUIDANCE TECHNIQUE: The procedure, risks (including but not limited to bleeding, infection, organ damage, pneumothorax), benefits, and alternatives were explained to the patient. Questions regarding the procedure were encouraged and answered. The patient understands and consents to the procedure. Patency of the right IJ vein was confirmed with ultrasound with image documentation. An appropriate skin site was determined. Skin site was marked. Region was prepped using maximum barrier technique including cap and  mask, sterile gown, sterile gloves, large sterile sheet, and Chlorhexidine as cutaneous antisepsis. The region was infiltrated locally with 1% lidocaine. Under real-time ultrasound guidance, the right IJ vein was accessed with a 21 gauge needle; the needle tip within the vein was confirmed with ultrasound image documentation. The needle exchanged over a 018 guidewire for vascular dilator which allowed advancement of a 20 cm Mahurkar catheter. This was positioned with the tip at the cavoatrial junction. Spot chest radiograph shows good positioning and no pneumothorax. Catheter was flushed and sutured externally with 0-Prolene sutures. Patient tolerated the procedure well. FLUOROSCOPY TIME:  Less than 0.1 minute; 3 uGym2 DAP COMPLICATIONS: COMPLICATIONS none IMPRESSION: 1. Technically successful right IJ Mahurkar catheter placement. Electronically Signed   By: Lucrezia Europe  M.D.   On: 12/20/2018 10:59   Ir US Guide Vasc Access Right  Result Date: 12/20/2018 CLINICAL DATA:  Rhabdomyolysis, renal failure, needs access for hemodialysis EXAM: EXAM RIGHT IJ CATHETER PLACEMENT UNDER ULTRASOUND AND FLUOROSCOPIC GUIDANCE TECHNIQUE: The procedure, risks (including but not limited to bleeding, infection, organ damage, pneumothorax), benefits, and alternatives were explained to the patient. Questions regarding the procedure were encouraged and answered. The patient understands and consents to the procedure. Patency of the right IJ vein was confirmed with ultrasound with image documentation. An appropriate skin site was determined. Skin site was marked. Region was prepped using maximum barrier technique including cap and mask, sterile gown, sterile gloves, large sterile sheet, and Chlorhexidine as cutaneous antisepsis. The region was infiltrated locally with 1% lidocaine. Under real-time ultrasound guidance, the right IJ vein was accessed with a 21 gauge needle; the needle tip within the vein was confirmed with ultrasound image documentation. The needle exchanged over a 018 guidewire for vascular dilator which allowed advancement of a 20 cm Mahurkar catheter. This was positioned with the tip at the cavoatrial junction. Spot chest radiograph shows good positioning and no pneumothorax. Catheter was flushed and sutured externally with 0-Prolene sutures. Patient tolerated the procedure well. FLUOROSCOPY TIME:  Less than 0.1 minute; 3 uGym2 DAP COMPLICATIONS: COMPLICATIONS none IMPRESSION: 1. Technically successful right IJ Mahurkar catheter placement. Electronically Signed   By: Lucrezia Europe M.D.   On: 12/20/2018 10:59    Scheduled Meds:  chlordiazePOXIDE  15 mg Oral TID   Chlorhexidine Gluconate Cloth  6 each Topical Q0600   FLUoxetine  40 mg Oral Daily   folic acid  1 mg Oral Daily   heparin  5,000 Units Subcutaneous Q8H   insulin aspart  0-5 Units Subcutaneous QHS   insulin aspart   0-9 Units Subcutaneous TID WC   loxapine  25 mg Oral Daily   loxapine  50 mg Oral QHS   risperiDONE  9 mg Oral QHS   thiamine  100 mg Oral Daily   topiramate  250 mg Oral QHS   traZODone  300 mg Oral QHS   Continuous Infusions:  sodium chloride     sodium chloride       LOS: 2 days   Time spent: 25 minutes.  Patrecia Pour, MD [facility] [email or web address] Password [email or web address] 12/21/2018, 1:40 PM

## 2018-12-21 NOTE — Progress Notes (Signed)
[facility] Progress Note    Assessment/ Plan:   1.  Acute oliguric kidney injury: rhabdo and pigment nephropathy in the setting of lisinopril, Farxiga.  Aggressive IVFs d/c'd since on HD now.  CKs still elevated but downtrending.  HD #1 yesterday, HD #2 today.    2.  DM II: per primary, was on metformin and Farxiga as OP.  3.  Bipolar/anxiety: per primary  4.  EtOH abuse: RUQ [place] with early cirrhosis, likely EtOH,  Hep panel ordered at [place], [place] protocol  5.  Elevated troponin- likely d/t rhabdo per cardiology at OSH, no further intervention  6.  Combined systolic and diastolic CHF: EF 98-92%.    7.  Maculopapular rash: folliculitis vs drug rash- monitor  8.  Dispo: inpatient, pending renal function  Subjective:    350 mL urine yesterday.  HD #2 today.  Still feels "tight".     Objective:   BP (!) 175/105   Pulse 76   Temp 98.2 F (36.8 C) (Oral)   Resp 18   Ht 6\' 3"  (1.905 m)   Wt 128 kg   SpO2 98%   BMI 35.27 kg/m   Intake/Output Summary (Last 24 hours) at 12/21/2018 1510 Last data filed at 12/21/2018 1194 Gross per 24 hour  Intake 690 ml  Output 2150 ml  Net -1460 ml   Weight change: 2.7 kg  Physical Exam: GEN: NAD, sitting in bed HEENT EOMI PERRL NECK + JVD, improved PULM clear bilaterally CV RRR no m/r/g ABD distended, + abd wall edema EXT 1+ nonpitting edema all 4 extremities NEURO AAO x 3 nonfocal MSK + tenderness but no tight compartments on exam in arms or legs SKIN: maculopapular rash on torso and neck, bruising on forehead and bilateral knees   Imaging: US Renal  Result Date: 12/20/2018 CLINICAL DATA:  43 year old male with a history of renal failure EXAM: RENAL / URINARY TRACT ULTRASOUND COMPLETE COMPARISON:  Ultrasound 12/18/2018, CT 10/25/2012 FINDINGS: Right Kidney: Length: 13.9 cm x 7.5 cm x 6.5 cm, 356 cc. No hydronephrosis. Relatively unremarkable echotexture of the right kidney. Flow confirmed in the hilum of the  right kidney Left Kidney: Length: 13.3 cm x 7.0 cm x 8.0 cm, 391 cc. No hydronephrosis. Relatively unremarkable echotexture of the left kidney. Flow confirmed in the hilum of the left kidney. Bladder: Not visualized IMPRESSION: No hydronephrosis, with relatively unremarkable appearance of the kidneys. Electronically Signed   By: Corrie Mckusick D.O.   On: 12/20/2018 12:53   Ir Fluoro Guide Cv Line Right  Result Date: 12/20/2018 CLINICAL DATA:  Rhabdomyolysis, renal failure, needs access for hemodialysis EXAM: EXAM RIGHT IJ CATHETER PLACEMENT UNDER ULTRASOUND AND FLUOROSCOPIC GUIDANCE TECHNIQUE: The procedure, risks (including but not limited to bleeding, infection, organ damage, pneumothorax), benefits, and alternatives were explained to the patient. Questions regarding the procedure were encouraged and answered. The patient understands and consents to the procedure. Patency of the right IJ vein was confirmed with ultrasound with image documentation. An appropriate skin site was determined. Skin site was marked. Region was prepped using maximum barrier technique including cap and mask, sterile gown, sterile gloves, large sterile sheet, and Chlorhexidine as cutaneous antisepsis. The region was infiltrated locally with 1% lidocaine. Under real-time ultrasound guidance, the right IJ vein was accessed with a 21 gauge needle; the needle tip within the vein was confirmed with ultrasound image documentation. The needle exchanged over a 018 guidewire for vascular dilator which allowed advancement of a 20 cm Mahurkar catheter. This  was positioned with the tip at the cavoatrial junction. Spot chest radiograph shows good positioning and no pneumothorax. Catheter was flushed and sutured externally with 0-Prolene sutures. Patient tolerated the procedure well. FLUOROSCOPY TIME:  Less than 0.1 minute; 3 uGym2 DAP COMPLICATIONS: COMPLICATIONS none IMPRESSION: 1. Technically successful right IJ Mahurkar catheter placement.  Electronically Signed   By: Lucrezia Europe M.D.   On: 12/20/2018 10:59   Ir US Guide Vasc Access Right  Result Date: 12/20/2018 CLINICAL DATA:  Rhabdomyolysis, renal failure, needs access for hemodialysis EXAM: EXAM RIGHT IJ CATHETER PLACEMENT UNDER ULTRASOUND AND FLUOROSCOPIC GUIDANCE TECHNIQUE: The procedure, risks (including but not limited to bleeding, infection, organ damage, pneumothorax), benefits, and alternatives were explained to the patient. Questions regarding the procedure were encouraged and answered. The patient understands and consents to the procedure. Patency of the right IJ vein was confirmed with ultrasound with image documentation. An appropriate skin site was determined. Skin site was marked. Region was prepped using maximum barrier technique including cap and mask, sterile gown, sterile gloves, large sterile sheet, and Chlorhexidine as cutaneous antisepsis. The region was infiltrated locally with 1% lidocaine. Under real-time ultrasound guidance, the right IJ vein was accessed with a 21 gauge needle; the needle tip within the vein was confirmed with ultrasound image documentation. The needle exchanged over a 018 guidewire for vascular dilator which allowed advancement of a 20 cm Mahurkar catheter. This was positioned with the tip at the cavoatrial junction. Spot chest radiograph shows good positioning and no pneumothorax. Catheter was flushed and sutured externally with 0-Prolene sutures. Patient tolerated the procedure well. FLUOROSCOPY TIME:  Less than 0.1 minute; 3 uGym2 DAP COMPLICATIONS: COMPLICATIONS none IMPRESSION: 1. Technically successful right IJ Mahurkar catheter placement. Electronically Signed   By: Lucrezia Europe M.D.   On: 12/20/2018 10:59    Labs: BMET Recent Labs  Lab 12/19/18 1657 12/20/18 0551 12/21/18 0229  NA 134* 133* 131*  K 4.1 3.2* 3.9  CL 108 92* 104  CO2 15* 33* 20*  GLUCOSE 106* 79 85  BUN 43* 42* 40*  CREATININE 4.45* 4.46* 5.54*  CALCIUM 7.7* 6.4*  7.6*  PHOS 4.6  --   --    CBC Recent Labs  Lab 12/19/18 1657 12/20/18 0607 12/21/18 0229  WBC 11.6* 7.4 7.7  NEUTROABS 10.0*  --   --   HGB 12.7* 9.8* 10.0*  HCT 35.7* 26.2* 29.1*  MCV 89.0 85.3 87.7  PLT 183 168 193    Medications:    . chlordiazePOXIDE  15 mg Oral TID  . Chlorhexidine Gluconate Cloth  6 each Topical Q0600  . FLUoxetine  40 mg Oral Daily  . folic acid  1 mg Oral Daily  . heparin  5,000 Units Subcutaneous Q8H  . insulin aspart  0-5 Units Subcutaneous QHS  . insulin aspart  0-9 Units Subcutaneous TID WC  . loxapine  25 mg Oral Daily  . loxapine  50 mg Oral QHS  . risperiDONE  9 mg Oral QHS  . thiamine  100 mg Oral Daily  . topiramate  250 mg Oral QHS  . traZODone  300 mg Oral QHS      Madelon Lips, MD [facility] pgr [phone] 12/21/2018, 3:10 PM

## 2018-12-21 NOTE — Procedures (Signed)
Patient seen and examined on Hemodialysis. BP (!) 175/105   Pulse 76   Temp 98.2 F (36.8 C) (Oral)   Resp 18   Ht 6\' 3"  (1.905 m)   Wt 128 kg   SpO2 98%   BMI 35.27 kg/m   QB 250 mL/ min, UF goal 2.5L  Doing OK.  Hopeful for renal recovery.    Tolerating treatment without complaints at this time.   Madelon Lips MD  [facility] pgr [phone] 3:13 PM

## 2018-12-22 DIAGNOSIS — E1169 Type 2 diabetes mellitus with other specified complication: Secondary | ICD-10-CM

## 2018-12-22 LAB — CBC
HCT: 29.6 % — ABNORMAL LOW (ref 39.0–52.0)
Hemoglobin: 10.2 g/dL — ABNORMAL LOW (ref 13.0–17.0)
MCH: 31.8 pg (ref 26.0–34.0)
MCHC: 34.5 g/dL (ref 30.0–36.0)
MCV: 92.2 fL (ref 80.0–100.0)
Platelets: 121 10*3/uL — ABNORMAL LOW (ref 150–400)
RBC: 3.21 MIL/uL — ABNORMAL LOW (ref 4.22–5.81)
RDW: 12.2 % (ref 11.5–15.5)
WBC: 7 10*3/uL (ref 4.0–10.5)
nRBC: 0 % (ref 0.0–0.2)

## 2018-12-22 LAB — COMPREHENSIVE METABOLIC PANEL
ALT: 131 U/L — ABNORMAL HIGH (ref 0–44)
AST: 382 U/L — ABNORMAL HIGH (ref 15–41)
Albumin: 2.1 g/dL — ABNORMAL LOW (ref 3.5–5.0)
Alkaline Phosphatase: 57 U/L (ref 38–126)
Anion gap: 11 (ref 5–15)
BUN: 34 mg/dL — ABNORMAL HIGH (ref 6–20)
CO2: 20 mmol/L — ABNORMAL LOW (ref 22–32)
Calcium: 7.9 mg/dL — ABNORMAL LOW (ref 8.9–10.3)
Chloride: 100 mmol/L (ref 98–111)
Creatinine, Ser: 5.54 mg/dL — ABNORMAL HIGH (ref 0.61–1.24)
GFR calc Af Amer: 13 mL/min — ABNORMAL LOW (ref >60)
GFR calc non Af Amer: 11 mL/min — ABNORMAL LOW (ref >60)
Glucose, Bld: 86 mg/dL (ref 70–99)
Potassium: 5.4 mmol/L — ABNORMAL HIGH (ref 3.5–5.1)
Sodium: 131 mmol/L — ABNORMAL LOW (ref 135–145)
Total Bilirubin: 2 mg/dL — ABNORMAL HIGH (ref 0.3–1.2)
Total Protein: 4.3 g/dL — ABNORMAL LOW (ref 6.5–8.1)

## 2018-12-22 LAB — GLUCOSE, CAPILLARY
Glucose-Capillary: 117 mg/dL — ABNORMAL HIGH (ref 70–99)
Glucose-Capillary: 66 mg/dL — ABNORMAL LOW (ref 70–99)
Glucose-Capillary: 90 mg/dL (ref 70–99)
Glucose-Capillary: 99 mg/dL (ref 70–99)

## 2018-12-22 MED ORDER — OXYCODONE HCL 5 MG PO TABS
ORAL_TABLET | ORAL | Status: AC
  Filled 2018-12-22: qty 1

## 2018-12-22 MED ORDER — HEPARIN SODIUM (PORCINE) 1000 UNIT/ML IJ SOLN
INTRAMUSCULAR | Status: AC
  Filled 2018-12-22: qty 3

## 2018-12-22 NOTE — Procedures (Signed)
Patient was seen on dialysis and the procedure was supervised.  BFR 300  Via vascath BP is  174/101.   Patient appears to be tolerating treatment well  Louis Meckel 12/22/2018

## 2018-12-22 NOTE — Progress Notes (Signed)
PT Cancellation Note  Patient Details Name: James [name] MRN: [id number] DOB: November 10, 1975   Cancelled Treatment:    Reason Eval/Treat Not Completed: Patient at procedure or test/unavailable (HD). PT will continue to f/u with pt acutely as available for evaluation.    [place] 12/22/2018, 12:14 PM

## 2018-12-22 NOTE — Progress Notes (Signed)
[name] Progress Note    Assessment/ Plan:   1.  Acute oliguric kidney injury: rhabdo and pigment nephropathy in the setting of lisinopril, Farxiga.  Aggressive IVFs d/c'd since on HD now.  CKs still elevated but downtrending.  HD #1 4/7, HD #2 4/8, HD #3 4/9.  2.  DM II: per primary, was on metformin and Farxiga as OP.  3.  Bipolar/anxiety: per primary  4.  EtOH abuse: RUQ [place] with early cirrhosis, likely EtOH,  Hep panel ordered at [facility], Hep B negative, CIWA protocol  5.  Elevated troponin- likely d/t rhabdo per cardiology at OSH, no further intervention  6.  Combined systolic and diastolic CHF: EF 59-56%.    7.  Maculopapular rash: folliculitis vs drug rash- monitor, supportive care.    8.  Dispo: inpatient, pending renal function  Subjective:    Still volume overloaded with hyponatremia.  Will do HD again today.     Objective:   BP (!) 157/90 (BP Location: Right Arm)   Pulse 81   Temp 99.2 F (37.3 C) (Oral)   Resp 19   Ht 6\' 3"  (1.905 m)   Wt 125.5 kg   SpO2 95%   BMI 34.58 kg/m   Intake/Output Summary (Last 24 hours) at 12/22/2018 3875 Last data filed at 12/21/2018 1840 Gross per 24 hour  Intake -  Output 2050 ml  Net -2050 ml   Weight change: 0.7 kg  Physical Exam: GEN: NAD, sitting in bed HEENT EOMI PERRL NECK + JVD, improved PULM clear bilaterally CV RRR no m/r/g ABD distended, + abd wall edema EXT 1+ nonpitting edema all 4 extremities NEURO AAO x 3 nonfocal MSK + tenderness but no tight compartments on exam in arms or legs SKIN: maculopapular rash on torso and neck, bruising on forehead and bilateral knees, R temple with maculopapular rash too  Imaging: US Renal  Result Date: 12/20/2018 CLINICAL DATA:  43 year old male with a history of renal failure EXAM: RENAL / URINARY TRACT ULTRASOUND COMPLETE COMPARISON:  Ultrasound 12/18/2018, CT 10/25/2012 FINDINGS: Right Kidney: Length: 13.9 cm x 7.5 cm x 6.5 cm, 356 cc. No  hydronephrosis. Relatively unremarkable echotexture of the right kidney. Flow confirmed in the hilum of the right kidney Left Kidney: Length: 13.3 cm x 7.0 cm x 8.0 cm, 391 cc. No hydronephrosis. Relatively unremarkable echotexture of the left kidney. Flow confirmed in the hilum of the left kidney. Bladder: Not visualized IMPRESSION: No hydronephrosis, with relatively unremarkable appearance of the kidneys. Electronically Signed   By: Corrie Mckusick D.O.   On: 12/20/2018 12:53    Labs: BMET Recent Labs  Lab 12/19/18 1657 12/20/18 0551 12/21/18 0229 12/22/18 0457  NA 134* 133* 131* 131*  K 4.1 3.2* 3.9 5.4*  CL 108 92* 104 100  CO2 15* 33* 20* 20*  GLUCOSE 106* 79 85 86  BUN 43* 42* 40* 34*  CREATININE 4.45* 4.46* 5.54* 5.54*  CALCIUM 7.7* 6.4* 7.6* 7.9*  PHOS 4.6  --   --   --    CBC Recent Labs  Lab 12/19/18 1657 12/20/18 0607 12/21/18 0229 12/22/18 0457  WBC 11.6* 7.4 7.7 7.0  NEUTROABS 10.0*  --   --   --   HGB 12.7* 9.8* 10.0* 10.2*  HCT 35.7* 26.2* 29.1* 29.6*  MCV 89.0 85.3 87.7 92.2  PLT 183 168 193 121*    Medications:    . chlordiazePOXIDE  15 mg Oral TID  . Chlorhexidine Gluconate Cloth  6 each Topical  I1537  . FLUoxetine  40 mg Oral Daily  . folic acid  1 mg Oral Daily  . heparin  5,000 Units Subcutaneous Q8H  . insulin aspart  0-5 Units Subcutaneous QHS  . insulin aspart  0-9 Units Subcutaneous TID WC  . loxapine  25 mg Oral Daily  . loxapine  50 mg Oral QHS  . risperiDONE  9 mg Oral QHS  . thiamine  100 mg Oral Daily  . topiramate  250 mg Oral QHS  . traZODone  300 mg Oral QHS      Madelon Lips, MD [facility] pgr [phone] 12/22/2018, 9:39 AM

## 2018-12-22 NOTE — Progress Notes (Signed)
Patient ID: James [name], male   DOB: [birth year], 43 y.o.   MRN: [id number]  PROGRESS NOTE    Mehtab Dolberry Pinales  CBS:[id number] DOB: April 30, 1976 DOA: 12/19/2018 PCP: Martinique, Betty G, MD   Brief Narrative:  43 year old male with history of hypertension, diabetes mellitus type 2, bipolar disorder presented on 12/17/2018 to [facility] after being found down for 2 days following binge alcohol drinking.  He was found to have AKI, metabolic acidosis, leukocytosis and LFT elevation with rhabdomyolysis, CK of 58,000.  He was treated with aggressive IV fluids but unfortunately his renal function got worse with decreased urine output and CK rose to 130,000.  No improvement with IV diuresis.  He was transferred to [facility] on 12/19/2018.  Nephrology was consulted.  Tunneled HD catheter was inserted and hemodialysis was started on 12/20/2018.  Assessment & Plan:   Principal Problem:   Acute renal failure due to traumatic rhabdomyolysis [facility]) Active Problems:   Diabetes mellitus ([place])   Bipolar affective disorder ([place])   Alcoholism ([place])  Acute renal failure due to rhabdomyolysis -Renal ultrasound was unremarkable -Nephrology following.  Tunneled HD catheter was inserted and hemodialysis was started on 12/20/2018.  He underwent hemodialysis again on 12/21/2018.  Follow nephrology recommendations.  Alcohol abuse -Monitor for withdrawal.  Patient was counseled regarding alcohol cessation  Elevated LFTs -From rhabdomyolysis and alcohol abuse.  Hepatitis panel negative, HIV nonreactive -Improving.  Monitor.  Right upper quadrant ultrasound demonstrates early cirrhotic changes  Diabetes mellitus type 2 -Continue CBGs with exercise.  Home oral meds on hold  Elevated troponin -Due to muscle breakdown and impaired clearance.  No further interventions planned  Maculopapular rash -Improved.  Obesity -Outpatient follow-up   DVT prophylaxis: Heparin Code Status: Full Family  Communication: None at bedside Disposition Plan: Depends on clinical outcome Consultants: Nephrology  Procedures: Tunneled HD catheter insertion on 12/20/2018  Antimicrobials: None   Subjective: Patient seen and examined at bedside.  Denies any overnight fever, nausea, vomiting or worsening shortness of breath.  Complains of swelling all over.  Objective: Vitals:   12/22/18 0500 12/22/18 0628 12/22/18 0630 12/22/18 0810  BP:  (!) 180/96 (!) 177/89 (!) 157/90  Pulse:  85 88 81  Resp:  19  19  Temp:  99.3 F (37.4 C)  99.2 F (37.3 C)  TempSrc:  Oral  Oral  SpO2:  96%  95%  Weight: 125.5 kg     Height:        Intake/Output Summary (Last 24 hours) at 12/22/2018 0950 Last data filed at 12/22/2018 0942 Gross per 24 hour  Intake 244 ml  Output 2050 ml  Net -1806 ml   Filed Weights   12/21/18 1340 12/21/18 1650 12/22/18 0500  Weight: 128 kg 125.4 kg 125.5 kg    Examination:  General exam: Appears calm and comfortable.  No distress Respiratory system: Bilateral decreased breath sounds at bases, some scattered crackles Cardiovascular system: S1 & S2 heard, Rate controlled Gastrointestinal system: Abdomen is nondistended, soft and nontender. Normal bowel sounds heard. Extremities: No cyanosis, clubbing; lower extremity edema present   Data Reviewed: I have personally reviewed following labs and imaging studies  CBC: Recent Labs  Lab 12/19/18 1657 12/20/18 0607 12/21/18 0229 12/22/18 0457  WBC 11.6* 7.4 7.7 7.0  NEUTROABS 10.0*  --   --   --   HGB 12.7* 9.8* 10.0* 10.2*  HCT 35.7* 26.2* 29.1* 29.6*  MCV 89.0 85.3 87.7 92.2  PLT 183 168 193 121*  Basic Metabolic Panel: Recent Labs  Lab 12/19/18 1657 12/20/18 0551 12/21/18 0229 12/22/18 0457  NA 134* 133* 131* 131*  K 4.1 3.2* 3.9 5.4*  CL 108 92* 104 100  CO2 15* 33* 20* 20*  GLUCOSE 106* 79 85 86  BUN 43* 42* 40* 34*  CREATININE 4.45* 4.46* 5.54* 5.54*  CALCIUM 7.7* 6.4* 7.6* 7.9*  MG 1.7  --   --   --    PHOS 4.6  --   --   --    GFR: Estimated Creatinine Clearance: 24 mL/min (A) (by C-G formula based on SCr of 5.54 mg/dL (H)). Liver Function Tests: Recent Labs  Lab 12/19/18 1657 12/20/18 0551 12/21/18 0229 12/22/18 0457  AST 1,035* 545* 531* 382*  ALT 213* 141* 146* 131*  ALKPHOS 41 31* 43 57  BILITOT 0.6 0.6 1.0 2.0*  PROT 5.2* 3.6* 4.4* 4.3*  ALBUMIN 2.7* 1.9* 2.1* 2.1*   No results for input(s): LIPASE, AMYLASE in the last 168 hours. No results for input(s): AMMONIA in the last 168 hours. Coagulation Profile: Recent Labs  Lab 12/20/18 0728  INR 1.1   Cardiac Enzymes: Recent Labs  Lab 12/19/18 1657 12/20/18 0551 12/21/18 0229  CKTOTAL >50,000* 24,528* 28,554*   BNP (last 3 results) No results for input(s): PROBNP in the last 8760 hours. HbA1C: No results for input(s): HGBA1C in the last 72 hours. CBG: Recent Labs  Lab 12/21/18 0738 12/21/18 1154 12/21/18 1820 12/21/18 2135 12/22/18 0810  GLUCAP 85 109* 102* 129* 117*   Lipid Profile: No results for input(s): CHOL, HDL, LDLCALC, TRIG, CHOLHDL, LDLDIRECT in the last 72 hours. Thyroid Function Tests: Recent Labs    12/19/18 1657  TSH 2.361   Anemia Panel: No results for input(s): VITAMINB12, FOLATE, FERRITIN, TIBC, IRON, RETICCTPCT in the last 72 hours. Sepsis Labs: No results for input(s): PROCALCITON, LATICACIDVEN in the last 168 hours.  No results found for this or any previous visit (from the past 240 hour(s)).       Radiology Studies: US Renal  Result Date: 12/20/2018 CLINICAL DATA:  43 year old male with a history of renal failure EXAM: RENAL / URINARY TRACT ULTRASOUND COMPLETE COMPARISON:  Ultrasound 12/18/2018, CT 10/25/2012 FINDINGS: Right Kidney: Length: 13.9 cm x 7.5 cm x 6.5 cm, 356 cc. No hydronephrosis. Relatively unremarkable echotexture of the right kidney. Flow confirmed in the hilum of the right kidney Left Kidney: Length: 13.3 cm x 7.0 cm x 8.0 cm, 391 cc. No hydronephrosis.  Relatively unremarkable echotexture of the left kidney. Flow confirmed in the hilum of the left kidney. Bladder: Not visualized IMPRESSION: No hydronephrosis, with relatively unremarkable appearance of the kidneys. Electronically Signed   By: Corrie Mckusick D.O.   On: 12/20/2018 12:53        Scheduled Meds: . chlordiazePOXIDE  15 mg Oral TID  . Chlorhexidine Gluconate Cloth  6 each Topical Q0600  . FLUoxetine  40 mg Oral Daily  . folic acid  1 mg Oral Daily  . heparin  5,000 Units Subcutaneous Q8H  . insulin aspart  0-5 Units Subcutaneous QHS  . insulin aspart  0-9 Units Subcutaneous TID WC  . loxapine  25 mg Oral Daily  . loxapine  50 mg Oral QHS  . risperiDONE  9 mg Oral QHS  . thiamine  100 mg Oral Daily  . topiramate  250 mg Oral QHS  . traZODone  300 mg Oral QHS   Continuous Infusions: . sodium chloride    . sodium chloride  LOS: 3 days        Aline August, MD [facility] 12/22/2018, 9:50 AM

## 2018-12-23 LAB — COMPREHENSIVE METABOLIC PANEL
ALT: 116 U/L — ABNORMAL HIGH (ref 0–44)
AST: 299 U/L — ABNORMAL HIGH (ref 15–41)
Albumin: 2.1 g/dL — ABNORMAL LOW (ref 3.5–5.0)
Alkaline Phosphatase: 54 U/L (ref 38–126)
Anion gap: 11 (ref 5–15)
BUN: 28 mg/dL — ABNORMAL HIGH (ref 6–20)
CO2: 24 mmol/L (ref 22–32)
Calcium: 8.1 mg/dL — ABNORMAL LOW (ref 8.9–10.3)
Chloride: 97 mmol/L — ABNORMAL LOW (ref 98–111)
Creatinine, Ser: 5.02 mg/dL — ABNORMAL HIGH (ref 0.61–1.24)
GFR calc Af Amer: 15 mL/min — ABNORMAL LOW (ref >60)
GFR calc non Af Amer: 13 mL/min — ABNORMAL LOW (ref >60)
Glucose, Bld: 87 mg/dL (ref 70–99)
Potassium: 4 mmol/L (ref 3.5–5.1)
Sodium: 132 mmol/L — ABNORMAL LOW (ref 135–145)
Total Bilirubin: 0.4 mg/dL (ref 0.3–1.2)
Total Protein: 4.7 g/dL — ABNORMAL LOW (ref 6.5–8.1)

## 2018-12-23 LAB — CBC
HCT: 27.8 % — ABNORMAL LOW (ref 39.0–52.0)
Hemoglobin: 9.4 g/dL — ABNORMAL LOW (ref 13.0–17.0)
MCH: 30.4 pg (ref 26.0–34.0)
MCHC: 33.8 g/dL (ref 30.0–36.0)
MCV: 90 fL (ref 80.0–100.0)
Platelets: 208 10*3/uL (ref 150–400)
RBC: 3.09 MIL/uL — ABNORMAL LOW (ref 4.22–5.81)
RDW: 12.2 % (ref 11.5–15.5)
WBC: 7 10*3/uL (ref 4.0–10.5)
nRBC: 0 % (ref 0.0–0.2)

## 2018-12-23 LAB — GLUCOSE, CAPILLARY
Glucose-Capillary: 89 mg/dL (ref 70–99)
Glucose-Capillary: 115 mg/dL — ABNORMAL HIGH (ref 70–99)
Glucose-Capillary: 107 mg/dL — ABNORMAL HIGH (ref 70–99)
Glucose-Capillary: 98 mg/dL (ref 70–99)

## 2018-12-23 LAB — MAGNESIUM: Magnesium: 1.7 mg/dL (ref 1.7–2.4)

## 2018-12-23 MED ORDER — SODIUM CHLORIDE 0.9% FLUSH: 10.0000 mL | INTRAVENOUS | Status: DC | PRN

## 2018-12-23 NOTE — Evaluation (Signed)
Physical Therapy Evaluation Patient Details Name: James [name] MRN: [id number] DOB: Jun 12, 1976 Today's Date: 12/23/2018   History of Present Illness  Pt is a 43 y/o male admitted after fall. Found to have acute renal failure secondary to rhabdomyolysis. Pt is s/p tunneled HD catheter and started HD on 4/7. PMH includes DM, bipolar disorder, HTN, and alcohol abuse.   Clinical Impression  Pt admitted secondary to problem above with deficits below. Pt requiring min to min guard A for mobility tasks this session. Pt reporting tightness and swelling in BLE which limited ambulation tolerance. Anticipate pt will progress well and be able to d/c home with HHPT. However, if pt unable to progress, may require short term SNF. Will continue to follow acutely to maximize functional mobility independence and safety.     Follow Up Recommendations Home health PT;Supervision for mobility/OOB(HHPT pending progression )    Equipment Recommendations  Rolling walker with 5" wheels    Recommendations for Other Services       Precautions / Restrictions Precautions Precautions: Fall Restrictions Weight Bearing Restrictions: No      Mobility  Bed Mobility Overal bed mobility: Needs Assistance Bed Mobility: Supine to Sit;Sit to Supine     Supine to sit: Mod assist Sit to supine: Min assist   General bed mobility comments: Mod A for LE assist and trunk elevation. Min A for LE assist to return to supine.   Transfers Overall transfer level: Needs assistance Equipment used: 1 person hand held assist Transfers: Sit to/from Stand Sit to Stand: Min assist         General transfer comment: Min A for steadying assist. PT stood in front of pt and held pt's hands for steadying assist. Pt reports tightness in BLE upon standing.   Ambulation/Gait Ambulation/Gait assistance: Min guard Gait Distance (Feet): 20 Feet Assistive device: 1 person hand held assist Gait Pattern/deviations: Step-through  pattern;Decreased stride length;Shuffle Gait velocity: Decreased    General Gait Details: Slow, waddle type gait. No RW available in room, therefore PT stood in front of pt and held Pts hands for steadying. No overt LOB noted.   Stairs            Wheelchair Mobility    Modified Rankin (Stroke Patients Only)       Balance Overall balance assessment: Needs assistance Sitting-balance support: No upper extremity supported;Feet supported Sitting balance-Leahy Scale: Good     Standing balance support: Bilateral upper extremity supported;During functional activity Standing balance-Leahy Scale: Poor Standing balance comment: Reliant on BUE support                              Pertinent Vitals/Pain Pain Assessment: Faces Faces Pain Scale: Hurts little more Pain Location: BLE  Pain Descriptors / Indicators: Aching;Guarding Pain Intervention(s): Limited activity within patient's tolerance;Monitored during session;Repositioned    Home Living Family/patient expects to be discharged to:: Private residence Living Arrangements: Alone Available Help at Discharge: Other (Comment)(reports he does not have anyone to check in ) Type of Home: House Home Access: Level entry     Home Layout: Two level Home Equipment: None      Prior Function Level of Independence: Independent               Hand Dominance        Extremity/Trunk Assessment   Upper Extremity Assessment Upper Extremity Assessment: Defer to OT evaluation    Lower Extremity Assessment Lower Extremity Assessment: LLE  deficits/detail;RLE deficits/detail RLE Deficits / Details: Pt reports swelling in BLE. Noted wounds on B knees. Pt reports stiffness in BLE which limited ROM.  LLE Deficits / Details: Pt reports swelling in BLE. Noted wounds on B knees. Pt reports stiffness in BLE which limited ROM.    Cervical / Trunk Assessment Cervical / Trunk Assessment: Normal  Communication    Communication: No difficulties  Cognition Arousal/Alertness: Awake/alert Behavior During Therapy: WFL for tasks assessed/performed Overall Cognitive Status: Within Functional Limits for tasks assessed                                        General Comments      Exercises     Assessment/Plan    PT Assessment Patient needs continued PT services  PT Problem List Decreased strength;Decreased balance;Decreased mobility;Decreased knowledge of use of DME;Decreased knowledge of precautions       PT Treatment Interventions DME instruction;Gait training;Stair training;Functional mobility training;Therapeutic activities;Therapeutic exercise;Balance training;Patient/family education    PT Goals (Current goals can be found in the Care Plan section)  Acute Rehab PT Goals Patient Stated Goal: to get better PT Goal Formulation: With patient Time For Goal Achievement: 01/06/19 Potential to Achieve Goals: Good    Frequency Min 3X/week   Barriers to discharge Decreased caregiver support      Co-evaluation               AM-PAC PT "6 Clicks" Mobility  Outcome Measure Help needed turning from your back to your side while in a flat bed without using bedrails?: A Lot Help needed moving from lying on your back to sitting on the side of a flat bed without using bedrails?: A Lot Help needed moving to and from a bed to a chair (including a wheelchair)?: A Little Help needed standing up from a chair using your arms (e.g., wheelchair or bedside chair)?: A Little Help needed to walk in hospital room?: A Little Help needed climbing 3-5 steps with a railing? : A Lot 6 Click Score: 15    End of Session Equipment Utilized During Treatment: Gait belt Activity Tolerance: Patient tolerated treatment well Patient left: in bed;with call bell/phone within reach;with bed alarm set Nurse Communication: Mobility status PT Visit Diagnosis: Other abnormalities of gait and mobility  (R26.89);Unsteadiness on feet (R26.81);Muscle weakness (generalized) (M62.81)    Time: 0950-1006 PT Time Calculation (min) (ACUTE ONLY): 16 min   Charges:   PT Evaluation $PT Eval Low Complexity: [place], PT, DPT  Acute Rehabilitation Services  Pager: [phone] Office: [phone]   Rudean Hitt 12/23/2018, 12:41 PM

## 2018-12-23 NOTE — Progress Notes (Signed)
Patient ID: James [name], male   DOB: December 30, 1975, 43 y.o.   MRN: [id number]  PROGRESS NOTE    Dyshaun Bonzo Gasparro  NWG:[id number] DOB: [birth year] DOA: 12/19/2018 PCP: Martinique, Betty G, MD   Brief Narrative:  43 year old male with history of hypertension, diabetes mellitus type 2, bipolar disorder presented on 12/17/2018 to [facility] after being found down for 2 days following binge alcohol drinking.  He was found to have AKI, metabolic acidosis, leukocytosis and LFT elevation with rhabdomyolysis, CK of 58,000.  He was treated with aggressive IV fluids but unfortunately his renal function got worse with decreased urine output and CK rose to 130,000.  No improvement with IV diuresis.  He was transferred to [facility] on 12/19/2018.  Nephrology was consulted.  Tunneled HD catheter was inserted and hemodialysis was started on 12/20/2018.  Assessment & Plan:   Principal Problem:   Acute renal failure due to traumatic rhabdomyolysis [facility]) Active Problems:   Diabetes mellitus ([place])   Bipolar affective disorder ([place])   Alcoholism ([place])  Acute renal failure due to rhabdomyolysis -Renal ultrasound was unremarkable -Nephrology following.  Tunneled HD catheter was inserted and hemodialysis was started on 12/20/2018.  Follow nephrology recommendations.  Alcohol abuse -Monitor for withdrawal.  Currently no signs of withdrawal.  Patient was counseled regarding alcohol cessation  Elevated LFTs -From rhabdomyolysis and alcohol abuse.  Hepatitis panel negative, HIV nonreactive -Improving.  Monitor.  Right upper quadrant ultrasound demonstrates early cirrhotic changes  Diabetes mellitus type 2 -Continue CBGs with exercise.  Home oral meds on hold  Elevated troponin -Due to muscle breakdown and impaired clearance.  No further interventions planned  Maculopapular rash -Improved.  Obesity -Outpatient follow-up  Bipolar disorder/anxiety -continue current regimen.  Outpatient  follow-up with psychiatry   DVT prophylaxis: Heparin Code Status: Full Family Communication: None at bedside Disposition Plan: Depends on clinical outcome Consultants: Nephrology  Procedures: Tunneled HD catheter insertion on 12/20/2018  Antimicrobials: None   Subjective: Patient seen and examined at bedside.  No overnight fever, nausea or vomiting.  No worsening abdominal pain.  Objective: Vitals:   12/22/18 2122 12/22/18 2123 12/23/18 0500 12/23/18 0658  BP: (!) 163/94 (!) 163/95  (!) 152/86  Pulse: 73 72  67  Resp: 18   18  Temp: 99.9 F (37.7 C)   99 F (37.2 C)  TempSrc: Oral   Oral  SpO2: 95%   93%  Weight:   124.6 kg   Height:        Intake/Output Summary (Last 24 hours) at 12/23/2018 0921 Last data filed at 12/23/2018 0611 Gross per 24 hour  Intake 484 ml  Output 3700 ml  Net -3216 ml   Filed Weights   12/22/18 1120 12/22/18 1457 12/23/18 0500  Weight: 127.6 kg 124.2 kg 124.6 kg    Examination:  General exam: Appears calm and comfortable.  No acute distress.  Poor historian Respiratory system: Bilateral decreased breath sounds at bases, some scattered crackles.  No wheezing Cardiovascular system: S1 & S2 heard, Rate controlled Gastrointestinal system: Abdomen is nondistended, soft and nontender. Normal bowel sounds heard. Extremities: No cyanosis; lower extremity edema present   Data Reviewed: I have personally reviewed following labs and imaging studies  CBC: Recent Labs  Lab 12/19/18 1657 12/20/18 0607 12/21/18 0229 12/22/18 0457 12/23/18 0213  WBC 11.6* 7.4 7.7 7.0 7.0  NEUTROABS 10.0*  --   --   --   --   HGB 12.7* 9.8* 10.0* 10.2* 9.4*  HCT 35.7*  26.2* 29.1* 29.6* 27.8*  MCV 89.0 85.3 87.7 92.2 90.0  PLT 183 168 193 121* 915   Basic Metabolic Panel: Recent Labs  Lab 12/19/18 1657 12/20/18 0551 12/21/18 0229 12/22/18 0457 12/23/18 0213  NA 134* 133* 131* 131* 132*  K 4.1 3.2* 3.9 5.4* 4.0  CL 108 92* 104 100 97*  CO2 15* 33* 20*  20* 24  GLUCOSE 106* 79 85 86 87  BUN 43* 42* 40* 34* 28*  CREATININE 4.45* 4.46* 5.54* 5.54* 5.02*  CALCIUM 7.7* 6.4* 7.6* 7.9* 8.1*  MG 1.7  --   --   --  1.7  PHOS 4.6  --   --   --   --    GFR: Estimated Creatinine Clearance: 26.4 mL/min (A) (by C-G formula based on SCr of 5.02 mg/dL (H)). Liver Function Tests: Recent Labs  Lab 12/19/18 1657 12/20/18 0551 12/21/18 0229 12/22/18 0457 12/23/18 0213  AST 1,035* 545* 531* 382* 299*  ALT 213* 141* 146* 131* 116*  ALKPHOS 41 31* 43 57 54  BILITOT 0.6 0.6 1.0 2.0* 0.4  PROT 5.2* 3.6* 4.4* 4.3* 4.7*  ALBUMIN 2.7* 1.9* 2.1* 2.1* 2.1*   No results for input(s): LIPASE, AMYLASE in the last 168 hours. No results for input(s): AMMONIA in the last 168 hours. Coagulation Profile: Recent Labs  Lab 12/20/18 0728  INR 1.1   Cardiac Enzymes: Recent Labs  Lab 12/19/18 1657 12/20/18 0551 12/21/18 0229  CKTOTAL >50,000* 24,528* 28,554*   BNP (last 3 results) No results for input(s): PROBNP in the last 8760 hours. HbA1C: No results for input(s): HGBA1C in the last 72 hours. CBG: Recent Labs  Lab 12/22/18 0810 12/22/18 1650 12/22/18 1730 12/22/18 2124 12/23/18 0836  GLUCAP 117* 66* 90 99 89   Lipid Profile: No results for input(s): CHOL, HDL, LDLCALC, TRIG, CHOLHDL, LDLDIRECT in the last 72 hours. Thyroid Function Tests: No results for input(s): TSH, T4TOTAL, FREET4, T3FREE, THYROIDAB in the last 72 hours. Anemia Panel: No results for input(s): VITAMINB12, FOLATE, FERRITIN, TIBC, IRON, RETICCTPCT in the last 72 hours. Sepsis Labs: No results for input(s): PROCALCITON, LATICACIDVEN in the last 168 hours.  No results found for this or any previous visit (from the past 240 hour(s)).       Radiology Studies: No results found.      Scheduled Meds: . chlordiazePOXIDE  15 mg Oral TID  . Chlorhexidine Gluconate Cloth  6 each Topical Q0600  . FLUoxetine  40 mg Oral Daily  . folic acid  1 mg Oral Daily  . heparin   5,000 Units Subcutaneous Q8H  . insulin aspart  0-5 Units Subcutaneous QHS  . insulin aspart  0-9 Units Subcutaneous TID WC  . loxapine  25 mg Oral Daily  . loxapine  50 mg Oral QHS  . risperiDONE  9 mg Oral QHS  . thiamine  100 mg Oral Daily  . topiramate  250 mg Oral QHS  . traZODone  300 mg Oral QHS   Continuous Infusions: . sodium chloride    . sodium chloride       LOS: 4 days        Aline August, MD [facility] 12/23/2018, 9:21 AM

## 2018-12-23 NOTE — Care Management Important Message (Signed)
Important Message  Patient Details  Name: James [name] MRN: [id number] Date of Birth: 11/04/1975   Medicare Important Message Given:  Yes    Jermisha Hoffart 12/23/2018, 3:00 PM

## 2018-12-23 NOTE — Progress Notes (Addendum)
[facility] Progress Note    Assessment/ Plan:   1.  Acute oliguric kidney injury: rhabdo and pigment nephropathy in the setting of lisinopril, Farxiga.  Aggressive IVFs d/c'd since on HD now.  CKs still elevated but downtrending.  HD #1 4/7, HD #2 4/8, HD #3 4/9. Next planned HD 4/11.  Some increasing UOP, follow for recovery  2.  DM II: per primary, was on metformin and Farxiga as OP.  3.  Bipolar/anxiety: per primary  4.  EtOH abuse: RUQ [place] with early cirrhosis, likely EtOH,  Hep panel ordered at [facility], Hep B negative, CIWA protocol  5.  Elevated troponin- likely d/t rhabdo per cardiology at OSH, no further intervention   6.  Combined systolic and diastolic CHF: EF 85-02%.    7.  Maculopapular rash: folliculitis vs drug rash- monitor, supportive care.    8.  Dispo: inpatient, pending renal function  Subjective:    Feeling better.  Reports L wrist pain at site of IV that was removed.     Objective:   BP (!) 152/86 (BP Location: Right Arm)   Pulse 67   Temp 99 F (37.2 C) (Oral)   Resp 18   Ht 6\' 3"  (1.905 m)   Wt 124.6 kg   SpO2 93%   BMI 34.33 kg/m   Intake/Output Summary (Last 24 hours) at 12/23/2018 1050 Last data filed at 12/23/2018 0611 Gross per 24 hour  Intake 240 ml  Output 3700 ml  Net -3460 ml   Weight change: -0.4 kg  Physical Exam: GEN: NAD, sitting in bed HEENT EOMI PERRL NECK + JVD, improved PULM clear bilaterally CV RRR no m/r/g ABD distended, + abd wall edema EXT 1+ nonpitting edema all 4 extremities NEURO AAO x 3 nonfocal MSK + tenderness but no tight compartments on exam in arms or legs SKIN: maculopapular rash on torso and neck, bruising on forehead and bilateral knees, R temple with maculopapular rash too  L dorsal wrist with fluid-filled bulla and some surrounding erythema.  ACCESS: R IJ nontunneled HD catheter  Imaging: No results found.  Labs: BMET Recent Labs  Lab 12/19/18 1657 12/20/18 0551  12/21/18 0229 12/22/18 0457 12/23/18 0213  NA 134* 133* 131* 131* 132*  K 4.1 3.2* 3.9 5.4* 4.0  CL 108 92* 104 100 97*  CO2 15* 33* 20* 20* 24  GLUCOSE 106* 79 85 86 87  BUN 43* 42* 40* 34* 28*  CREATININE 4.45* 4.46* 5.54* 5.54* 5.02*  CALCIUM 7.7* 6.4* 7.6* 7.9* 8.1*  PHOS 4.6  --   --   --   --    CBC Recent Labs  Lab 12/19/18 1657 12/20/18 0607 12/21/18 0229 12/22/18 0457 12/23/18 0213  WBC 11.6* 7.4 7.7 7.0 7.0  NEUTROABS 10.0*  --   --   --   --   HGB 12.7* 9.8* 10.0* 10.2* 9.4*  HCT 35.7* 26.2* 29.1* 29.6* 27.8*  MCV 89.0 85.3 87.7 92.2 90.0  PLT 183 168 193 121* 208    Medications:    . chlordiazePOXIDE  15 mg Oral TID  . Chlorhexidine Gluconate Cloth  6 each Topical Q0600  . FLUoxetine  40 mg Oral Daily  . folic acid  1 mg Oral Daily  . heparin  5,000 Units Subcutaneous Q8H  . insulin aspart  0-5 Units Subcutaneous QHS  . insulin aspart  0-9 Units Subcutaneous TID WC  . loxapine  25 mg Oral Daily  . loxapine  50 mg Oral QHS  .  risperiDONE  9 mg Oral QHS  . thiamine  100 mg Oral Daily  . topiramate  250 mg Oral QHS  . traZODone  300 mg Oral QHS      Madelon Lips, MD [place] pgr [phone] 12/23/2018, 10:50 AM

## 2018-12-24 LAB — MAGNESIUM: Magnesium: 1.8 mg/dL (ref 1.7–2.4)

## 2018-12-24 LAB — CBC
HCT: 28.7 % — ABNORMAL LOW (ref 39.0–52.0)
Hemoglobin: 9.8 g/dL — ABNORMAL LOW (ref 13.0–17.0)
MCH: 31.5 pg (ref 26.0–34.0)
MCHC: 34.1 g/dL (ref 30.0–36.0)
MCV: 92.3 fL (ref 80.0–100.0)
Platelets: 239 10*3/uL (ref 150–400)
RBC: 3.11 MIL/uL — ABNORMAL LOW (ref 4.22–5.81)
RDW: 12.3 % (ref 11.5–15.5)
WBC: 6.8 10*3/uL (ref 4.0–10.5)
nRBC: 0 % (ref 0.0–0.2)

## 2018-12-24 LAB — COMPREHENSIVE METABOLIC PANEL
ALT: 96 U/L — ABNORMAL HIGH (ref 0–44)
AST: 196 U/L — ABNORMAL HIGH (ref 15–41)
Albumin: 2 g/dL — ABNORMAL LOW (ref 3.5–5.0)
Alkaline Phosphatase: 48 U/L (ref 38–126)
Anion gap: 14 (ref 5–15)
BUN: 38 mg/dL — ABNORMAL HIGH (ref 6–20)
CO2: 23 mmol/L (ref 22–32)
Calcium: 8.5 mg/dL — ABNORMAL LOW (ref 8.9–10.3)
Chloride: 92 mmol/L — ABNORMAL LOW (ref 98–111)
Creatinine, Ser: 6.96 mg/dL — ABNORMAL HIGH (ref 0.61–1.24)
GFR calc Af Amer: 10 mL/min — ABNORMAL LOW (ref >60)
GFR calc non Af Amer: 9 mL/min — ABNORMAL LOW (ref >60)
Glucose, Bld: 82 mg/dL (ref 70–99)
Potassium: 4 mmol/L (ref 3.5–5.1)
Sodium: 129 mmol/L — ABNORMAL LOW (ref 135–145)
Total Bilirubin: 0.4 mg/dL (ref 0.3–1.2)
Total Protein: 4.6 g/dL — ABNORMAL LOW (ref 6.5–8.1)

## 2018-12-24 LAB — GLUCOSE, CAPILLARY
Glucose-Capillary: 94 mg/dL (ref 70–99)
Glucose-Capillary: 91 mg/dL (ref 70–99)
Glucose-Capillary: 115 mg/dL — ABNORMAL HIGH (ref 70–99)
Glucose-Capillary: 141 mg/dL — ABNORMAL HIGH (ref 70–99)

## 2018-12-24 LAB — CK: Total CK: 8634 U/L — ABNORMAL HIGH (ref 49–397)

## 2018-12-24 MED ORDER — HEPARIN SODIUM (PORCINE) 1000 UNIT/ML IJ SOLN
INTRAMUSCULAR | Status: AC
  Filled 2018-12-24: qty 3

## 2018-12-24 MED ORDER — HEPARIN BOLUS VIA INFUSION
2800.0000 [IU] | Freq: Once | INTRAVENOUS | Status: AC
  Administered 2018-12-24: 17:00:00 2800 [IU] via INTRAVENOUS_CENTRAL

## 2018-12-24 MED ORDER — OXYCODONE HCL 5 MG PO TABS
ORAL_TABLET | ORAL | Status: AC
  Filled 2018-12-24: qty 1

## 2018-12-24 MED ORDER — AMLODIPINE BESYLATE 10 MG PO TABS
10.0000 mg | ORAL_TABLET | Freq: Every day | ORAL | Status: DC
  Administered 2018-12-24 – 2019-01-15 (×21): 10 mg via ORAL
  Filled 2018-12-24 (×22): qty 1

## 2018-12-24 MED ORDER — HYDRALAZINE HCL 20 MG/ML IJ SOLN
10.0000 mg | Freq: Four times a day (QID) | INTRAMUSCULAR | Status: DC | PRN
  Administered 2018-12-24 – 2018-12-25 (×3): 10 mg via INTRAVENOUS
  Filled 2018-12-24 (×3): qty 1

## 2018-12-24 MED ORDER — CHLORDIAZEPOXIDE HCL 5 MG PO CAPS
10.0000 mg | ORAL_CAPSULE | Freq: Three times a day (TID) | ORAL | Status: DC
  Administered 2018-12-24 (×3): 10 mg via ORAL
  Filled 2018-12-24 (×3): qty 2

## 2018-12-24 MED ORDER — ACETAMINOPHEN 325 MG PO TABS
650.0000 mg | ORAL_TABLET | Freq: Once | ORAL | Status: AC
  Administered 2018-12-24: 23:00:00 650 mg via ORAL
  Filled 2018-12-24: qty 2

## 2018-12-24 NOTE — Progress Notes (Signed)
[facility] Progress Note    Assessment/ Plan:   1.  Acute oliguric kidney injury: rhabdo and pigment nephropathy in the setting of lisinopril, Farxiga.  Aggressive IVFs d/c'd since on HD now.  CKs still elevated but downtrending.  HD #1 4/7, HD #2 4/8, HD #3 4/9. Next planned HD 4/11 with healthy UF goal.  Some increasing UOP, follow for recovery  2.  DM II: per primary, was on metformin and Farxiga as OP.  3.  Bipolar/anxiety: per primary  4.  EtOH abuse: RUQ [place] with early cirrhosis, likely EtOH,  Hep panel ordered at [facility], Hep B negative, CIWA protocol  5.  Elevated troponin- likely d/t rhabdo per cardiology at OSH, no further intervention   6.  Combined systolic and diastolic CHF: EF 86-76%.    7.  Maculopapular rash: folliculitis vs drug rash- monitor, supportive care.  8.  HTN: amlodipine added 4/11, agree    9.  Dispo: inpatient, pending renal function  Subjective:    Feels "tight".  BP up.  Walked with PT yesterday.  For HD today.     Objective:   BP (!) 188/64 (BP Location: Right Wrist)   Pulse 83   Temp 98 F (36.7 C)   Resp (!) 23   Ht 6\' 3"  (1.905 m)   Wt 127.7 kg   SpO2 96%   BMI 35.19 kg/m   Intake/Output Summary (Last 24 hours) at 12/24/2018 1123 Last data filed at 12/24/2018 0900 Gross per 24 hour  Intake 120 ml  Output -  Net 120 ml   Weight change: 0.1 kg  Physical Exam: GEN: NAD, sitting in bed HEENT EOMI PERRL NECK + JVD, improved PULM clear bilaterally CV RRR no m/r/g ABD distended, + abd wall edema EXT 1+ nonpitting edema all 4 extremities NEURO AAO x 3 nonfocal MSK + tenderness but no tight compartments on exam in arms or legs SKIN: maculopapular rash on torso and neck, bruising on forehead and bilateral knees, R temple with maculopapular rash too  L dorsal wrist with fluid-filled bulla and some surrounding erythema.  ACCESS: R IJ nontunneled HD catheter  Imaging: No results found.  Labs: BMET Recent Labs   Lab 12/19/18 1657 12/20/18 0551 12/21/18 0229 12/22/18 0457 12/23/18 0213 12/24/18 0418  NA 134* 133* 131* 131* 132* 129*  K 4.1 3.2* 3.9 5.4* 4.0 4.0  CL 108 92* 104 100 97* 92*  CO2 15* 33* 20* 20* 24 23  GLUCOSE 106* 79 85 86 87 82  BUN 43* 42* 40* 34* 28* 38*  CREATININE 4.45* 4.46* 5.54* 5.54* 5.02* 6.96*  CALCIUM 7.7* 6.4* 7.6* 7.9* 8.1* 8.5*  PHOS 4.6  --   --   --   --   --    CBC Recent Labs  Lab 12/19/18 1657  12/21/18 0229 12/22/18 0457 12/23/18 0213 12/24/18 0418  WBC 11.6*   < > 7.7 7.0 7.0 6.8  NEUTROABS 10.0*  --   --   --   --   --   HGB 12.7*   < > 10.0* 10.2* 9.4* 9.8*  HCT 35.7*   < > 29.1* 29.6* 27.8* 28.7*  MCV 89.0   < > 87.7 92.2 90.0 92.3  PLT 183   < > 193 121* 208 239   < > = values in this interval not displayed.    Medications:    . amLODipine  10 mg Oral Daily  . chlordiazePOXIDE  10 mg Oral TID  . Chlorhexidine Gluconate  Cloth  6 each Topical [id number]  . FLUoxetine  40 mg Oral Daily  . folic acid  1 mg Oral Daily  . heparin  5,000 Units Subcutaneous Q8H  . insulin aspart  0-5 Units Subcutaneous QHS  . insulin aspart  0-9 Units Subcutaneous TID WC  . loxapine  25 mg Oral Daily  . loxapine  50 mg Oral QHS  . risperiDONE  9 mg Oral QHS  . thiamine  100 mg Oral Daily  . topiramate  250 mg Oral QHS  . traZODone  300 mg Oral QHS      Madelon Lips, MD [facility] pgr [phone] 12/24/2018, 11:23 AM

## 2018-12-24 NOTE — Progress Notes (Signed)
Have given report to HD and includes elevated BP. Manual BP 180/60. Hoping for lower BP after HD. Have given Norvasc around 1030. Transport is taking him for hemodialysis. Will continue to monitor.

## 2018-12-24 NOTE — Progress Notes (Signed)
Patient ID: James [name], male   DOB: 07/19/1976, 43 y.o.   MRN: [id number]  PROGRESS NOTE    James [name]  KGM:[id number] DOB: 18-Jan-1976 DOA: 12/19/2018 PCP: [place], Betty G, MD   Brief Narrative:  43 year old male with history of hypertension, diabetes mellitus type 2, bipolar disorder presented on 12/17/2018 to [facility] after being found down for 2 days following binge alcohol drinking.  He was found to have AKI, metabolic acidosis, leukocytosis and LFT elevation with rhabdomyolysis, CK of 58,000.  He was treated with aggressive IV fluids but unfortunately his renal function got worse with decreased urine output and CK rose to 130,000.  No improvement with IV diuresis.  He was transferred to [facility] on 12/19/2018.  Nephrology was consulted.  Tunneled HD catheter was inserted and hemodialysis was started on 12/20/2018.  Assessment & Plan:   Principal Problem:   Acute renal failure due to traumatic rhabdomyolysis [facility]) Active Problems:   Diabetes mellitus ([place])   Bipolar affective disorder ([place])   Alcoholism ([place])  Acute renal failure due to rhabdomyolysis -Renal ultrasound was unremarkable -Nephrology following.  Tunneled HD catheter was inserted and hemodialysis was started on 12/20/2018.  Follow nephrology recommendations. Plan for HD again today as per nephrology. -CK total improving.  Hypertension-blood pressure extremely elevated.  Will start amlodipine.  Continue IV hydralazine as needed.  Alcohol abuse -Monitor for withdrawal.  Currently no signs of withdrawal.  Patient was counseled regarding alcohol cessation  Elevated LFTs -From rhabdomyolysis and alcohol abuse.  Hepatitis panel negative, HIV nonreactive -Improving.  Monitor.  Right upper quadrant ultrasound demonstrates early cirrhotic changes  Diabetes mellitus type 2 -Continue CBGs with exercise.  Home oral meds on hold  Elevated troponin -Due to muscle breakdown and impaired  clearance.  No further interventions planned  Maculopapular rash -Improved.  Obesity -Outpatient follow-up  Bipolar disorder/anxiety -continue current regimen.  Outpatient follow-up with psychiatry   DVT prophylaxis: Heparin Code Status: Full Family Communication: None at bedside Disposition Plan: Depends on clinical outcome Consultants: Nephrology  Procedures: Tunneled HD catheter insertion on 12/20/2018  Antimicrobials: None   Subjective: Patient seen and examined at bedside.  No overnight fever, nausea or vomiting.  No worsening abdominal pain.  Still complains of swelling all over.  Objective: Vitals:   12/24/18 0300 12/24/18 0500 12/24/18 0811 12/24/18 0858  BP: (!) 184/104  (!) 202/92 (!) 188/64  Pulse: 72  83   Resp:   (!) 23   Temp: 98.7 F (37.1 C)  98 F (36.7 C)   TempSrc: Oral     SpO2: 100%  96%   Weight:  127.7 kg    Height:        Intake/Output Summary (Last 24 hours) at 12/24/2018 0931 Last data filed at 12/24/2018 0900 Gross per 24 hour  Intake 120 ml  Output -  Net 120 ml   Filed Weights   12/22/18 1457 12/23/18 0500 12/24/18 0500  Weight: 124.2 kg 124.6 kg 127.7 kg    Examination:  General exam: Appears calm and comfortable.  No distress.  Poor historian Respiratory system: Bilateral decreased breath sounds at bases, some crackles Cardiovascular system: S1 & S2 heard, Rate controlled Gastrointestinal system: Abdomen is nondistended, soft and nontender. Normal bowel sounds heard. Extremities: No cyanosis; lower extremity 1-2+ edema present   Data Reviewed: I have personally reviewed following labs and imaging studies  CBC: Recent Labs  Lab 12/19/18 1657 12/20/18 6440 12/21/18 0229 12/22/18 0457 12/23/18 3474 12/24/18 2595  WBC 11.6* 7.4 7.7 7.0 7.0 6.8  NEUTROABS 10.0*  --   --   --   --   --   HGB 12.7* 9.8* 10.0* 10.2* 9.4* 9.8*  HCT 35.7* 26.2* 29.1* 29.6* 27.8* 28.7*  MCV 89.0 85.3 87.7 92.2 90.0 92.3  PLT 183 168 193  121* 208 132   Basic Metabolic Panel: Recent Labs  Lab 12/19/18 1657 12/20/18 0551 12/21/18 0229 12/22/18 0457 12/23/18 0213 12/24/18 0418  NA 134* 133* 131* 131* 132* 129*  K 4.1 3.2* 3.9 5.4* 4.0 4.0  CL 108 92* 104 100 97* 92*  CO2 15* 33* 20* 20* 24 23  GLUCOSE 106* 79 85 86 87 82  BUN 43* 42* 40* 34* 28* 38*  CREATININE 4.45* 4.46* 5.54* 5.54* 5.02* 6.96*  CALCIUM 7.7* 6.4* 7.6* 7.9* 8.1* 8.5*  MG 1.7  --   --   --  1.7 1.8  PHOS 4.6  --   --   --   --   --    GFR: Estimated Creatinine Clearance: 19.3 mL/min (A) (by C-G formula based on SCr of 6.96 mg/dL (H)). Liver Function Tests: Recent Labs  Lab 12/20/18 0551 12/21/18 0229 12/22/18 0457 12/23/18 0213 12/24/18 0418  AST 545* 531* 382* 299* 196*  ALT 141* 146* 131* 116* 96*  ALKPHOS 31* 43 57 54 48  BILITOT 0.6 1.0 2.0* 0.4 0.4  PROT 3.6* 4.4* 4.3* 4.7* 4.6*  ALBUMIN 1.9* 2.1* 2.1* 2.1* 2.0*   No results for input(s): LIPASE, AMYLASE in the last 168 hours. No results for input(s): AMMONIA in the last 168 hours. Coagulation Profile: Recent Labs  Lab 12/20/18 0728  INR 1.1   Cardiac Enzymes: Recent Labs  Lab 12/19/18 1657 12/20/18 0551 12/21/18 0229 12/24/18 0418  CKTOTAL >50,000* 24,528* 28,554* 8,634*   BNP (last 3 results) No results for input(s): PROBNP in the last 8760 hours. HbA1C: No results for input(s): HGBA1C in the last 72 hours. CBG: Recent Labs  Lab 12/23/18 0836 12/23/18 1203 12/23/18 1707 12/23/18 2143 12/24/18 0801  GLUCAP 89 115* 107* 98 94   Lipid Profile: No results for input(s): CHOL, HDL, LDLCALC, TRIG, CHOLHDL, LDLDIRECT in the last 72 hours. Thyroid Function Tests: No results for input(s): TSH, T4TOTAL, FREET4, T3FREE, THYROIDAB in the last 72 hours. Anemia Panel: No results for input(s): VITAMINB12, FOLATE, FERRITIN, TIBC, IRON, RETICCTPCT in the last 72 hours. Sepsis Labs: No results for input(s): PROCALCITON, LATICACIDVEN in the last 168 hours.  No results  found for this or any previous visit (from the past 240 hour(s)).       Radiology Studies: No results found.      Scheduled Meds: . chlordiazePOXIDE  15 mg Oral TID  . Chlorhexidine Gluconate Cloth  6 each Topical Q0600  . FLUoxetine  40 mg Oral Daily  . folic acid  1 mg Oral Daily  . heparin  5,000 Units Subcutaneous Q8H  . insulin aspart  0-5 Units Subcutaneous QHS  . insulin aspart  0-9 Units Subcutaneous TID WC  . loxapine  25 mg Oral Daily  . loxapine  50 mg Oral QHS  . risperiDONE  9 mg Oral QHS  . thiamine  100 mg Oral Daily  . topiramate  250 mg Oral QHS  . traZODone  300 mg Oral QHS   Continuous Infusions: . sodium chloride    . sodium chloride       LOS: 5 days        Aline August, MD [facility] 12/24/2018, 9:31 AM

## 2018-12-24 NOTE — Progress Notes (Signed)
Paged On call regarding pt's elevated BP, awaiting feedback, pt still edematous, administered pain pill due to generalized pain

## 2018-12-24 NOTE — Progress Notes (Signed)
Manual BP 188/64. C/O throbbing frontal headache 3/10. PRN hydralazine was given at [id number] for previous elevated BP so no PRN available. Have paged MD. Will continue to monitor.   12/24/2018 @ 1015 Orders were received. Have administered ordered Norvasc and decreased amount of Librium. Will continue to monitor.

## 2018-12-24 NOTE — Progress Notes (Signed)
Occupational Therapy Evaluation Patient Details Name: James [name] MRN: [id number] DOB: Jul 02, 1976 Today's Date: 12/24/2018    History of Present Illness Pt is a 43 y/o male admitted after fall. Found to have acute renal failure secondary to rhabdomyolysis. Pt is s/p tunneled HD catheter and started HD on 4/7. PMH includes DM, bipolar disorder, HTN, and alcohol abuse.    Clinical Impression   PTA, pt was living at home alone, and was independent with ADL/IADL and functional mobility. Pt currently limited to bed level ADL secondary to reports of increased pain and swelling. Pt required setupA for bed level UB ADL. Pt educated on importance of elevating BUE and movement of legs/arms/and body to maintain skin integrity and assist with managing swelling. Pt required modA for rolling in bed.  Due to decline in current level of function, pt would benefit from acute OT to address established goals to facilitate safe D/C to venue listed below. At this time, recommend HHOT follow-up. However, if pt does not progress, he may require short term SNF. Will continue to follow acutely to maximize safety and independence with ADL and functional mobility.     Follow Up Recommendations  Home health OT;Supervision - Intermittent    Equipment Recommendations       Recommendations for Other Services PT consult     Precautions / Restrictions Precautions Precautions: Fall Restrictions Weight Bearing Restrictions: No      Mobility Bed Mobility Overal bed mobility: Needs Assistance Bed Mobility: Rolling Rolling: Mod assist         General bed mobility comments: pt agreeable to rolling, modA to progress to side  Transfers Overall transfer level: Needs assistance               General transfer comment: defered OOB mobility secondary to pt request limited by pain and stiffness    Balance                                           ADL either performed or assessed  with clinical judgement   ADL Overall ADL's : Needs assistance/impaired Eating/Feeding: Set up;Sitting   Grooming: Set up;Sitting   Upper Body Bathing: Set up;Sitting   Lower Body Bathing: Moderate assistance   Upper Body Dressing : Moderate assistance;Sitting   Lower Body Dressing: Moderate assistance                 General ADL Comments: pt limited secondary to reports of increased pain and swelling limiting his participation and movement during session, pt requested to defer OOB mobility to later date;     Vision Patient Visual Report: No change from baseline Vision Assessment?: (noted deficit in L eye, to be further assessed)     Perception     Praxis      Pertinent Vitals/Pain Pain Assessment: Faces Faces Pain Scale: Hurts even more Pain Location: BLE, BUE Pain Descriptors / Indicators: Aching;Guarding Pain Intervention(s): Limited activity within patient's tolerance     Hand Dominance Right   Extremity/Trunk Assessment Upper Extremity Assessment Upper Extremity Assessment: RUE deficits/detail;LUE deficits/detail RUE Deficits / Details: noted wounds on elbow;limited shoulder flexion ROM secondary to pain, PROM about 100 degrees, AROM about 80, reports increased swelling, MMT grossly 4/5 RUE: Unable to fully assess due to pain RUE Sensation: WNL RUE Coordination: decreased fine motor;decreased gross motor LUE Deficits / Details: noted wounds on dorsal wrist, medial/dorsal  forearm, and elbow;limited shoulder flexion ROM secondary to pain, PROM about 100 degrees, AROM about 80, reports increased swelling, MMT grossly 4+/5 LUE: Unable to fully assess due to pain LUE Sensation: WNL LUE Coordination: decreased fine motor;decreased gross motor   Lower Extremity Assessment Lower Extremity Assessment: Defer to PT evaluation RLE Deficits / Details: noted wound on B knees;pt reports stiffness in BLE limiting ROM  LLE Deficits / Details: noted wounds on knees,  limited ROM secondary to pt reports of stiffness       Communication Communication Communication: No difficulties   Cognition Arousal/Alertness: Awake/alert Behavior During Therapy: WFL for tasks assessed/performed Overall Cognitive Status: Within Functional Limits for tasks assessed                                     General Comments  educated pt on elevating extremeties and continueing to move to maintain strength and mobilty;per nsg, pt to go to HD later this date    Exercises     Shoulder Instructions      Home Living Family/patient expects to be discharged to:: Private residence Living Arrangements: Alone Available Help at Discharge: Other (Comment)(reports he does not have anyone to check in ) Type of Home: House Home Access: Level entry     Home Layout: Two level Alternate Level Stairs-Number of Steps: flight Alternate Level Stairs-Rails: Left Bathroom Shower/Tub: Teacher, early years/pre: Standard     Home Equipment: None          Prior Functioning/Environment Level of Independence: Independent                 OT Problem List: Decreased strength;Decreased range of motion;Decreased activity tolerance;Impaired balance (sitting and/or standing);Decreased safety awareness;Decreased knowledge of use of DME or AE;Impaired UE functional use;Pain;Increased edema      OT Treatment/Interventions: Self-care/ADL training;Therapeutic exercise;Energy conservation;DME and/or AE instruction;Modalities;Manual therapy;Therapeutic activities;Patient/family education;Balance training    OT Goals(Current goals can be found in the care plan section) Acute Rehab OT Goals Patient Stated Goal: to get better OT Goal Formulation: With patient Time For Goal Achievement: 01/07/19 Potential to Achieve Goals: Good ADL Goals Pt Will Perform Grooming: Independently Pt Will Perform Upper Body Dressing: Independently Pt Will Perform Lower Body Dressing:  Independently Pt Will Transfer to Toilet: Independently;ambulating  OT Frequency: Min 3X/week   Barriers to D/C: Decreased caregiver support  pt lives alone       Co-evaluation              AM-PAC OT "6 Clicks" Daily Activity     Outcome Measure Help from another person eating meals?: None Help from another person taking care of personal grooming?: A Little Help from another person toileting, which includes using toliet, bedpan, or urinal?: Total Help from another person bathing (including washing, rinsing, drying)?: A Little Help from another person to put on and taking off regular upper body clothing?: A Little Help from another person to put on and taking off regular lower body clothing?: Total 6 Click Score: 15   End of Session Nurse Communication: Mobility status  Activity Tolerance: Patient limited by pain Patient left: in bed;with call bell/phone within reach  OT Visit Diagnosis: Unsteadiness on feet (R26.81);Other abnormalities of gait and mobility (R26.89);Muscle weakness (generalized) (M62.81);Pain Pain - part of body: Shoulder;Arm;Knee;Leg;Hand;Hip                Time: 3833-3832 OT Time Calculation (  min): 11 min Charges:  OT General Charges $OT Visit: 1 Visit OT Evaluation $OT Eval Moderate Complexity: Lavina OTR/L Acute Rehabilitation Services Office: [place] 12/24/2018, 1:04 PM

## 2018-12-25 ENCOUNTER — Inpatient Hospital Stay (HOSPITAL_COMMUNITY): Payer: Medicare HMO

## 2018-12-25 DIAGNOSIS — R509 Fever, unspecified: Secondary | ICD-10-CM

## 2018-12-25 LAB — URINALYSIS, ROUTINE W REFLEX MICROSCOPIC
Bilirubin Urine: NEGATIVE
Glucose, UA: NEGATIVE mg/dL
Ketones, ur: NEGATIVE mg/dL
Nitrite: NEGATIVE
Protein, ur: 100 mg/dL — AB
RBC / HPF: >50 RBC/hpf — ABNORMAL HIGH (ref 0–5)
Specific Gravity, Urine: 1.013 (ref 1.005–1.030)
WBC, UA: >50 WBC/hpf — ABNORMAL HIGH (ref 0–5)
pH: 7 (ref 5.0–8.0)

## 2018-12-25 LAB — GLUCOSE, CAPILLARY
Glucose-Capillary: 86 mg/dL (ref 70–99)
Glucose-Capillary: 95 mg/dL (ref 70–99)
Glucose-Capillary: 98 mg/dL (ref 70–99)

## 2018-12-25 MED ORDER — FUROSEMIDE 10 MG/ML IJ SOLN
100.0000 mg | Freq: Once | INTRAVENOUS | Status: AC
  Administered 2018-12-25: 100 mg via INTRAVENOUS
  Filled 2018-12-25: qty 10

## 2018-12-25 MED ORDER — METOPROLOL TARTRATE 25 MG PO TABS
25.0000 mg | ORAL_TABLET | Freq: Two times a day (BID) | ORAL | Status: DC
  Administered 2018-12-25 – 2019-01-04 (×19): 25 mg via ORAL
  Filled 2018-12-25 (×21): qty 1

## 2018-12-25 MED ORDER — POLYETHYLENE GLYCOL 3350 17 G PO PACK
17.0000 g | PACK | Freq: Every day | ORAL | Status: DC
  Administered 2018-12-25 – 2019-01-15 (×14): 17 g via ORAL
  Filled 2018-12-25 (×19): qty 1

## 2018-12-25 MED ORDER — CHLORDIAZEPOXIDE HCL 5 MG PO CAPS
10.0000 mg | ORAL_CAPSULE | Freq: Two times a day (BID) | ORAL | Status: DC
  Administered 2018-12-25 (×2): 10 mg via ORAL
  Filled 2018-12-25 (×2): qty 2

## 2018-12-25 MED ORDER — ACETAMINOPHEN 500 MG PO TABS
500.0000 mg | ORAL_TABLET | Freq: Four times a day (QID) | ORAL | Status: DC | PRN
  Administered 2018-12-25 – 2018-12-27 (×2): 500 mg via ORAL
  Filled 2018-12-25 (×2): qty 1

## 2018-12-25 NOTE — TOC Initial Note (Signed)
Transition of Care [facility]) - Initial/Assessment Note    Patient Details  Name: James [name] MRN: [id number] Date of Birth: 07-Jun-1976  Transition of Care [facility]) CM/SW Contact:    Carles Collet, RN Phone Number: 12/25/2018, 3:40 PM  Clinical Narrative:                 Spoke to patient. Discussed plan for DC. He states that he lives at home alone. He fell at home and his mom found him. He states that his mom helps him, checks on him, and provides transportation for him. He admits that ETOH is a problem for him. He would like resources for ETOH and I placed a consult for CSW.  He is also interested in [facility] and RW. Discussed [place] agencies and Medicare ratings. He would like to use [facility], referral placed, Needs orders. He is also interested in RW as rec by PT. Order placed needs to be called in and delivered to room prior to DC. CM/CSW will continue to follow for DC planning.    Expected Discharge Plan: [place] Barriers to Discharge: Continued Medical Work up   Patient Goals and CMS Choice Patient states their goals for this hospitalization and ongoing recovery are:: to return home CMS Medicare.gov Compare Post Acute Care list provided to:: Patient Choice offered to / list presented to : Patient  Expected Discharge Plan and Services Expected Discharge Plan: [place] In-house Referral: Clinical Social Work Discharge Planning Services: CM Consult Post Acute Care Choice: Home Health, Durable Medical Equipment Living arrangements for the past 2 months: Apartment                 DME Arranged: Walker rolling   HH Arranged: PT, OT HH Agency: [place] ([facility])  Prior Living Arrangements/Services Living arrangements for the past 2 months: Apartment Lives with:: Self Patient language and need for interpreter reviewed:: Yes Do you feel safe going back to the place where you live?: Yes            Criminal Activity/Legal Involvement  Pertinent to Current Situation/Hospitalization: No - Comment as needed  Activities of Daily Living Home Assistive Devices/Equipment: None ADL Screening (condition at time of admission) Patient's cognitive ability adequate to safely complete daily activities?: Yes Is the patient deaf or have difficulty hearing?: No Does the patient have difficulty seeing, even when wearing glasses/contacts?: No Does the patient have difficulty concentrating, remembering, or making decisions?: No Patient able to express need for assistance with ADLs?: Yes Does the patient have difficulty dressing or bathing?: Yes Independently performs ADLs?: Yes (appropriate for developmental age) Does the patient have difficulty walking or climbing stairs?: Yes Weakness of Legs: Both Weakness of Arms/Hands: Both  Permission Sought/Granted                  Emotional Assessment              Admission diagnosis:  RHABDOMYOLYSIS RENAL FAILURE Patient Active Problem List   Diagnosis Date Noted  . Acute renal failure due to traumatic rhabdomyolysis ([place]) 12/19/2018  . Alcoholism ([place]) 12/19/2018  . Pancreatitis 05/11/2014  . Acute pancreatitis 05/11/2014  . Edema of right lower extremity 02/22/2014  . [place]'s infraction 02/21/2014  . Cellulitis and abscess of foot 02/18/2014  . Diabetic ulcer of right foot ([place]) 02/18/2014  . Cellulitis of right lower leg 02/04/2014  . Sepsis ([place]) 10/26/2013  . Urinary tract infection 08/24/2012  . Fever 08/24/2012  .  Diabetes mellitus ([place]) 08/24/2012  . Cellulitis 08/24/2012  . Bipolar affective disorder ([place]) 08/24/2012   PCP:  Martinique, Betty G, MD Pharmacy:   [facility] [place], [address] [place] [address] Phone: [phone] Fax: [phone]     Social Determinants of Health (SDOH) Interventions    Readmission Risk Interventions No flowsheet data  found.

## 2018-12-25 NOTE — Progress Notes (Signed)
Patient ID: James [name], male   DOB: May 09, 1976, 43 y.o.   MRN: [id number]  PROGRESS NOTE    James [name]  STM:[id number] DOB: [birth year] DOA: 12/19/2018 PCP: Martinique, Betty G, MD   Brief Narrative:  43 year old male with history of hypertension, diabetes mellitus type 2, bipolar disorder presented on 12/17/2018 to [facility] after being found down for 2 days following binge alcohol drinking.  He was found to have AKI, metabolic acidosis, leukocytosis and LFT elevation with rhabdomyolysis, CK of 58,000.  He was treated with aggressive IV fluids but unfortunately his renal function got worse with decreased urine output and CK rose to 130,000.  No improvement with IV diuresis.  He was transferred to [facility] on 12/19/2018.  Nephrology was consulted.  Tunneled HD catheter was inserted and hemodialysis was started on 12/20/2018.  Assessment & Plan:   Principal Problem:   Acute renal failure due to traumatic rhabdomyolysis ([place]) Active Problems:   Diabetes mellitus ([place])   Bipolar affective disorder (HCC)   Alcoholism (HCC)  Fever -Questionable cause.  Had T-max of 101.5 overnight.  Patient denies worsening cough, abdominal pain or diarrhea. -We will get blood and urine cultures and chest x-ray.  Monitor temperature curve.  Monitor off antibiotics for now.  Acute renal failure due to rhabdomyolysis -Renal ultrasound was unremarkable -Nephrology following.  Tunneled HD catheter was inserted and hemodialysis was started on 12/20/2018.  Follow nephrology recommendations.  Patient had dialysis again on 12/24/2018. -Follow labs for today.  Repeat a.m. labs.  Hypertension-blood pressure better.  Continue amlodipine.  IV hydralazine as needed  Alcohol abuse -Monitor for withdrawal.  Currently no signs of withdrawal.  Continue Librium tapering.  Patient was counseled regarding alcohol cessation  Elevated LFTs -From rhabdomyolysis and alcohol abuse.  Hepatitis panel  negative, HIV nonreactive -Improving.  Monitor.  Right upper quadrant ultrasound demonstrates early cirrhotic changes  Diabetes mellitus type 2 -Continue CBGs with exercise.  Home oral meds on hold  Elevated troponin -Due to muscle breakdown and impaired clearance.  No further interventions planned  Maculopapular rash -Improved.  Obesity -Outpatient follow-up  Bipolar disorder/anxiety -continue current regimen.  Outpatient follow-up with psychiatry   DVT prophylaxis: Heparin Code Status: Full Family Communication: None at bedside Disposition Plan: Depends on clinical outcome Consultants: Nephrology  Procedures: Tunneled HD catheter insertion on 12/20/2018  Antimicrobials: None   Subjective: Patient seen and examined at bedside.  Patient had fevers overnight.  He denies any worsening shortness of breath, abdominal pain, diarrhea.  Objective: Vitals:   12/24/18 2351 12/25/18 0240 12/25/18 0500 12/25/18 0517  BP:    (!) 159/85  Pulse:    98  Resp:    18  Temp: (!) 101 F (38.3 C) 98.6 F (37 C)  99.6 F (37.6 C)  TempSrc: Oral Oral  Oral  SpO2:    97%  Weight:   125.3 kg   Height:        Intake/Output Summary (Last 24 hours) at 12/25/2018 0931 Last data filed at 12/24/2018 1859 Gross per 24 hour  Intake 0 ml  Output 3200 ml  Net -3200 ml   Filed Weights   12/24/18 1304 12/24/18 1651 12/25/18 0500  Weight: 127.4 kg 124.3 kg 125.3 kg    Examination:  General exam: Appears calm and comfortable.  No acute distress.  Poor historian Respiratory system: Bilateral decreased breath sounds at bases, some crackles.  No wheezing Cardiovascular system: Rate controlled, S1-S2 heard Gastrointestinal system: Abdomen is nondistended, soft and  nontender. Normal bowel sounds heard. Extremities: No cyanosis; lower extremity 1-2+ edema present   Data Reviewed: I have personally reviewed following labs and imaging studies  CBC: Recent Labs  Lab 12/19/18 1657 12/20/18  0607 12/21/18 0229 12/22/18 0457 12/23/18 0213 12/24/18 0418  WBC 11.6* 7.4 7.7 7.0 7.0 6.8  NEUTROABS 10.0*  --   --   --   --   --   HGB 12.7* 9.8* 10.0* 10.2* 9.4* 9.8*  HCT 35.7* 26.2* 29.1* 29.6* 27.8* 28.7*  MCV 89.0 85.3 87.7 92.2 90.0 92.3  PLT 183 168 193 121* 208 659   Basic Metabolic Panel: Recent Labs  Lab 12/19/18 1657 12/20/18 0551 12/21/18 0229 12/22/18 0457 12/23/18 0213 12/24/18 0418  NA 134* 133* 131* 131* 132* 129*  K 4.1 3.2* 3.9 5.4* 4.0 4.0  CL 108 92* 104 100 97* 92*  CO2 15* 33* 20* 20* 24 23  GLUCOSE 106* 79 85 86 87 82  BUN 43* 42* 40* 34* 28* 38*  CREATININE 4.45* 4.46* 5.54* 5.54* 5.02* 6.96*  CALCIUM 7.7* 6.4* 7.6* 7.9* 8.1* 8.5*  MG 1.7  --   --   --  1.7 1.8  PHOS 4.6  --   --   --   --   --    GFR: Estimated Creatinine Clearance: 19.1 mL/min (A) (by C-G formula based on SCr of 6.96 mg/dL (H)). Liver Function Tests: Recent Labs  Lab 12/20/18 0551 12/21/18 0229 12/22/18 0457 12/23/18 0213 12/24/18 0418  AST 545* 531* 382* 299* 196*  ALT 141* 146* 131* 116* 96*  ALKPHOS 31* 43 57 54 48  BILITOT 0.6 1.0 2.0* 0.4 0.4  PROT 3.6* 4.4* 4.3* 4.7* 4.6*  ALBUMIN 1.9* 2.1* 2.1* 2.1* 2.0*   No results for input(s): LIPASE, AMYLASE in the last 168 hours. No results for input(s): AMMONIA in the last 168 hours. Coagulation Profile: Recent Labs  Lab 12/20/18 0728  INR 1.1   Cardiac Enzymes: Recent Labs  Lab 12/19/18 1657 12/20/18 0551 12/21/18 0229 12/24/18 0418  CKTOTAL >50,000* 24,528* 28,554* 8,634*   BNP (last 3 results) No results for input(s): PROBNP in the last 8760 hours. HbA1C: No results for input(s): HGBA1C in the last 72 hours. CBG: Recent Labs  Lab 12/23/18 2143 12/24/18 0801 12/24/18 1217 12/24/18 1754 12/24/18 2158  GLUCAP 98 94 91 115* 141*   Lipid Profile: No results for input(s): CHOL, HDL, LDLCALC, TRIG, CHOLHDL, LDLDIRECT in the last 72 hours. Thyroid Function Tests: No results for input(s): TSH,  T4TOTAL, FREET4, T3FREE, THYROIDAB in the last 72 hours. Anemia Panel: No results for input(s): VITAMINB12, FOLATE, FERRITIN, TIBC, IRON, RETICCTPCT in the last 72 hours. Sepsis Labs: No results for input(s): PROCALCITON, LATICACIDVEN in the last 168 hours.  No results found for this or any previous visit (from the past 240 hour(s)).       Radiology Studies: Dg Chest Port 1 View  Result Date: 12/25/2018 CLINICAL DATA:  Dyspnea EXAM: PORTABLE CHEST 1 VIEW COMPARISON:  12/17/2018 chest radiograph. FINDINGS: Right internal jugular central venous catheter terminates in the lower third of the SVC. Stable cardiomediastinal silhouette with normal heart size. No pneumothorax. Slight blunting of the right costophrenic angle. No left pleural effusion. No overt pulmonary edema. No acute consolidative airspace disease. IMPRESSION: 1. No overt pulmonary edema. 2. Slight blunting of the right costophrenic angle suggesting small right pleural effusion. Electronically Signed   By: Ilona Sorrel M.D.   On: 12/25/2018 08:38  Scheduled Meds: . amLODipine  10 mg Oral Daily  . chlordiazePOXIDE  10 mg Oral BID  . Chlorhexidine Gluconate Cloth  6 each Topical Q0600  . FLUoxetine  40 mg Oral Daily  . folic acid  1 mg Oral Daily  . heparin  5,000 Units Subcutaneous Q8H  . insulin aspart  0-5 Units Subcutaneous QHS  . insulin aspart  0-9 Units Subcutaneous TID WC  . loxapine  25 mg Oral Daily  . loxapine  50 mg Oral QHS  . risperiDONE  9 mg Oral QHS  . thiamine  100 mg Oral Daily  . topiramate  250 mg Oral QHS  . traZODone  300 mg Oral QHS   Continuous Infusions: . sodium chloride    . sodium chloride       LOS: 6 days        Aline August, MD [facility] 12/25/2018, 9:31 AM

## 2018-12-25 NOTE — Plan of Care (Signed)
  Problem: Education: Goal: Knowledge of General Education information will improve Description Including pain rating scale, medication(s)/side effects and non-pharmacologic comfort measures Outcome: Progressing   Problem: Health Behavior/Discharge Planning: Goal: Ability to manage health-related needs will improve Outcome: Progressing   Problem: Clinical Measurements: Goal: Ability to maintain clinical measurements within normal limits will improve Outcome: Not Progressing  Pt has been spiking a fever on & off since last night. Labs have been draw including urine & blood cx.  Goal: Will remain free from infection Outcome: Not Progressing Pt has been spiking a fever on & off since last night. Labs have been draw including urine & blood cx.  Goal: Diagnostic test results will improve Outcome: Not Progressing Goal: Respiratory complications will improve Outcome: Progressing Goal: Cardiovascular complication will be avoided Outcome: Progressing   Problem: Nutrition: Goal: Adequate nutrition will be maintained Outcome: Not Progressing Pt has not eaten anything all day. Only drinking.    Problem: Elimination: Goal: Will not experience complications related to bowel motility Outcome: Not Progressing Pt has not had a BM since last Sunday 4/5. Miralax added daily Goal: Will not experience complications related to urinary retention Outcome: Not Progressing   Problem: Pain Managment: Goal: General experience of comfort will improve Outcome: Progressing

## 2018-12-25 NOTE — Progress Notes (Signed)
[name] Progress Note    Assessment/ Plan:   1.  Acute oliguric kidney injury: rhabdo and pigment nephropathy in the setting of lisinopril, Farxiga.  Aggressive IVFs d/c'd since on HD now.  CKs still elevated but downtrending.  HD #1 4/7, HD #2 4/8, HD #3 4/9. Next planned HD 4/13 with healthy UF goal.  Some increasing UOP, follow for recovery.  Lasix challenge today.  Have discussed fluid restriction.    2.  DM II: per primary, was on metformin and Farxiga as OP.  3.  Bipolar/anxiety: per primary  4.  EtOH abuse: RUQ [place] with early cirrhosis, likely EtOH,  Hep panel ordered at [facility], Hep B negative, CIWA protocol  5.  Elevated troponin- likely d/t rhabdo per cardiology at OSH, no further intervention   6.  Acute on chronic combined systolic and diastolic CHF: EF 67-34%.  Lasix and vol removal as above  7.  Maculopapular rash: folliculitis vs drug rash- monitor, supportive care.  8.  HTN: amlodipine added 4/11, agree    9.  Fever: monitoring off antibiotics, getting CXR and cultures  10.  Dispo: inpatient, pending renal function  Subjective:    HD yesterday with 3L removed.  Not adhering to fluid restriction- several pitchers and water glasses at bedside.  Febrile 101.5.  No associates symptoms.  For HD tomorrow.     Objective:   BP (!) 159/85 (BP Location: Right Arm) Comment: will notify the nurse  Pulse 98   Temp 99.6 F (37.6 C) (Oral)   Resp 18   Ht 6\' 3"  (1.905 m)   Wt 125.3 kg   SpO2 97%   BMI 34.53 kg/m   Intake/Output Summary (Last 24 hours) at 12/25/2018 0949 Last data filed at 12/24/2018 1859 Gross per 24 hour  Intake 0 ml  Output 3200 ml  Net -3200 ml   Weight change: -0.3 kg  Physical Exam: GEN: NAD, sitting in bed HEENT EOMI PERRL NECK + JVD, improved PULM clear bilaterally CV RRR no m/r/g ABD distended, + abd wall edema EXT 2+ nonpitting edema all 4 extremities NEURO AAO x 3 nonfocal MSK + tenderness but no tight  compartments on exam in arms or legs SKIN: maculopapular rash on torso and neck, bruising on forehead and bilateral knees, R temple with maculopapular rash too  L dorsal wrist with fluid-filled bulla and some surrounding erythema.  ACCESS: R IJ nontunneled HD catheter  Imaging: Dg Chest Port 1 View  Result Date: 12/25/2018 CLINICAL DATA:  Dyspnea EXAM: PORTABLE CHEST 1 VIEW COMPARISON:  12/17/2018 chest radiograph. FINDINGS: Right internal jugular central venous catheter terminates in the lower third of the SVC. Stable cardiomediastinal silhouette with normal heart size. No pneumothorax. Slight blunting of the right costophrenic angle. No left pleural effusion. No overt pulmonary edema. No acute consolidative airspace disease. IMPRESSION: 1. No overt pulmonary edema. 2. Slight blunting of the right costophrenic angle suggesting small right pleural effusion. Electronically Signed   By: Ilona Sorrel M.D.   On: 12/25/2018 08:38    Labs: BMET Recent Labs  Lab 12/19/18 1657 12/20/18 0551 12/21/18 0229 12/22/18 0457 12/23/18 0213 12/24/18 0418  NA 134* 133* 131* 131* 132* 129*  K 4.1 3.2* 3.9 5.4* 4.0 4.0  CL 108 92* 104 100 97* 92*  CO2 15* 33* 20* 20* 24 23  GLUCOSE 106* 79 85 86 87 82  BUN 43* 42* 40* 34* 28* 38*  CREATININE 4.45* 4.46* 5.54* 5.54* 5.02* 6.96*  CALCIUM 7.7* 6.4* 7.6*  7.9* 8.1* 8.5*  PHOS 4.6  --   --   --   --   --    CBC Recent Labs  Lab 12/19/18 1657  12/21/18 0229 12/22/18 0457 12/23/18 0213 12/24/18 0418  WBC 11.6*   < > 7.7 7.0 7.0 6.8  NEUTROABS 10.0*  --   --   --   --   --   HGB 12.7*   < > 10.0* 10.2* 9.4* 9.8*  HCT 35.7*   < > 29.1* 29.6* 27.8* 28.7*  MCV 89.0   < > 87.7 92.2 90.0 92.3  PLT 183   < > 193 121* 208 239   < > = values in this interval not displayed.    Medications:    . amLODipine  10 mg Oral Daily  . chlordiazePOXIDE  10 mg Oral BID  . Chlorhexidine Gluconate Cloth  6 each Topical Q0600  . FLUoxetine  40 mg Oral Daily  .  folic acid  1 mg Oral Daily  . heparin  5,000 Units Subcutaneous Q8H  . insulin aspart  0-5 Units Subcutaneous QHS  . insulin aspart  0-9 Units Subcutaneous TID WC  . loxapine  25 mg Oral Daily  . loxapine  50 mg Oral QHS  . risperiDONE  9 mg Oral QHS  . thiamine  100 mg Oral Daily  . topiramate  250 mg Oral QHS  . traZODone  300 mg Oral QHS      Madelon Lips, MD [facility] pgr [phone] 12/25/2018, 9:49 AM

## 2018-12-26 ENCOUNTER — Inpatient Hospital Stay (HOSPITAL_COMMUNITY): Payer: Medicare HMO

## 2018-12-26 DIAGNOSIS — Z95828 Presence of other vascular implants and grafts: Secondary | ICD-10-CM

## 2018-12-26 DIAGNOSIS — I10 Essential (primary) hypertension: Secondary | ICD-10-CM

## 2018-12-26 DIAGNOSIS — T796XXA Traumatic ischemia of muscle, initial encounter: Secondary | ICD-10-CM

## 2018-12-26 DIAGNOSIS — T827XXA Infection and inflammatory reaction due to other cardiac and vascular devices, implants and grafts, initial encounter: Secondary | ICD-10-CM

## 2018-12-26 DIAGNOSIS — Z87891 Personal history of nicotine dependence: Secondary | ICD-10-CM

## 2018-12-26 DIAGNOSIS — F1020 Alcohol dependence, uncomplicated: Secondary | ICD-10-CM

## 2018-12-26 DIAGNOSIS — F319 Bipolar disorder, unspecified: Secondary | ICD-10-CM

## 2018-12-26 DIAGNOSIS — Z992 Dependence on renal dialysis: Secondary | ICD-10-CM

## 2018-12-26 DIAGNOSIS — R7881 Bacteremia: Secondary | ICD-10-CM

## 2018-12-26 DIAGNOSIS — E119 Type 2 diabetes mellitus without complications: Secondary | ICD-10-CM

## 2018-12-26 DIAGNOSIS — W06XXXA Fall from bed, initial encounter: Secondary | ICD-10-CM

## 2018-12-26 DIAGNOSIS — N179 Acute kidney failure, unspecified: Secondary | ICD-10-CM

## 2018-12-26 DIAGNOSIS — B9561 Methicillin susceptible Staphylococcus aureus infection as the cause of diseases classified elsewhere: Secondary | ICD-10-CM

## 2018-12-26 LAB — CBC WITH DIFFERENTIAL/PLATELET
Abs Immature Granulocytes: 0.05 10*3/uL (ref 0.00–0.07)
Basophils Absolute: 0 10*3/uL (ref 0.0–0.1)
Basophils Relative: 0 %
Eosinophils Absolute: 0 10*3/uL (ref 0.0–0.5)
Eosinophils Relative: 0 %
HCT: 26.6 % — ABNORMAL LOW (ref 39.0–52.0)
Hemoglobin: 9 g/dL — ABNORMAL LOW (ref 13.0–17.0)
Immature Granulocytes: 1 %
Lymphocytes Relative: 5 %
Lymphs Abs: 0.6 10*3/uL — ABNORMAL LOW (ref 0.7–4.0)
MCH: 31.4 pg (ref 26.0–34.0)
MCHC: 33.8 g/dL (ref 30.0–36.0)
MCV: 92.7 fL (ref 80.0–100.0)
Monocytes Absolute: 0.8 10*3/uL (ref 0.1–1.0)
Monocytes Relative: 7 %
Neutro Abs: 9 10*3/uL — ABNORMAL HIGH (ref 1.7–7.7)
Neutrophils Relative %: 87 %
Platelets: 253 10*3/uL (ref 150–400)
RBC: 2.87 MIL/uL — ABNORMAL LOW (ref 4.22–5.81)
RDW: 12.4 % (ref 11.5–15.5)
WBC: 10.5 10*3/uL (ref 4.0–10.5)
nRBC: 0 % (ref 0.0–0.2)

## 2018-12-26 LAB — BASIC METABOLIC PANEL
Anion gap: 13 (ref 5–15)
BUN: 46 mg/dL — ABNORMAL HIGH (ref 6–20)
CO2: 23 mmol/L (ref 22–32)
Calcium: 8.5 mg/dL — ABNORMAL LOW (ref 8.9–10.3)
Chloride: 91 mmol/L — ABNORMAL LOW (ref 98–111)
Creatinine, Ser: 7.8 mg/dL — ABNORMAL HIGH (ref 0.61–1.24)
GFR calc Af Amer: 9 mL/min — ABNORMAL LOW (ref >60)
GFR calc non Af Amer: 8 mL/min — ABNORMAL LOW (ref >60)
Glucose, Bld: 98 mg/dL (ref 70–99)
Potassium: 5.7 mmol/L — ABNORMAL HIGH (ref 3.5–5.1)
Sodium: 127 mmol/L — ABNORMAL LOW (ref 135–145)

## 2018-12-26 LAB — BLOOD CULTURE ID PANEL (REFLEXED)

## 2018-12-26 LAB — MAGNESIUM: Magnesium: 1.9 mg/dL (ref 1.7–2.4)

## 2018-12-26 LAB — ECHOCARDIOGRAM COMPLETE
Height: 75 in
Weight: 4373.93 oz

## 2018-12-26 LAB — GLUCOSE, CAPILLARY
Glucose-Capillary: 94 mg/dL (ref 70–99)
Glucose-Capillary: 81 mg/dL (ref 70–99)
Glucose-Capillary: 106 mg/dL — ABNORMAL HIGH (ref 70–99)
Glucose-Capillary: 95 mg/dL (ref 70–99)

## 2018-12-26 LAB — CK: Total CK: 3103 U/L — ABNORMAL HIGH (ref 49–397)

## 2018-12-26 MED ORDER — CEFAZOLIN SODIUM-DEXTROSE 2-4 GM/100ML-% IV SOLN
2.0000 g | Freq: Once | INTRAVENOUS | Status: AC
  Administered 2018-12-26: 2 g via INTRAVENOUS
  Filled 2018-12-26: qty 100

## 2018-12-26 MED ORDER — CHLORDIAZEPOXIDE HCL 5 MG PO CAPS
5.0000 mg | ORAL_CAPSULE | Freq: Two times a day (BID) | ORAL | Status: DC
  Administered 2018-12-26 – 2018-12-27 (×2): 5 mg via ORAL
  Filled 2018-12-26 (×2): qty 1

## 2018-12-26 MED ORDER — SODIUM CHLORIDE 0.9 % IV SOLN
INTRAVENOUS | Status: DC | PRN
  Administered 2018-12-26: 10 mL via INTRAVENOUS
  Administered 2019-01-09: 500 mL via INTRAVENOUS
  Administered 2019-01-09: 11:00:00 via INTRAVENOUS

## 2018-12-26 MED ORDER — CEFAZOLIN SODIUM-DEXTROSE 2-4 GM/100ML-% IV SOLN
2.0000 g | INTRAVENOUS | Status: AC
  Administered 2018-12-26: 2 g via INTRAVENOUS
  Filled 2018-12-26 (×2): qty 100

## 2018-12-26 MED ORDER — ENSURE ENLIVE PO LIQD
237.0000 mL | Freq: Two times a day (BID) | ORAL | Status: DC
  Administered 2018-12-26 – 2019-01-15 (×27): 237 mL via ORAL
  Filled 2018-12-26 (×2): qty 237

## 2018-12-26 NOTE — Progress Notes (Signed)
PHARMACY - PHYSICIAN COMMUNICATION CRITICAL VALUE ALERT - BLOOD CULTURE IDENTIFICATION (BCID)  James [name] is an 43 y.o. male who presented to [facility] on 12/19/2018 with a chief complaint of acute renal failure/rhabdomyolysis  Assessment:   2/2 Blood cultures growing MSSA  Name of physician (or Provider) Contacted: C Bodenheimer  Current antibiotics: None  Changes to prescribed antibiotics recommended:   Start Ancef 2 g IV after each HD, first dose now  Results for orders placed or performed during the hospital encounter of 12/19/18  Blood Culture ID Panel (Reflexed) (Collected: 12/25/2018 10:04 AM)  Result Value Ref Range   Enterococcus species NOT DETECTED NOT DETECTED   Listeria monocytogenes NOT DETECTED NOT DETECTED   Staphylococcus species DETECTED (A) NOT DETECTED   Staphylococcus aureus (BCID) DETECTED (A) NOT DETECTED   Methicillin resistance NOT DETECTED NOT DETECTED   Streptococcus species NOT DETECTED NOT DETECTED   Streptococcus agalactiae NOT DETECTED NOT DETECTED   Streptococcus pneumoniae NOT DETECTED NOT DETECTED   Streptococcus pyogenes NOT DETECTED NOT DETECTED   Acinetobacter baumannii NOT DETECTED NOT DETECTED   Enterobacteriaceae species NOT DETECTED NOT DETECTED   Enterobacter cloacae complex NOT DETECTED NOT DETECTED   Escherichia coli NOT DETECTED NOT DETECTED   Klebsiella oxytoca NOT DETECTED NOT DETECTED   Klebsiella pneumoniae NOT DETECTED NOT DETECTED   Proteus species NOT DETECTED NOT DETECTED   Serratia marcescens NOT DETECTED NOT DETECTED   Haemophilus influenzae NOT DETECTED NOT DETECTED   Neisseria meningitidis NOT DETECTED NOT DETECTED   Pseudomonas aeruginosa NOT DETECTED NOT DETECTED   Candida albicans NOT DETECTED NOT DETECTED   Candida glabrata NOT DETECTED NOT DETECTED   Candida krusei NOT DETECTED NOT DETECTED   Candida parapsilosis NOT DETECTED NOT DETECTED   Candida tropicalis NOT DETECTED NOT DETECTED    Caryl Pina 12/26/2018  2:50 AM

## 2018-12-26 NOTE — Progress Notes (Signed)
  Echocardiogram 2D Echocardiogram has been performed.  Dailah Opperman L Androw 12/26/2018, 4:09 PM

## 2018-12-26 NOTE — Progress Notes (Signed)
PT Cancellation Note  Patient Details Name: James [name] MRN: [id number] DOB: [birth year]   Cancelled Treatment:    Reason Eval/Treat Not Completed: Patient at procedure or test/unavailable(HD)   [place] 12/26/2018, 12:14 PM [place] Pager [phone] Office [phone]

## 2018-12-26 NOTE — TOC Progression Note (Signed)
Transition of Care [facility]) - Progression Note    Patient Details  Name: Irma Delancey MRN: [id number] Date of Birth: 01/10/1976  Transition of Care [facility] CM/SW [place], LCSW Phone Number: 12/26/2018, 1:20 PM  Clinical Narrative:    CSW received consult regarding patient request for substance use resources. CSW placed resources on shadow chart for patient to review at discharge.    Expected Discharge Plan: [place] Barriers to Discharge: Continued Medical Work up  Expected Discharge Plan and Services Expected Discharge Plan: [place] In-house Referral: Clinical Social Work Discharge Planning Services: CM Consult Post Acute Care Choice: Home Health, Durable Medical Equipment Living arrangements for the past 2 months: Apartment                 DME Arranged: Walker rolling   HH Arranged: PT, OT HH Agency: [place] ([facility])   Social Determinants of Health (SDOH) Interventions    Readmission Risk Interventions No flowsheet data found.

## 2018-12-26 NOTE — Progress Notes (Signed)
Patient ID: James [name], male   DOB: 16-Jun-1976, 43 y.o.   MRN: [id number]  PROGRESS NOTE    James [name]  HRC:[id number] DOB: 04/10/1976 DOA: 12/19/2018 PCP: [place], Betty G, MD   Brief Narrative:  43 year old male with history of hypertension, diabetes mellitus type 2, bipolar disorder presented on 12/17/2018 to [facility] after being found down for 2 days following binge alcohol drinking.  He was found to have AKI, metabolic acidosis, leukocytosis and LFT elevation with rhabdomyolysis, CK of 58,000.  He was treated with aggressive IV fluids but unfortunately his renal function got worse with decreased urine output and CK rose to 130,000.  No improvement with IV diuresis.  He was transferred to [facility] on 12/19/2018.  Nephrology was consulted.  Tunneled HD catheter was inserted and hemodialysis was started on 12/20/2018.  Assessment & Plan:   Principal Problem:   Acute renal failure due to traumatic rhabdomyolysis [facility]) Active Problems:   Diabetes mellitus ([place])   Bipolar affective disorder ([place])   Alcoholism ([place])  MSSA bacteremia causing fever -Blood cultures from 12/25/2018 growing MSSA.  Will repeat blood cultures on 12/27/2018.  Currently started on Ancef.  ID has been automatically consulted.  We will also get 2D echo.  Still spiking temperatures.  Might have to consider removing the dialysis catheter.  Acute renal failure due to rhabdomyolysis -Renal ultrasound was unremarkable -Nephrology following.  Tunneled HD catheter was inserted and hemodialysis was started on 12/20/2018.  Follow nephrology recommendations.  Patient is undergoing dialysis again today on 12/26/2018 -Repeat a.m. labs.  CK levels much improved.  Hypertension-blood pressure better.  Continue amlodipine.  IV hydralazine as needed  Alcohol abuse -Monitor for withdrawal.  Currently no signs of withdrawal.  Decrease Librium to 5 mg twice a day.  Patient was counseled regarding alcohol  cessation  Elevated LFTs -From rhabdomyolysis and alcohol abuse.  Hepatitis panel negative, HIV nonreactive -Improving.  Monitor.  Right upper quadrant ultrasound demonstrates early cirrhotic changes  Diabetes mellitus type 2 -Continue CBGs with exercise.  Home oral meds on hold  Elevated troponin -Due to muscle breakdown and impaired clearance.  No further interventions planned  Maculopapular rash -Improving  Obesity -Outpatient follow-up  Bipolar disorder/anxiety -continue current regimen.  Outpatient follow-up with psychiatry   DVT prophylaxis: Heparin Code Status: Full Family Communication: None at bedside Disposition Plan: Depends on clinical outcome Consultants: Nephrology  Procedures: Tunneled HD catheter insertion on 12/20/2018  Antimicrobials: None   Subjective: Patient seen and examined at bedside undergoing dialysis.  He is a poor historian.  He denies worsening cough or shortness of breath, abdominal pain or diarrhea.   Objective: Vitals:   12/26/18 0830 12/26/18 0900 12/26/18 0930 12/26/18 1000  BP: (!) 141/77 133/79 129/75 137/77  Pulse: 73 71 71 66  Resp: 17     Temp:      TempSrc:      SpO2:      Weight:      Height:        Intake/Output Summary (Last 24 hours) at 12/26/2018 1019 Last data filed at 12/26/2018 0349 Gross per 24 hour  Intake 378.94 ml  Output 75 ml  Net 303.94 ml   Filed Weights   12/25/18 0500 12/26/18 0625 12/26/18 0735  Weight: 125.3 kg 128.3 kg 128.4 kg    Examination:  General exam: Appears calm and comfortable.  No distress.  Poor historian Respiratory system: Bilateral decreased breath sounds at bases, some crackles.   Cardiovascular system: S1-S2  heard, rate controlled Gastrointestinal system: Abdomen is nondistended, soft and nontender. Normal bowel sounds heard. Extremities: No cyanosis; lower extremity 2+ edema present Lymph: No cervical lymphadenopathy CNS: Awake, answering questions.  No focal neurologic  deficit Psych: Flat affect Skin: Maculopapular rash on torso and neck.  Bruising on bilateral knees.  Left dorsal wrist with fluid-filled bulla which has ruptured and some erythema.    Data Reviewed: I have personally reviewed following labs and imaging studies  CBC: Recent Labs  Lab 12/19/18 1657  12/21/18 0229 12/22/18 0457 12/23/18 0213 12/24/18 0418 12/26/18 0347  WBC 11.6*   < > 7.7 7.0 7.0 6.8 10.5  NEUTROABS 10.0*  --   --   --   --   --  9.0*  HGB 12.7*   < > 10.0* 10.2* 9.4* 9.8* 9.0*  HCT 35.7*   < > 29.1* 29.6* 27.8* 28.7* 26.6*  MCV 89.0   < > 87.7 92.2 90.0 92.3 92.7  PLT 183   < > 193 121* 208 239 253   < > = values in this interval not displayed.   Basic Metabolic Panel: Recent Labs  Lab 12/19/18 1657  12/21/18 0229 12/22/18 0457 12/23/18 0213 12/24/18 0418 12/26/18 0347  NA 134*   < > 131* 131* 132* 129* 127*  K 4.1   < > 3.9 5.4* 4.0 4.0 5.7*  CL 108   < > 104 100 97* 92* 91*  CO2 15*   < > 20* 20* 24 23 23   GLUCOSE 106*   < > 85 86 87 82 98  BUN 43*   < > 40* 34* 28* 38* 46*  CREATININE 4.45*   < > 5.54* 5.54* 5.02* 6.96* 7.80*  CALCIUM 7.7*   < > 7.6* 7.9* 8.1* 8.5* 8.5*  MG 1.7  --   --   --  1.7 1.8 1.9  PHOS 4.6  --   --   --   --   --   --    < > = values in this interval not displayed.   GFR: Estimated Creatinine Clearance: 17.3 mL/min (A) (by C-G formula based on SCr of 7.8 mg/dL (H)). Liver Function Tests: Recent Labs  Lab 12/20/18 0551 12/21/18 0229 12/22/18 0457 12/23/18 0213 12/24/18 0418  AST 545* 531* 382* 299* 196*  ALT 141* 146* 131* 116* 96*  ALKPHOS 31* 43 57 54 48  BILITOT 0.6 1.0 2.0* 0.4 0.4  PROT 3.6* 4.4* 4.3* 4.7* 4.6*  ALBUMIN 1.9* 2.1* 2.1* 2.1* 2.0*   No results for input(s): LIPASE, AMYLASE in the last 168 hours. No results for input(s): AMMONIA in the last 168 hours. Coagulation Profile: Recent Labs  Lab 12/20/18 0728  INR 1.1   Cardiac Enzymes: Recent Labs  Lab 12/19/18 1657 12/20/18 0551  12/21/18 0229 12/24/18 0418 12/26/18 0347  CKTOTAL >50,000* 24,528* 28,554* 8,657* 3,103*   BNP (last 3 results) No results for input(s): PROBNP in the last 8760 hours. HbA1C: No results for input(s): HGBA1C in the last 72 hours. CBG: Recent Labs  Lab 12/24/18 2158 12/25/18 1312 12/25/18 1653 12/25/18 2117 12/26/18 0653  GLUCAP 141* 86 95 98 94   Lipid Profile: No results for input(s): CHOL, HDL, LDLCALC, TRIG, CHOLHDL, LDLDIRECT in the last 72 hours. Thyroid Function Tests: No results for input(s): TSH, T4TOTAL, FREET4, T3FREE, THYROIDAB in the last 72 hours. Anemia Panel: No results for input(s): VITAMINB12, FOLATE, FERRITIN, TIBC, IRON, RETICCTPCT in the last 72 hours. Sepsis Labs: No results for input(s): PROCALCITON,  LATICACIDVEN in the last 168 hours.  Recent Results (from the past 240 hour(s))  Culture, blood (routine x 2)     Status: None (Preliminary result)   Collection Time: 12/25/18 10:00 AM  Result Value Ref Range Status   Specimen Description BLOOD LEFT HAND  Final   Special Requests AEROBIC BOTTLE ONLY Blood Culture adequate volume  Final   Culture  Setup Time   Final    GRAM POSITIVE COCCI IN CLUSTERS AEROBIC BOTTLE ONLY CRITICAL VALUE NOTED.  VALUE IS CONSISTENT WITH PREVIOUSLY REPORTED AND CALLED VALUE. Performed at [facility], [place] [address], [place], [place] [address]    Culture GRAM POSITIVE COCCI  Final   Report Status PENDING  Incomplete  Culture, blood (routine x 2)     Status: None (Preliminary result)   Collection Time: 12/25/18 10:04 AM  Result Value Ref Range Status   Specimen Description BLOOD LEFT HAND  Final   Special Requests AEROBIC BOTTLE ONLY Blood Culture adequate volume  Final   Culture  Setup Time   Final    GRAM POSITIVE COCCI IN CLUSTERS AEROBIC BOTTLE ONLY CRITICAL RESULT CALLED TO, READ BACK BY AND VERIFIED WITH: Salli Real [id number] 12/26/2018 Mena Goes Performed at [facility], [address],  [place], [place] [address]    Culture GRAM POSITIVE COCCI  Final   Report Status PENDING  Incomplete  Blood Culture ID Panel (Reflexed)     Status: Abnormal   Collection Time: 12/25/18 10:04 AM  Result Value Ref Range Status   Enterococcus species NOT DETECTED NOT DETECTED Final   Listeria monocytogenes NOT DETECTED NOT DETECTED Final   Staphylococcus species DETECTED (A) NOT DETECTED Final    Comment: CRITICAL RESULT CALLED TO, READ BACK BY AND VERIFIED WITH: G. ABBOTT,PHARMD [id number] 12/26/2018 T. TYSOR    Staphylococcus aureus (BCID) DETECTED (A) NOT DETECTED Final    Comment: Methicillin (oxacillin) susceptible Staphylococcus aureus (MSSA). Preferred therapy is anti staphylococcal beta lactam antibiotic (Cefazolin or Nafcillin), unless clinically contraindicated. CRITICAL RESULT CALLED TO, READ BACK BY AND VERIFIED WITH: G. ABBOTT,PHARMD [id number] 12/26/2018 T. TYSOR    Methicillin resistance NOT DETECTED NOT DETECTED Final   Streptococcus species NOT DETECTED NOT DETECTED Final   Streptococcus agalactiae NOT DETECTED NOT DETECTED Final   Streptococcus pneumoniae NOT DETECTED NOT DETECTED Final   Streptococcus pyogenes NOT DETECTED NOT DETECTED Final   Acinetobacter baumannii NOT DETECTED NOT DETECTED Final   Enterobacteriaceae species NOT DETECTED NOT DETECTED Final   Enterobacter cloacae complex NOT DETECTED NOT DETECTED Final   Escherichia coli NOT DETECTED NOT DETECTED Final   Klebsiella oxytoca NOT DETECTED NOT DETECTED Final   Klebsiella pneumoniae NOT DETECTED NOT DETECTED Final   Proteus species NOT DETECTED NOT DETECTED Final   Serratia marcescens NOT DETECTED NOT DETECTED Final   Haemophilus influenzae NOT DETECTED NOT DETECTED Final   Neisseria meningitidis NOT DETECTED NOT DETECTED Final   Pseudomonas aeruginosa NOT DETECTED NOT DETECTED Final   Candida albicans NOT DETECTED NOT DETECTED Final   Candida glabrata NOT DETECTED NOT DETECTED Final   Candida krusei NOT DETECTED NOT  DETECTED Final   Candida parapsilosis NOT DETECTED NOT DETECTED Final   Candida tropicalis NOT DETECTED NOT DETECTED Final    Comment: Performed at [facility], [place] [address] [place], [place] [address]         Radiology Studies: Dg Chest Port 1 View  Result Date: 12/25/2018 CLINICAL DATA:  Dyspnea EXAM: PORTABLE CHEST 1 VIEW  COMPARISON:  12/17/2018 chest radiograph. FINDINGS: Right internal jugular central venous catheter terminates in the lower third of the SVC. Stable cardiomediastinal silhouette with normal heart size. No pneumothorax. Slight blunting of the right costophrenic angle. No left pleural effusion. No overt pulmonary edema. No acute consolidative airspace disease. IMPRESSION: 1. No overt pulmonary edema. 2. Slight blunting of the right costophrenic angle suggesting small right pleural effusion. Electronically Signed   By: Ilona Sorrel M.D.   On: 12/25/2018 08:38        Scheduled Meds:  amLODipine  10 mg Oral Daily   chlordiazePOXIDE  10 mg Oral BID   Chlorhexidine Gluconate Cloth  6 each Topical Q0600   FLUoxetine  40 mg Oral Daily   folic acid  1 mg Oral Daily   heparin  5,000 Units Subcutaneous Q8H   insulin aspart  0-5 Units Subcutaneous QHS   insulin aspart  0-9 Units Subcutaneous TID WC   loxapine  25 mg Oral Daily   loxapine  50 mg Oral QHS   metoprolol tartrate  25 mg Oral BID   polyethylene glycol  17 g Oral Daily   risperiDONE  9 mg Oral QHS   thiamine  100 mg Oral Daily   topiramate  250 mg Oral QHS   traZODone  300 mg Oral QHS   Continuous Infusions:  sodium chloride     sodium chloride     sodium chloride Stopped (12/26/18 0534)    ceFAZolin (ANCEF) IV       LOS: 7 days        Aline August, MD [facility] 12/26/2018, 10:19 AM

## 2018-12-26 NOTE — Progress Notes (Signed)
Physical Therapy Treatment Patient Details Name: James [name] MRN: [id number] DOB: May 05, 1976 Today's Date: 12/26/2018    History of Present Illness Pt is a 43 y/o male admitted after fall. Found to have acute renal failure secondary to rhabdomyolysis. Pt is s/p tunneled HD catheter and started HD on 4/7. PMH includes DM, bipolar disorder, HTN, and alcohol abuse.     PT Comments    Pt making steady progress.    Follow Up Recommendations  Home health PT;Supervision for mobility/OOB(HHPT pending progression )     Equipment Recommendations  Rolling walker with 5" wheels    Recommendations for Other Services       Precautions / Restrictions Precautions Precautions: Fall Restrictions Weight Bearing Restrictions: No    Mobility  Bed Mobility               General bed mobility comments: Pt up in chair  Transfers Overall transfer level: Needs assistance Equipment used: Rolling walker (2 wheeled) Transfers: Sit to/from Stand Sit to Stand: Mod assist         General transfer comment: Assist to bring hips up from low chair.  Ambulation/Gait Ambulation/Gait assistance: Min guard Gait Distance (Feet): 110 Feet Assistive device: Rolling walker (2 wheeled) Gait Pattern/deviations: Step-through pattern;Decreased stride length;Wide base of support Gait velocity: Decreased  Gait velocity interpretation: <1.31 ft/sec, indicative of household ambulator General Gait Details: Assist for safety.    Stairs             Wheelchair Mobility    Modified Rankin (Stroke Patients Only)       Balance Overall balance assessment: Needs assistance Sitting-balance support: No upper extremity supported;Feet supported Sitting balance-Leahy Scale: Good     Standing balance support: Bilateral upper extremity supported;During functional activity Standing balance-Leahy Scale: Poor Standing balance comment: walker and supervision for static standing                             Cognition Arousal/Alertness: Awake/alert Behavior During Therapy: WFL for tasks assessed/performed Overall Cognitive Status: Within Functional Limits for tasks assessed                                        Exercises      General Comments        Pertinent Vitals/Pain Pain Assessment: Faces Faces Pain Scale: Hurts little more Pain Location: BLE  Pain Descriptors / Indicators: Guarding;Tightness Pain Intervention(s): Limited activity within patient's tolerance    Home Living                      Prior Function            PT Goals (current goals can now be found in the care plan section) Progress towards PT goals: Progressing toward goals    Frequency    Min 3X/week      PT Plan Current plan remains appropriate    Co-evaluation              AM-PAC PT "6 Clicks" Mobility   Outcome Measure  Help needed turning from your back to your side while in a flat bed without using bedrails?: A Lot Help needed moving from lying on your back to sitting on the side of a flat bed without using bedrails?: A Lot Help needed moving to and from a bed to a  chair (including a wheelchair)?: A Little Help needed standing up from a chair using your arms (e.g., wheelchair or bedside chair)?: A Lot Help needed to walk in hospital room?: A Little Help needed climbing 3-5 steps with a railing? : A Lot 6 Click Score: 14    End of Session Equipment Utilized During Treatment: Gait belt Activity Tolerance: Patient tolerated treatment well Patient left: with call bell/phone within reach;in chair Nurse Communication: Mobility status PT Visit Diagnosis: Other abnormalities of gait and mobility (R26.89);Unsteadiness on feet (R26.81);Muscle weakness (generalized) (M62.81)     Time: 3730-8168 PT Time Calculation (min) (ACUTE ONLY): 13 min  Charges:  $Gait Training: 8-22 mins                     [place] Pager [phone] Office [place] 12/26/2018, 2:54 PM

## 2018-12-26 NOTE — Progress Notes (Signed)
Pt temp increased to 102.1 after PRN tylenol. PRN tylenol not due yet. Ice packs applied around pt. Fan placed on pt and covers removed. Will continue to monitor.

## 2018-12-26 NOTE — Progress Notes (Signed)
Patient ID: James [name], male   DOB: 1976-06-28, 43 y.o.   MRN: [id number] [facility] Progress Note   Assessment/ Plan:   1. Acute kidney Injury: Anuric overnight.  Etiology suspected to be pigment nephropathy from rhabdomyolysis versus hemodynamically mediated in the setting of ongoing ACE inhibitor/Farxiga use.  Continue hemodialysis at this time for management of multiple metabolic abnormalities and azotemia. 2.  Hyperkalemia:  Secondary to acute kidney injury, monitor with hemodialysis. 3.  Bipolar disorder/anxiety: Ongoing management per primary service. 4.  History of alcohol abuse: We will continue to monitor closely for withdrawal symptoms, initial ultrasound suggestive of early development of cirrhosis (suspect fatty liver has a contributory effect). 5.  Hyponatremia: Secondary to acute kidney injury and unrestricted oral fluid intake-reeducated need for restricting oral fluids.  Subjective:   Reports to be feeling fair-concerned about long-term dialysis.   Objective:   BP (!) 141/77 (BP Location: Left Arm)   Pulse 73   Temp 98.8 F (37.1 C) (Oral)   Resp 17   Ht 6\' 3"  (1.905 m)   Wt 128.4 kg   SpO2 99%   BMI 35.38 kg/m   Intake/Output Summary (Last 24 hours) at 12/26/2018 1003 Last data filed at 12/26/2018 0349 Gross per 24 hour  Intake 378.94 ml  Output 75 ml  Net 303.94 ml   Weight change: 0.9 kg  Physical Exam: Gen: Appears to be comfortable in hemodialysis CVS: Pulse regular rhythm, normal rate, S1 and S2 normal Resp: Anteriorly clear to auscultation, no rales Abd: Soft, obese, nontender Ext: 2+ edema over upper and lower extremities/anasarca.  Imaging: Dg Chest Port 1 View  Result Date: 12/25/2018 CLINICAL DATA:  Dyspnea EXAM: PORTABLE CHEST 1 VIEW COMPARISON:  12/17/2018 chest radiograph. FINDINGS: Right internal jugular central venous catheter terminates in the lower third of the SVC. Stable cardiomediastinal silhouette with normal  heart size. No pneumothorax. Slight blunting of the right costophrenic angle. No left pleural effusion. No overt pulmonary edema. No acute consolidative airspace disease. IMPRESSION: 1. No overt pulmonary edema. 2. Slight blunting of the right costophrenic angle suggesting small right pleural effusion. Electronically Signed   By: Ilona Sorrel M.D.   On: 12/25/2018 08:38    Labs: BMET Recent Labs  Lab 12/19/18 1657 12/20/18 0551 12/21/18 0229 12/22/18 0457 12/23/18 0213 12/24/18 0418 12/26/18 0347  NA 134* 133* 131* 131* 132* 129* 127*  K 4.1 3.2* 3.9 5.4* 4.0 4.0 5.7*  CL 108 92* 104 100 97* 92* 91*  CO2 15* 33* 20* 20* 24 23 23   GLUCOSE 106* 79 85 86 87 82 98  BUN 43* 42* 40* 34* 28* 38* 46*  CREATININE 4.45* 4.46* 5.54* 5.54* 5.02* 6.96* 7.80*  CALCIUM 7.7* 6.4* 7.6* 7.9* 8.1* 8.5* 8.5*  PHOS 4.6  --   --   --   --   --   --    CBC Recent Labs  Lab 12/19/18 1657  12/22/18 0457 12/23/18 0213 12/24/18 0418 12/26/18 0347  WBC 11.6*   < > 7.0 7.0 6.8 10.5  NEUTROABS 10.0*  --   --   --   --  9.0*  HGB 12.7*   < > 10.2* 9.4* 9.8* 9.0*  HCT 35.7*   < > 29.6* 27.8* 28.7* 26.6*  MCV 89.0   < > 92.2 90.0 92.3 92.7  PLT 183   < > 121* 208 239 253   < > = values in this interval not displayed.    Medications:    .  amLODipine  10 mg Oral Daily  . chlordiazePOXIDE  10 mg Oral BID  . Chlorhexidine Gluconate Cloth  6 each Topical Q0600  . FLUoxetine  40 mg Oral Daily  . folic acid  1 mg Oral Daily  . heparin  5,000 Units Subcutaneous Q8H  . insulin aspart  0-5 Units Subcutaneous QHS  . insulin aspart  0-9 Units Subcutaneous TID WC  . loxapine  25 mg Oral Daily  . loxapine  50 mg Oral QHS  . metoprolol tartrate  25 mg Oral BID  . polyethylene glycol  17 g Oral Daily  . risperiDONE  9 mg Oral QHS  . thiamine  100 mg Oral Daily  . topiramate  250 mg Oral QHS  . traZODone  300 mg Oral QHS   Elmarie Shiley, MD 12/26/2018, 10:03 AM

## 2018-12-26 NOTE — Consult Note (Addendum)
[facility] for Infectious Disease    Date of Admission:  12/19/2018      Total days of antibiotics 1 Cefazolin                 Reason for Consult: MSSA Bacteremia     Referring Provider:  Jacklyn Shell  Primary Care Provider: [place], Betty G, MD   Assessment: James [name] is a 43 y.o. male admitted from [facility] (12/17/2018, transferred 4/06) with worsening AKI in the setting of rhabdomyolysis. He has been undergoing HD sessions regularly since 4/07 via a new HD catheter placed into the R IJ. He spiked fevers on hospital day 6 to > 101 F and found to have methicillin sensitive bacteremia. Unfortunately this is likely nosocomial acquired infection given he has been hospitalized for over a week and no previous fevers/signs of infections.   Would continue treatment with cefazolin. If possible to remove his HD line for a holiday this would be preferred, although understandable with daily HD assessments this may not immediately be possible. Would also remove L forearm peripheral IV as this likely has been in place for a while (looks like a Warden/ranger).   He has no other signs at present for metastatic infections. Understandably he is uncomfortable with elevated CK levels with all over myalgias/anasarca but these seem to be improving.   Plan: 1. Continue cefazolin  2. TTE pending 3. Remove peripheral IV from L Forearm please  4. Repeat BCx in AM 5. Will need to remove/exchange line with a holiday if possible  Principal Problem:   MSSA bacteremia Active Problems:   Diabetes mellitus ([place])   Bipolar affective disorder ([place])   Acute renal failure due to traumatic rhabdomyolysis ([place])   Alcoholism ([place])   . amLODipine  10 mg Oral Daily  . chlordiazePOXIDE  5 mg Oral BID  . Chlorhexidine Gluconate Cloth  6 each Topical Q0600  . FLUoxetine  40 mg Oral Daily  . folic acid  1 mg Oral Daily  . heparin  5,000 Units Subcutaneous Q8H  . insulin aspart   0-5 Units Subcutaneous QHS  . insulin aspart  0-9 Units Subcutaneous TID WC  . loxapine  25 mg Oral Daily  . loxapine  50 mg Oral QHS  . metoprolol tartrate  25 mg Oral BID  . polyethylene glycol  17 g Oral Daily  . risperiDONE  9 mg Oral QHS  . thiamine  100 mg Oral Daily  . topiramate  250 mg Oral QHS  . traZODone  300 mg Oral QHS    HPI:  James [name] is a 43 y.o. male with medical history 43 year old male with history of hypertension, well controlled NIDDM-2 and bipolar disorder presenting to emergency department via EMS after found down on floor for 2 days. Patient reports drinking liquor on Friday 4-03; he then fell of bed and could not get up for two days. His mother found him down on the floor and called EMS. Denies prior history of withdrawal, seizure or hospitalization for alcohol but does report intermittent binge drinking. At [facility] his labs demonstrated leukocytosis (17K), AST 712 ALT 82, Cr 2.9 (baseline 0.7), Lactate 4.2 and CK 58,000 with myoglobin > 80,000, BNP 2400. CT head and C-spine w/o infusion normal. RUQ concerning for early changes d/t cirrhosis.   He was transferred to [facility] 12/19/18 for worsening AKI in the setting of rhabdomyolysis and need for nephrology care. Upon arrival to  [facility] he was afebrile with no leukocytosis. He was not on antibiotics prior to arrival from what I can see. Tunneled HD catheter was placed into R IJ on 12/20/2018.   On hospital day 6 he developed fevers > 101 F. Blood cultures were drawn, positive 2/2 sets staph aureus on BCID. He was started on Cefazolin for treatment.   2D echo pending as of today.   Review of Systems: Review of Systems  Constitutional: Positive for chills, fever and malaise/fatigue.  Respiratory: Negative for cough and shortness of breath.   Cardiovascular: Positive for leg swelling. Negative for chest pain and orthopnea.  Gastrointestinal: Negative for abdominal pain and diarrhea.  Genitourinary:        On HD   Musculoskeletal: Positive for myalgias.  Skin: Negative for rash.       Abrasions to both knees from fall and trying to get up.   Neurological: Positive for weakness. Negative for headaches.    Past Medical History:  Diagnosis Date  . Bipolar 1 disorder ([place])   . Cellulitis 08/24/2012   "RLE and spot on my right forearm" (08/24/2012)  . Type II diabetes mellitus ([place]) ~ 2009   "take Metformin" (08/24/2012)    Social History   Tobacco Use  . Smoking status: Former Smoker    Packs/day: 0.00    Years: 25.00    Pack years: 0.00  . Smokeless tobacco: Never Used  . Tobacco comment: 08/24/2012 "stopped both snuff and chew ~ 10 yr ago"  Substance Use Topics  . Alcohol use: Yes    Alcohol/week: 12.0 standard drinks    Types: 12 Cans of beer per week  . Drug use: No    Family History  Problem Relation Age of Onset  . Diabetic kidney disease Maternal Uncle    No Known Allergies  OBJECTIVE: Blood pressure 137/77, pulse 66, temperature 98.8 F (37.1 C), temperature source Oral, resp. rate 17, height 6\' 3"  (1.905 m), weight 128.4 kg, SpO2 99 %.  Physical Exam Constitutional:      General: He is not in acute distress.    Comments: Resting in bed undergoing HD session.   Eyes:     Conjunctiva/sclera: Conjunctivae normal.     Pupils: Pupils are equal, round, and reactive to light.  Neck:     Musculoskeletal: Normal range of motion.  Cardiovascular:     Rate and Rhythm: Normal rate.     Heart sounds: No murmur.  Pulmonary:     Effort: Pulmonary effort is normal.     Breath sounds: Normal breath sounds.     Comments: Decreased bibasilar  Abdominal:     General: There is distension.  Skin:    General: Skin is warm.     Findings: Lesion (Heavily scabbed knees and multiple small abrasions on lower extremities/feet.) present. No rash.     Comments: anasarca  L FA with PIV in place. Cool over site with some redness. Not painful with palpation surrounding site.  Dressing is clean/dry.   Neurological:     General: No focal deficit present.     Mental Status: He is alert and oriented to person, place, and time.     Motor: Weakness present.     Lab Results Lab Results  Component Value Date   WBC 10.5 12/26/2018   HGB 9.0 (L) 12/26/2018   HCT 26.6 (L) 12/26/2018   MCV 92.7 12/26/2018   PLT 253 12/26/2018    Lab Results  Component Value Date   CREATININE  7.80 (H) 12/26/2018   BUN 46 (H) 12/26/2018   NA 127 (L) 12/26/2018   K 5.7 (H) 12/26/2018   CL 91 (L) 12/26/2018   CO2 23 12/26/2018    Lab Results  Component Value Date   ALT 96 (H) 12/24/2018   AST 196 (H) 12/24/2018   ALKPHOS 48 12/24/2018   BILITOT 0.4 12/24/2018     Microbiology: Recent Results (from the past 240 hour(s))  Culture, blood (routine x 2)     Status: None (Preliminary result)   Collection Time: 12/25/18 10:00 AM  Result Value Ref Range Status   Specimen Description BLOOD LEFT HAND  Final   Special Requests AEROBIC BOTTLE ONLY Blood Culture adequate volume  Final   Culture  Setup Time   Final    GRAM POSITIVE COCCI IN CLUSTERS AEROBIC BOTTLE ONLY CRITICAL VALUE NOTED.  VALUE IS CONSISTENT WITH PREVIOUSLY REPORTED AND CALLED VALUE. Performed at [facility], [place] [address] [place], [place] [address]    Culture GRAM POSITIVE COCCI  Final   Report Status PENDING  Incomplete  Culture, blood (routine x 2)     Status: None (Preliminary result)   Collection Time: 12/25/18 10:04 AM  Result Value Ref Range Status   Specimen Description BLOOD LEFT HAND  Final   Special Requests AEROBIC BOTTLE ONLY Blood Culture adequate volume  Final   Culture  Setup Time   Final    GRAM POSITIVE COCCI IN CLUSTERS AEROBIC BOTTLE ONLY CRITICAL RESULT CALLED TO, READ BACK BY AND VERIFIED WITH: Salli Real [id number] 12/26/2018 Mena Goes Performed at [facility], [address], [place], [place] [address]    Culture GRAM POSITIVE COCCI  Final   Report Status PENDING   Incomplete  Blood Culture ID Panel (Reflexed)     Status: Abnormal   Collection Time: 12/25/18 10:04 AM  Result Value Ref Range Status   Enterococcus species NOT DETECTED NOT DETECTED Final   Listeria monocytogenes NOT DETECTED NOT DETECTED Final   Staphylococcus species DETECTED (A) NOT DETECTED Final    Comment: CRITICAL RESULT CALLED TO, READ BACK BY AND VERIFIED WITH: G. ABBOTT,PHARMD [id number] 12/26/2018 T. TYSOR    Staphylococcus aureus (BCID) DETECTED (A) NOT DETECTED Final    Comment: Methicillin (oxacillin) susceptible Staphylococcus aureus (MSSA). Preferred therapy is anti staphylococcal beta lactam antibiotic (Cefazolin or Nafcillin), unless clinically contraindicated. CRITICAL RESULT CALLED TO, READ BACK BY AND VERIFIED WITH: G. ABBOTT,PHARMD [id number] 12/26/2018 T. TYSOR    Methicillin resistance NOT DETECTED NOT DETECTED Final   Streptococcus species NOT DETECTED NOT DETECTED Final   Streptococcus agalactiae NOT DETECTED NOT DETECTED Final   Streptococcus pneumoniae NOT DETECTED NOT DETECTED Final   Streptococcus pyogenes NOT DETECTED NOT DETECTED Final   Acinetobacter baumannii NOT DETECTED NOT DETECTED Final   Enterobacteriaceae species NOT DETECTED NOT DETECTED Final   Enterobacter cloacae complex NOT DETECTED NOT DETECTED Final   Escherichia coli NOT DETECTED NOT DETECTED Final   Klebsiella oxytoca NOT DETECTED NOT DETECTED Final   Klebsiella pneumoniae NOT DETECTED NOT DETECTED Final   Proteus species NOT DETECTED NOT DETECTED Final   Serratia marcescens NOT DETECTED NOT DETECTED Final   Haemophilus influenzae NOT DETECTED NOT DETECTED Final   Neisseria meningitidis NOT DETECTED NOT DETECTED Final   Pseudomonas aeruginosa NOT DETECTED NOT DETECTED Final   Candida albicans NOT DETECTED NOT DETECTED Final   Candida glabrata NOT DETECTED NOT DETECTED Final   Candida krusei NOT DETECTED NOT DETECTED Final   Candida  parapsilosis NOT DETECTED NOT DETECTED Final   Candida  tropicalis NOT DETECTED NOT DETECTED Final    Comment: Performed at [facility], [place] [address] [place], [place] [address]    Janene Madeira, MSN, NP-C [facility] for Infectious [place] Cell: [phone] Pager: [phone]  12/26/2018 11:23 AM

## 2018-12-26 NOTE — Progress Notes (Signed)
OT Cancellation Note  Patient Details Name: James [name] MRN: [id number] DOB: 1976/07/21   Cancelled Treatment:    Reason Eval/Treat Not Completed: Patient at procedure or test/ unavailable(HD). Will continue to follow.  [name] 12/26/2018, 8:06 AM  Nestor Lewandowsky, OTR/L Acute Rehabilitation Services Pager: [phone] Office: [phone]

## 2018-12-27 DIAGNOSIS — T796XXA Traumatic ischemia of muscle, initial encounter: Secondary | ICD-10-CM

## 2018-12-27 DIAGNOSIS — I081 Rheumatic disorders of both mitral and tricuspid valves: Secondary | ICD-10-CM

## 2018-12-27 LAB — URINE CULTURE: Culture: >=100000 — AB

## 2018-12-27 LAB — GLUCOSE, CAPILLARY
Glucose-Capillary: 93 mg/dL (ref 70–99)
Glucose-Capillary: 100 mg/dL — ABNORMAL HIGH (ref 70–99)
Glucose-Capillary: 122 mg/dL — ABNORMAL HIGH (ref 70–99)
Glucose-Capillary: 102 mg/dL — ABNORMAL HIGH (ref 70–99)

## 2018-12-27 LAB — CBC WITH DIFFERENTIAL/PLATELET
Abs Immature Granulocytes: 0.06 10*3/uL (ref 0.00–0.07)
Basophils Absolute: 0 10*3/uL (ref 0.0–0.1)
Basophils Relative: 0 %
Eosinophils Absolute: 0.2 10*3/uL (ref 0.0–0.5)
Eosinophils Relative: 2 %
HCT: 24.6 % — ABNORMAL LOW (ref 39.0–52.0)
Hemoglobin: 8.2 g/dL — ABNORMAL LOW (ref 13.0–17.0)
Immature Granulocytes: 1 %
Lymphocytes Relative: 11 %
Lymphs Abs: 1 10*3/uL (ref 0.7–4.0)
MCH: 31.3 pg (ref 26.0–34.0)
MCHC: 33.3 g/dL (ref 30.0–36.0)
MCV: 93.9 fL (ref 80.0–100.0)
Monocytes Absolute: 0.8 10*3/uL (ref 0.1–1.0)
Monocytes Relative: 9 %
Neutro Abs: 6.9 10*3/uL (ref 1.7–7.7)
Neutrophils Relative %: 77 %
Platelets: 242 10*3/uL (ref 150–400)
RBC: 2.62 MIL/uL — ABNORMAL LOW (ref 4.22–5.81)
RDW: 12.6 % (ref 11.5–15.5)
WBC: 9 10*3/uL (ref 4.0–10.5)
nRBC: 0 % (ref 0.0–0.2)

## 2018-12-27 LAB — BASIC METABOLIC PANEL
Anion gap: 11 (ref 5–15)
BUN: 41 mg/dL — ABNORMAL HIGH (ref 6–20)
CO2: 26 mmol/L (ref 22–32)
Calcium: 8.2 mg/dL — ABNORMAL LOW (ref 8.9–10.3)
Chloride: 92 mmol/L — ABNORMAL LOW (ref 98–111)
Creatinine, Ser: 6.26 mg/dL — ABNORMAL HIGH (ref 0.61–1.24)
GFR calc Af Amer: 11 mL/min — ABNORMAL LOW (ref >60)
GFR calc non Af Amer: 10 mL/min — ABNORMAL LOW (ref >60)
Glucose, Bld: 88 mg/dL (ref 70–99)
Potassium: 4.6 mmol/L (ref 3.5–5.1)
Sodium: 129 mmol/L — ABNORMAL LOW (ref 135–145)

## 2018-12-27 LAB — MAGNESIUM: Magnesium: 2.1 mg/dL (ref 1.7–2.4)

## 2018-12-27 MED ORDER — CEFAZOLIN SODIUM-DEXTROSE 1-4 GM/50ML-% IV SOLN
1.0000 g | INTRAVENOUS | Status: DC
  Administered 2018-12-27 – 2019-01-01 (×6): 1 g via INTRAVENOUS
  Filled 2018-12-27 (×8): qty 50

## 2018-12-27 MED ORDER — HEPARIN SODIUM (PORCINE) 1000 UNIT/ML IJ SOLN
INTRAMUSCULAR | Status: AC
  Filled 2018-12-27: qty 3

## 2018-12-27 MED ORDER — HEPARIN SODIUM (PORCINE) 1000 UNIT/ML IJ SOLN
2.8000 mL | Freq: Once | INTRAMUSCULAR | Status: AC
  Administered 2018-12-27: 2800 [IU] via INTRAVENOUS

## 2018-12-27 MED ORDER — SENNOSIDES-DOCUSATE SODIUM 8.6-50 MG PO TABS
1.0000 | ORAL_TABLET | Freq: Two times a day (BID) | ORAL | Status: DC
  Administered 2018-12-27 – 2019-01-15 (×32): 1 via ORAL
  Filled 2018-12-27 (×34): qty 1

## 2018-12-27 MED ORDER — BISACODYL 10 MG RE SUPP
10.0000 mg | Freq: Every day | RECTAL | Status: DC | PRN
  Administered 2019-01-03: 10 mg via RECTAL
  Filled 2018-12-27: qty 1

## 2018-12-27 NOTE — Progress Notes (Signed)
Physical Therapy Treatment Patient Details Name: James [name] MRN: [id number] DOB: 06-Aug-1976 Today's Date: 12/27/2018    History of Present Illness Pt is a 43 y/o male admitted after fall. Found to have acute renal failure secondary to rhabdomyolysis. Pt is s/p tunneled HD catheter and started HD on 4/7. PMH includes DM, bipolar disorder, HTN, and alcohol abuse.     PT Comments    Pt progressing towards physical therapy goals. Pt engaged with session however very flat and with decreased attention to task at times. Pt would benefit from family supervision or at least checking in on him frequently during the day upon return home. Will continue to follow and progress as able per POC.    Follow Up Recommendations  Home health PT;Supervision for mobility/OOB     Equipment Recommendations  Rolling walker with 5" wheels    Recommendations for Other Services       Precautions / Restrictions Precautions Precautions: Fall Restrictions Weight Bearing Restrictions: No    Mobility  Bed Mobility Overal bed mobility: Needs Assistance Bed Mobility: Sit to Supine       Sit to supine: Supervision   General bed mobility comments: No assist required for transition back to supine. Min use of rails.   Transfers Overall transfer level: Needs assistance Equipment used: Rolling walker (2 wheeled) Transfers: Sit to/from Stand Sit to Stand: Mod assist;Supervision         General transfer comment: Initially, pt required mod assist to power-up to full stand from recliner chair. By end of session, pt able to power-up with supervision for safety, utilizing momentum to initiate.   Ambulation/Gait Ambulation/Gait assistance: Min guard Gait Distance (Feet): 175 Feet Assistive device: Rolling walker (2 wheeled) Gait Pattern/deviations: Step-through pattern;Decreased stride length;Wide base of support Gait velocity: Decreased  Gait velocity interpretation: <1.8 ft/sec, indicate of  risk for recurrent falls General Gait Details: Hands-on guarding throughout gait training for safety. Pt moving slow and with slightly uncoordinated gait pattern. No overt LOB noted.    Stairs             Wheelchair Mobility    Modified Rankin (Stroke Patients Only)       Balance Overall balance assessment: Needs assistance Sitting-balance support: No upper extremity supported;Feet supported Sitting balance-Leahy Scale: Good     Standing balance support: Bilateral upper extremity supported;During functional activity Standing balance-Leahy Scale: Poor Standing balance comment: walker and supervision for static standing                            Cognition Arousal/Alertness: Awake/alert Behavior During Therapy: Flat affect Overall Cognitive Status: Within Functional Limits for tasks assessed (occasional decreased attention to task)                                        Exercises      General Comments        Pertinent Vitals/Pain Pain Assessment: No/denies pain Pain Intervention(s): Monitored during session    Home Living                      Prior Function            PT Goals (current goals can now be found in the care plan section) Acute Rehab PT Goals Patient Stated Goal: to get better PT Goal Formulation: With patient Time  For Goal Achievement: 01/06/19 Potential to Achieve Goals: Good Progress towards PT goals: Progressing toward goals    Frequency    Min 3X/week      PT Plan Current plan remains appropriate    Co-evaluation              AM-PAC PT "6 Clicks" Mobility   Outcome Measure  Help needed turning from your back to your side while in a flat bed without using bedrails?: None Help needed moving from lying on your back to sitting on the side of a flat bed without using bedrails?: None Help needed moving to and from a bed to a chair (including a wheelchair)?: A Little Help needed standing  up from a chair using your arms (e.g., wheelchair or bedside chair)?: A Little Help needed to walk in hospital room?: A Little Help needed climbing 3-5 steps with a railing? : A Little 6 Click Score: 20    End of Session Equipment Utilized During Treatment: Gait belt Activity Tolerance: Patient tolerated treatment well Patient left: with call bell/phone within reach;in chair Nurse Communication: Mobility status PT Visit Diagnosis: Other abnormalities of gait and mobility (R26.89);Unsteadiness on feet (R26.81);Muscle weakness (generalized) (M62.81)     Time: 4709-6283 PT Time Calculation (min) (ACUTE ONLY): 20 min  Charges:  $Gait Training: 8-22 mins                     Rolinda Roan, PT, DPT Acute Rehabilitation Services Pager: [phone] Office: [phone]    Thelma Comp 12/27/2018, 9:19 AM

## 2018-12-27 NOTE — Progress Notes (Signed)
OT Cancellation Note  Patient Details Name: James [name] MRN: [id number] DOB: [birth year]   Cancelled Treatment:    Reason Eval/Treat Not Completed: Patient at procedure or test/ unavailable (HD); will follow up for OT treatment as schedule permits.  Lou Cal, OT Supplemental Rehabilitation Services Pager [phone] Office [phone]   Raymondo Band 12/27/2018, 3:02 PM

## 2018-12-27 NOTE — Progress Notes (Signed)
[facility] for Infectious Disease  Date of Admission:  12/19/2018     Total days of antibiotics 2 Cefazolin          Patient ID: James [name] is a 43 y.o. male with  Principal Problem:   MSSA bacteremia Active Problems:   Diabetes mellitus ([place])   Bipolar affective disorder ([place])   ARF (acute renal failure) ([place])   Alcoholism ([place])   AKI (acute kidney injury) ([place])   Hemodialysis catheter infection ([place])   . amLODipine  10 mg Oral Daily  . Chlorhexidine Gluconate Cloth  6 each Topical Q0600  . feeding supplement (ENSURE ENLIVE)  237 mL Oral BID BM  . FLUoxetine  40 mg Oral Daily  . folic acid  1 mg Oral Daily  . heparin  5,000 Units Subcutaneous Q8H  . insulin aspart  0-5 Units Subcutaneous QHS  . insulin aspart  0-9 Units Subcutaneous TID WC  . loxapine  25 mg Oral Daily  . loxapine  50 mg Oral QHS  . metoprolol tartrate  25 mg Oral BID  . polyethylene glycol  17 g Oral Daily  . risperiDONE  9 mg Oral QHS  . senna-docusate  1 tablet Oral BID  . thiamine  100 mg Oral Daily  . topiramate  250 mg Oral QHS  . traZODone  300 mg Oral QHS    SUBJECTIVE: Sleeping. No new complaints. Worried about his infection and how he got it. Says his kidney team told him he will have his HD line removed but not sure when.   Review of Systems: Review of Systems  Constitutional: Positive for malaise/fatigue. Negative for fever.  Musculoskeletal: Negative for myalgias.  Skin: Negative for rash.    No Known Allergies  OBJECTIVE: Vitals:   12/26/18 1150 12/26/18 1336 12/26/18 2220 12/27/18 0520  BP: (!) 155/80 (!) 151/88 135/76 132/76  Pulse: 80 93 73 71  Resp: 18 (!) 21 14 16   Temp: 98.2 F (36.8 C) 99.4 F (37.4 C) 100.1 F (37.8 C) 98.9 F (37.2 C)  TempSrc: Oral Oral Oral Oral  SpO2: 98% 95% 91% 94%  Weight: 124 kg   123.2 kg  Height:       Body mass index is 33.95 kg/m.   Physical Exam Constitutional:      Comments: Resting when entering  his room. No distress.   HENT:     Mouth/Throat:     Mouth: Mucous membranes are moist.     Pharynx: Oropharynx is clear.  Neck:     Comments: R IJ HD line in place. Dressing peeling off.  Cardiovascular:     Rate and Rhythm: Normal rate.     Pulses: Normal pulses.     Heart sounds: No murmur.  Pulmonary:     Effort: Pulmonary effort is normal.     Breath sounds: Normal breath sounds.  Skin:    General: Skin is warm and dry.     Capillary Refill: Capillary refill takes less than 2 seconds.     Comments: Multiple areas of scabbed over abrasions as previously documented. Remains generally edematous.   Neurological:     Mental Status: He is oriented to person, place, and time.     Lab Results Lab Results  Component Value Date   WBC 9.0 12/27/2018   HGB 8.2 (L) 12/27/2018   HCT 24.6 (L) 12/27/2018   MCV 93.9 12/27/2018   PLT 242 12/27/2018    Lab  Results  Component Value Date   CREATININE 6.26 (H) 12/27/2018   BUN 41 (H) 12/27/2018   NA 129 (L) 12/27/2018   K 4.6 12/27/2018   CL 92 (L) 12/27/2018   CO2 26 12/27/2018    Lab Results  Component Value Date   ALT 96 (H) 12/24/2018   AST 196 (H) 12/24/2018   ALKPHOS 48 12/24/2018   BILITOT 0.4 12/24/2018     Microbiology: Recent Results (from the past 240 hour(s))  Culture, blood (routine x 2)     Status: Abnormal (Preliminary result)   Collection Time: 12/25/18 10:00 AM  Result Value Ref Range Status   Specimen Description BLOOD LEFT HAND  Final   Special Requests AEROBIC BOTTLE ONLY Blood Culture adequate volume  Final   Culture  Setup Time   Final    GRAM POSITIVE COCCI IN CLUSTERS AEROBIC BOTTLE ONLY CRITICAL VALUE NOTED.  VALUE IS CONSISTENT WITH PREVIOUSLY REPORTED AND CALLED VALUE.    Culture (A)  Final    STAPHYLOCOCCUS AUREUS SUSCEPTIBILITIES TO FOLLOW Performed at [facility], [place] [address] [place], [place] [address]    Report Status PENDING  Incomplete  Culture, blood (routine x 2)      Status: Abnormal (Preliminary result)   Collection Time: 12/25/18 10:04 AM  Result Value Ref Range Status   Specimen Description BLOOD LEFT HAND  Final   Special Requests AEROBIC BOTTLE ONLY Blood Culture adequate volume  Final   Culture  Setup Time   Final    GRAM POSITIVE COCCI IN CLUSTERS AEROBIC BOTTLE ONLY CRITICAL RESULT CALLED TO, READ BACK BY AND VERIFIED WITH: G. ABBOTT,PHARMD [id number] 12/26/2018 T. TYSOR    Culture (A)  Final    STAPHYLOCOCCUS AUREUS SUSCEPTIBILITIES TO FOLLOW Performed at [facility], [place] [address], [place], [place] [address]    Report Status PENDING  Incomplete  Blood Culture ID Panel (Reflexed)     Status: Abnormal   Collection Time: 12/25/18 10:04 AM  Result Value Ref Range Status   Enterococcus species NOT DETECTED NOT DETECTED Final   Listeria monocytogenes NOT DETECTED NOT DETECTED Final   Staphylococcus species DETECTED (A) NOT DETECTED Final    Comment: CRITICAL RESULT CALLED TO, READ BACK BY AND VERIFIED WITH: G. ABBOTT,PHARMD [id number] 12/26/2018 T. TYSOR    Staphylococcus aureus (BCID) DETECTED (A) NOT DETECTED Final    Comment: Methicillin (oxacillin) susceptible Staphylococcus aureus (MSSA). Preferred therapy is anti staphylococcal beta lactam antibiotic (Cefazolin or Nafcillin), unless clinically contraindicated. CRITICAL RESULT CALLED TO, READ BACK BY AND VERIFIED WITH: G. ABBOTT,PHARMD [id number] 12/26/2018 T. TYSOR    Methicillin resistance NOT DETECTED NOT DETECTED Final   Streptococcus species NOT DETECTED NOT DETECTED Final   Streptococcus agalactiae NOT DETECTED NOT DETECTED Final   Streptococcus pneumoniae NOT DETECTED NOT DETECTED Final   Streptococcus pyogenes NOT DETECTED NOT DETECTED Final   Acinetobacter baumannii NOT DETECTED NOT DETECTED Final   Enterobacteriaceae species NOT DETECTED NOT DETECTED Final   Enterobacter cloacae complex NOT DETECTED NOT DETECTED Final   Escherichia coli NOT DETECTED NOT DETECTED Final   Klebsiella  oxytoca NOT DETECTED NOT DETECTED Final   Klebsiella pneumoniae NOT DETECTED NOT DETECTED Final   Proteus species NOT DETECTED NOT DETECTED Final   Serratia marcescens NOT DETECTED NOT DETECTED Final   Haemophilus influenzae NOT DETECTED NOT DETECTED Final   Neisseria meningitidis NOT DETECTED NOT DETECTED Final   Pseudomonas aeruginosa NOT DETECTED NOT DETECTED Final   Candida albicans NOT DETECTED NOT DETECTED  Final   Candida glabrata NOT DETECTED NOT DETECTED Final   Candida krusei NOT DETECTED NOT DETECTED Final   Candida parapsilosis NOT DETECTED NOT DETECTED Final   Candida tropicalis NOT DETECTED NOT DETECTED Final    Comment: Performed at [facility], [place] [address], [place], [place] [address]  Culture, Urine     Status: Abnormal   Collection Time: 12/25/18  5:00 PM  Result Value Ref Range Status   Specimen Description URINE, CLEAN CATCH  Final   Special Requests   Final    NONE Performed at [facility], [place] [address] [place], [place] [address]    Culture >=100,000 COLONIES/mL STAPHYLOCOCCUS EPIDERMIDIS (A)  Final   Report Status 12/27/2018 FINAL  Final   Organism ID, Bacteria STAPHYLOCOCCUS EPIDERMIDIS (A)  Final      Susceptibility   Staphylococcus epidermidis - MIC*    CIPROFLOXACIN >=8 RESISTANT Resistant     GENTAMICIN <=0.5 SENSITIVE Sensitive     NITROFURANTOIN <=16 SENSITIVE Sensitive     OXACILLIN >=4 RESISTANT Resistant     TETRACYCLINE <=1 SENSITIVE Sensitive     VANCOMYCIN 2 SENSITIVE Sensitive     TRIMETH/SULFA <=10 SENSITIVE Sensitive     CLINDAMYCIN <=0.25 SENSITIVE Sensitive     RIFAMPIN <=0.5 SENSITIVE Sensitive     Inducible Clindamycin NEGATIVE Sensitive     * >=100,000 COLONIES/mL STAPHYLOCOCCUS EPIDERMIDIS     ASSESSMENT & PLAN:   1. MSSA Bacteremia = likely related to central line. Will need line holiday to confidently clear his infection. TTE negative for IE and only mild MV regurg and trivial TV regurg. Unable to do TEE at this  time. Repeat BCx negative at < 24h. No other signs of metastatic infection   2. AKI with Acute HD need = urine output picking up. Hopeful that his HD cath does not need to be replaced. Nephrology following. Adjust abx intervals as indicated with renal recovery.   3. ?Early Cirrhosis = hep b antigen negative. Will repeat hep c Ab (last negative 5 yrs ago).   Janene Madeira, MSN, NP-C [facility] for Infectious [place] Cell: [phone] Pager: [phone]  12/27/2018  2:00 PM

## 2018-12-27 NOTE — Progress Notes (Signed)
Patient ID: James [name], male   DOB: 1976/09/03, 43 y.o.   MRN: [id number] [facility] Progress Note   Assessment/ Plan:   1. Acute kidney Injury: Anuric overnight.  Etiology suspected to be pigment nephropathy from rhabdomyolysis +/- ATN of sepsis. Plan for HD today and thereafter removal of HD catheter for holiday (MSSA bacteremia source control).  2.  Hyperkalemia: corrected with hemodialysis. 3.  Bipolar disorder/anxiety: Ongoing management per primary service. 4.  History of alcohol abuse: We will continue to monitor closely for withdrawal symptoms, initial ultrasound suggestive of early development of cirrhosis (suspect fatty liver has a contributory effect). 5.  Hyponatremia: Secondary to acute kidney injury and unrestricted oral fluid intake-reeducated on fluid restriction. 6. MSSA bacteremia:  On Ancef and will plan for catheter holiday.   Subjective:   Reports to be feeling fair- denies chest pain or shortness of breath.   Objective:   BP 132/76 (BP Location: Right Arm)   Pulse 71   Temp 98.9 F (37.2 C) (Oral)   Resp 16   Ht 6\' 3"  (1.905 m)   Wt 123.2 kg   SpO2 94%   BMI 33.95 kg/m   Intake/Output Summary (Last 24 hours) at 12/27/2018 1014 Last data filed at 12/27/2018 0900 Gross per 24 hour  Intake 664 ml  Output -3850 ml  Net 4514 ml   Weight change: 0.1 kg  Physical Exam: Gen: Appears to be comfortable resting in bed CVS: Pulse regular rhythm, normal rate, S1 and S2 normal Resp: Anteriorly clear to auscultation, no rales. RIJ temp HD catheter Abd: Soft, obese, nontender Ext: 2+ edema over upper and lower extremities/anasarca.  Imaging: No results found.  Labs: BMET Recent Labs  Lab 12/21/18 0229 12/22/18 0457 12/23/18 0213 12/24/18 0418 12/26/18 0347 12/27/18 0505  NA 131* 131* 132* 129* 127* 129*  K 3.9 5.4* 4.0 4.0 5.7* 4.6  CL 104 100 97* 92* 91* 92*  CO2 20* 20* 24 23 23 26   GLUCOSE 85 86 87 82 98 88  BUN 40* 34* 28*  38* 46* 41*  CREATININE 5.54* 5.54* 5.02* 6.96* 7.80* 6.26*  CALCIUM 7.6* 7.9* 8.1* 8.5* 8.5* 8.2*   CBC Recent Labs  Lab 12/23/18 0213 12/24/18 0418 12/26/18 0347 12/27/18 0505  WBC 7.0 6.8 10.5 9.0  NEUTROABS  --   --  9.0* 6.9  HGB 9.4* 9.8* 9.0* 8.2*  HCT 27.8* 28.7* 26.6* 24.6*  MCV 90.0 92.3 92.7 93.9  PLT 208 239 253 242    Medications:    . amLODipine  10 mg Oral Daily  . Chlorhexidine Gluconate Cloth  6 each Topical Q0600  . feeding supplement (ENSURE ENLIVE)  237 mL Oral BID BM  . FLUoxetine  40 mg Oral Daily  . folic acid  1 mg Oral Daily  . heparin  5,000 Units Subcutaneous Q8H  . insulin aspart  0-5 Units Subcutaneous QHS  . insulin aspart  0-9 Units Subcutaneous TID WC  . loxapine  25 mg Oral Daily  . loxapine  50 mg Oral QHS  . metoprolol tartrate  25 mg Oral BID  . polyethylene glycol  17 g Oral Daily  . risperiDONE  9 mg Oral QHS  . senna-docusate  1 tablet Oral BID  . thiamine  100 mg Oral Daily  . topiramate  250 mg Oral QHS  . traZODone  300 mg Oral QHS   Elmarie Shiley, MD 12/27/2018, 10:14 AM

## 2018-12-27 NOTE — Procedures (Signed)
Patient seen on Hemodialysis. BP 132/76 (BP Location: Right Arm)   Pulse 71   Temp 98.9 F (37.2 C) (Oral)   Resp 16   Ht 6\' 3"  (1.905 m)   Wt 123.2 kg   SpO2 94%   BMI 33.95 kg/m   QB 400, UF goal 3L Tolerating treatment without complaints at this time.   Elmarie Shiley MD [facility]. Office # [phone] Pager # [phone] 2:05 PM

## 2018-12-27 NOTE — Progress Notes (Addendum)
Patient ID: James [name], male   DOB: 1976/06/18, 43 y.o.   MRN: [id number]  PROGRESS NOTE    James [name]  WUJ:[id number] DOB: [birth year] DOA: 12/19/2018 PCP: [place], Betty G, MD   Brief Narrative:  43 year old male with history of hypertension, diabetes mellitus type 2, bipolar disorder presented on 12/17/2018 to [facility] after being found down for 2 days following binge alcohol drinking.  He was found to have AKI, metabolic acidosis, leukocytosis and LFT elevation with rhabdomyolysis, CK of 58,000.  He was treated with aggressive IV fluids but unfortunately his renal function got worse with decreased urine output and CK rose to 130,000.  No improvement with IV diuresis.  He was transferred to [facility] on 12/19/2018.  Nephrology was consulted.  Tunneled HD catheter was inserted and hemodialysis was started on 12/20/2018.  Assessment & Plan:   Principal Problem:   MSSA bacteremia Active Problems:   Diabetes mellitus ([place])   Bipolar affective disorder ([place])   ARF (acute renal failure) ([place])   Alcoholism ([place])   AKI (acute kidney injury) ([place])   Hemodialysis catheter infection ([place])  Staph epidermidis bacteremia causing fever -Blood cultures from 12/25/2018 growing staph epidermidis.  Will repeat blood cultures on 12/27/2018.  -ID following.  Currently on Ancef. -Repeat blood cultures for today. -2D echo negative for any vegetations.  -Still having low-grade temperatures.  Temperature max of 100.1 over the last 24 hours -Might have to consider removing the dialysis catheter.  Acute renal failure due to rhabdomyolysis -Renal ultrasound was unremarkable -Nephrology following.  Tunneled HD catheter was inserted and hemodialysis was started on 12/20/2018.  Follow nephrology recommendations.  Patient had dialysis again on 12/26/2018.  Urine output very poor -Repeat a.m. labs.  CK levels much improved.  Hypertension-blood pressure better.  Continue amlodipine.  IV  hydralazine as needed  Alcohol abuse -Monitor for withdrawal.  Currently no signs of withdrawal.  Currently on tapering doses of Librium.  DC Librium today.  Patient was counseled regarding alcohol cessation  Elevated LFTs -From rhabdomyolysis and alcohol abuse.  Hepatitis panel negative, HIV nonreactive -Improving.  Monitor.  Right upper quadrant ultrasound demonstrates early cirrhotic changes  Hyponatremia -Secondary to acute kidney injury and volume overloaded status.  Monitor  Diabetes mellitus type 2 -Continue CBGs with exercise.  Home oral meds on hold  Elevated troponin -Due to muscle breakdown and impaired clearance.  No further interventions planned  Maculopapular rash -Improving  Obesity -Outpatient follow-up  Bipolar disorder/anxiety -continue current regimen.  Outpatient follow-up with psychiatry   DVT prophylaxis: Heparin Code Status: Full Family Communication: None at bedside Disposition Plan: Depends on clinical outcome Consultants: Nephrology  Procedures: Tunneled HD catheter insertion on 12/20/2018 Echo on 12/26/2018 IMPRESSIONS    1. The left ventricle has hyperdynamic systolic function, with an ejection fraction of >65%. The cavity size was mildly dilated. There is mild concentric left ventricular hypertrophy. Left ventricular diastolic parameters were normal.  2. The right ventricle has normal systolic function. The cavity was normal. There is no increase in right ventricular wall thickness.  3. Right atrial size was mildly dilated.  SUMMARY   There is no evidence of vegetation on the current study.  Antimicrobials: Ancef from 12/25/2018 onwards  Subjective: Patient seen and examined at bedside.  He is sleepy, wakes up slightly and answers some questions.  Poor historian.  Denies worsening abdominal pain.  Complains of constipation.  No worsening shortness of breath or cough. Objective: Vitals:   12/26/18 1150 12/26/18 1336  12/26/18 2220  12/27/18 0520  BP: (!) 155/80 (!) 151/88 135/76 132/76  Pulse: 80 93 73 71  Resp: 18 (!) 21 14 16   Temp: 98.2 F (36.8 C) 99.4 F (37.4 C) 100.1 F (37.8 C) 98.9 F (37.2 C)  TempSrc: Oral Oral Oral Oral  SpO2: 98% 95% 91% 94%  Weight: 124 kg   123.2 kg  Height:        Intake/Output Summary (Last 24 hours) at 12/27/2018 0948 Last data filed at 12/27/2018 0900 Gross per 24 hour  Intake 664 ml  Output -3850 ml  Net 4514 ml   Filed Weights   12/26/18 0735 12/26/18 1150 12/27/18 0520  Weight: 128.4 kg 124 kg 123.2 kg    Examination:  General exam: Appears calm and comfortable.  No acute distress.  Poor historian.  Looks older than stated age Respiratory system: Bilateral decreased breath sounds at bases, some crackles.  No wheezing Cardiovascular system: S1-S2 heard, rate controlled Gastrointestinal system: Abdomen is distended, soft and nontender. Normal bowel sounds heard. Extremities: No cyanosis; lower extremity 2+ edema present   Data Reviewed: I have personally reviewed following labs and imaging studies  CBC: Recent Labs  Lab 12/22/18 0457 12/23/18 0213 12/24/18 0418 12/26/18 0347 12/27/18 0505  WBC 7.0 7.0 6.8 10.5 9.0  NEUTROABS  --   --   --  9.0* 6.9  HGB 10.2* 9.4* 9.8* 9.0* 8.2*  HCT 29.6* 27.8* 28.7* 26.6* 24.6*  MCV 92.2 90.0 92.3 92.7 93.9  PLT 121* 208 239 253 540   Basic Metabolic Panel: Recent Labs  Lab 12/22/18 0457 12/23/18 0213 12/24/18 0418 12/26/18 0347 12/27/18 0505  NA 131* 132* 129* 127* 129*  K 5.4* 4.0 4.0 5.7* 4.6  CL 100 97* 92* 91* 92*  CO2 20* 24 23 23 26   GLUCOSE 86 87 82 98 88  BUN 34* 28* 38* 46* 41*  CREATININE 5.54* 5.02* 6.96* 7.80* 6.26*  CALCIUM 7.9* 8.1* 8.5* 8.5* 8.2*  MG  --  1.7 1.8 1.9 2.1   GFR: Estimated Creatinine Clearance: 21.1 mL/min (A) (by C-G formula based on SCr of 6.26 mg/dL (H)). Liver Function Tests: Recent Labs  Lab 12/21/18 0229 12/22/18 0457 12/23/18 0213 12/24/18 0418  AST 531*  382* 299* 196*  ALT 146* 131* 116* 96*  ALKPHOS 43 57 54 48  BILITOT 1.0 2.0* 0.4 0.4  PROT 4.4* 4.3* 4.7* 4.6*  ALBUMIN 2.1* 2.1* 2.1* 2.0*   No results for input(s): LIPASE, AMYLASE in the last 168 hours. No results for input(s): AMMONIA in the last 168 hours. Coagulation Profile: No results for input(s): INR, PROTIME in the last 168 hours. Cardiac Enzymes: Recent Labs  Lab 12/21/18 0229 12/24/18 0418 12/26/18 0347  CKTOTAL 28,554* 9,811* 3,103*   BNP (last 3 results) No results for input(s): PROBNP in the last 8760 hours. HbA1C: No results for input(s): HGBA1C in the last 72 hours. CBG: Recent Labs  Lab 12/26/18 0653 12/26/18 1244 12/26/18 1722 12/26/18 2217 12/27/18 0818  GLUCAP 94 81 106* 95 93   Lipid Profile: No results for input(s): CHOL, HDL, LDLCALC, TRIG, CHOLHDL, LDLDIRECT in the last 72 hours. Thyroid Function Tests: No results for input(s): TSH, T4TOTAL, FREET4, T3FREE, THYROIDAB in the last 72 hours. Anemia Panel: No results for input(s): VITAMINB12, FOLATE, FERRITIN, TIBC, IRON, RETICCTPCT in the last 72 hours. Sepsis Labs: No results for input(s): PROCALCITON, LATICACIDVEN in the last 168 hours.  Recent Results (from the past 240 hour(s))  Culture, blood (routine x 2)  Status: Abnormal (Preliminary result)   Collection Time: 12/25/18 10:00 AM  Result Value Ref Range Status   Specimen Description BLOOD LEFT HAND  Final   Special Requests AEROBIC BOTTLE ONLY Blood Culture adequate volume  Final   Culture  Setup Time   Final    GRAM POSITIVE COCCI IN CLUSTERS AEROBIC BOTTLE ONLY CRITICAL VALUE NOTED.  VALUE IS CONSISTENT WITH PREVIOUSLY REPORTED AND CALLED VALUE.    Culture (A)  Final    STAPHYLOCOCCUS AUREUS SUSCEPTIBILITIES TO FOLLOW Performed at [facility], [place] [address] [place], [place] [address]    Report Status PENDING  Incomplete  Culture, blood (routine x 2)     Status: Abnormal (Preliminary result)   Collection Time:  12/25/18 10:04 AM  Result Value Ref Range Status   Specimen Description BLOOD LEFT HAND  Final   Special Requests AEROBIC BOTTLE ONLY Blood Culture adequate volume  Final   Culture  Setup Time   Final    GRAM POSITIVE COCCI IN CLUSTERS AEROBIC BOTTLE ONLY CRITICAL RESULT CALLED TO, READ BACK BY AND VERIFIED WITH: G. ABBOTT,PHARMD [id number] 12/26/2018 T. TYSOR    Culture (A)  Final    STAPHYLOCOCCUS AUREUS SUSCEPTIBILITIES TO FOLLOW Performed at [facility], [place] [address], [place], [place] [address]    Report Status PENDING  Incomplete  Blood Culture ID Panel (Reflexed)     Status: Abnormal   Collection Time: 12/25/18 10:04 AM  Result Value Ref Range Status   Enterococcus species NOT DETECTED NOT DETECTED Final   Listeria monocytogenes NOT DETECTED NOT DETECTED Final   Staphylococcus species DETECTED (A) NOT DETECTED Final    Comment: CRITICAL RESULT CALLED TO, READ BACK BY AND VERIFIED WITH: G. ABBOTT,PHARMD [id number] 12/26/2018 T. TYSOR    Staphylococcus aureus (BCID) DETECTED (A) NOT DETECTED Final    Comment: Methicillin (oxacillin) susceptible Staphylococcus aureus (MSSA). Preferred therapy is anti staphylococcal beta lactam antibiotic (Cefazolin or Nafcillin), unless clinically contraindicated. CRITICAL RESULT CALLED TO, READ BACK BY AND VERIFIED WITH: G. ABBOTT,PHARMD [id number] 12/26/2018 T. TYSOR    Methicillin resistance NOT DETECTED NOT DETECTED Final   Streptococcus species NOT DETECTED NOT DETECTED Final   Streptococcus agalactiae NOT DETECTED NOT DETECTED Final   Streptococcus pneumoniae NOT DETECTED NOT DETECTED Final   Streptococcus pyogenes NOT DETECTED NOT DETECTED Final   Acinetobacter baumannii NOT DETECTED NOT DETECTED Final   Enterobacteriaceae species NOT DETECTED NOT DETECTED Final   Enterobacter cloacae complex NOT DETECTED NOT DETECTED Final   Escherichia coli NOT DETECTED NOT DETECTED Final   Klebsiella oxytoca NOT DETECTED NOT DETECTED Final   Klebsiella  pneumoniae NOT DETECTED NOT DETECTED Final   Proteus species NOT DETECTED NOT DETECTED Final   Serratia marcescens NOT DETECTED NOT DETECTED Final   Haemophilus influenzae NOT DETECTED NOT DETECTED Final   Neisseria meningitidis NOT DETECTED NOT DETECTED Final   Pseudomonas aeruginosa NOT DETECTED NOT DETECTED Final   Candida albicans NOT DETECTED NOT DETECTED Final   Candida glabrata NOT DETECTED NOT DETECTED Final   Candida krusei NOT DETECTED NOT DETECTED Final   Candida parapsilosis NOT DETECTED NOT DETECTED Final   Candida tropicalis NOT DETECTED NOT DETECTED Final    Comment: Performed at [facility], [place]. [address], [place], [place] [address]  Culture, Urine     Status: Abnormal   Collection Time: 12/25/18  5:00 PM  Result Value Ref Range Status   Specimen Description URINE, CLEAN CATCH  Final   Special Requests  Final    NONE Performed at [facility], [place] [address] [place], [place] [address]    Culture >=100,000 COLONIES/mL STAPHYLOCOCCUS EPIDERMIDIS (A)  Final   Report Status 12/27/2018 FINAL  Final   Organism ID, Bacteria STAPHYLOCOCCUS EPIDERMIDIS (A)  Final      Susceptibility   Staphylococcus epidermidis - MIC*    CIPROFLOXACIN >=8 RESISTANT Resistant     GENTAMICIN <=0.5 SENSITIVE Sensitive     NITROFURANTOIN <=16 SENSITIVE Sensitive     OXACILLIN >=4 RESISTANT Resistant     TETRACYCLINE <=1 SENSITIVE Sensitive     VANCOMYCIN 2 SENSITIVE Sensitive     TRIMETH/SULFA <=10 SENSITIVE Sensitive     CLINDAMYCIN <=0.25 SENSITIVE Sensitive     RIFAMPIN <=0.5 SENSITIVE Sensitive     Inducible Clindamycin NEGATIVE Sensitive     * >=100,000 COLONIES/mL STAPHYLOCOCCUS EPIDERMIDIS         Radiology Studies: No results found.      Scheduled Meds: . amLODipine  10 mg Oral Daily  . chlordiazePOXIDE  5 mg Oral BID  . Chlorhexidine Gluconate Cloth  6 each Topical Q0600  . feeding supplement (ENSURE ENLIVE)  237 mL Oral BID BM  . FLUoxetine  40 mg  Oral Daily  . folic acid  1 mg Oral Daily  . heparin  5,000 Units Subcutaneous Q8H  . insulin aspart  0-5 Units Subcutaneous QHS  . insulin aspart  0-9 Units Subcutaneous TID WC  . loxapine  25 mg Oral Daily  . loxapine  50 mg Oral QHS  . metoprolol tartrate  25 mg Oral BID  . polyethylene glycol  17 g Oral Daily  . risperiDONE  9 mg Oral QHS  . thiamine  100 mg Oral Daily  . topiramate  250 mg Oral QHS  . traZODone  300 mg Oral QHS   Continuous Infusions: . sodium chloride    . sodium chloride    . sodium chloride Stopped (12/26/18 0534)     LOS: 8 days        Aline August, MD [facility] 12/27/2018, 9:48 AM

## 2018-12-28 DIAGNOSIS — I340 Nonrheumatic mitral (valve) insufficiency: Secondary | ICD-10-CM

## 2018-12-28 DIAGNOSIS — T796XXD Traumatic ischemia of muscle, subsequent encounter: Secondary | ICD-10-CM

## 2018-12-28 DIAGNOSIS — N170 Acute kidney failure with tubular necrosis: Principal | ICD-10-CM

## 2018-12-28 DIAGNOSIS — F312 Bipolar disorder, current episode manic severe with psychotic features: Secondary | ICD-10-CM

## 2018-12-28 LAB — COMPREHENSIVE METABOLIC PANEL
ALT: 34 U/L (ref 0–44)
AST: 73 U/L — ABNORMAL HIGH (ref 15–41)
Albumin: 2 g/dL — ABNORMAL LOW (ref 3.5–5.0)
Alkaline Phosphatase: 87 U/L (ref 38–126)
Anion gap: 10 (ref 5–15)
BUN: 31 mg/dL — ABNORMAL HIGH (ref 6–20)
CO2: 27 mmol/L (ref 22–32)
Calcium: 8.1 mg/dL — ABNORMAL LOW (ref 8.9–10.3)
Chloride: 95 mmol/L — ABNORMAL LOW (ref 98–111)
Creatinine, Ser: 5.07 mg/dL — ABNORMAL HIGH (ref 0.61–1.24)
GFR calc Af Amer: 15 mL/min — ABNORMAL LOW (ref >60)
GFR calc non Af Amer: 13 mL/min — ABNORMAL LOW (ref >60)
Glucose, Bld: 88 mg/dL (ref 70–99)
Potassium: 4.3 mmol/L (ref 3.5–5.1)
Sodium: 132 mmol/L — ABNORMAL LOW (ref 135–145)
Total Bilirubin: 0.5 mg/dL (ref 0.3–1.2)
Total Protein: 5 g/dL — ABNORMAL LOW (ref 6.5–8.1)

## 2018-12-28 LAB — CULTURE, BLOOD (ROUTINE X 2)
Special Requests: ADEQUATE
Special Requests: ADEQUATE

## 2018-12-28 LAB — CBC WITH DIFFERENTIAL/PLATELET
Abs Immature Granulocytes: 0.05 10*3/uL (ref 0.00–0.07)
Basophils Absolute: 0 10*3/uL (ref 0.0–0.1)
Basophils Relative: 0 %
Eosinophils Absolute: 0.2 10*3/uL (ref 0.0–0.5)
Eosinophils Relative: 3 %
HCT: 25 % — ABNORMAL LOW (ref 39.0–52.0)
Hemoglobin: 8.5 g/dL — ABNORMAL LOW (ref 13.0–17.0)
Immature Granulocytes: 1 %
Lymphocytes Relative: 14 %
Lymphs Abs: 1.1 10*3/uL (ref 0.7–4.0)
MCH: 31.1 pg (ref 26.0–34.0)
MCHC: 34 g/dL (ref 30.0–36.0)
MCV: 91.6 fL (ref 80.0–100.0)
Monocytes Absolute: 0.8 10*3/uL (ref 0.1–1.0)
Monocytes Relative: 10 %
Neutro Abs: 5.7 10*3/uL (ref 1.7–7.7)
Neutrophils Relative %: 72 %
Platelets: 272 10*3/uL (ref 150–400)
RBC: 2.73 MIL/uL — ABNORMAL LOW (ref 4.22–5.81)
RDW: 12.5 % (ref 11.5–15.5)
WBC: 8 10*3/uL (ref 4.0–10.5)
nRBC: 0 % (ref 0.0–0.2)

## 2018-12-28 LAB — CK: Total CK: 1446 U/L — ABNORMAL HIGH (ref 49–397)

## 2018-12-28 LAB — GLUCOSE, CAPILLARY
Glucose-Capillary: 79 mg/dL (ref 70–99)
Glucose-Capillary: 79 mg/dL (ref 70–99)
Glucose-Capillary: 87 mg/dL (ref 70–99)
Glucose-Capillary: 80 mg/dL (ref 70–99)

## 2018-12-28 LAB — MAGNESIUM: Magnesium: 2 mg/dL (ref 1.7–2.4)

## 2018-12-28 MED ORDER — TOPIRAMATE 25 MG PO TABS: 250.0000 mg | ORAL_TABLET | Freq: Every day | ORAL | Status: DC

## 2018-12-28 NOTE — Progress Notes (Signed)
Patient ID: James [name], male   DOB: 1975/12/03, 43 y.o.   MRN: [id number] [facility] Progress Note   Assessment/ Plan:   1. Acute kidney Injury: Anuric overnight.  Etiology suspected to be pigment nephropathy from rhabdomyolysis +/- ATN of sepsis.  Underwent hemodialysis on Monday and Tuesday followed by removal of dialysis catheter to assist with MSSA management.  He does not have any indication for dialysis today and we will continue to follow daily labs to assess for triggers. 2.  Volume overload: Improving with hemodialysis, reminded need for restricting fluid intake. 3.  Bipolar disorder/anxiety: Ongoing management per primary service. 4.  History of alcohol abuse: We will continue to monitor closely for withdrawal symptoms, initial ultrasound suggestive of early development of cirrhosis (suspect fatty liver has a contributory effect). 5.  Hyponatremia: Secondary to acute kidney injury and unrestricted oral fluid intake-reeducated on fluid restriction. 6. MSSA bacteremia: On intravenous cefazolin and hemodialysis catheter removed yesterday to provide "catheter holiday" and better success at clearing bacteremia.  TTE did not show obvious vegetation. Subjective:   Reports to be feeling fair-denies chest pain or shortness of breath.  We had a discussion regarding fluid restriction.   Objective:   BP (!) 152/76 (BP Location: Right Arm)   Pulse (!) 59   Temp 99 F (37.2 C)   Resp 14   Ht 6\' 3"  (1.905 m)   Wt 119.5 kg   SpO2 93%   BMI 32.93 kg/m   Intake/Output Summary (Last 24 hours) at 12/28/2018 1106 Last data filed at 12/28/2018 0900 Gross per 24 hour  Intake 170 ml  Output 3000 ml  Net -2830 ml   Weight change: -6.3 kg  Physical Exam: Gen: Ambulating around the room with physical therapy CVS: Pulse regular rhythm, normal rate, S1 and S2 normal Resp: Anteriorly clear to auscultation, no rales.  Abd: Soft, obese, nontender Ext: 1-2+  anasarca.  Imaging: No results found.  Labs: BMET Recent Labs  Lab 12/22/18 0457 12/23/18 0213 12/24/18 0418 12/26/18 0347 12/27/18 0505 12/28/18 0307  NA 131* 132* 129* 127* 129* 132*  K 5.4* 4.0 4.0 5.7* 4.6 4.3  CL 100 97* 92* 91* 92* 95*  CO2 20* 24 23 23 26 27   GLUCOSE 86 87 82 98 88 88  BUN 34* 28* 38* 46* 41* 31*  CREATININE 5.54* 5.02* 6.96* 7.80* 6.26* 5.07*  CALCIUM 7.9* 8.1* 8.5* 8.5* 8.2* 8.1*   CBC Recent Labs  Lab 12/24/18 0418 12/26/18 0347 12/27/18 0505 12/28/18 0307  WBC 6.8 10.5 9.0 8.0  NEUTROABS  --  9.0* 6.9 5.7  HGB 9.8* 9.0* 8.2* 8.5*  HCT 28.7* 26.6* 24.6* 25.0*  MCV 92.3 92.7 93.9 91.6  PLT 239 253 242 272    Medications:    . amLODipine  10 mg Oral Daily  . feeding supplement (ENSURE ENLIVE)  237 mL Oral BID BM  . FLUoxetine  40 mg Oral Daily  . folic acid  1 mg Oral Daily  . heparin  5,000 Units Subcutaneous Q8H  . insulin aspart  0-5 Units Subcutaneous QHS  . insulin aspart  0-9 Units Subcutaneous TID WC  . loxapine  25 mg Oral Daily  . loxapine  50 mg Oral QHS  . metoprolol tartrate  25 mg Oral BID  . polyethylene glycol  17 g Oral Daily  . risperiDONE  9 mg Oral QHS  . senna-docusate  1 tablet Oral BID  . thiamine  100 mg Oral Daily  . topiramate  250 mg Oral QHS  . traZODone  300 mg Oral QHS   Elmarie Shiley, MD 12/28/2018, 11:06 AM

## 2018-12-28 NOTE — Progress Notes (Signed)
On assessment found 17mm circular wound on pt's forearm, open to air. Pink around wound, yellow and red bed, no noticeable drainage. Pt reported "that's where an IV went bad". No swelling noted. I cleansed wound and placed dry guaze dressing over wound.

## 2018-12-28 NOTE — Progress Notes (Signed)
Occupational Therapy Treatment Patient Details Name: James James [name] MRN: [id number] DOB: July 26, 1976 Today's Date: 12/28/2018    History of present illness Pt is a 43 y/o male admitted after fall. Found to have acute renal failure secondary to rhabdomyolysis. Pt is s/p tunneled HD catheter and started HD on 4/7. PMH includes DM, bipolar disorder, HTN, and alcohol abuse.    OT comments  Pt performing ADL functional mobility with RW  With  minguardA and ADL tasks with set-upA at sink for grooming. Pt performing ADL tasks overall with minA. Pt stood at sink for hair washing technique. Pt ambulating in room and progressing well. Pt would greatly from OT skilled services for ADL and mobility. OT to follow acutely.   Follow Up Recommendations  Home health OT;Supervision - Intermittent    Equipment Recommendations  None recommended by OT    Recommendations for Other Services      Precautions / Restrictions Precautions Precautions: Fall Restrictions Weight Bearing Restrictions: No       Mobility Bed Mobility Overal bed mobility: Needs Assistance             General bed mobility comments: OOB in recliner  Transfers Overall transfer level: Needs assistance Equipment used: Rolling walker (2 wheeled) Transfers: Sit to/from Stand Sit to Stand: Min guard         General transfer comment: Min guard for balance support and safety.     Balance Overall balance assessment: Needs assistance Sitting-balance support: No upper extremity supported;Feet supported Sitting balance-Leahy Scale: Good     James [name] balance support: Bilateral upper extremity supported;During functional activity James [name] balance-Leahy Scale: Poor James [name] balance comment: walker and supervision for static James [name]                           ADL either performed or assessed with clinical judgement   ADL Overall ADL's : Needs assistance/impaired     Grooming: Set up;James [name]                                Functional mobility during ADLs: Min guard;Rolling walker General ADL Comments: pt limited secondary to reports of increased pain and swelling limiting his participation.     Vision   Vision Assessment?: No apparent visual deficits   Perception     Praxis      Cognition Arousal/Alertness: Awake/alert Behavior During Therapy: Flat affect Overall Cognitive Status: Within Functional Limits for tasks assessed                                 General Comments: increased time for processing        Exercises     Shoulder Instructions       General Comments      Pertinent Vitals/ Pain       Pain Assessment: No/denies pain  Home Living                                          Prior Functioning/Environment              Frequency  Min 3X/week        Progress Toward Goals  OT Goals(current goals can now be found in the care plan section)  Progress  towards OT goals: Progressing toward goals  Acute Rehab OT Goals Patient Stated Goal: to get better OT Goal Formulation: With patient Time For Goal Achievement: 01/07/19 Potential to Achieve Goals: Good ADL Goals Pt Will Perform Grooming: Independently Pt Will Perform Upper Body Dressing: Independently Pt Will Perform Lower Body Dressing: Independently Pt Will Transfer to Toilet: Independently;ambulating  Plan Discharge plan remains appropriate    Co-evaluation                 AM-PAC OT "6 Clicks" Daily Activity     Outcome Measure   Help from another person eating meals?: None Help from another person taking care of personal grooming?: A Little Help from another person toileting, which includes using toliet, bedpan, or urinal?: Total Help from another person bathing (including washing, rinsing, drying)?: A Little Help from another person to put on and taking off regular upper body clothing?: A Little Help from another person to put on  and taking off regular lower body clothing?: Total 6 Click Score: 15    End of Session Equipment Utilized During Treatment: Gait belt;Rolling walker  OT Visit Diagnosis: Unsteadiness on feet (R26.81);Other abnormalities of gait and mobility (R26.89)   Activity Tolerance Patient limited by pain   Patient Left in chair;with call bell/phone within reach   Nurse Communication Mobility status        Time: 2841-3244 OT Time Calculation (min): 23 min  Charges: OT Treatments $Self Care/Home Management : 8-22 mins $Therapeutic Activity: 8-22 mins  Darryl Nestle) Marsa Aris OTR/L Acute Rehabilitation Services Pager: [phone] Office: [phone]    Audie Pinto 12/28/2018, 5:03 PM

## 2018-12-28 NOTE — Progress Notes (Signed)
Physical Therapy Treatment Patient Details Name: James [name] MRN: [id number] DOB: December 02, 1975 Today's Date: 12/28/2018    History of Present Illness Pt is a 43 y/o male admitted after fall. Found to have acute renal failure secondary to rhabdomyolysis. Pt is s/p tunneled HD catheter and started HD on 4/7. PMH includes DM, bipolar disorder, HTN, and alcohol abuse.     PT Comments    Pt progressing towards physical therapy goals. Goal of session was stair training, however pt stated "Oh I'm not ready for all that yet." After extended gait training session, pt agreeable to attempt stair training at next session. Self-limiting at times but overall gave a good rehab effort. Will continue to follow.    Follow Up Recommendations  Home health PT;Supervision for mobility/OOB     Equipment Recommendations  Rolling walker with 5" wheels    Recommendations for Other Services       Precautions / Restrictions Precautions Precautions: Fall Restrictions Weight Bearing Restrictions: No    Mobility  Bed Mobility Overal bed mobility: Needs Assistance Bed Mobility: Sit to Supine     Supine to sit: Supervision     General bed mobility comments: HOB elevated and rails utilized  Transfers Overall transfer level: Needs assistance Equipment used: Rolling walker (2 wheeled) Transfers: Sit to/from Stand Sit to Stand: Min guard         General transfer comment: Min guard for balance support and safety.   Ambulation/Gait Ambulation/Gait assistance: Supervision Gait Distance (Feet): 450 Feet Assistive device: Rolling walker (2 wheeled) Gait Pattern/deviations: Step-through pattern;Decreased stride length;Wide base of support Gait velocity: Decreased  Gait velocity interpretation: 1.31 - 2.62 ft/sec, indicative of limited community ambulator General Gait Details: Supervision for safety throughout gait training. Pt moving slow but with with improved coordination this session.     Stairs             Wheelchair Mobility    Modified Rankin (Stroke Patients Only)       Balance Overall balance assessment: Needs assistance Sitting-balance support: No upper extremity supported;Feet supported Sitting balance-Leahy Scale: Good     Standing balance support: Bilateral upper extremity supported;During functional activity Standing balance-Leahy Scale: Poor Standing balance comment: walker and supervision for static standing                            Cognition Arousal/Alertness: Awake/alert Behavior During Therapy: Flat affect Overall Cognitive Status: Within Functional Limits for tasks assessed                                 General Comments: increased time for processing      Exercises      General Comments        Pertinent Vitals/Pain Pain Assessment: No/denies pain    Home Living                      Prior Function            PT Goals (current goals can now be found in the care plan section) Acute Rehab PT Goals Patient Stated Goal: to get better PT Goal Formulation: With patient Time For Goal Achievement: 01/06/19 Potential to Achieve Goals: Good Progress towards PT goals: Progressing toward goals    Frequency    Min 3X/week      PT Plan Current plan remains appropriate    Co-evaluation  AM-PAC PT "6 Clicks" Mobility   Outcome Measure  Help needed turning from your back to your side while in a flat bed without using bedrails?: None Help needed moving from lying on your back to sitting on the side of a flat bed without using bedrails?: None Help needed moving to and from a bed to a chair (including a wheelchair)?: A Little Help needed standing up from a chair using your arms (e.g., wheelchair or bedside chair)?: A Little Help needed to walk in hospital room?: A Little Help needed climbing 3-5 steps with a railing? : A Little 6 Click Score: 20    End of Session  Equipment Utilized During Treatment: Gait belt Activity Tolerance: Patient tolerated treatment well Patient left: with call bell/phone within reach;in chair Nurse Communication: Mobility status PT Visit Diagnosis: Other abnormalities of gait and mobility (R26.89);Unsteadiness on feet (R26.81);Muscle weakness (generalized) (M62.81)     Time: 4235-3614 PT Time Calculation (min) (ACUTE ONLY): 17 min  Charges:  $Gait Training: 8-22 mins                     Rolinda Roan, PT, DPT Acute Rehabilitation Services Pager: [phone] Office: [phone]    Thelma Comp 12/28/2018, 1:00 PM

## 2018-12-28 NOTE — Progress Notes (Signed)
PROGRESS NOTE    James [name]  IRS:[id number] DOB: 1975-09-15 DOA: 12/19/2018 PCP: No primary care provider on file.    Brief Narrative:  43 year old male with history of hypertension, diabetes mellitus type 2, bipolar disorder presented on 12/17/2018 to [facility] after being found down for 2 days following binge alcohol drinking.  He was found to have AKI, metabolic acidosis, leukocytosis and LFT elevation with rhabdomyolysis, CK of 58,000.  He was treated with aggressive IV fluids but unfortunately his renal function got worse with decreased urine output and CK rose to 130,000.  No improvement with IV diuresis.  He was transferred to [facility] on 12/19/2018.  Nephrology was consulted.  Tunneled HD catheter was inserted and hemodialysis was started on 12/20/2018.  HD catheter subsequently removed 12/27/2018.  Patient also noted to have a MSSA bacteremia.  ID consulted.   Assessment & Plan:   Principal Problem:   MSSA bacteremia Active Problems:   Diabetes mellitus ([place])   Bipolar affective disorder ([place])   ARF (acute renal failure) ([place])   Alcoholism ([place])   AKI (acute kidney injury) ([place])   Hemodialysis catheter infection ([place])   Traumatic rhabdomyolysis ([place])  1 MSSA bacteremia Questionable etiology.  Concern for possible line infection.  2D echo which was done was negative for infective endocarditis and only mild mitral valvular regurgitation and trivial tricuspid valvular regurgitation.  TEE was unable to be done at this time.  HD catheter line was removed last night 12/27/2018.  Repeat blood cultures have been ordered per ID which are currently pending.  Blood cultures from 12/27/2018 with no growth to date.  ID recommending treating with IV cefazolin x14 days from line removal with day 1 either 12/27/2018 12/28/2018 with treatment through 01/10/2019.  ID following and appreciate input and recommendations.  2.  Acute renal failure Felt likely secondary to  rhabdomyolysis plus or minus ATN.  Patient had a HD catheter line placed and underwent hemodialysis on Monday and Tuesday followed by removal of dialysis catheter due to concerns might be etiology of MSSA bacteremia.  Patient with no noted urine output overnight and underwent hemodialysis yesterday with 3 L removed.  Creatinine currently at 5.07.  Monitor urine output.  Nephrology following and appreciate input and recommendations.  3.  Rhabdomyolysis Improving.  Patient noted to have initial presentation with a CK level of 58,000 and treated aggressively with IV fluids however renal function worsened with decreased urine output and worsening CK of 130,000.  No improvement with diuresis.  Patient subsequently underwent hemodialysis.  CK levels have trended down and currently at 1446.  Follow.  4.  Volume overload Improved with hemodialysis.  HD catheter has been removed.  Monitor for urine output.  Follow.  5.  Alcohol abuse No signs of alcohol withdrawal noted.  Was on the Librium tapering doses which has been discontinued.  Alcohol cessation stressed to patient.  Follow.  6.  Transaminitis Felt secondary to rhabdomyolysis and alcohol use.  Acute hepatitis panel negative.  HIV nonreactive.  Right upper quadrant ultrasound with early cirrhotic changes noted.  LFTs trending down.  Follow.  7.  Hyponatremia Secondary to hypervolemic hyponatremia secondary to acute kidney injury.  Improved with hemodialysis.  Follow.  8.  Diabetes mellitus type 2 Oral medications on hold.  Check a hemoglobin A1c.  Continue sliding scale insulin.  9.  Elevated troponin Secondary to rhabdomyolysis.  No further interventions at this time.  10.  Maculopapular rash Improved.  11.  Obesity  12.  Bipolar disorder/anxiety Stable.  Continue Prozac, Risperdal, Topamax.  Trazodone nightly.  13.  Hypertension Continue Lopressor.   DVT prophylaxis: Heparin Code Status: Full Family Communication: Updated patient.   No family at bedside. Disposition Plan: Likely home with home health when clinically stable, improvement with renal function, when okay with nephrology and ID.   Consultants:   Infectious disease: Dr. Rhina Brackett [place] 12/26/2018  Nephrology: Dr. Hollie Salk 12/19/2018  Procedures:   Chest x-ray 12/25/2018  Renal ultrasound 12/20/2018  Right IJ catheter placement under ultrasound and fluoroscopic guidance per [facility] for 12/20/2018  2D echo 12/26/2018  Antimicrobials:   IV Ancef 12/26/2018>>>>>   Subjective: Sitting upright in chair.  Foley catheter in place.  Denies any chest pain.  No shortness of breath.  Patient states dialysis catheter removed last night.  Objective: Vitals:   12/27/18 1716 12/27/18 2133 12/28/18 0539 12/28/18 0636  BP: (!) 158/87 (!) 157/79 (!) 152/76   Pulse: 74 80 (!) 59   Resp: 18 16 14    Temp: 98.4 F (36.9 C) 99.9 F (37.7 C) 99 F (37.2 C)   TempSrc: Oral Oral    SpO2: 98% 95% 93%   Weight: 119.1 kg   119.5 kg  Height:        Intake/Output Summary (Last 24 hours) at 12/28/2018 1229 Last data filed at 12/28/2018 1117 Gross per 24 hour  Intake 170 ml  Output 3100 ml  Net -2930 ml   Filed Weights   12/27/18 1330 12/27/18 1716 12/28/18 0636  Weight: 122.1 kg 119.1 kg 119.5 kg    Examination:  General exam: Appears calm and comfortable  Respiratory system: Clear to auscultation. Respiratory effort normal. Cardiovascular system: S1 & S2 heard, RRR. No JVD, murmurs, rubs, gallops or clicks. No pedal edema. Gastrointestinal system: Abdomen is nondistended, soft and nontender. No organomegaly or masses felt. Normal bowel sounds heard. Central nervous system: Alert and oriented. No focal neurological deficits. Extremities: Symmetric 5 x 5 power. Skin: No rashes, lesions or ulcers Psychiatry: Judgement and insight appear normal. Mood & affect appropriate.     Data Reviewed: I have personally reviewed following labs and  imaging studies  CBC: Recent Labs  Lab 12/23/18 0213 12/24/18 0418 12/26/18 0347 12/27/18 0505 12/28/18 0307  WBC 7.0 6.8 10.5 9.0 8.0  NEUTROABS  --   --  9.0* 6.9 5.7  HGB 9.4* 9.8* 9.0* 8.2* 8.5*  HCT 27.8* 28.7* 26.6* 24.6* 25.0*  MCV 90.0 92.3 92.7 93.9 91.6  PLT 208 239 253 242 295   Basic Metabolic Panel: Recent Labs  Lab 12/23/18 0213 12/24/18 0418 12/26/18 0347 12/27/18 0505 12/28/18 0307  NA 132* 129* 127* 129* 132*  K 4.0 4.0 5.7* 4.6 4.3  CL 97* 92* 91* 92* 95*  CO2 24 23 23 26 27   GLUCOSE 87 82 98 88 88  BUN 28* 38* 46* 41* 31*  CREATININE 5.02* 6.96* 7.80* 6.26* 5.07*  CALCIUM 8.1* 8.5* 8.5* 8.2* 8.1*  MG 1.7 1.8 1.9 2.1 2.0   GFR: Estimated Creatinine Clearance: 25.6 mL/min (A) (by C-G formula based on SCr of 5.07 mg/dL (H)). Liver Function Tests: Recent Labs  Lab 12/22/18 0457 12/23/18 0213 12/24/18 0418 12/28/18 0307  AST 382* 299* 196* 73*  ALT 131* 116* 96* 34  ALKPHOS 57 54 48 87  BILITOT 2.0* 0.4 0.4 0.5  PROT 4.3* 4.7* 4.6* 5.0*  ALBUMIN 2.1* 2.1* 2.0* 2.0*   No results for input(s): LIPASE, AMYLASE in the last 168 hours. No results  for input(s): AMMONIA in the last 168 hours. Coagulation Profile: No results for input(s): INR, PROTIME in the last 168 hours. Cardiac Enzymes: Recent Labs  Lab 12/24/18 0418 12/26/18 0347 12/28/18 0307  CKTOTAL 8,634* 3,103* 1,446*   BNP (last 3 results) No results for input(s): PROBNP in the last 8760 hours. HbA1C: No results for input(s): HGBA1C in the last 72 hours. CBG: Recent Labs  Lab 12/27/18 1143 12/27/18 1759 12/27/18 2135 12/28/18 0805 12/28/18 1206  GLUCAP 100* 122* 102* 79 79   Lipid Profile: No results for input(s): CHOL, HDL, LDLCALC, TRIG, CHOLHDL, LDLDIRECT in the last 72 hours. Thyroid Function Tests: No results for input(s): TSH, T4TOTAL, FREET4, T3FREE, THYROIDAB in the last 72 hours. Anemia Panel: No results for input(s): VITAMINB12, FOLATE, FERRITIN, TIBC, IRON,  RETICCTPCT in the last 72 hours. Sepsis Labs: No results for input(s): PROCALCITON, LATICACIDVEN in the last 168 hours.  Recent Results (from the past 240 hour(s))  Culture, blood (routine x 2)     Status: Abnormal   Collection Time: 12/25/18 10:00 AM  Result Value Ref Range Status   Specimen Description BLOOD LEFT HAND  Final   Special Requests AEROBIC BOTTLE ONLY Blood Culture adequate volume  Final   Culture  Setup Time   Final    GRAM POSITIVE COCCI IN CLUSTERS AEROBIC BOTTLE ONLY CRITICAL VALUE NOTED.  VALUE IS CONSISTENT WITH PREVIOUSLY REPORTED AND CALLED VALUE.    Culture (A)  Final    STAPHYLOCOCCUS AUREUS SUSCEPTIBILITIES PERFORMED ON PREVIOUS CULTURE WITHIN THE LAST 5 DAYS. Performed at [facility], [place] [address], [place], [place] [address]    Report Status 12/28/2018 FINAL  Final  Culture, blood (routine x 2)     Status: Abnormal   Collection Time: 12/25/18 10:04 AM  Result Value Ref Range Status   Specimen Description BLOOD LEFT HAND  Final   Special Requests AEROBIC BOTTLE ONLY Blood Culture adequate volume  Final   Culture  Setup Time   Final    GRAM POSITIVE COCCI IN CLUSTERS AEROBIC BOTTLE ONLY CRITICAL RESULT CALLED TO, READ BACK BY AND VERIFIED WITH: Salli Real [id number] 12/26/2018 Mena Goes Performed at [facility], [place] [address], [place], [place] [address]    Culture STAPHYLOCOCCUS AUREUS (A)  Final   Report Status 12/28/2018 FINAL  Final   Organism ID, Bacteria STAPHYLOCOCCUS AUREUS  Final      Susceptibility   Staphylococcus aureus - MIC*    CIPROFLOXACIN <=0.5 SENSITIVE Sensitive     ERYTHROMYCIN <=0.25 SENSITIVE Sensitive     GENTAMICIN <=0.5 SENSITIVE Sensitive     OXACILLIN 0.5 SENSITIVE Sensitive     TETRACYCLINE <=1 SENSITIVE Sensitive     VANCOMYCIN <=0.5 SENSITIVE Sensitive     TRIMETH/SULFA <=10 SENSITIVE Sensitive     CLINDAMYCIN <=0.25 SENSITIVE Sensitive     RIFAMPIN <=0.5 SENSITIVE Sensitive     Inducible Clindamycin  NEGATIVE Sensitive     * STAPHYLOCOCCUS AUREUS  Blood Culture ID Panel (Reflexed)     Status: Abnormal   Collection Time: 12/25/18 10:04 AM  Result Value Ref Range Status   Enterococcus species NOT DETECTED NOT DETECTED Final   Listeria monocytogenes NOT DETECTED NOT DETECTED Final   Staphylococcus species DETECTED (A) NOT DETECTED Final    Comment: CRITICAL RESULT CALLED TO, READ BACK BY AND VERIFIED WITH: G. ABBOTT,PHARMD [id number] 12/26/2018 T. TYSOR    Staphylococcus aureus (BCID) DETECTED (A) NOT DETECTED Final    Comment: Methicillin (oxacillin) susceptible Staphylococcus aureus (MSSA).  Preferred therapy is anti staphylococcal beta lactam antibiotic (Cefazolin or Nafcillin), unless clinically contraindicated. CRITICAL RESULT CALLED TO, READ BACK BY AND VERIFIED WITH: G. ABBOTT,PHARMD [id number] 12/26/2018 T. TYSOR    Methicillin resistance NOT DETECTED NOT DETECTED Final   Streptococcus species NOT DETECTED NOT DETECTED Final   Streptococcus agalactiae NOT DETECTED NOT DETECTED Final   Streptococcus pneumoniae NOT DETECTED NOT DETECTED Final   Streptococcus pyogenes NOT DETECTED NOT DETECTED Final   Acinetobacter baumannii NOT DETECTED NOT DETECTED Final   Enterobacteriaceae species NOT DETECTED NOT DETECTED Final   Enterobacter cloacae complex NOT DETECTED NOT DETECTED Final   Escherichia coli NOT DETECTED NOT DETECTED Final   Klebsiella oxytoca NOT DETECTED NOT DETECTED Final   Klebsiella pneumoniae NOT DETECTED NOT DETECTED Final   Proteus species NOT DETECTED NOT DETECTED Final   Serratia marcescens NOT DETECTED NOT DETECTED Final   Haemophilus influenzae NOT DETECTED NOT DETECTED Final   Neisseria meningitidis NOT DETECTED NOT DETECTED Final   Pseudomonas aeruginosa NOT DETECTED NOT DETECTED Final   Candida albicans NOT DETECTED NOT DETECTED Final   Candida glabrata NOT DETECTED NOT DETECTED Final   Candida krusei NOT DETECTED NOT DETECTED Final   Candida parapsilosis NOT DETECTED  NOT DETECTED Final   Candida tropicalis NOT DETECTED NOT DETECTED Final    Comment: Performed at [facility], [place]. [address] [place], [place] [address]  Culture, Urine     Status: Abnormal   Collection Time: 12/25/18  5:00 PM  Result Value Ref Range Status   Specimen Description URINE, CLEAN CATCH  Final   Special Requests   Final    NONE Performed at [facility], [place] [address], [place], [place] [address]    Culture >=100,000 COLONIES/mL STAPHYLOCOCCUS EPIDERMIDIS (A)  Final   Report Status 12/27/2018 FINAL  Final   Organism ID, Bacteria STAPHYLOCOCCUS EPIDERMIDIS (A)  Final      Susceptibility   Staphylococcus epidermidis - MIC*    CIPROFLOXACIN >=8 RESISTANT Resistant     GENTAMICIN <=0.5 SENSITIVE Sensitive     NITROFURANTOIN <=16 SENSITIVE Sensitive     OXACILLIN >=4 RESISTANT Resistant     TETRACYCLINE <=1 SENSITIVE Sensitive     VANCOMYCIN 2 SENSITIVE Sensitive     TRIMETH/SULFA <=10 SENSITIVE Sensitive     CLINDAMYCIN <=0.25 SENSITIVE Sensitive     RIFAMPIN <=0.5 SENSITIVE Sensitive     Inducible Clindamycin NEGATIVE Sensitive     * >=100,000 COLONIES/mL STAPHYLOCOCCUS EPIDERMIDIS  Culture, blood (routine x 2)     Status: None (Preliminary result)   Collection Time: 12/27/18  7:35 AM  Result Value Ref Range Status   Specimen Description BLOOD LEFT HAND  Final   Special Requests   Final    BOTTLES DRAWN AEROBIC ONLY Blood Culture adequate volume   Culture   Final    NO GROWTH 1 DAY Performed at [facility], [place] [address], [place], [place] [address]    Report Status PENDING  Incomplete  Culture, blood (routine x 2)     Status: None (Preliminary result)   Collection Time: 12/27/18  7:37 AM  Result Value Ref Range Status   Specimen Description BLOOD LEFT WRIST  Final   Special Requests   Final    BOTTLES DRAWN AEROBIC ONLY Blood Culture adequate volume   Culture   Final    NO GROWTH 1 DAY Performed at [facility], [place] [address],  [place], [place] [address]    Report Status  PENDING  Incomplete         Radiology Studies: No results found.      Scheduled Meds: . amLODipine  10 mg Oral Daily  . feeding supplement (ENSURE ENLIVE)  237 mL Oral BID BM  . FLUoxetine  40 mg Oral Daily  . folic acid  1 mg Oral Daily  . heparin  5,000 Units Subcutaneous Q8H  . insulin aspart  0-5 Units Subcutaneous QHS  . insulin aspart  0-9 Units Subcutaneous TID WC  . loxapine  25 mg Oral Daily  . loxapine  50 mg Oral QHS  . metoprolol tartrate  25 mg Oral BID  . polyethylene glycol  17 g Oral Daily  . risperiDONE  9 mg Oral QHS  . senna-docusate  1 tablet Oral BID  . thiamine  100 mg Oral Daily  . topiramate  250 mg Oral QHS  . traZODone  300 mg Oral QHS   Continuous Infusions: . sodium chloride    . sodium chloride    . sodium chloride Stopped (12/26/18 0534)  .  ceFAZolin (ANCEF) IV 1 g (12/27/18 2140)     LOS: 9 days    Time spent: 35 minutes    Irine Seal, MD [facility]  If 7PM-7AM, please contact night-coverage [email or web address] 12/28/2018, 12:29 PM

## 2018-12-28 NOTE — Progress Notes (Signed)
I spoke with pt's mother on chart Alric Seton [phone]) and updated her per pt's request. She asked if the nephrologist could also speak to her when they get a chance. I will page them.

## 2018-12-28 NOTE — Progress Notes (Addendum)
[facility] for Infectious Disease  Date of Admission:  12/19/2018     Total days of antibiotics 3 Cefazolin          Patient ID: James [name] is a 43 y.o. male with  Principal Problem:   MSSA bacteremia Active Problems:   Diabetes mellitus ([place])   Bipolar affective disorder ([place])   ARF (acute renal failure) ([place])   Alcoholism ([place])   AKI (acute kidney injury) ([place])   Hemodialysis catheter infection ([place])   Traumatic rhabdomyolysis ([place])   . amLODipine  10 mg Oral Daily  . feeding supplement (ENSURE ENLIVE)  237 mL Oral BID BM  . FLUoxetine  40 mg Oral Daily  . folic acid  1 mg Oral Daily  . heparin  5,000 Units Subcutaneous Q8H  . insulin aspart  0-5 Units Subcutaneous QHS  . insulin aspart  0-9 Units Subcutaneous TID WC  . loxapine  25 mg Oral Daily  . loxapine  50 mg Oral QHS  . metoprolol tartrate  25 mg Oral BID  . polyethylene glycol  17 g Oral Daily  . risperiDONE  9 mg Oral QHS  . senna-docusate  1 tablet Oral BID  . thiamine  100 mg Oral Daily  . topiramate  250 mg Oral QHS  . traZODone  300 mg Oral QHS    SUBJECTIVE: HD line has been removed last night. Feeling better. Not sure how much urine he "should" be making. Denies any drug use outside of alcohol. He openly discusses his alcohol consumption.   Review of Systems: Review of Systems  Constitutional: Positive for malaise/fatigue. Negative for fever.  Respiratory: Negative for shortness of breath.   Cardiovascular: Positive for leg swelling. Negative for chest pain.  Musculoskeletal: Negative for myalgias.  Skin: Negative for rash.  All other systems reviewed and are negative.   No Known Allergies  OBJECTIVE: Vitals:   12/27/18 1716 12/27/18 2133 12/28/18 0539 12/28/18 0636  BP: (!) 158/87 (!) 157/79 (!) 152/76   Pulse: 74 80 (!) 59   Resp: 18 16 14    Temp: 98.4 F (36.9 C) 99.9 F (37.7 C) 99 F (37.2 C)   TempSrc: Oral Oral    SpO2: 98% 95% 93%   Weight: 119.1 kg    119.5 kg  Height:       Body mass index is 32.93 kg/m.   Physical Exam Constitutional:      Comments: Resting comfortably in chair watching TV. No distress.   HENT:     Mouth/Throat:     Mouth: Mucous membranes are moist.     Pharynx: Oropharynx is clear.  Neck:     Comments: Bulky dressing to neck. Clean and dry.  Cardiovascular:     Rate and Rhythm: Normal rate.     Pulses: Normal pulses.     Heart sounds: No murmur.  Pulmonary:     Effort: Pulmonary effort is normal.     Breath sounds: Normal breath sounds.  Skin:    General: Skin is warm and dry.     Capillary Refill: Capillary refill takes less than 2 seconds.     Comments: Multiple areas of scabbed over abrasions as previously documented. Remains generally edematous.   Neurological:     Mental Status: He is oriented to person, place, and time.     Lab Results Lab Results  Component Value Date   WBC 8.0 12/28/2018   HGB 8.5 (L) 12/28/2018  HCT 25.0 (L) 12/28/2018   MCV 91.6 12/28/2018   PLT 272 12/28/2018    Lab Results  Component Value Date   CREATININE 5.07 (H) 12/28/2018   BUN 31 (H) 12/28/2018   NA 132 (L) 12/28/2018   K 4.3 12/28/2018   CL 95 (L) 12/28/2018   CO2 27 12/28/2018    Lab Results  Component Value Date   ALT 34 12/28/2018   AST 73 (H) 12/28/2018   ALKPHOS 87 12/28/2018   BILITOT 0.5 12/28/2018     Microbiology: BCx 4/12 >> MSSA 2/2 sets BCx 4/14 >> pending (line still in) BCx 4/15 >> not yet drawn   ASSESSMENT & PLAN:   1. MSSA Bacteremia = TTE negative for IE and only mild MV regurg and trivial TV regurg. Unable to do TEE at this time. Possible for entry with multiple skin wounds however he was not febrile/bacteremic until well into his hospital stay. Exam otherwise unrevealing for cause - likely related to HD line. Will repeat blood cultures today now that line is removed. No other signs of metastatic infection. Would continue to treat with Cefazolin IV x 14 days from line  removal (through 4/28).   2. AKI with Acute HD Need = urine output picking up. CK improving (1300 today). Hopeful that his HD cath does not need to be replaced. Nephrology is following closely and assessing daily for HD needs. Adjust abx intervals as indicated with renal recovery.   3. ?Early Cirrhosis = hep b antigen negative. Hep c Ab pending. Likely due to fatty liver disease and excessive/frequent alcohol consumption. Discussed at the bedside today.    Janene Madeira, MSN, NP-C [facility] for Infectious [place] Cell: [phone] Pager: [phone]  12/28/2018  11:28 AM

## 2018-12-28 NOTE — Care Management Important Message (Signed)
Important Message  Patient Details  Name: James [name] MRN: [id number] Date of Birth: 1975-10-24   Medicare Important Message Given:  Yes  IM signed on 12/27/2018   [name] 12/28/2018, 9:50 AM

## 2018-12-29 LAB — CBC
HCT: 25.7 % — ABNORMAL LOW (ref 39.0–52.0)
Hemoglobin: 8.5 g/dL — ABNORMAL LOW (ref 13.0–17.0)
MCH: 30.2 pg (ref 26.0–34.0)
MCHC: 33.1 g/dL (ref 30.0–36.0)
MCV: 91.5 fL (ref 80.0–100.0)
Platelets: 291 10*3/uL (ref 150–400)
RBC: 2.81 MIL/uL — ABNORMAL LOW (ref 4.22–5.81)
RDW: 12.2 % (ref 11.5–15.5)
WBC: 6.6 10*3/uL (ref 4.0–10.5)
nRBC: 0 % (ref 0.0–0.2)

## 2018-12-29 LAB — COMPREHENSIVE METABOLIC PANEL
ALT: 17 U/L (ref 0–44)
AST: 49 U/L — ABNORMAL HIGH (ref 15–41)
Albumin: 2 g/dL — ABNORMAL LOW (ref 3.5–5.0)
Alkaline Phosphatase: 73 U/L (ref 38–126)
Anion gap: 12 (ref 5–15)
BUN: 48 mg/dL — ABNORMAL HIGH (ref 6–20)
CO2: 25 mmol/L (ref 22–32)
Calcium: 8.2 mg/dL — ABNORMAL LOW (ref 8.9–10.3)
Chloride: 94 mmol/L — ABNORMAL LOW (ref 98–111)
Creatinine, Ser: 6.82 mg/dL — ABNORMAL HIGH (ref 0.61–1.24)
GFR calc Af Amer: 10 mL/min — ABNORMAL LOW (ref >60)
GFR calc non Af Amer: 9 mL/min — ABNORMAL LOW (ref >60)
Glucose, Bld: 88 mg/dL (ref 70–99)
Potassium: 4.4 mmol/L (ref 3.5–5.1)
Sodium: 131 mmol/L — ABNORMAL LOW (ref 135–145)
Total Bilirubin: 0.3 mg/dL (ref 0.3–1.2)
Total Protein: 4.9 g/dL — ABNORMAL LOW (ref 6.5–8.1)

## 2018-12-29 LAB — HEMOGLOBIN A1C
Hgb A1c MFr Bld: 5.2 % (ref 4.8–5.6)
Mean Plasma Glucose: 102.54 mg/dL

## 2018-12-29 LAB — CK: Total CK: 984 U/L — ABNORMAL HIGH (ref 49–397)

## 2018-12-29 LAB — HEPATITIS C ANTIBODY: HCV Ab: <0.1 s/co ratio (ref 0.0–0.9)

## 2018-12-29 LAB — GLUCOSE, CAPILLARY
Glucose-Capillary: 84 mg/dL (ref 70–99)
Glucose-Capillary: 120 mg/dL — ABNORMAL HIGH (ref 70–99)
Glucose-Capillary: 75 mg/dL (ref 70–99)

## 2018-12-29 NOTE — Progress Notes (Signed)
Physical Therapy Treatment Patient Details Name: James [name] MRN: [id number] DOB: Feb 05, 1976 Today's Date: 12/29/2018    History of Present Illness Pt is a 43 y/o male admitted after fall. Found to have acute renal failure secondary to rhabdomyolysis. Pt is s/p tunneled HD catheter and started HD on 4/7. PMH includes DM, bipolar disorder, HTN, and alcohol abuse.     PT Comments    Pt progressing slowly towards physical therapy goals. Pt requires increased encouragement to complete tasks on his own and often appears to be waiting for therapist to step in and complete a task for him. Motivation is decreased but will attempt anything therapist specifically asks him to do. Plan is for pt to d/c home with his mother who has a flight of stairs to enter her 2nd floor apartment. Will continue to follow and progress as able per POC.     Follow Up Recommendations  Home health PT;Supervision for mobility/OOB     Equipment Recommendations  Rolling walker with 5" wheels    Recommendations for Other Services       Precautions / Restrictions Precautions Precautions: Fall Restrictions Weight Bearing Restrictions: No    Mobility  Bed Mobility Overal bed mobility: Needs Assistance Bed Mobility: Sit to Supine     Supine to sit: Supervision     General bed mobility comments: HOB elevated and rails utilized  Transfers Overall transfer level: Needs assistance Equipment used: Rolling walker (2 wheeled) Transfers: Sit to/from Stand Sit to Stand: Min guard         General transfer comment: Min guard for balance support and safety.   Ambulation/Gait Ambulation/Gait assistance: Supervision Gait Distance (Feet): 300 Feet Assistive device: Rolling walker (2 wheeled) Gait Pattern/deviations: Step-through pattern;Decreased stride length;Wide base of support Gait velocity: Decreased  Gait velocity interpretation: 1.31 - 2.62 ft/sec, indicative of limited community  ambulator General Gait Details: Slow but generally steady with RW in hall. In room, noted pt picking up walker and carrying it for a couple steps. When advised to attempt without the RW, pt declines.    Stairs Stairs: Yes Stairs assistance: Min guard Stair Management: Two rails;Step to pattern;Forwards Number of Stairs: 11 General stair comments: Noted pt had difficulty advancing to the next step. Leading with R foot, pt caught the next step up almost every time when attempting to advance.    Wheelchair Mobility    Modified Rankin (Stroke Patients Only)       Balance Overall balance assessment: Needs assistance Sitting-balance support: No upper extremity supported;Feet supported Sitting balance-Leahy Scale: Good     Standing balance support: Bilateral upper extremity supported;During functional activity Standing balance-Leahy Scale: Poor Standing balance comment: walker and supervision for static standing                            Cognition Arousal/Alertness: Awake/alert Behavior During Therapy: Flat affect Overall Cognitive Status: Within Functional Limits for tasks assessed                                 General Comments: increased time for processing      Exercises      General Comments        Pertinent Vitals/Pain Pain Assessment: No/denies pain Faces Pain Scale: Hurts little more Pain Location: BLE  Pain Descriptors / Indicators: Guarding;Tightness Pain Intervention(s): Monitored during session    Home Living  Prior Function            PT Goals (current goals can now be found in the care plan section) Acute Rehab PT Goals Patient Stated Goal: to get better PT Goal Formulation: With patient Time For Goal Achievement: 01/06/19 Potential to Achieve Goals: Good Progress towards PT goals: Progressing toward goals    Frequency    Min 3X/week      PT Plan Current plan remains appropriate     Co-evaluation              AM-PAC PT "6 Clicks" Mobility   Outcome Measure  Help needed turning from your back to your side while in a flat bed without using bedrails?: None Help needed moving from lying on your back to sitting on the side of a flat bed without using bedrails?: None Help needed moving to and from a bed to a chair (including a wheelchair)?: A Little Help needed standing up from a chair using your arms (e.g., wheelchair or bedside chair)?: A Little Help needed to walk in hospital room?: A Little Help needed climbing 3-5 steps with a railing? : A Little 6 Click Score: 20    End of Session Equipment Utilized During Treatment: Gait belt Activity Tolerance: Patient tolerated treatment well Patient left: with call bell/phone within reach;in chair Nurse Communication: Mobility status PT Visit Diagnosis: Other abnormalities of gait and mobility (R26.89);Unsteadiness on feet (R26.81);Muscle weakness (generalized) (M62.81)     Time: 7591-6384 PT Time Calculation (min) (ACUTE ONLY): 17 min  Charges:  $Gait Training: 8-22 mins                     James [name], PT, DPT Acute Rehabilitation Services Pager: [phone] Office: [phone]    James [name] 12/29/2018, 2:05 PM

## 2018-12-29 NOTE — Progress Notes (Signed)
Patient ID: James [name], male   DOB: 1976-06-26, 43 y.o.   MRN: [id number] [facility] Progress Note   Assessment/ Plan:   1. Acute kidney Injury: Improving urine output but with no clear functional renal recovery.  I will request interventional radiology to place a temporary dialysis catheter tomorrow anticipating need for dialysis to help manage azotemia.  I am optimistic for renal recovery but this may be slow and protracted based on severity of injury. 2.  Volume overload: Overall volume status improved with hemodialysis/ultrafiltration, he is restricting oral fluids. 3.  Bipolar disorder/anxiety: Ongoing management per primary service. 4.  History of alcohol abuse: We will continue to monitor closely for withdrawal symptoms, initial ultrasound suggestive of early development of cirrhosis (suspect fatty liver has a contributory effect). 5.  Hyponatremia: Secondary to acute kidney injury and unrestricted oral fluid intake-reeducated on fluid restriction. 6. MSSA bacteremia: On intravenous cefazolin and hemodialysis catheter removed 4/14 to provide "catheter holiday" and better success at clearing bacteremia.  TTE did not show obvious vegetation and surveillance blood culture negative to date. Subjective:   Denies any acute complaints overnight and reports to be following fluid restriction.   Objective:   BP (!) 171/78   Pulse 61   Temp 98.1 F (36.7 C) (Oral)   Resp 15   Ht 6\' 3"  (1.905 m)   Wt 119.5 kg   SpO2 93%   BMI 32.93 kg/m   Intake/Output Summary (Last 24 hours) at 12/29/2018 1049 Last data filed at 12/29/2018 0950 Gross per 24 hour  Intake 790 ml  Output 200 ml  Net 590 ml   Weight change:   Physical Exam: Gen: Resting comfortably in bed, watching television CVS: Pulse regular rhythm, normal rate, S1 and S2 normal Resp: Anteriorly clear to auscultation, no rales.  Abd: Soft, obese, nontender Ext: 1-2+ anasarca.  Imaging: No results  found.  Labs: BMET Recent Labs  Lab 12/23/18 0213 12/24/18 0418 12/26/18 0347 12/27/18 0505 12/28/18 0307 12/29/18 0446  NA 132* 129* 127* 129* 132* 131*  K 4.0 4.0 5.7* 4.6 4.3 4.4  CL 97* 92* 91* 92* 95* 94*  CO2 24 23 23 26 27 25   GLUCOSE 87 82 98 88 88 88  BUN 28* 38* 46* 41* 31* 48*  CREATININE 5.02* 6.96* 7.80* 6.26* 5.07* 6.82*  CALCIUM 8.1* 8.5* 8.5* 8.2* 8.1* 8.2*   CBC Recent Labs  Lab 12/26/18 0347 12/27/18 0505 12/28/18 0307 12/29/18 0446  WBC 10.5 9.0 8.0 6.6  NEUTROABS 9.0* 6.9 5.7  --   HGB 9.0* 8.2* 8.5* 8.5*  HCT 26.6* 24.6* 25.0* 25.7*  MCV 92.7 93.9 91.6 91.5  PLT 253 242 272 291    Medications:    . amLODipine  10 mg Oral Daily  . feeding supplement (ENSURE ENLIVE)  237 mL Oral BID BM  . FLUoxetine  40 mg Oral Daily  . folic acid  1 mg Oral Daily  . heparin  5,000 Units Subcutaneous Q8H  . insulin aspart  0-5 Units Subcutaneous QHS  . insulin aspart  0-9 Units Subcutaneous TID WC  . loxapine  25 mg Oral Daily  . loxapine  50 mg Oral QHS  . metoprolol tartrate  25 mg Oral BID  . polyethylene glycol  17 g Oral Daily  . risperiDONE  9 mg Oral QHS  . senna-docusate  1 tablet Oral BID  . thiamine  100 mg Oral Daily  . topiramate  250 mg Oral QHS  . traZODone  300 mg Oral QHS   Elmarie Shiley, MD 12/29/2018, 10:49 AM

## 2018-12-29 NOTE — Progress Notes (Signed)
PROGRESS NOTE    Kortney Schoenfelder Sistare  IOE:[id number] DOB: [birth year] DOA: 12/19/2018 PCP: No primary care provider on file.    Brief Narrative:  43 year old male with history of hypertension, diabetes mellitus type 2, bipolar disorder presented on 12/17/2018 to [facility] after being found down for 2 days following binge alcohol drinking.  He was found to have AKI, metabolic acidosis, leukocytosis and LFT elevation with rhabdomyolysis, CK of 58,000.  He was treated with aggressive IV fluids but unfortunately his renal function got worse with decreased urine output and CK rose to 130,000.  No improvement with IV diuresis.  He was transferred to [facility] on 12/19/2018.  Nephrology was consulted.  Tunneled HD catheter was inserted and hemodialysis was started on 12/20/2018.  HD catheter subsequently removed 12/27/2018.  Patient also noted to have a MSSA bacteremia.  ID consulted.   Assessment & Plan:   Principal Problem:   MSSA bacteremia Active Problems:   Diabetes mellitus ([place])   Bipolar affective disorder ([place])   ARF (acute renal failure) ([place])   Alcoholism ([place])   AKI (acute kidney injury) ([place])   Hemodialysis catheter infection ([place])   Traumatic rhabdomyolysis ([place])  1 MSSA bacteremia Questionable etiology.  Concern for possible line infection.  2D echo which was done was negative for infective endocarditis and only mild mitral valvular regurgitation and trivial tricuspid valvular regurgitation.  TEE was unable to be done at this time.  HD catheter line was removed last night 12/27/2018.  Repeat blood cultures have been ordered per ID which are currently pending.  Blood cultures from 12/27/2018 with no growth to date.  ID recommending treating with IV cefazolin x14 days from line removal with day 1 either 12/27/2018 or 12/28/2018 with treatment through 01/10/2019.  ID following and appreciate input and recommendations.  2.  Acute renal failure Felt likely secondary to  rhabdomyolysis plus or minus ATN.  Patient had a HD catheter line placed and underwent hemodialysis on Monday and Tuesday followed by removal of dialysis catheter due to concerns might be etiology of MSSA bacteremia.  Patient urine output of 200 cc over the past 24 hours which was recorded.  Creatinine fluctuating currently at 6.82 today from 5.07 from 6.26 from 7.80.  Per nephrology who is recommending temporary dialysis catheter to be placed tomorrow in anticipation of the need for dialysis to help patient's azotemia.  Per nephrology.   3.  Rhabdomyolysis Improving daily.  Patient noted to have initial presentation with a CK level of 58,000 and treated aggressively with IV fluids however renal function worsened with decreased urine output and worsening CK of 130,000.  No improvement with diuresis.  Patient subsequently underwent hemodialysis.  CK levels have trended down and currently at 984. Follow.  4.  Volume overload Improved with hemodialysis.  HD catheter has been removed.  Monitor urine output.  Follow.  5.  Alcohol abuse No signs of alcohol withdrawal noted.  Was on the Librium tapering doses which has been discontinued.  Alcohol cessation stressed to patient.  Follow.  6.  Transaminitis Felt secondary to rhabdomyolysis and alcohol use.  Acute hepatitis panel negative.  HIV nonreactive.  Right upper quadrant ultrasound with early cirrhotic changes noted.  LFTs trending down.  Follow.  7.  Hyponatremia Secondary to hypervolemic hyponatremia secondary to acute kidney injury.  Improved with hemodialysis.  Follow.  8.  Diabetes mellitus type 2 Hemoglobin A1c of 5.2.  Oral diabetic medications on hold.  CBG of 84 this morning.  D/C  sliding scale insulin.   9.  Elevated troponin Secondary to rhabdomyolysis.  No further interventions at this time.  10.  Maculopapular rash Improved.  11.  Obesity  12.  Bipolar disorder/anxiety Continue current regimen of Prozac, Risperdal, Topamax.   Trazodone nightly.  13.  Hypertension Continue Lopressor and Norvasc.   DVT prophylaxis: Heparin Code Status: Full Family Communication: Updated patient.  No family at bedside. Disposition Plan: Likely home with home health when clinically stable, improvement with renal function, when okay with nephrology and ID.   Consultants:   Infectious disease: Dr. Rhina Brackett [place] 12/26/2018  Nephrology: Dr. Hollie Salk 12/19/2018  Procedures:   Chest x-ray 12/25/2018  Renal ultrasound 12/20/2018  Right IJ catheter placement under ultrasound and fluoroscopic guidance per [facility] interventional radiology for 12/20/2018  2D echo 12/26/2018  Antimicrobials:   IV Ancef 12/26/2018>>>>> 01/10/2019   Subjective: Sitting up in bed.  Denies any chest pain or shortness of breath.  Urine output recorded of 200 cc over the past 24 hours.    Objective: Vitals:   12/28/18 1525 12/28/18 2158 12/29/18 0535 12/29/18 0536  BP: 117/70 (!) 161/87 (!) 168/83 (!) 171/78  Pulse: 70 70 63 61  Resp: 15 18 15    Temp: 98.4 F (36.9 C) 98.2 F (36.8 C) 98.1 F (36.7 C)   TempSrc: Oral Oral Oral   SpO2: 93% 95% 94% 93%  Weight:      Height:        Intake/Output Summary (Last 24 hours) at 12/29/2018 1245 Last data filed at 12/29/2018 0950 Gross per 24 hour  Intake 790 ml  Output -  Net 790 ml   Filed Weights   12/27/18 1330 12/27/18 1716 12/28/18 0636  Weight: 122.1 kg 119.1 kg 119.5 kg    Examination:  General exam: NAD Respiratory system: CTAB anterior lung fields.  No wheezes, no crackles, no rhonchi.  Respiratory effort normal. Cardiovascular system: Regular rate and rhythm no murmurs rubs or gallops.  No JVD.  2+ bilateral lower extremity edema.  Gastrointestinal system: Abdomen is soft, nontender, nondistended, positive bowel sounds.  No rebound.  No guarding.  Central nervous system: Alert and oriented. No focal neurological deficits. Extremities: 2+ BLE edema Symmetric 5 x 5 power. Skin: No  rashes, lesions or ulcers Psychiatry: Judgement and insight appear fair. Mood & affect appropriate.     Data Reviewed: I have personally reviewed following labs and imaging studies  CBC: Recent Labs  Lab 12/24/18 0418 12/26/18 0347 12/27/18 0505 12/28/18 0307 12/29/18 0446  WBC 6.8 10.5 9.0 8.0 6.6  NEUTROABS  --  9.0* 6.9 5.7  --   HGB 9.8* 9.0* 8.2* 8.5* 8.5*  HCT 28.7* 26.6* 24.6* 25.0* 25.7*  MCV 92.3 92.7 93.9 91.6 91.5  PLT 239 253 242 272 903   Basic Metabolic Panel: Recent Labs  Lab 12/23/18 0213 12/24/18 0418 12/26/18 0347 12/27/18 0505 12/28/18 0307 12/29/18 0446  NA 132* 129* 127* 129* 132* 131*  K 4.0 4.0 5.7* 4.6 4.3 4.4  CL 97* 92* 91* 92* 95* 94*  CO2 24 23 23 26 27 25   GLUCOSE 87 82 98 88 88 88  BUN 28* 38* 46* 41* 31* 48*  CREATININE 5.02* 6.96* 7.80* 6.26* 5.07* 6.82*  CALCIUM 8.1* 8.5* 8.5* 8.2* 8.1* 8.2*  MG 1.7 1.8 1.9 2.1 2.0  --    GFR: Estimated Creatinine Clearance: 19.1 mL/min (A) (by C-G formula based on SCr of 6.82 mg/dL (H)). Liver Function Tests: Recent Labs  Lab 12/23/18 818 568 0423  12/24/18 0418 12/28/18 0307 12/29/18 0446  AST 299* 196* 73* 49*  ALT 116* 96* 34 17  ALKPHOS 54 48 87 73  BILITOT 0.4 0.4 0.5 0.3  PROT 4.7* 4.6* 5.0* 4.9*  ALBUMIN 2.1* 2.0* 2.0* 2.0*   No results for input(s): LIPASE, AMYLASE in the last 168 hours. No results for input(s): AMMONIA in the last 168 hours. Coagulation Profile: No results for input(s): INR, PROTIME in the last 168 hours. Cardiac Enzymes: Recent Labs  Lab 12/24/18 0418 12/26/18 0347 12/28/18 0307 12/29/18 0446  CKTOTAL 8,634* 3,103* 1,446* 984*   BNP (last 3 results) No results for input(s): PROBNP in the last 8760 hours. HbA1C: Recent Labs    12/29/18 0446  HGBA1C 5.2   CBG: Recent Labs  Lab 12/28/18 1206 12/28/18 1658 12/28/18 2202 12/29/18 0814 12/29/18 1216  GLUCAP 79 87 80 84 120*   Lipid Profile: No results for input(s): CHOL, HDL, LDLCALC, TRIG, CHOLHDL,  LDLDIRECT in the last 72 hours. Thyroid Function Tests: No results for input(s): TSH, T4TOTAL, FREET4, T3FREE, THYROIDAB in the last 72 hours. Anemia Panel: No results for input(s): VITAMINB12, FOLATE, FERRITIN, TIBC, IRON, RETICCTPCT in the last 72 hours. Sepsis Labs: No results for input(s): PROCALCITON, LATICACIDVEN in the last 168 hours.  Recent Results (from the past 240 hour(s))  Culture, blood (routine x 2)     Status: Abnormal   Collection Time: 12/25/18 10:00 AM  Result Value Ref Range Status   Specimen Description BLOOD LEFT HAND  Final   Special Requests AEROBIC BOTTLE ONLY Blood Culture adequate volume  Final   Culture  Setup Time   Final    GRAM POSITIVE COCCI IN CLUSTERS AEROBIC BOTTLE ONLY CRITICAL VALUE NOTED.  VALUE IS CONSISTENT WITH PREVIOUSLY REPORTED AND CALLED VALUE.    Culture (A)  Final    STAPHYLOCOCCUS AUREUS SUSCEPTIBILITIES PERFORMED ON PREVIOUS CULTURE WITHIN THE LAST 5 DAYS. Performed at [facility], [place] [address] [place], [place] [address]    Report Status 12/28/2018 FINAL  Final  Culture, blood (routine x 2)     Status: Abnormal   Collection Time: 12/25/18 10:04 AM  Result Value Ref Range Status   Specimen Description BLOOD LEFT HAND  Final   Special Requests AEROBIC BOTTLE ONLY Blood Culture adequate volume  Final   Culture  Setup Time   Final    GRAM POSITIVE COCCI IN CLUSTERS AEROBIC BOTTLE ONLY CRITICAL RESULT CALLED TO, READ BACK BY AND VERIFIED WITH: Salli Real 1443 12/26/2018 Mena Goes Performed at [facility], [place] [address] [place], [place] [address]    Culture STAPHYLOCOCCUS AUREUS (A)  Final   Report Status 12/28/2018 FINAL  Final   Organism ID, Bacteria STAPHYLOCOCCUS AUREUS  Final      Susceptibility   Staphylococcus aureus - MIC*    CIPROFLOXACIN <=0.5 SENSITIVE Sensitive     ERYTHROMYCIN <=0.25 SENSITIVE Sensitive     GENTAMICIN <=0.5 SENSITIVE Sensitive     OXACILLIN 0.5 SENSITIVE Sensitive      TETRACYCLINE <=1 SENSITIVE Sensitive     VANCOMYCIN <=0.5 SENSITIVE Sensitive     TRIMETH/SULFA <=10 SENSITIVE Sensitive     CLINDAMYCIN <=0.25 SENSITIVE Sensitive     RIFAMPIN <=0.5 SENSITIVE Sensitive     Inducible Clindamycin NEGATIVE Sensitive     * STAPHYLOCOCCUS AUREUS  Blood Culture ID Panel (Reflexed)     Status: Abnormal   Collection Time: 12/25/18 10:04 AM  Result Value Ref Range Status   Enterococcus species NOT DETECTED  NOT DETECTED Final   Listeria monocytogenes NOT DETECTED NOT DETECTED Final   Staphylococcus species DETECTED (A) NOT DETECTED Final    Comment: CRITICAL RESULT CALLED TO, READ BACK BY AND VERIFIED WITH: G. ABBOTT,PHARMD [id number] 12/26/2018 T. TYSOR    Staphylococcus aureus (BCID) DETECTED (A) NOT DETECTED Final    Comment: Methicillin (oxacillin) susceptible Staphylococcus aureus (MSSA). Preferred therapy is anti staphylococcal beta lactam antibiotic (Cefazolin or Nafcillin), unless clinically contraindicated. CRITICAL RESULT CALLED TO, READ BACK BY AND VERIFIED WITH: G. ABBOTT,PHARMD [id number] 12/26/2018 T. TYSOR    Methicillin resistance NOT DETECTED NOT DETECTED Final   Streptococcus species NOT DETECTED NOT DETECTED Final   Streptococcus agalactiae NOT DETECTED NOT DETECTED Final   Streptococcus pneumoniae NOT DETECTED NOT DETECTED Final   Streptococcus pyogenes NOT DETECTED NOT DETECTED Final   Acinetobacter baumannii NOT DETECTED NOT DETECTED Final   Enterobacteriaceae species NOT DETECTED NOT DETECTED Final   Enterobacter cloacae complex NOT DETECTED NOT DETECTED Final   Escherichia coli NOT DETECTED NOT DETECTED Final   Klebsiella oxytoca NOT DETECTED NOT DETECTED Final   Klebsiella pneumoniae NOT DETECTED NOT DETECTED Final   Proteus species NOT DETECTED NOT DETECTED Final   Serratia marcescens NOT DETECTED NOT DETECTED Final   Haemophilus influenzae NOT DETECTED NOT DETECTED Final   Neisseria meningitidis NOT DETECTED NOT DETECTED Final   Pseudomonas  aeruginosa NOT DETECTED NOT DETECTED Final   Candida albicans NOT DETECTED NOT DETECTED Final   Candida glabrata NOT DETECTED NOT DETECTED Final   Candida krusei NOT DETECTED NOT DETECTED Final   Candida parapsilosis NOT DETECTED NOT DETECTED Final   Candida tropicalis NOT DETECTED NOT DETECTED Final    Comment: Performed at [facility], [place]. [address] [place], [place] [address]  Culture, Urine     Status: Abnormal   Collection Time: 12/25/18  5:00 PM  Result Value Ref Range Status   Specimen Description URINE, CLEAN CATCH  Final   Special Requests   Final    NONE Performed at [facility], [place] [address], [place], [place] [address]    Culture >=100,000 COLONIES/mL STAPHYLOCOCCUS EPIDERMIDIS (A)  Final   Report Status 12/27/2018 FINAL  Final   Organism ID, Bacteria STAPHYLOCOCCUS EPIDERMIDIS (A)  Final      Susceptibility   Staphylococcus epidermidis - MIC*    CIPROFLOXACIN >=8 RESISTANT Resistant     GENTAMICIN <=0.5 SENSITIVE Sensitive     NITROFURANTOIN <=16 SENSITIVE Sensitive     OXACILLIN >=4 RESISTANT Resistant     TETRACYCLINE <=1 SENSITIVE Sensitive     VANCOMYCIN 2 SENSITIVE Sensitive     TRIMETH/SULFA <=10 SENSITIVE Sensitive     CLINDAMYCIN <=0.25 SENSITIVE Sensitive     RIFAMPIN <=0.5 SENSITIVE Sensitive     Inducible Clindamycin NEGATIVE Sensitive     * >=100,000 COLONIES/mL STAPHYLOCOCCUS EPIDERMIDIS  Culture, blood (routine x 2)     Status: None (Preliminary result)   Collection Time: 12/27/18  7:35 AM  Result Value Ref Range Status   Specimen Description BLOOD LEFT HAND  Final   Special Requests   Final    BOTTLES DRAWN AEROBIC ONLY Blood Culture adequate volume   Culture   Final    NO GROWTH 2 DAYS Performed at [facility], [place] [address] [place], [place] [address]    Report Status PENDING  Incomplete  Culture, blood (routine x 2)     Status: None (Preliminary result)   Collection Time: 12/27/18  7:37 AM  Result Value  Ref Range  Status   Specimen Description BLOOD LEFT WRIST  Final   Special Requests   Final    BOTTLES DRAWN AEROBIC ONLY Blood Culture adequate volume   Culture   Final    NO GROWTH 2 DAYS Performed at [facility], [address] [place], [place] [address]    Report Status PENDING  Incomplete  Culture, blood (Routine X 2) w Reflex to ID Panel     Status: None (Preliminary result)   Collection Time: 12/28/18 10:00 AM  Result Value Ref Range Status   Specimen Description BLOOD LEFT HAND  Final   Special Requests   Final    BOTTLES DRAWN AEROBIC AND ANAEROBIC Blood Culture adequate volume   Culture   Final    NO GROWTH 1 DAY Performed at [facility], [place] [address] [place], [place] [address]    Report Status PENDING  Incomplete  Culture, blood (Routine X 2) w Reflex to ID Panel     Status: None (Preliminary result)   Collection Time: 12/28/18 10:15 AM  Result Value Ref Range Status   Specimen Description BLOOD LEFT HAND  Final   Special Requests   Final    BOTTLES DRAWN AEROBIC AND ANAEROBIC Blood Culture adequate volume   Culture   Final    NO GROWTH 1 DAY Performed at [facility], [place] [address], [place], [place] [address]    Report Status PENDING  Incomplete         Radiology Studies: No results found.      Scheduled Meds: . amLODipine  10 mg Oral Daily  . feeding supplement (ENSURE ENLIVE)  237 mL Oral BID BM  . FLUoxetine  40 mg Oral Daily  . folic acid  1 mg Oral Daily  . heparin  5,000 Units Subcutaneous Q8H  . insulin aspart  0-5 Units Subcutaneous QHS  . insulin aspart  0-9 Units Subcutaneous TID WC  . loxapine  25 mg Oral Daily  . loxapine  50 mg Oral QHS  . metoprolol tartrate  25 mg Oral BID  . polyethylene glycol  17 g Oral Daily  . risperiDONE  9 mg Oral QHS  . senna-docusate  1 tablet Oral BID  . thiamine  100 mg Oral Daily  . topiramate  250 mg Oral QHS  . traZODone  300 mg Oral QHS   Continuous Infusions: . sodium chloride     . sodium chloride    . sodium chloride Stopped (12/26/18 0534)  .  ceFAZolin (ANCEF) IV 1 g (12/28/18 2204)     LOS: 10 days    Time spent: 35 minutes    Irine Seal, MD [facility]  If 7PM-7AM, please contact night-coverage [email or web address] 12/29/2018, 12:45 PM

## 2018-12-29 NOTE — Progress Notes (Signed)
Pharmacy Antibiotic Note  James [name] is a 43 y.o. male admitted on 12/19/2018 with MSSA bacteremia.  Pharmacy has been consulted for cefazolin dosing. Patient with AKI requiring HD. Patient's TDC has been pulled and is currently on line holiday with Cultures from 4/14 NGTD. ID is consulted and recommends 14 days of cefazolin from line removal with a stop date of 01/10/19.   Due to non-regular HD schedule and line holiday, will dose cefazolin q24h. If patient starts back on regular HD schedule, will start dosing with HD.  Plan: Cefazolin 1gm IV q24h Monitor for HD schedule change, renal function improvement and clinical course   Height: 6\' 3"  (190.5 cm) Weight: 263 lb 7.2 oz (119.5 kg) IBW/kg (Calculated) : 84.5  Temp (24hrs), Avg:98.2 F (36.8 C), Min:98.1 F (36.7 C), Max:98.4 F (36.9 C)  Recent Labs  Lab 12/24/18 0418 12/26/18 0347 12/27/18 0505 12/28/18 0307 12/29/18 0446  WBC 6.8 10.5 9.0 8.0 6.6  CREATININE 6.96* 7.80* 6.26* 5.07* 6.82*    Estimated Creatinine Clearance: 19.1 mL/min (A) (by C-G formula based on SCr of 6.82 mg/dL (H)).    No Known Allergies  Microbiology results: 4/12 BCx: MSSA 4/14 BCx: NGTD  Ova Gillentine A. Levada Dy, PharmD, [place] Please utilize Amion for appropriate phone number to reach the unit pharmacist ([place])   12/29/2018 9:34 AM

## 2018-12-29 NOTE — Progress Notes (Addendum)
[name] for Infectious Disease  Date of Admission:  12/19/2018     Total days of antibiotics 4 Cefazolin          Patient ID: James [name] is a 43 y.o. male with  Principal Problem:   MSSA bacteremia Active Problems:   Diabetes mellitus ([place])   Bipolar affective disorder ([place])   ARF (acute renal failure) ([place])   Alcoholism ([place])   AKI (acute kidney injury) ([place])   Hemodialysis catheter infection ([place])   Traumatic rhabdomyolysis ([place])   . amLODipine  10 mg Oral Daily  . feeding supplement (ENSURE ENLIVE)  237 mL Oral BID BM  . FLUoxetine  40 mg Oral Daily  . folic acid  1 mg Oral Daily  . heparin  5,000 Units Subcutaneous Q8H  . insulin aspart  0-5 Units Subcutaneous QHS  . insulin aspart  0-9 Units Subcutaneous TID WC  . loxapine  25 mg Oral Daily  . loxapine  50 mg Oral QHS  . metoprolol tartrate  25 mg Oral BID  . polyethylene glycol  17 g Oral Daily  . risperiDONE  9 mg Oral QHS  . senna-docusate  1 tablet Oral BID  . thiamine  100 mg Oral Daily  . topiramate  250 mg Oral QHS  . traZODone  300 mg Oral QHS    SUBJECTIVE: No new complaints and remains afebrile. Plans for replacing HD line tomorrow. Nephrology hopeful for renal recovery.   Review of Systems: Review of Systems  Constitutional: Positive for malaise/fatigue. Negative for fever.  Respiratory: Negative for shortness of breath.   Cardiovascular: Positive for leg swelling. Negative for chest pain.  Gastrointestinal: Negative for abdominal pain, diarrhea and vomiting.  Genitourinary:       Oliguric  Musculoskeletal: Negative for myalgias.  Skin: Negative for rash.  Neurological: Positive for weakness (generalized).  All other systems reviewed and are negative.   No Known Allergies  OBJECTIVE: Vitals:   12/28/18 2158 12/29/18 0535 12/29/18 0536 12/29/18 1503  BP: (!) 161/87 (!) 168/83 (!) 171/78 (!) 152/86  Pulse: 70 63 61 66  Resp: 18 15  (!) 23  Temp: 98.2 F (36.8  C) 98.1 F (36.7 C)  98.7 F (37.1 C)  TempSrc: Oral Oral  Oral  SpO2: 95% 94% 93% 94%  Weight:      Height:       Body mass index is 32.93 kg/m.   Physical Exam Constitutional:      Comments: Resting comfortably in chair watching TV. No distress.   HENT:     Mouth/Throat:     Mouth: Mucous membranes are moist.     Pharynx: Oropharynx is clear.  Neck:     Comments: Bulky dressing to neck. Clean and dry.  Cardiovascular:     Rate and Rhythm: Normal rate.     Pulses: Normal pulses.     Heart sounds: No murmur.  Pulmonary:     Effort: Pulmonary effort is normal.     Breath sounds: Normal breath sounds.  Skin:    General: Skin is warm and dry.     Capillary Refill: Capillary refill takes less than 2 seconds.     Comments: Multiple areas of scabbed over abrasions as previously documented. Remains generally edematous.   Neurological:     Mental Status: He is oriented to person, place, and time.     Lab Results Lab Results  Component Value Date   WBC 6.6  12/29/2018   HGB 8.5 (L) 12/29/2018   HCT 25.7 (L) 12/29/2018   MCV 91.5 12/29/2018   PLT 291 12/29/2018    Lab Results  Component Value Date   CREATININE 6.82 (H) 12/29/2018   BUN 48 (H) 12/29/2018   NA 131 (L) 12/29/2018   K 4.4 12/29/2018   CL 94 (L) 12/29/2018   CO2 25 12/29/2018    Lab Results  Component Value Date   ALT 17 12/29/2018   AST 49 (H) 12/29/2018   ALKPHOS 73 12/29/2018   BILITOT 0.3 12/29/2018     Microbiology: BCx 4/12 >> MSSA 2/2 sets BCx 4/14 >> pending (line still in) BCx 4/15 >> not yet drawn   ASSESSMENT & PLAN:   1. MSSA Bacteremia = TTE negative for IE and only mild MV regurg and trivial TV regurg. Unable to do TEE at this time. Possible for entry with multiple skin wounds however he was not febrile/bacteremic until well into his hospital stay.  Exam otherwise unrevealing for cause - likely related to HD line.  Repeated blood cultures thusfar. No other signs of metastatic  infection. Would continue to treat with Cefazolin IV x 14 days from line removal (through 4/28) barring no blood cultures turn positive in the interval.   2. AKI with Acute HD Need = urine output picking up. CK improving. Nephrology is following closely and assessing daily for HD needs, hopeful for long term recovery but anticipated to be slow. Adjust abx intervals as indicated with renal recovery.   3. ?Early Cirrhosis = hep b antigen negative. Hep c Ab negative. Likely due to fatty liver disease and excessive/frequent alcohol consumption.   Janene Madeira, MSN, NP-C [facility]ious [place] Cell: [phone] Pager: [phone]  12/29/2018  3:44 PM

## 2018-12-30 ENCOUNTER — Inpatient Hospital Stay (HOSPITAL_COMMUNITY): Payer: Medicare HMO

## 2018-12-30 ENCOUNTER — Encounter (HOSPITAL_COMMUNITY): Payer: Self-pay | Admitting: Diagnostic Radiology

## 2018-12-30 DIAGNOSIS — M7989 Other specified soft tissue disorders: Secondary | ICD-10-CM

## 2018-12-30 DIAGNOSIS — N5089 Other specified disorders of the male genital organs: Secondary | ICD-10-CM

## 2018-12-30 DIAGNOSIS — R011 Cardiac murmur, unspecified: Secondary | ICD-10-CM

## 2018-12-30 DIAGNOSIS — R531 Weakness: Secondary | ICD-10-CM

## 2018-12-30 DIAGNOSIS — R34 Anuria and oliguria: Secondary | ICD-10-CM

## 2018-12-30 HISTORY — PX: IR FLUORO GUIDE CV LINE LEFT: IMG2282

## 2018-12-30 HISTORY — PX: IR US GUIDE VASC ACCESS LEFT: IMG2389

## 2018-12-30 LAB — CBC WITH DIFFERENTIAL/PLATELET
Abs Immature Granulocytes: 0.12 10*3/uL — ABNORMAL HIGH (ref 0.00–0.07)
Basophils Absolute: 0 10*3/uL (ref 0.0–0.1)
Basophils Relative: 1 %
Eosinophils Absolute: 0.2 10*3/uL (ref 0.0–0.5)
Eosinophils Relative: 3 %
HCT: 25.4 % — ABNORMAL LOW (ref 39.0–52.0)
Hemoglobin: 8.3 g/dL — ABNORMAL LOW (ref 13.0–17.0)
Immature Granulocytes: 2 %
Lymphocytes Relative: 20 %
Lymphs Abs: 1.4 10*3/uL (ref 0.7–4.0)
MCH: 29.9 pg (ref 26.0–34.0)
MCHC: 32.7 g/dL (ref 30.0–36.0)
MCV: 91.4 fL (ref 80.0–100.0)
Monocytes Absolute: 0.7 10*3/uL (ref 0.1–1.0)
Monocytes Relative: 10 %
Neutro Abs: 4.6 10*3/uL (ref 1.7–7.7)
Neutrophils Relative %: 64 %
Platelets: 326 10*3/uL (ref 150–400)
RBC: 2.78 MIL/uL — ABNORMAL LOW (ref 4.22–5.81)
RDW: 11.9 % (ref 11.5–15.5)
WBC: 7.1 10*3/uL (ref 4.0–10.5)
nRBC: 0 % (ref 0.0–0.2)

## 2018-12-30 LAB — RENAL FUNCTION PANEL
Albumin: 2.1 g/dL — ABNORMAL LOW (ref 3.5–5.0)
Anion gap: 12 (ref 5–15)
BUN: 58 mg/dL — ABNORMAL HIGH (ref 6–20)
CO2: 24 mmol/L (ref 22–32)
Calcium: 8.2 mg/dL — ABNORMAL LOW (ref 8.9–10.3)
Chloride: 92 mmol/L — ABNORMAL LOW (ref 98–111)
Creatinine, Ser: 8.03 mg/dL — ABNORMAL HIGH (ref 0.61–1.24)
GFR calc Af Amer: 8 mL/min — ABNORMAL LOW (ref >60)
GFR calc non Af Amer: 7 mL/min — ABNORMAL LOW (ref >60)
Glucose, Bld: 82 mg/dL (ref 70–99)
Phosphorus: 5.5 mg/dL — ABNORMAL HIGH (ref 2.5–4.6)
Potassium: 4.7 mmol/L (ref 3.5–5.1)
Sodium: 128 mmol/L — ABNORMAL LOW (ref 135–145)

## 2018-12-30 LAB — GLUCOSE, CAPILLARY
Glucose-Capillary: 99 mg/dL (ref 70–99)
Glucose-Capillary: 93 mg/dL (ref 70–99)
Glucose-Capillary: 71 mg/dL (ref 70–99)

## 2018-12-30 LAB — HEPATIC FUNCTION PANEL
ALT: 12 U/L (ref 0–44)
AST: 41 U/L (ref 15–41)
Albumin: 2.1 g/dL — ABNORMAL LOW (ref 3.5–5.0)
Alkaline Phosphatase: 84 U/L (ref 38–126)
Bilirubin, Direct: <0.1 mg/dL (ref 0.0–0.2)
Total Bilirubin: 0.5 mg/dL (ref 0.3–1.2)
Total Protein: 5.1 g/dL — ABNORMAL LOW (ref 6.5–8.1)

## 2018-12-30 MED ORDER — LIDOCAINE HCL 1 % IJ SOLN
INTRAMUSCULAR | Status: AC | PRN
  Administered 2018-12-30: 5 mL

## 2018-12-30 MED ORDER — HEPARIN SODIUM (PORCINE) 1000 UNIT/ML IJ SOLN
INTRAMUSCULAR | Status: AC
  Filled 2018-12-30: qty 3

## 2018-12-30 MED ORDER — HEPARIN SODIUM (PORCINE) 1000 UNIT/ML IJ SOLN
INTRAMUSCULAR | Status: AC
  Administered 2018-12-30: 2.8 mL
  Filled 2018-12-30: qty 1

## 2018-12-30 MED ORDER — CHLORHEXIDINE GLUCONATE CLOTH 2 % EX PADS
6.0000 | MEDICATED_PAD | Freq: Every day | CUTANEOUS | Status: DC
  Administered 2018-12-30 – 2019-01-02 (×4): 6 via TOPICAL

## 2018-12-30 MED ORDER — LIDOCAINE HCL 1 % IJ SOLN
INTRAMUSCULAR | Status: AC
  Filled 2018-12-30: qty 20

## 2018-12-30 NOTE — Progress Notes (Signed)
PROGRESS NOTE    James [name]  ZOX:[id number] DOB: 06-19-1976 DOA: 12/19/2018 PCP: No primary care provider on file.    Brief Narrative:  43 year old male with history of hypertension, diabetes mellitus type 2, bipolar disorder presented on 12/17/2018 to [facility] after being found down for 2 days following binge alcohol drinking.  He was found to have AKI, metabolic acidosis, leukocytosis and LFT elevation with rhabdomyolysis, CK of 58,000.  He was treated with aggressive IV fluids but unfortunately his renal function got worse with decreased urine output and CK rose to 130,000.  No improvement with IV diuresis.  He was transferred to [facility] on 12/19/2018.  Nephrology was consulted.  Tunneled HD catheter was inserted and hemodialysis was started on 12/20/2018.  HD catheter subsequently removed 12/27/2018.  Patient also noted to have a MSSA bacteremia.  ID consulted.   Assessment & Plan:   Principal Problem:   MSSA bacteremia Active Problems:   Diabetes mellitus ([place])   Bipolar affective disorder ([place])   ARF (acute renal failure) ([place])   Alcoholism ([place])   AKI (acute kidney injury) ([place])   Hemodialysis catheter infection ([place])   Traumatic rhabdomyolysis ([place])  1 MSSA bacteremia Questionable etiology.  Concern for possible line infection.  2D echo which was done was negative for infective endocarditis and only mild mitral valvular regurgitation and trivial tricuspid valvular regurgitation.  TEE was unable to be done at this time.  HD catheter line was removed last night 12/27/2018.  Repeat blood cultures have been ordered per ID which are currently pending.  Blood cultures from 12/27/2018 with no growth to date.  ID recommending treating with IV cefazolin x14 days from line removal with day 1 either 12/27/2018 or 12/28/2018 with treatment through 01/10/2019.  ID following and appreciate input and recommendations.  2.  Acute renal failure Felt likely secondary to  rhabdomyolysis plus or minus ATN.  Patient had a HD catheter line placed and underwent hemodialysis on Monday and Tuesday followed by removal of dialysis catheter due to concerns might be etiology of MSSA bacteremia.  Patient urine output of 300 cc over the past 24 hours which was recorded.  Creatinine trending back up and currently at 8.03 from 6.82 from 5.07 from 6.26 from 7.80.  Per nephrology who is recommending temporary dialysis catheter to be placed today in anticipation of the need for dialysis to help patient's azotemia.  Per nephrology.   3.  Rhabdomyolysis Improving daily.  Patient noted to have initial presentation with a CK level of 58,000 and treated aggressively with IV fluids however renal function worsened with decreased urine output and worsening CK of 130,000.  Rhabdomyolysis initially was not improving with diuresis. Patient subsequently underwent hemodialysis.  CK levels have trended down and currently at 984. Follow.  4.  Volume overload Improved with hemodialysis.  HD catheter has been removed.  Patient still with volume overload noted on examination with scrotal swelling and some lower extremity edema.  Placement of non-tunneled dialysis catheter to be placed today.  Patient likely for hemodialysis.  Monitor urine output.  Follow.  5.  Alcohol abuse No signs of alcohol withdrawal noted.  Was on the Librium tapering doses which has been discontinued.  Alcohol cessation stressed to patient.  Follow.  6.  Transaminitis Felt secondary to rhabdomyolysis and alcohol use.  Acute hepatitis panel negative.  HIV nonreactive.  Right upper quadrant ultrasound with early cirrhotic changes noted.  LFTs trending down.  Follow.  7.  Hyponatremia Secondary to  hypervolemic hyponatremia secondary to acute kidney injury.  Initially improved with hemodialysis however trending down.  Placement of non-tunneled dialysis catheter to be placed today.  Patient likely to be started back on hemodialysis.  Follow.  8.  Diabetes mellitus type 2 Hemoglobin A1c of 5.2.  Oral diabetic medications on hold.  CBG of 99 this morning.  D/C sliding scale insulin.   9.  Elevated troponin Secondary to rhabdomyolysis.  No further interventions at this time.  10.  Maculopapular rash Improved.  11.  Obesity  12.  Bipolar disorder/anxiety Stable.  Continue current regimen of Prozac, Risperdal, Topamax.  Trazodone nightly.  13.  Hypertension Stable.  Continue Lopressor and Norvasc.     DVT prophylaxis: Heparin Code Status: Full Family Communication: Updated patient.  No family at bedside. Disposition Plan: Likely home with home health when clinically stable, improvement with renal function, when okay with nephrology and ID.   Consultants:   Infectious disease: Dr. Rhina Brackett [place] 12/26/2018  Nephrology: Dr. Hollie Salk 12/19/2018  Procedures:   Chest x-ray 12/25/2018  Renal ultrasound 12/20/2018  Right IJ catheter placement under ultrasound and fluoroscopic guidance per [place] interventional radiology for 12/20/2018  2D echo 12/26/2018  Antimicrobials:   IV Ancef 12/26/2018>>>>> 01/10/2019   Subjective: Sitting up in bed.  Denies any chest pain or shortness of breath.  Sitting up in bed.  Denies any chest pain or shortness of breath.  Urine output recorded of 300 cc over the past 24 hours.    Objective: Vitals:   12/29/18 1503 12/29/18 2149 12/30/18 0500 12/30/18 0519  BP: (!) 152/86 (!) 142/83  139/88  Pulse: 66 70  64  Resp: (!) 23 16  18   Temp: 98.7 F (37.1 C) 98.6 F (37 C)  98.6 F (37 C)  TempSrc: Oral Oral  Oral  SpO2: 94% 95%  98%  Weight:   122.8 kg   Height:        Intake/Output Summary (Last 24 hours) at 12/30/2018 1108 Last data filed at 12/30/2018 0835 Gross per 24 hour  Intake 410 ml  Output 300 ml  Net 110 ml   Filed Weights   12/27/18 1716 12/28/18 0636 12/30/18 0500  Weight: 119.1 kg 119.5 kg 122.8 kg    Examination:  General exam: NAD Respiratory  system: Lungs clear to auscultation bilaterally.  No wheezes, no crackles, no rhonchi.  Normal respiratory effort.  Cardiovascular system: RRR no murmurs rubs or gallops.  No JVD.  2+ bilateral lower extremity edema.  Gastrointestinal system: Abdomen is soft, nontender, nondistended, positive bowel sounds.  No rebound.  No guarding.  GU: Scrotal swelling. Central nervous system: Alert and oriented. No focal neurological deficits. Extremities: 2+ BLE edema Symmetric 5 x 5 power. Skin: No rashes, lesions or ulcers Psychiatry: Judgement and insight appear fair. Mood & affect appropriate.     Data Reviewed: I have personally reviewed following labs and imaging studies  CBC: Recent Labs  Lab 12/26/18 0347 12/27/18 0505 12/28/18 0307 12/29/18 0446 12/30/18 0237  WBC 10.5 9.0 8.0 6.6 7.1  NEUTROABS 9.0* 6.9 5.7  --  4.6  HGB 9.0* 8.2* 8.5* 8.5* 8.3*  HCT 26.6* 24.6* 25.0* 25.7* 25.4*  MCV 92.7 93.9 91.6 91.5 91.4  PLT 253 242 272 291 381   Basic Metabolic Panel: Recent Labs  Lab 12/24/18 0418 12/26/18 0347 12/27/18 0505 12/28/18 0307 12/29/18 0446 12/30/18 0237  NA 129* 127* 129* 132* 131* 128*  K 4.0 5.7* 4.6 4.3 4.4 4.7  CL 92* 91*  92* 95* 94* 92*  CO2 23 23 26 27 25 24   GLUCOSE 82 98 88 88 88 82  BUN 38* 46* 41* 31* 48* 58*  CREATININE 6.96* 7.80* 6.26* 5.07* 6.82* 8.03*  CALCIUM 8.5* 8.5* 8.2* 8.1* 8.2* 8.2*  MG 1.8 1.9 2.1 2.0  --   --   PHOS  --   --   --   --   --  5.5*   GFR: Estimated Creatinine Clearance: 16.4 mL/min (A) (by C-G formula based on SCr of 8.03 mg/dL (H)). Liver Function Tests: Recent Labs  Lab 12/24/18 0418 12/28/18 0307 12/29/18 0446 12/30/18 0237  AST 196* 73* 49* 41  ALT 96* 34 17 12  ALKPHOS 48 87 73 84  BILITOT 0.4 0.5 0.3 0.5  PROT 4.6* 5.0* 4.9* 5.1*  ALBUMIN 2.0* 2.0* 2.0* 2.1*  2.1*   No results for input(s): LIPASE, AMYLASE in the last 168 hours. No results for input(s): AMMONIA in the last 168 hours. Coagulation Profile:  No results for input(s): INR, PROTIME in the last 168 hours. Cardiac Enzymes: Recent Labs  Lab 12/24/18 0418 12/26/18 0347 12/28/18 0307 12/29/18 0446  CKTOTAL 8,634* 3,103* 1,446* 984*   BNP (last 3 results) No results for input(s): PROBNP in the last 8760 hours. HbA1C: Recent Labs    12/29/18 0446  HGBA1C 5.2   CBG: Recent Labs  Lab 12/28/18 2202 12/29/18 0814 12/29/18 1216 12/29/18 1658 12/30/18 0820  GLUCAP 80 84 120* 75 99   Lipid Profile: No results for input(s): CHOL, HDL, LDLCALC, TRIG, CHOLHDL, LDLDIRECT in the last 72 hours. Thyroid Function Tests: No results for input(s): TSH, T4TOTAL, FREET4, T3FREE, THYROIDAB in the last 72 hours. Anemia Panel: No results for input(s): VITAMINB12, FOLATE, FERRITIN, TIBC, IRON, RETICCTPCT in the last 72 hours. Sepsis Labs: No results for input(s): PROCALCITON, LATICACIDVEN in the last 168 hours.  Recent Results (from the past 240 hour(s))  Culture, blood (routine x 2)     Status: Abnormal   Collection Time: 12/25/18 10:00 AM  Result Value Ref Range Status   Specimen Description BLOOD LEFT HAND  Final   Special Requests AEROBIC BOTTLE ONLY Blood Culture adequate volume  Final   Culture  Setup Time   Final    GRAM POSITIVE COCCI IN CLUSTERS AEROBIC BOTTLE ONLY CRITICAL VALUE NOTED.  VALUE IS CONSISTENT WITH PREVIOUSLY REPORTED AND CALLED VALUE.    Culture (A)  Final    STAPHYLOCOCCUS AUREUS SUSCEPTIBILITIES PERFORMED ON PREVIOUS CULTURE WITHIN THE LAST 5 DAYS. Performed at [facility], [place] [address], [place], [place] [address]    Report Status 12/28/2018 FINAL  Final  Culture, blood (routine x 2)     Status: Abnormal   Collection Time: 12/25/18 10:04 AM  Result Value Ref Range Status   Specimen Description BLOOD LEFT HAND  Final   Special Requests AEROBIC BOTTLE ONLY Blood Culture adequate volume  Final   Culture  Setup Time   Final    GRAM POSITIVE COCCI IN CLUSTERS AEROBIC BOTTLE ONLY CRITICAL RESULT  CALLED TO, READ BACK BY AND VERIFIED WITH: Salli Real [id number] 12/26/2018 Mena Goes Performed at [facility], [place] [address] [place], [place] [address]    Culture STAPHYLOCOCCUS AUREUS (A)  Final   Report Status 12/28/2018 FINAL  Final   Organism ID, Bacteria STAPHYLOCOCCUS AUREUS  Final      Susceptibility   Staphylococcus aureus - MIC*    CIPROFLOXACIN <=0.5 SENSITIVE Sensitive     ERYTHROMYCIN <=0.25  SENSITIVE Sensitive     GENTAMICIN <=0.5 SENSITIVE Sensitive     OXACILLIN 0.5 SENSITIVE Sensitive     TETRACYCLINE <=1 SENSITIVE Sensitive     VANCOMYCIN <=0.5 SENSITIVE Sensitive     TRIMETH/SULFA <=10 SENSITIVE Sensitive     CLINDAMYCIN <=0.25 SENSITIVE Sensitive     RIFAMPIN <=0.5 SENSITIVE Sensitive     Inducible Clindamycin NEGATIVE Sensitive     * STAPHYLOCOCCUS AUREUS  Blood Culture ID Panel (Reflexed)     Status: Abnormal   Collection Time: 12/25/18 10:04 AM  Result Value Ref Range Status   Enterococcus species NOT DETECTED NOT DETECTED Final   Listeria monocytogenes NOT DETECTED NOT DETECTED Final   Staphylococcus species DETECTED (A) NOT DETECTED Final    Comment: CRITICAL RESULT CALLED TO, READ BACK BY AND VERIFIED WITH: G. ABBOTT,PHARMD [id number] 12/26/2018 T. TYSOR    Staphylococcus aureus (BCID) DETECTED (A) NOT DETECTED Final    Comment: Methicillin (oxacillin) susceptible Staphylococcus aureus (MSSA). Preferred therapy is anti staphylococcal beta lactam antibiotic (Cefazolin or Nafcillin), unless clinically contraindicated. CRITICAL RESULT CALLED TO, READ BACK BY AND VERIFIED WITH: G. ABBOTT,PHARMD [id number] 12/26/2018 T. TYSOR    Methicillin resistance NOT DETECTED NOT DETECTED Final   Streptococcus species NOT DETECTED NOT DETECTED Final   Streptococcus agalactiae NOT DETECTED NOT DETECTED Final   Streptococcus pneumoniae NOT DETECTED NOT DETECTED Final   Streptococcus pyogenes NOT DETECTED NOT DETECTED Final   Acinetobacter baumannii NOT DETECTED NOT DETECTED  Final   Enterobacteriaceae species NOT DETECTED NOT DETECTED Final   Enterobacter cloacae complex NOT DETECTED NOT DETECTED Final   Escherichia coli NOT DETECTED NOT DETECTED Final   Klebsiella oxytoca NOT DETECTED NOT DETECTED Final   Klebsiella pneumoniae NOT DETECTED NOT DETECTED Final   Proteus species NOT DETECTED NOT DETECTED Final   Serratia marcescens NOT DETECTED NOT DETECTED Final   Haemophilus influenzae NOT DETECTED NOT DETECTED Final   Neisseria meningitidis NOT DETECTED NOT DETECTED Final   Pseudomonas aeruginosa NOT DETECTED NOT DETECTED Final   Candida albicans NOT DETECTED NOT DETECTED Final   Candida glabrata NOT DETECTED NOT DETECTED Final   Candida krusei NOT DETECTED NOT DETECTED Final   Candida parapsilosis NOT DETECTED NOT DETECTED Final   Candida tropicalis NOT DETECTED NOT DETECTED Final    Comment: Performed at [facility], [place]. [address] [place], [place] [address]  Culture, Urine     Status: Abnormal   Collection Time: 12/25/18  5:00 PM  Result Value Ref Range Status   Specimen Description URINE, CLEAN CATCH  Final   Special Requests   Final    NONE Performed at [facility], [place] [address] [place], [place] [address]    Culture >=100,000 COLONIES/mL STAPHYLOCOCCUS EPIDERMIDIS (A)  Final   Report Status 12/27/2018 FINAL  Final   Organism ID, Bacteria STAPHYLOCOCCUS EPIDERMIDIS (A)  Final      Susceptibility   Staphylococcus epidermidis - MIC*    CIPROFLOXACIN >=8 RESISTANT Resistant     GENTAMICIN <=0.5 SENSITIVE Sensitive     NITROFURANTOIN <=16 SENSITIVE Sensitive     OXACILLIN >=4 RESISTANT Resistant     TETRACYCLINE <=1 SENSITIVE Sensitive     VANCOMYCIN 2 SENSITIVE Sensitive     TRIMETH/SULFA <=10 SENSITIVE Sensitive     CLINDAMYCIN <=0.25 SENSITIVE Sensitive     RIFAMPIN <=0.5 SENSITIVE Sensitive     Inducible Clindamycin NEGATIVE Sensitive     * >=100,000 COLONIES/mL STAPHYLOCOCCUS EPIDERMIDIS  Culture, blood (routine x 2)  Status: None (Preliminary result)   Collection Time: 12/27/18  7:35 AM  Result Value Ref Range Status   Specimen Description BLOOD LEFT HAND  Final   Special Requests   Final    BOTTLES DRAWN AEROBIC ONLY Blood Culture adequate volume   Culture   Final    NO GROWTH 3 DAYS Performed at [facility], [address] [place], [place] [address]    Report Status PENDING  Incomplete  Culture, blood (routine x 2)     Status: None (Preliminary result)   Collection Time: 12/27/18  7:37 AM  Result Value Ref Range Status   Specimen Description BLOOD LEFT WRIST  Final   Special Requests   Final    BOTTLES DRAWN AEROBIC ONLY Blood Culture adequate volume   Culture   Final    NO GROWTH 3 DAYS Performed at [facility], [address] [place], [place] [address]    Report Status PENDING  Incomplete  Culture, blood (Routine X 2) w Reflex to ID Panel     Status: None (Preliminary result)   Collection Time: 12/28/18 10:00 AM  Result Value Ref Range Status   Specimen Description BLOOD LEFT HAND  Final   Special Requests   Final    BOTTLES DRAWN AEROBIC AND ANAEROBIC Blood Culture adequate volume   Culture   Final    NO GROWTH 2 DAYS Performed at [facility], [place] [address], [place], [place] [address]    Report Status PENDING  Incomplete  Culture, blood (Routine X 2) w Reflex to ID Panel     Status: None (Preliminary result)   Collection Time: 12/28/18 10:15 AM  Result Value Ref Range Status   Specimen Description BLOOD LEFT HAND  Final   Special Requests   Final    BOTTLES DRAWN AEROBIC AND ANAEROBIC Blood Culture adequate volume   Culture   Final    NO GROWTH 2 DAYS Performed at [facility], [place] [address] [place], [place] [address]    Report Status PENDING  Incomplete         Radiology Studies: No results found.      Scheduled Meds: . amLODipine  10 mg Oral Daily  . feeding supplement (ENSURE ENLIVE)  237 mL Oral BID BM  . FLUoxetine  40 mg Oral  Daily  . folic acid  1 mg Oral Daily  . heparin  5,000 Units Subcutaneous Q8H  . insulin aspart  0-5 Units Subcutaneous QHS  . insulin aspart  0-9 Units Subcutaneous TID WC  . loxapine  25 mg Oral Daily  . loxapine  50 mg Oral QHS  . metoprolol tartrate  25 mg Oral BID  . polyethylene glycol  17 g Oral Daily  . risperiDONE  9 mg Oral QHS  . senna-docusate  1 tablet Oral BID  . thiamine  100 mg Oral Daily  . topiramate  250 mg Oral QHS  . traZODone  300 mg Oral QHS   Continuous Infusions: . sodium chloride    . sodium chloride    . sodium chloride Stopped (12/26/18 0534)  .  ceFAZolin (ANCEF) IV 1 g (12/29/18 2043)     LOS: 11 days    Time spent: 35 minutes    Irine Seal, MD [facility]  If 7PM-7AM, please contact night-coverage [email or web address] 12/30/2018, 11:08 AM

## 2018-12-30 NOTE — Progress Notes (Signed)
Physical Therapy Treatment Patient Details Name: James [name] MRN: [id number] DOB: 23-Jun-1976 Today's Date: 12/30/2018    History of Present Illness Pt is a 43 y/o male admitted after fall. Found to have acute renal failure secondary to rhabdomyolysis. Pt is s/p tunneled HD catheter and started HD on 4/7. PMH includes DM, bipolar disorder, HTN, and alcohol abuse.     PT Comments    Pt progressing towards physical therapy goals. At beginning of session, pt picked scab on L knee and had significant bleeding from a very small spot. Pink foam applied and bleeding controlled by end of session. Pt was able to progress to no AD with occasional min guard assist provided for safety. Will continue to follow.     Follow Up Recommendations  Home health PT;Supervision for mobility/OOB     Equipment Recommendations  None recommended by PT    Recommendations for Other Services       Precautions / Restrictions Precautions Precautions: Fall Restrictions Weight Bearing Restrictions: No    Mobility  Bed Mobility Overal bed mobility: Modified Independent Bed Mobility: Supine to Sit     Supine to sit: Modified independent (Device/Increase time)     General bed mobility comments: HOB slightly elevated. No assist but apepared effortful.   Transfers Overall transfer level: Needs assistance Equipment used: Rolling walker (2 wheeled) Transfers: Sit to/from Stand Sit to Stand: Supervision         General transfer comment: Increased time but no assist required.   Ambulation/Gait Ambulation/Gait assistance: Supervision;Min guard Gait Distance (Feet): 450 Feet Assistive device: None Gait Pattern/deviations: Step-through pattern;Decreased stride length;Wide base of support Gait velocity: Decreased  Gait velocity interpretation: 1.31 - 2.62 ft/sec, indicative of limited community ambulator General Gait Details: No AD this session. Pt overall ambulating well without UE support.  Occasional min guard assist for safety but grossly supervision.   Stairs             Wheelchair Mobility    Modified Rankin (Stroke Patients Only)       Balance Overall balance assessment: Needs assistance Sitting-balance support: No upper extremity supported;Feet supported Sitting balance-Leahy Scale: Good     Standing balance support: Bilateral upper extremity supported;During functional activity Standing balance-Leahy Scale: Poor Standing balance comment: walker and supervision for static standing                            Cognition Arousal/Alertness: Awake/alert Behavior During Therapy: Flat affect Overall Cognitive Status: Within Functional Limits for tasks assessed                                 General Comments: increased time for processing      Exercises      General Comments        Pertinent Vitals/Pain Pain Assessment: No/denies pain    Home Living                      Prior Function            PT Goals (current goals can now be found in the care plan section) Acute Rehab PT Goals Patient Stated Goal: to get better PT Goal Formulation: With patient Time For Goal Achievement: 01/06/19 Potential to Achieve Goals: Good Progress towards PT goals: Progressing toward goals    Frequency    Min 3X/week  PT Plan Current plan remains appropriate    Co-evaluation              AM-PAC PT "6 Clicks" Mobility   Outcome Measure  Help needed turning from your back to your side while in a flat bed without using bedrails?: None Help needed moving from lying on your back to sitting on the side of a flat bed without using bedrails?: None Help needed moving to and from a bed to a chair (including a wheelchair)?: A Little Help needed standing up from a chair using your arms (e.g., wheelchair or bedside chair)?: A Little Help needed to walk in hospital room?: A Little Help needed climbing 3-5 steps with  a railing? : A Little 6 Click Score: 20    End of Session Equipment Utilized During Treatment: Gait belt Activity Tolerance: Patient tolerated treatment well Patient left: with call bell/phone within reach;in chair Nurse Communication: Mobility status PT Visit Diagnosis: Other abnormalities of gait and mobility (R26.89);Unsteadiness on feet (R26.81);Muscle weakness (generalized) (M62.81)     Time: 2263-3354 PT Time Calculation (min) (ACUTE ONLY): 25 min  Charges:  $Gait Training: 23-37 mins                     Rolinda Roan, PT, DPT Acute Rehabilitation Services Pager: [phone] Office: [phone]    Thelma Comp 12/30/2018, 1:33 PM

## 2018-12-30 NOTE — Progress Notes (Signed)
[facility] for Infectious Disease  Date of Admission:  12/19/2018     Total days of antibiotics 5 Cefazolin            Patient ID: James [name] is a 43 y.o. male with  Principal Problem:   MSSA bacteremia Active Problems:   Diabetes mellitus ([place])   Bipolar affective disorder ([place])   ARF (acute renal failure) ([place])   Alcoholism ([place])   AKI (acute kidney injury) ([place])   Hemodialysis catheter infection ([place])   Traumatic rhabdomyolysis ([place])   . amLODipine  10 mg Oral Daily  . Chlorhexidine Gluconate Cloth  6 each Topical Q0600  . feeding supplement (ENSURE ENLIVE)  237 mL Oral BID BM  . FLUoxetine  40 mg Oral Daily  . folic acid  1 mg Oral Daily  . heparin  5,000 Units Subcutaneous Q8H  . insulin aspart  0-5 Units Subcutaneous QHS  . insulin aspart  0-9 Units Subcutaneous TID WC  . loxapine  25 mg Oral Daily  . loxapine  50 mg Oral QHS  . metoprolol tartrate  25 mg Oral BID  . polyethylene glycol  17 g Oral Daily  . risperiDONE  9 mg Oral QHS  . senna-docusate  1 tablet Oral BID  . thiamine  100 mg Oral Daily  . topiramate  250 mg Oral QHS  . traZODone  300 mg Oral QHS    SUBJECTIVE: Feels swollen.   Review of Systems: Review of Systems  Constitutional: Negative for fever and malaise/fatigue.  Respiratory: Negative for shortness of breath.   Cardiovascular: Positive for leg swelling. Negative for chest pain.       Large scrotum.   Gastrointestinal: Negative for abdominal pain, diarrhea and vomiting.  Genitourinary:       Oliguric  Musculoskeletal: Positive for myalgias (improving).  Skin: Negative for rash.  Neurological: Positive for weakness (generalized).  All other systems reviewed and are negative.   No Known Allergies  OBJECTIVE: Vitals:   12/29/18 1503 12/29/18 2149 12/30/18 0500 12/30/18 0519  BP: (!) 152/86 (!) 142/83  139/88  Pulse: 66 70  64  Resp: (!) 23 16  18   Temp: 98.7 F (37.1 C) 98.6 F (37 C)  98.6 F (37  C)  TempSrc: Oral Oral  Oral  SpO2: 94% 95%  98%  Weight:   122.8 kg   Height:       Body mass index is 33.84 kg/m.   Physical Exam Constitutional:      Comments: Resting comfortably in bed. No distress.   HENT:     Mouth/Throat:     Mouth: Mucous membranes are moist.     Pharynx: Oropharynx is clear.  Eyes:     General: No scleral icterus.    Pupils: Pupils are equal, round, and reactive to light.  Neck:     Comments: Bulky dressing to neck. Clean and dry.  Cardiovascular:     Rate and Rhythm: Normal rate.     Pulses: Normal pulses.     Heart sounds: Murmur (LUSB 1-2/6) present.  Pulmonary:     Effort: Pulmonary effort is normal.     Breath sounds: Normal breath sounds.  Abdominal:     General: Abdomen is flat.  Musculoskeletal:        General: Swelling (generalized) present.  Skin:    General: Skin is warm and dry.     Capillary Refill: Capillary refill takes less than 2 seconds.  Comments: Multiple areas of scabbed over abrasions as previously documented. Remains generally edematous.   Neurological:     Mental Status: He is oriented to person, place, and time.     Lab Results Lab Results  Component Value Date   WBC 7.1 12/30/2018   HGB 8.3 (L) 12/30/2018   HCT 25.4 (L) 12/30/2018   MCV 91.4 12/30/2018   PLT 326 12/30/2018    Lab Results  Component Value Date   CREATININE 8.03 (H) 12/30/2018   BUN 58 (H) 12/30/2018   NA 128 (L) 12/30/2018   K 4.7 12/30/2018   CL 92 (L) 12/30/2018   CO2 24 12/30/2018    Lab Results  Component Value Date   ALT 12 12/30/2018   AST 41 12/30/2018   ALKPHOS 84 12/30/2018   BILITOT 0.5 12/30/2018     Microbiology: BCx 4/12 >> MSSA 2/2 sets BCx 4/14 >> pending (line still in) BCx 4/15 >> not yet drawn   ASSESSMENT & PLAN:   1. MSSA Bacteremia = TTE negative for IE and only mild MV regurg and trivial TV regurg; he has a bit of a more prominent systolic murmur today compared to previous exams, may be due to fluid  with holding HD. Possible for entry with multiple skin wounds however he was not febrile/bacteremic until well into his hospital stay.  Exam otherwise unrevealing for cause - likely related to HD line.  Repeated blood cultures remain negative and no other signs of metastatic infection.  Would continue to treat with Cefazolin IV x 14 days from line removal (through 4/28) barring no blood cultures turn positive in the interval. Uncertain as to what the HD schedule will be - if he is not put on a routine schedule at discharge he will likely need a second line tunneled to administer a daily infusion of cefazolin to complete therapy.  2. AKI with Acute HD Need = urine output picking up. CK improving. Nephrology is following closely and assessing daily for HD needs, hopeful for long term recovery but anticipated to be slow. Adjust abx intervals as indicated with renal recovery.   3. ?Early Cirrhosis = hep b antigen negative. Hep c Ab negative. Likely due to fatty liver disease and excessive/frequent alcohol consumption.   Janene Madeira, MSN, NP-C [facility] for Infectious [place] Cell: [phone] Pager: [phone]  12/30/2018  12:04 PM

## 2018-12-30 NOTE — Progress Notes (Signed)
Occupational Therapy Treatment Patient Details Name: James [name] MRN: [id number] DOB: 03-23-1976 Today's Date: 12/30/2018    History of present illness Pt is a 43 y/o male admitted after fall. Found to have acute renal failure secondary to rhabdomyolysis. Pt is s/p tunneled HD catheter and started HD on 4/7. PMH includes DM, bipolar disorder, HTN, and alcohol abuse.    OT comments  Pt progressing well with OT as pt standing at sink for grooming tasks with supervisionA. Pt ambulating steadily in room with no AD and occasional minguardA. Pt education on LB dressing with hip kit due to increased swelling. Pt able to perform LB dressing with modified indpendence using AE and no pain reported and pt with good carry over skills. Pt would greatly benefit from continued OT skilled services for ADL, mobility and energy conservation. OT to follow acutely.     Follow Up Recommendations  Home health OT;Supervision - Intermittent    Equipment Recommendations  None recommended by OT    Recommendations for Other Services      Precautions / Restrictions Precautions Precautions: Fall Restrictions Weight Bearing Restrictions: No       Mobility Bed Mobility Overal bed mobility: Modified Independent Bed Mobility: Supine to Sit     Supine to sit: Modified independent (Device/Increase time)     General bed mobility comments: in recliner upon arrival  Transfers Overall transfer level: Needs assistance Equipment used: Rolling walker (2 wheeled) Transfers: Sit to/from Stand Sit to Stand: Supervision         General transfer comment: Pt rocking back and forth for momentum    Balance Overall balance assessment: Needs assistance Sitting-balance support: No upper extremity supported;Feet supported Sitting balance-Leahy Scale: Good     Standing balance support: Bilateral upper extremity supported;During functional activity Standing balance-Leahy Scale: Poor Standing balance  comment: walker and supervision for static standing                           ADL either performed or assessed with clinical judgement   ADL Overall ADL's : Needs assistance/impaired     Grooming: Set up;Standing                               Functional mobility during ADLs: Min guard General ADL Comments: pt limited secondary to reports of increased pain and swelling limiting his participation.     Vision   Vision Assessment?: No apparent visual deficits   Perception     Praxis      Cognition Arousal/Alertness: Awake/alert Behavior During Therapy: Flat affect Overall Cognitive Status: Within Functional Limits for tasks assessed                                 General Comments: increased time for processing        Exercises     Shoulder Instructions       General Comments      Pertinent Vitals/ Pain       Pain Assessment: No/denies pain  Home Living                                          Prior Functioning/Environment  Frequency  Min 2X/week        Progress Toward Goals  OT Goals(current goals can now be found in the care plan section)  Progress towards OT goals: Progressing toward goals  Acute Rehab OT Goals Patient Stated Goal: to get better OT Goal Formulation: With patient Time For Goal Achievement: 01/07/19 Potential to Achieve Goals: Good ADL Goals Pt Will Perform Grooming: Independently Pt Will Perform Upper Body Dressing: Independently Pt Will Perform Lower Body Dressing: Independently Pt Will Transfer to Toilet: Independently;ambulating  Plan Discharge plan remains appropriate    Co-evaluation                 AM-PAC OT "6 Clicks" Daily Activity     Outcome Measure   Help from another person eating meals?: None Help from another person taking care of personal grooming?: None Help from another person toileting, which includes using toliet, bedpan, or  urinal?: A Little Help from another person bathing (including washing, rinsing, drying)?: A Little Help from another person to put on and taking off regular upper body clothing?: A Little Help from another person to put on and taking off regular lower body clothing?: A Lot 6 Click Score: 19    End of Session Equipment Utilized During Treatment: Gait belt;Rolling walker  OT Visit Diagnosis: Unsteadiness on feet (R26.81);Other abnormalities of gait and mobility (R26.89) Pain - part of body: Knee   Activity Tolerance Patient limited by pain   Patient Left in chair;with call bell/phone within reach   Nurse Communication Mobility status        Time: 0941-0956 OT Time Calculation (min): 15 min  Charges: OT General Charges $OT Visit: 1 Visit OT Treatments $Self Care/Home Management : 8-22 mins   ([place])  OTR/L Acute Rehabilitation Services Pager: [phone] Office: [phone]     J  12/30/2018, 2:15 PM    

## 2018-12-30 NOTE — Progress Notes (Signed)
Patient ID: James [name], male   DOB: Jan 27, 1976, 43 y.o.   MRN: [id number]   Tentative temp cath today dependent on Cr  Cr from 6.8 to 8.03 Will move ahead with temp cath today

## 2018-12-30 NOTE — Progress Notes (Signed)
Patient still off unit for HD. This RN assuming care. Will continue to monitor.

## 2018-12-30 NOTE — Progress Notes (Signed)
Patient ID: James [name], male   DOB: [birth year], 43 y.o.   MRN: [id number] [facility] Progress Note   Assessment/ Plan:   1. Acute kidney Injury: Oliguric and without any evidence of renal recovery from labs this morning, on schedule for temporary hemodialysis catheter placement by IR.  Will order for hemodialysis today with ultrafiltration for volume unloading.  I am optimistic for renal recovery but this may be slow and protracted based on severity of injury. 2.  Volume overload: We will continue patient's ultrafiltration with hemodialysis so as not to interfere with renal recovery, he is restricting oral fluids. 3.  Bipolar disorder/anxiety: Ongoing management per primary service. 4.  History of alcohol abuse: We will continue to monitor closely for withdrawal symptoms, initial ultrasound suggestive of early development of cirrhosis (suspect fatty liver has a contributory effect). 5.  Hyponatremia: Secondary to acute kidney injury and unrestricted oral fluid intake-reeducated on fluid restriction. 6. MSSA bacteremia: On intravenous cefazolin and currently catheter holiday with negative cultures to date.  New dialysis catheter today. Subjective:   Complains of scrotal pain from swelling.   Objective:   BP 139/88 (BP Location: Left Arm)   Pulse 64   Temp 98.6 F (37 C) (Oral)   Resp 18   Ht 6\' 3"  (1.905 m)   Wt 122.8 kg   SpO2 98%   BMI 33.84 kg/m   Intake/Output Summary (Last 24 hours) at 12/30/2018 1111 Last data filed at 12/30/2018 0835 Gross per 24 hour  Intake 410 ml  Output 300 ml  Net 110 ml   Weight change:   Physical Exam: Gen: Resting comfortably in bed, watching television CVS: Pulse regular rhythm, normal rate, S1 and S2 normal Resp: Decreased breath sounds over bases, no rales Abd: Soft, obese, nontender Ext: 2-3+ pedal edema, scrotal edema noted.  Imaging: No results found.  Labs: BMET Recent Labs  Lab 12/24/18 0418 12/26/18 0347  12/27/18 0505 12/28/18 0307 12/29/18 0446 12/30/18 0237  NA 129* 127* 129* 132* 131* 128*  K 4.0 5.7* 4.6 4.3 4.4 4.7  CL 92* 91* 92* 95* 94* 92*  CO2 23 23 26 27 25 24   GLUCOSE 82 98 88 88 88 82  BUN 38* 46* 41* 31* 48* 58*  CREATININE 6.96* 7.80* 6.26* 5.07* 6.82* 8.03*  CALCIUM 8.5* 8.5* 8.2* 8.1* 8.2* 8.2*  PHOS  --   --   --   --   --  5.5*   CBC Recent Labs  Lab 12/26/18 0347 12/27/18 0505 12/28/18 0307 12/29/18 0446 12/30/18 0237  WBC 10.5 9.0 8.0 6.6 7.1  NEUTROABS 9.0* 6.9 5.7  --  4.6  HGB 9.0* 8.2* 8.5* 8.5* 8.3*  HCT 26.6* 24.6* 25.0* 25.7* 25.4*  MCV 92.7 93.9 91.6 91.5 91.4  PLT 253 242 272 291 326    Medications:    . amLODipine  10 mg Oral Daily  . feeding supplement (ENSURE ENLIVE)  237 mL Oral BID BM  . FLUoxetine  40 mg Oral Daily  . folic acid  1 mg Oral Daily  . heparin  5,000 Units Subcutaneous Q8H  . insulin aspart  0-5 Units Subcutaneous QHS  . insulin aspart  0-9 Units Subcutaneous TID WC  . loxapine  25 mg Oral Daily  . loxapine  50 mg Oral QHS  . metoprolol tartrate  25 mg Oral BID  . polyethylene glycol  17 g Oral Daily  . risperiDONE  9 mg Oral QHS  . senna-docusate  1 tablet  Oral BID  . thiamine  100 mg Oral Daily  . topiramate  250 mg Oral QHS  . traZODone  300 mg Oral QHS   Elmarie Shiley, MD 12/30/2018, 11:11 AM

## 2018-12-30 NOTE — Procedures (Signed)
Interventional Radiology Procedure:   Indications: Acute renal failure  Procedure: Placement of non-tunneled dialysis catheter  Findings: Focal thrombus in right IJ at prior catheter site. Patent left IJ.  20 cm [place] placed, tip at SVC/RA junction.  Complications: None     EBL: less than 10 ml  Plan: Catheter is ready to use.   Informed Dr. Posey Pronto of right IJ thrombus.     Einer Meals R. Anselm Pancoast, MD  Pager: [phone]

## 2018-12-31 ENCOUNTER — Inpatient Hospital Stay (HOSPITAL_COMMUNITY): Payer: Medicare HMO

## 2018-12-31 LAB — CBC WITH DIFFERENTIAL/PLATELET
Abs Immature Granulocytes: 0.23 10*3/uL — ABNORMAL HIGH (ref 0.00–0.07)
Basophils Absolute: 0 10*3/uL (ref 0.0–0.1)
Basophils Relative: 1 %
Eosinophils Absolute: 0.2 10*3/uL (ref 0.0–0.5)
Eosinophils Relative: 3 %
HCT: 25.3 % — ABNORMAL LOW (ref 39.0–52.0)
Hemoglobin: 8.4 g/dL — ABNORMAL LOW (ref 13.0–17.0)
Immature Granulocytes: 3 %
Lymphocytes Relative: 16 %
Lymphs Abs: 1.3 10*3/uL (ref 0.7–4.0)
MCH: 29.9 pg (ref 26.0–34.0)
MCHC: 33.2 g/dL (ref 30.0–36.0)
MCV: 90 fL (ref 80.0–100.0)
Monocytes Absolute: 0.7 10*3/uL (ref 0.1–1.0)
Monocytes Relative: 9 %
Neutro Abs: 5.3 10*3/uL (ref 1.7–7.7)
Neutrophils Relative %: 68 %
Platelets: 345 10*3/uL (ref 150–400)
RBC: 2.81 MIL/uL — ABNORMAL LOW (ref 4.22–5.81)
RDW: 11.9 % (ref 11.5–15.5)
WBC: 7.8 10*3/uL (ref 4.0–10.5)
nRBC: 0 % (ref 0.0–0.2)

## 2018-12-31 LAB — GLUCOSE, CAPILLARY
Glucose-Capillary: 80 mg/dL (ref 70–99)
Glucose-Capillary: 95 mg/dL (ref 70–99)
Glucose-Capillary: 110 mg/dL — ABNORMAL HIGH (ref 70–99)

## 2018-12-31 MED ORDER — SODIUM CHLORIDE 0.9% FLUSH: 10.0000 mL | INTRAVENOUS | Status: DC | PRN

## 2018-12-31 MED ORDER — CEFAZOLIN SODIUM-DEXTROSE 1-4 GM/50ML-% IV SOLN
1.0000 g | Freq: Once | INTRAVENOUS | Status: AC
  Administered 2018-12-31: 1 g via INTRAVENOUS
  Filled 2018-12-31: qty 50

## 2018-12-31 MED ORDER — HEPARIN SODIUM (PORCINE) 1000 UNIT/ML DIALYSIS
40.0000 [IU]/kg | INTRAMUSCULAR | Status: DC | PRN
  Administered 2019-01-03: 11:00:00 4800 [IU] via INTRAVENOUS_CENTRAL
  Filled 2018-12-31 (×2): qty 5

## 2018-12-31 NOTE — Progress Notes (Signed)
PROGRESS NOTE    James [name]  GYF:[id number] DOB: 17-May-1976 DOA: 12/19/2018 PCP: No primary care provider on file.    Brief Narrative:  43 year old male with history of hypertension, diabetes mellitus type 2, bipolar disorder presented on 12/17/2018 to [facility] after being found down for 2 days following binge alcohol drinking.  He was found to have AKI, metabolic acidosis, leukocytosis and LFT elevation with rhabdomyolysis, CK of 58,000.  He was treated with aggressive IV fluids but unfortunately his renal function got worse with decreased urine output and CK rose to 130,000.  No improvement with IV diuresis.  He was transferred to [facility] on 12/19/2018.  Nephrology was consulted.  Tunneled HD catheter was inserted and hemodialysis was started on 12/20/2018.  HD catheter subsequently removed 12/27/2018.  Patient also noted to have a MSSA bacteremia.  ID consulted.   Assessment & Plan:   Principal Problem:   MSSA bacteremia Active Problems:   Diabetes mellitus ([place])   Bipolar affective disorder ([place])   ARF (acute renal failure) ([place])   Alcoholism ([place])   AKI (acute kidney injury) ([place])   Hemodialysis catheter infection ([place])   Traumatic rhabdomyolysis ([place])  1 MSSA bacteremia Questionable etiology.  Concern for possible line infection.  2D echo which was done was negative for infective endocarditis and only mild mitral valvular regurgitation and trivial tricuspid valvular regurgitation.  TEE was unable to be done at this time.  HD catheter line was removed last night 12/27/2018.  Repeat blood cultures have been ordered per ID which are currently pending.  Blood cultures from 12/27/2018 with no growth to date.  ID recommending treating with IV cefazolin x14 days from line removal with day 1 either 12/27/2018 or 12/28/2018 with treatment through 01/10/2019.  ID following and appreciate input and recommendations.  2.  Acute renal failure Felt likely secondary to  rhabdomyolysis plus or minus ATN.  Patient had a HD catheter line placed and underwent hemodialysis on Monday and Tuesday followed by removal of dialysis catheter due to concerns might be etiology of MSSA bacteremia.  Patient urine output of 250 cc over the past 24 hours which was recorded.  Creatinine trending back up and currently at 5.11 from 8.03 from 6.82 from 5.07 from 6.26 from 7.80.  Per nephrology temporary dialysis catheter was placed on 12/30/2018 and patient resumed back on hemodialysis.  Per nephrology.   3.  Rhabdomyolysis Improving daily.  Patient noted to have initial presentation with a CK level of 58,000 and treated aggressively with IV fluids however renal function worsened with decreased urine output and worsening CK of 130,000.  Rhabdomyolysis initially was not improving with diuresis. Patient subsequently underwent hemodialysis.  CK levels have trended down and currently at 984. Follow.  4.  Volume overload Improved with hemodialysis initially.  HD catheter has been removed.  Patient still with volume overload noted on examination with scrotal swelling and some lower extremity edema.  Placement of non-tunneled dialysis catheter was done 12/30/2018.  Patient started back up on hemodialysis. Monitor urine output.  Follow.  5.  Alcohol abuse No signs of alcohol withdrawal noted.  Was on the Librium tapering doses which has been discontinued.  Alcohol cessation stressed to patient.  Follow.  6.  Transaminitis Felt secondary to rhabdomyolysis and alcohol use.  Acute hepatitis panel negative.  HIV nonreactive.  Right upper quadrant ultrasound with early cirrhotic changes noted.  LFTs trended down.  Follow.  7.  Hyponatremia Secondary to hypervolemic hyponatremia secondary to acute  kidney injury.  Initially improved with hemodialysis however trending down yesterday and trending back up after patient placed back on hemodialysis.  Status post non-tunneled dialysis catheter 12/30/2018 per  interventional radiology.  Patient started back on hemodialysis.  Per nephrology. Follow.  8.  Diabetes mellitus type 2 Hemoglobin A1c of 5.2.  Oral diabetic medications on hold.  CBG of 80 this morning.  D/C'd sliding scale insulin.   9.  Elevated troponin Secondary to rhabdomyolysis.  No further interventions at this time.  10.  Maculopapular rash Improved.  11.  Obesity  12.  Bipolar disorder/anxiety Stable.  Continue current regimen of Prozac, Risperdal, Topamax.  Trazodone nightly.  13.  Hypertension Continue current regimen of Lopressor and Norvasc.     DVT prophylaxis: Heparin Code Status: Full Family Communication: Updated patient.  No family at bedside. Disposition Plan: Likely home with home health when clinically stable, improvement with renal function, when okay with nephrology and ID.   Consultants:   Infectious disease: Dr. Rhina Brackett [place] 12/26/2018  Nephrology: Dr. Hollie Salk 12/19/2018  Procedures:   Chest x-ray 12/25/2018  Renal ultrasound 12/20/2018  Right IJ catheter placement under ultrasound and fluoroscopic guidance per [place] interventional radiology for 12/20/2018  2D echo 12/26/2018  Placement of non-tunneled dialysis catheter left IJ per Dr. Anselm Pancoast 12/30/2018  Antimicrobials:   IV Ancef 12/26/2018>>>>> 01/10/2019   Subjective: Patient laying in bed.  Denies chest pain or shortness of breath.   Objective: Vitals:   12/31/18 0517 12/31/18 0620 12/31/18 0628 12/31/18 0916  BP: 132/83     Pulse: (!) 57   62  Resp: 18     Temp: 98 F (36.7 C)     TempSrc: Oral     SpO2: 98%     Weight:  119.6 kg 118.8 kg   Height:        Intake/Output Summary (Last 24 hours) at 12/31/2018 1147 Last data filed at 12/31/2018 0917 Gross per 24 hour  Intake 1078 ml  Output 250 ml  Net 828 ml   Filed Weights   12/30/18 1900 12/31/18 0620 12/31/18 0628  Weight: 123.4 kg 119.6 kg 118.8 kg    Examination:  General exam: NAD Respiratory system: Some  bibasilar crackles.  No wheezes, no crackles, no rhonchi.  Normal respiratory effort.  Cardiovascular system: Regular rate rhythm no murmurs rubs or gallops.  No JVD.  2+ bilateral lower extremity edema.  Gastrointestinal system: Abdomen is nontender, nondistended, soft, positive bowel sounds.  No rebound.  No guarding. GU: Scrotal swelling. Central nervous system: Alert and oriented. No focal neurological deficits. Extremities: 2+ BLE edema Symmetric 5 x 5 power. Skin: No rashes, lesions or ulcers Psychiatry: Judgement and insight appear fair. Mood & affect appropriate.     Data Reviewed: I have personally reviewed following labs and imaging studies  CBC: Recent Labs  Lab 12/26/18 0347 12/27/18 0505 12/28/18 0307 12/29/18 0446 12/30/18 0237 12/31/18 0229  WBC 10.5 9.0 8.0 6.6 7.1 7.8  NEUTROABS 9.0* 6.9 5.7  --  4.6 5.3  HGB 9.0* 8.2* 8.5* 8.5* 8.3* 8.4*  HCT 26.6* 24.6* 25.0* 25.7* 25.4* 25.3*  MCV 92.7 93.9 91.6 91.5 91.4 90.0  PLT 253 242 272 291 326 638   Basic Metabolic Panel: Recent Labs  Lab 12/26/18 0347 12/27/18 0505 12/28/18 0307 12/29/18 0446 12/30/18 0237 12/31/18 0229  NA 127* 129* 132* 131* 128* 130*  K 5.7* 4.6 4.3 4.4 4.7 4.0  CL 91* 92* 95* 94* 92* 92*  CO2 23 26 27  25 24 24   GLUCOSE 98 88 88 88 82 80  BUN 46* 41* 31* 48* 58* 31*  CREATININE 7.80* 6.26* 5.07* 6.82* 8.03* 5.11*  CALCIUM 8.5* 8.2* 8.1* 8.2* 8.2* 7.9*  MG 1.9 2.1 2.0  --   --   --   PHOS  --   --   --   --  5.5* 3.8   GFR: Estimated Creatinine Clearance: 25.4 mL/min (A) (by C-G formula based on SCr of 5.11 mg/dL (H)). Liver Function Tests: Recent Labs  Lab 12/28/18 0307 12/29/18 0446 12/30/18 0237 12/31/18 0229  AST 73* 49* 41  --   ALT 34 17 12  --   ALKPHOS 87 73 84  --   BILITOT 0.5 0.3 0.5  --   PROT 5.0* 4.9* 5.1*  --   ALBUMIN 2.0* 2.0* 2.1*   2.1* 2.1*   No results for input(s): LIPASE, AMYLASE in the last 168 hours. No results for input(s): AMMONIA in the last  168 hours. Coagulation Profile: No results for input(s): INR, PROTIME in the last 168 hours. Cardiac Enzymes: Recent Labs  Lab 12/26/18 0347 12/28/18 0307 12/29/18 0446  CKTOTAL 3,103* 1,446* 984*   BNP (last 3 results) No results for input(s): PROBNP in the last 8760 hours. HbA1C: Recent Labs    12/29/18 0446  HGBA1C 5.2   CBG: Recent Labs  Lab 12/29/18 1658 12/30/18 0820 12/30/18 1206 12/30/18 1656 12/31/18 0757  GLUCAP 75 99 93 71 80   Lipid Profile: No results for input(s): CHOL, HDL, LDLCALC, TRIG, CHOLHDL, LDLDIRECT in the last 72 hours. Thyroid Function Tests: No results for input(s): TSH, T4TOTAL, FREET4, T3FREE, THYROIDAB in the last 72 hours. Anemia Panel: No results for input(s): VITAMINB12, FOLATE, FERRITIN, TIBC, IRON, RETICCTPCT in the last 72 hours. Sepsis Labs: No results for input(s): PROCALCITON, LATICACIDVEN in the last 168 hours.  Recent Results (from the past 240 hour(s))  Culture, blood (routine x 2)     Status: Abnormal   Collection Time: 12/25/18 10:00 AM  Result Value Ref Range Status   Specimen Description BLOOD LEFT HAND  Final   Special Requests AEROBIC BOTTLE ONLY Blood Culture adequate volume  Final   Culture  Setup Time   Final    GRAM POSITIVE COCCI IN CLUSTERS AEROBIC BOTTLE ONLY CRITICAL VALUE NOTED.  VALUE IS CONSISTENT WITH PREVIOUSLY REPORTED AND CALLED VALUE.    Culture (A)  Final    STAPHYLOCOCCUS AUREUS SUSCEPTIBILITIES PERFORMED ON PREVIOUS CULTURE WITHIN THE LAST 5 DAYS. Performed at [facility], [place] [address] [place], [place] [address]    Report Status 12/28/2018 FINAL  Final  Culture, blood (routine x 2)     Status: Abnormal   Collection Time: 12/25/18 10:04 AM  Result Value Ref Range Status   Specimen Description BLOOD LEFT HAND  Final   Special Requests AEROBIC BOTTLE ONLY Blood Culture adequate volume  Final   Culture  Setup Time   Final    GRAM POSITIVE COCCI IN CLUSTERS AEROBIC BOTTLE  ONLY CRITICAL RESULT CALLED TO, READ BACK BY AND VERIFIED WITH: Salli Real [id number] 12/26/2018 Mena Goes Performed at [facility], [place] [address] [place], [place] [address]    Culture STAPHYLOCOCCUS AUREUS (A)  Final   Report Status 12/28/2018 FINAL  Final   Organism ID, Bacteria STAPHYLOCOCCUS AUREUS  Final      Susceptibility   Staphylococcus aureus - MIC*    CIPROFLOXACIN <=0.5 SENSITIVE Sensitive     ERYTHROMYCIN <=0.25 SENSITIVE  Sensitive     GENTAMICIN <=0.5 SENSITIVE Sensitive     OXACILLIN 0.5 SENSITIVE Sensitive     TETRACYCLINE <=1 SENSITIVE Sensitive     VANCOMYCIN <=0.5 SENSITIVE Sensitive     TRIMETH/SULFA <=10 SENSITIVE Sensitive     CLINDAMYCIN <=0.25 SENSITIVE Sensitive     RIFAMPIN <=0.5 SENSITIVE Sensitive     Inducible Clindamycin NEGATIVE Sensitive     * STAPHYLOCOCCUS AUREUS  Blood Culture ID Panel (Reflexed)     Status: Abnormal   Collection Time: 12/25/18 10:04 AM  Result Value Ref Range Status   Enterococcus species NOT DETECTED NOT DETECTED Final   Listeria monocytogenes NOT DETECTED NOT DETECTED Final   Staphylococcus species DETECTED (A) NOT DETECTED Final    Comment: CRITICAL RESULT CALLED TO, READ BACK BY AND VERIFIED WITH: G. ABBOTT,PHARMD [id number] 12/26/2018 T. TYSOR    Staphylococcus aureus (BCID) DETECTED (A) NOT DETECTED Final    Comment: Methicillin (oxacillin) susceptible Staphylococcus aureus (MSSA). Preferred therapy is anti staphylococcal beta lactam antibiotic (Cefazolin or Nafcillin), unless clinically contraindicated. CRITICAL RESULT CALLED TO, READ BACK BY AND VERIFIED WITH: G. ABBOTT,PHARMD [id number] 12/26/2018 T. TYSOR    Methicillin resistance NOT DETECTED NOT DETECTED Final   Streptococcus species NOT DETECTED NOT DETECTED Final   Streptococcus agalactiae NOT DETECTED NOT DETECTED Final   Streptococcus pneumoniae NOT DETECTED NOT DETECTED Final   Streptococcus pyogenes NOT DETECTED NOT DETECTED Final   Acinetobacter baumannii NOT  DETECTED NOT DETECTED Final   Enterobacteriaceae species NOT DETECTED NOT DETECTED Final   Enterobacter cloacae complex NOT DETECTED NOT DETECTED Final   Escherichia coli NOT DETECTED NOT DETECTED Final   Klebsiella oxytoca NOT DETECTED NOT DETECTED Final   Klebsiella pneumoniae NOT DETECTED NOT DETECTED Final   Proteus species NOT DETECTED NOT DETECTED Final   Serratia marcescens NOT DETECTED NOT DETECTED Final   Haemophilus influenzae NOT DETECTED NOT DETECTED Final   Neisseria meningitidis NOT DETECTED NOT DETECTED Final   Pseudomonas aeruginosa NOT DETECTED NOT DETECTED Final   Candida albicans NOT DETECTED NOT DETECTED Final   Candida glabrata NOT DETECTED NOT DETECTED Final   Candida krusei NOT DETECTED NOT DETECTED Final   Candida parapsilosis NOT DETECTED NOT DETECTED Final   Candida tropicalis NOT DETECTED NOT DETECTED Final    Comment: Performed at [facility], [place]. [address], [place], [place] [address]  Culture, Urine     Status: Abnormal   Collection Time: 12/25/18  5:00 PM  Result Value Ref Range Status   Specimen Description URINE, CLEAN CATCH  Final   Special Requests   Final    NONE Performed at [facility], [place] [address], [place], [place] [address]    Culture >=100,000 COLONIES/mL STAPHYLOCOCCUS EPIDERMIDIS (A)  Final   Report Status 12/27/2018 FINAL  Final   Organism ID, Bacteria STAPHYLOCOCCUS EPIDERMIDIS (A)  Final      Susceptibility   Staphylococcus epidermidis - MIC*    CIPROFLOXACIN >=8 RESISTANT Resistant     GENTAMICIN <=0.5 SENSITIVE Sensitive     NITROFURANTOIN <=16 SENSITIVE Sensitive     OXACILLIN >=4 RESISTANT Resistant     TETRACYCLINE <=1 SENSITIVE Sensitive     VANCOMYCIN 2 SENSITIVE Sensitive     TRIMETH/SULFA <=10 SENSITIVE Sensitive     CLINDAMYCIN <=0.25 SENSITIVE Sensitive     RIFAMPIN <=0.5 SENSITIVE Sensitive     Inducible Clindamycin NEGATIVE Sensitive     * >=100,000 COLONIES/mL STAPHYLOCOCCUS EPIDERMIDIS  Culture,  blood (routine x 2)  Status: None (Preliminary result)   Collection Time: 12/27/18  7:35 AM  Result Value Ref Range Status   Specimen Description BLOOD LEFT HAND  Final   Special Requests   Final    BOTTLES DRAWN AEROBIC ONLY Blood Culture adequate volume   Culture   Final    NO GROWTH 4 DAYS Performed at [facility], [place] [address] [place], [place] [address]    Report Status PENDING  Incomplete  Culture, blood (routine x 2)     Status: None (Preliminary result)   Collection Time: 12/27/18  7:37 AM  Result Value Ref Range Status   Specimen Description BLOOD LEFT WRIST  Final   Special Requests   Final    BOTTLES DRAWN AEROBIC ONLY Blood Culture adequate volume   Culture   Final    NO GROWTH 4 DAYS Performed at [facility], [place] [address], [place], [place] [address]    Report Status PENDING  Incomplete  Culture, blood (Routine X 2) w Reflex to ID Panel     Status: None (Preliminary result)   Collection Time: 12/28/18 10:00 AM  Result Value Ref Range Status   Specimen Description BLOOD LEFT HAND  Final   Special Requests   Final    BOTTLES DRAWN AEROBIC AND ANAEROBIC Blood Culture adequate volume   Culture   Final    NO GROWTH 3 DAYS Performed at [facility], [place] [address] [place], [place] [address]    Report Status PENDING  Incomplete  Culture, blood (Routine X 2) w Reflex to ID Panel     Status: None (Preliminary result)   Collection Time: 12/28/18 10:15 AM  Result Value Ref Range Status   Specimen Description BLOOD LEFT HAND  Final   Special Requests   Final    BOTTLES DRAWN AEROBIC AND ANAEROBIC Blood Culture adequate volume   Culture   Final    NO GROWTH 3 DAYS Performed at [facility], [place] [address], [place], [place] [address]    Report Status PENDING  Incomplete         Radiology Studies: Ir Fluoro Guide Cv Line Left  Result Date: 12/30/2018 INDICATION: 43 year old with acute renal failure. Recently removed right jugular  dialysis catheter and needs new temporary access. EXAM: FLUOROSCOPIC AND ULTRASOUND GUIDED PLACEMENT OF A NON-TUNNELED DIALYSIS CATHETER Physician: Stephan Minister. Henn, MD MEDICATIONS: None ANESTHESIA/SEDATION: None FLUOROSCOPY TIME:  Fluoroscopy Time: 42 seconds, 2 mGy COMPLICATIONS: None immediate. PROCEDURE: Informed consent was obtained for catheter placement. The patient was placed supine on the interventional table. Ultrasound demonstrated focal thrombus in the right internal jugular vein at the old catheter site. Therefore, attention was directed to the left side of the neck. Ultrasound confirmed a patent left internal jugular veinvein. Ultrasound images were obtained for documentation. The left side of the neck was prepped and draped in a sterile fashion. The left side of the neck was anesthetized with 1% lidocaine. Maximal barrier sterile technique was utilized including caps, mask, sterile gowns, sterile gloves, sterile drape, hand hygiene and skin antiseptic. A small incision was made with #11 blade scalpel. A 21 gauge needle directed into the left internal jugular vein with ultrasound guidance. A micropuncture dilator set was placed. A 20 cm Mahurkar catheter was selected. The catheter was advanced over a wire and positioned at the superior cavoatrial junction. Fluoroscopic images were obtained for documentation. Both dialysis lumens were found to aspirate and flush well. The proper amount of heparin was  flushed in both lumens. The central venous lumen was flushed with normal saline. Catheter was sutured to skin. FINDINGS: Catheter tip at the superior cavoatrial junction. Focal thrombus in the right internal jugular vein likely from recent catheter. IMPRESSION: Successful placement of a left jugular non-tunneled dialysis catheter using ultrasound and fluoroscopic guidance. Focal thrombus in the right internal jugular vein was discussed with Dr. Posey Pronto in Nephrology. Electronically Signed   By: Markus Daft M.D.    On: 12/30/2018 16:55   Ir US Guide Vasc Access Left  Result Date: 12/30/2018 INDICATION: 43 year old with acute renal failure. Recently removed right jugular dialysis catheter and needs new temporary access. EXAM: FLUOROSCOPIC AND ULTRASOUND GUIDED PLACEMENT OF A NON-TUNNELED DIALYSIS CATHETER Physician: Stephan Minister. Henn, MD MEDICATIONS: None ANESTHESIA/SEDATION: None FLUOROSCOPY TIME:  Fluoroscopy Time: 42 seconds, 2 mGy COMPLICATIONS: None immediate. PROCEDURE: Informed consent was obtained for catheter placement. The patient was placed supine on the interventional table. Ultrasound demonstrated focal thrombus in the right internal jugular vein at the old catheter site. Therefore, attention was directed to the left side of the neck. Ultrasound confirmed a patent left internal jugular veinvein. Ultrasound images were obtained for documentation. The left side of the neck was prepped and draped in a sterile fashion. The left side of the neck was anesthetized with 1% lidocaine. Maximal barrier sterile technique was utilized including caps, mask, sterile gowns, sterile gloves, sterile drape, hand hygiene and skin antiseptic. A small incision was made with #11 blade scalpel. A 21 gauge needle directed into the left internal jugular vein with ultrasound guidance. A micropuncture dilator set was placed. A 20 cm Mahurkar catheter was selected. The catheter was advanced over a wire and positioned at the superior cavoatrial junction. Fluoroscopic images were obtained for documentation. Both dialysis lumens were found to aspirate and flush well. The proper amount of heparin was flushed in both lumens. The central venous lumen was flushed with normal saline. Catheter was sutured to skin. FINDINGS: Catheter tip at the superior cavoatrial junction. Focal thrombus in the right internal jugular vein likely from recent catheter. IMPRESSION: Successful placement of a left jugular non-tunneled dialysis catheter using ultrasound and  fluoroscopic guidance. Focal thrombus in the right internal jugular vein was discussed with Dr. Posey Pronto in Nephrology. Electronically Signed   By: Markus Daft M.D.   On: 12/30/2018 16:55        Scheduled Meds:  amLODipine  10 mg Oral Daily   Chlorhexidine Gluconate Cloth  6 each Topical Q0600   feeding supplement (ENSURE ENLIVE)  237 mL Oral BID BM   FLUoxetine  40 mg Oral Daily   folic acid  1 mg Oral Daily   heparin  5,000 Units Subcutaneous Q8H   loxapine  25 mg Oral Daily   loxapine  50 mg Oral QHS   metoprolol tartrate  25 mg Oral BID   polyethylene glycol  17 g Oral Daily   risperiDONE  9 mg Oral QHS   senna-docusate  1 tablet Oral BID   thiamine  100 mg Oral Daily   topiramate  250 mg Oral QHS   traZODone  300 mg Oral QHS   Continuous Infusions:  sodium chloride     sodium chloride     sodium chloride Stopped (12/26/18 0534)    ceFAZolin (ANCEF) IV Stopped (12/30/18 1900)    ceFAZolin (ANCEF) IV 1 g (12/31/18 1130)     LOS: 12 days    Time spent: 35 minutes    Irine Seal, MD [facility]  If 7PM-7AM, please contact night-coverage [email or web address] 12/31/2018, 11:47 AM

## 2018-12-31 NOTE — Progress Notes (Signed)
Patient ID: James [name], male   DOB: 03-22-1976, 43 y.o.   MRN: [id number] [facility] Progress Note   Assessment/ Plan:   1. Acute kidney Injury: Oliguric and without any evidence of renal recovery from labs this morning, on schedule for temporary hemodialysis catheter placement by IR.  Underwent hemodialysis yesterday without problem, will order for additional dialysis today for volume unloading.  I am optimistic for renal recovery but this may be slow and protracted based on severity of injury. 2.  Volume overload: Remains oliguric, continue HD with UF. 3.  Bipolar disorder/anxiety: Ongoing management per primary service. 4.  History of alcohol abuse: We will continue to monitor closely for withdrawal symptoms, initial ultrasound suggestive of early development of cirrhosis (suspect fatty liver has a contributory effect). 5.  Hyponatremia: Secondary to acute kidney injury and unrestricted oral fluid intake-reeducated on fluid restriction. 6. MSSA bacteremia: On intravenous cefazolin and currently catheter holiday with negative cultures to date.  New dialysis catheter today. 7.  Right IJ thrombus: We will order for Doppler to assess extent of thrombus and make decision regarding possible need for anticoagulation. Subjective:   Underwent hemodialysis yesterday, noted to have right IJ thrombus by IR yesterday.   Objective:   BP 132/83 (BP Location: Right Arm)   Pulse 62   Temp 98 F (36.7 C) (Oral)   Resp 18   Ht 6\' 3"  (1.905 m)   Wt 118.8 kg   SpO2 98%   BMI 32.74 kg/m   Intake/Output Summary (Last 24 hours) at 12/31/2018 1031 Last data filed at 12/31/2018 0917 Gross per 24 hour  Intake 1078 ml  Output 250 ml  Net 828 ml   Weight change: 0.6 kg  Physical Exam: Gen: Resting comfortably in bed, watching television CVS: Pulse regular rhythm, normal rate, S1 and S2 normal Resp: Decreased breath sounds over bases, no rales Abd: Soft, obese, nontender Ext: 2+  pedal edema, scrotal edema noted.  Imaging: Ir Fluoro Guide Cv Line Left  Result Date: 12/30/2018 INDICATION: 43 year old with acute renal failure. Recently removed right jugular dialysis catheter and needs new temporary access. EXAM: FLUOROSCOPIC AND ULTRASOUND GUIDED PLACEMENT OF A NON-TUNNELED DIALYSIS CATHETER Physician: Stephan Minister. Henn, MD MEDICATIONS: None ANESTHESIA/SEDATION: None FLUOROSCOPY TIME:  Fluoroscopy Time: 42 seconds, 2 mGy COMPLICATIONS: None immediate. PROCEDURE: Informed consent was obtained for catheter placement. The patient was placed supine on the interventional table. Ultrasound demonstrated focal thrombus in the right internal jugular vein at the old catheter site. Therefore, attention was directed to the left side of the neck. Ultrasound confirmed a patent left internal jugular veinvein. Ultrasound images were obtained for documentation. The left side of the neck was prepped and draped in a sterile fashion. The left side of the neck was anesthetized with 1% lidocaine. Maximal barrier sterile technique was utilized including caps, mask, sterile gowns, sterile gloves, sterile drape, hand hygiene and skin antiseptic. A small incision was made with #11 blade scalpel. A 21 gauge needle directed into the left internal jugular vein with ultrasound guidance. A micropuncture dilator set was placed. A 20 cm Mahurkar catheter was selected. The catheter was advanced over a wire and positioned at the superior cavoatrial junction. Fluoroscopic images were obtained for documentation. Both dialysis lumens were found to aspirate and flush well. The proper amount of heparin was flushed in both lumens. The central venous lumen was flushed with normal saline. Catheter was sutured to skin. FINDINGS: Catheter tip at the superior cavoatrial junction. Focal thrombus in the  right internal jugular vein likely from recent catheter. IMPRESSION: Successful placement of a left jugular non-tunneled dialysis catheter  using ultrasound and fluoroscopic guidance. Focal thrombus in the right internal jugular vein was discussed with Dr. Posey Pronto in Nephrology. Electronically Signed   By: Markus Daft M.D.   On: 12/30/2018 16:55   Ir US Guide Vasc Access Left  Result Date: 12/30/2018 INDICATION: 43 year old with acute renal failure. Recently removed right jugular dialysis catheter and needs new temporary access. EXAM: FLUOROSCOPIC AND ULTRASOUND GUIDED PLACEMENT OF A NON-TUNNELED DIALYSIS CATHETER Physician: Stephan Minister. Henn, MD MEDICATIONS: None ANESTHESIA/SEDATION: None FLUOROSCOPY TIME:  Fluoroscopy Time: 42 seconds, 2 mGy COMPLICATIONS: None immediate. PROCEDURE: Informed consent was obtained for catheter placement. The patient was placed supine on the interventional table. Ultrasound demonstrated focal thrombus in the right internal jugular vein at the old catheter site. Therefore, attention was directed to the left side of the neck. Ultrasound confirmed a patent left internal jugular veinvein. Ultrasound images were obtained for documentation. The left side of the neck was prepped and draped in a sterile fashion. The left side of the neck was anesthetized with 1% lidocaine. Maximal barrier sterile technique was utilized including caps, mask, sterile gowns, sterile gloves, sterile drape, hand hygiene and skin antiseptic. A small incision was made with #11 blade scalpel. A 21 gauge needle directed into the left internal jugular vein with ultrasound guidance. A micropuncture dilator set was placed. A 20 cm Mahurkar catheter was selected. The catheter was advanced over a wire and positioned at the superior cavoatrial junction. Fluoroscopic images were obtained for documentation. Both dialysis lumens were found to aspirate and flush well. The proper amount of heparin was flushed in both lumens. The central venous lumen was flushed with normal saline. Catheter was sutured to skin. FINDINGS: Catheter tip at the superior cavoatrial junction.  Focal thrombus in the right internal jugular vein likely from recent catheter. IMPRESSION: Successful placement of a left jugular non-tunneled dialysis catheter using ultrasound and fluoroscopic guidance. Focal thrombus in the right internal jugular vein was discussed with Dr. Posey Pronto in Nephrology. Electronically Signed   By: Markus Daft M.D.   On: 12/30/2018 16:55    Labs: BMET Recent Labs  Lab 12/26/18 0347 12/27/18 0505 12/28/18 0307 12/29/18 0446 12/30/18 0237 12/31/18 0229  NA 127* 129* 132* 131* 128* 130*  K 5.7* 4.6 4.3 4.4 4.7 4.0  CL 91* 92* 95* 94* 92* 92*  CO2 23 26 27 25 24 24   GLUCOSE 98 88 88 88 82 80  BUN 46* 41* 31* 48* 58* 31*  CREATININE 7.80* 6.26* 5.07* 6.82* 8.03* 5.11*  CALCIUM 8.5* 8.2* 8.1* 8.2* 8.2* 7.9*  PHOS  --   --   --   --  5.5* 3.8   CBC Recent Labs  Lab 12/27/18 0505 12/28/18 0307 12/29/18 0446 12/30/18 0237 12/31/18 0229  WBC 9.0 8.0 6.6 7.1 7.8  NEUTROABS 6.9 5.7  --  4.6 5.3  HGB 8.2* 8.5* 8.5* 8.3* 8.4*  HCT 24.6* 25.0* 25.7* 25.4* 25.3*  MCV 93.9 91.6 91.5 91.4 90.0  PLT 242 272 291 326 345    Medications:    . amLODipine  10 mg Oral Daily  . Chlorhexidine Gluconate Cloth  6 each Topical Q0600  . feeding supplement (ENSURE ENLIVE)  237 mL Oral BID BM  . FLUoxetine  40 mg Oral Daily  . folic acid  1 mg Oral Daily  . heparin      . heparin  5,000 Units Subcutaneous  Q8H  . loxapine  25 mg Oral Daily  . loxapine  50 mg Oral QHS  . metoprolol tartrate  25 mg Oral BID  . polyethylene glycol  17 g Oral Daily  . risperiDONE  9 mg Oral QHS  . senna-docusate  1 tablet Oral BID  . thiamine  100 mg Oral Daily  . topiramate  250 mg Oral QHS  . traZODone  300 mg Oral QHS   Elmarie Shiley, MD 12/31/2018, 10:31 AM

## 2019-01-01 ENCOUNTER — Inpatient Hospital Stay (HOSPITAL_COMMUNITY): Payer: Medicare HMO

## 2019-01-01 DIAGNOSIS — I82409 Acute embolism and thrombosis of unspecified deep veins of unspecified lower extremity: Secondary | ICD-10-CM

## 2019-01-01 LAB — CBC WITH DIFFERENTIAL/PLATELET
Abs Immature Granulocytes: 0.29 10*3/uL — ABNORMAL HIGH (ref 0.00–0.07)
Basophils Absolute: 0.1 10*3/uL (ref 0.0–0.1)
Basophils Relative: 1 %
Eosinophils Absolute: 0.2 10*3/uL (ref 0.0–0.5)
Eosinophils Relative: 3 %
HCT: 24.9 % — ABNORMAL LOW (ref 39.0–52.0)
Hemoglobin: 8.3 g/dL — ABNORMAL LOW (ref 13.0–17.0)
Immature Granulocytes: 4 %
Lymphocytes Relative: 20 %
Lymphs Abs: 1.4 10*3/uL (ref 0.7–4.0)
MCH: 31.3 pg (ref 26.0–34.0)
MCHC: 33.3 g/dL (ref 30.0–36.0)
MCV: 94 fL (ref 80.0–100.0)
Monocytes Absolute: 0.6 10*3/uL (ref 0.1–1.0)
Monocytes Relative: 9 %
Neutro Abs: 4.4 10*3/uL (ref 1.7–7.7)
Neutrophils Relative %: 63 %
Platelets: 362 10*3/uL (ref 150–400)
RBC: 2.65 MIL/uL — ABNORMAL LOW (ref 4.22–5.81)
RDW: 12.3 % (ref 11.5–15.5)
WBC: 6.9 10*3/uL (ref 4.0–10.5)
nRBC: 0 % (ref 0.0–0.2)

## 2019-01-01 LAB — CULTURE, BLOOD (ROUTINE X 2)
Culture: NO GROWTH
Culture: NO GROWTH
Special Requests: ADEQUATE
Special Requests: ADEQUATE

## 2019-01-01 LAB — RENAL FUNCTION PANEL
Albumin: 2.1 g/dL — ABNORMAL LOW (ref 3.5–5.0)
Albumin: 2.1 g/dL — ABNORMAL LOW (ref 3.5–5.0)
Anion gap: 14 (ref 5–15)
Anion gap: 9 (ref 5–15)
BUN: 31 mg/dL — ABNORMAL HIGH (ref 6–20)
BUN: 19 mg/dL (ref 6–20)
CO2: 24 mmol/L (ref 22–32)
CO2: 29 mmol/L (ref 22–32)
Calcium: 7.9 mg/dL — ABNORMAL LOW (ref 8.9–10.3)
Calcium: 8.2 mg/dL — ABNORMAL LOW (ref 8.9–10.3)
Chloride: 92 mmol/L — ABNORMAL LOW (ref 98–111)
Chloride: 95 mmol/L — ABNORMAL LOW (ref 98–111)
Creatinine, Ser: 5.11 mg/dL — ABNORMAL HIGH (ref 0.61–1.24)
Creatinine, Ser: 4.25 mg/dL — ABNORMAL HIGH (ref 0.61–1.24)
GFR calc Af Amer: 15 mL/min — ABNORMAL LOW (ref >60)
GFR calc Af Amer: 18 mL/min — ABNORMAL LOW (ref >60)
GFR calc non Af Amer: 13 mL/min — ABNORMAL LOW (ref >60)
GFR calc non Af Amer: 16 mL/min — ABNORMAL LOW (ref >60)
Glucose, Bld: 80 mg/dL (ref 70–99)
Glucose, Bld: 84 mg/dL (ref 70–99)
Phosphorus: 3.8 mg/dL (ref 2.5–4.6)
Phosphorus: 4.2 mg/dL (ref 2.5–4.6)
Potassium: 4 mmol/L (ref 3.5–5.1)
Potassium: 4.2 mmol/L (ref 3.5–5.1)
Sodium: 130 mmol/L — ABNORMAL LOW (ref 135–145)
Sodium: 133 mmol/L — ABNORMAL LOW (ref 135–145)

## 2019-01-01 MED ORDER — HEPARIN BOLUS VIA INFUSION
3000.0000 [IU] | Freq: Once | INTRAVENOUS | Status: AC
  Administered 2019-01-01: 3000 [IU] via INTRAVENOUS
  Filled 2019-01-01: qty 3000

## 2019-01-01 MED ORDER — HEPARIN (PORCINE) 25000 UT/250ML-% IV SOLN
1850.0000 [IU]/h | INTRAVENOUS | Status: DC
  Administered 2019-01-01: 1650 [IU]/h via INTRAVENOUS
  Administered 2019-01-02: 1850 [IU]/h via INTRAVENOUS
  Filled 2019-01-01 (×2): qty 250

## 2019-01-01 NOTE — Progress Notes (Signed)
PT Cancellation Note  Patient Details Name: James [name] MRN: [id number] DOB: 1976/01/24   Cancelled Treatment:    Reason Eval/Treat Not Completed: Medical issues which prohibited therapy. DVT rt IJ.    [place] 01/01/2019, 1:15 PM [place] Pager [phone] Office [phone]

## 2019-01-01 NOTE — Progress Notes (Signed)
Pharmacy Antibiotic Note  James [name] is a 43 y.o. male admitted on 12/19/2018 with MSSA bacteremia.  Pharmacy has been consulted for cefazolin dosing. Patient with AKI requiring HD. Patient's TDC has been pulled and is currently on line holiday with Cultures from 4/14 NGTD. ID is consulted and recommends 14 days of cefazolin from line removal with a stop date of 01/10/19.   Due to non-regular HD schedule will dose cefazolin q24h. If patient starts on regular HD schedule, will start dosing with HD.  Plan: Cefazolin 1gm IV q24h Monitor for HD schedule change, renal function improvement and clinical course   Height: 6\' 3"  (190.5 cm) Weight: 259 lb 0.7 oz (117.5 kg) IBW/kg (Calculated) : 84.5  Temp (24hrs), Avg:98.3 F (36.8 C), Min:97.8 F (36.6 C), Max:98.9 F (37.2 C)  Recent Labs  Lab 12/28/18 0307 12/29/18 0446 12/30/18 0237 12/31/18 0229 01/01/19 0421  WBC 8.0 6.6 7.1 7.8 6.9  CREATININE 5.07* 6.82* 8.03* 5.11* 4.25*    Estimated Creatinine Clearance: 30.3 mL/min (A) (by C-G formula based on SCr of 4.25 mg/dL (H)).    No Known Allergies  Microbiology results: 4/12 BCx: MSSA 4/14 BCx: NGTD  James [name] A. Levada Dy, PharmD, [place] Please utilize Amion for appropriate phone number to reach the unit pharmacist ([place])   01/01/2019 9:44 AM

## 2019-01-01 NOTE — Progress Notes (Signed)
Patient ID: James [name], male   DOB: 1975/09/18, 43 y.o.   MRN: [id number] [facility] Progress Note   Assessment/ Plan:   1. Acute kidney Injury: Oliguric and without renal recovery yet.  Today's labs reflective of hemodialysis yesterday.  I am optimistic for renal recovery but this may be slow and protracted based on severity of injury.  If he does not have meaningful recovery over the next 1 or 2 days, we will pursue conversion of temporary catheter to a tunneled catheter and outpatient dialysis unit placement for acute kidney injury. 2.  Volume overload: Oliguric and on intermittent dialysis with ultrafiltration. 3.  Bipolar disorder/anxiety: Ongoing management per primary service. 4.  History of alcohol abuse: We will continue to monitor closely for withdrawal symptoms, initial ultrasound suggestive of early development of cirrhosis (suspect fatty liver has a contributory effect). 5.  Hyponatremia: Secondary to acute kidney injury and unrestricted oral fluid intake-reeducated on fluid restriction. 6. MSSA bacteremia: On intravenous cefazolin adjusted with dialysis.  Status post catheter holiday. 7.  Right IJ thrombus: Ultrasound evaluation done yesterday-awaiting formal report to decide on need for anticoagulation. Subjective:   Underwent hemodialysis yesterday without problem, denies any current chest pain or shortness of breath. Inquires about disposition.    Objective:   BP 138/84 (BP Location: Right Arm)   Pulse (!) 56   Temp 97.8 F (36.6 C) (Oral)   Resp 16   Ht 6\' 3"  (1.905 m)   Wt 117.5 kg   SpO2 95%   BMI 32.38 kg/m   Intake/Output Summary (Last 24 hours) at 01/01/2019 1022 Last data filed at 01/01/2019 0434 Gross per 24 hour  Intake -  Output 4200 ml  Net -4200 ml   Weight change: -2.1 kg  Physical Exam: Gen: Resting comfortably in bed, watching television CVS: Pulse regular rhythm, normal rate, S1 and S2 normal Resp: Decreased breath sounds  over bases, no rales Abd: Soft, obese, nontender Ext: 1-2+ pedal edema, decreasing scrotal edema noted.  Imaging: Ir Fluoro Guide Cv Line Left  Result Date: 12/30/2018 INDICATION: 43 year old with acute renal failure. Recently removed right jugular dialysis catheter and needs new temporary access. EXAM: FLUOROSCOPIC AND ULTRASOUND GUIDED PLACEMENT OF A NON-TUNNELED DIALYSIS CATHETER Physician: Stephan Minister. Henn, MD MEDICATIONS: None ANESTHESIA/SEDATION: None FLUOROSCOPY TIME:  Fluoroscopy Time: 42 seconds, 2 mGy COMPLICATIONS: None immediate. PROCEDURE: Informed consent was obtained for catheter placement. The patient was placed supine on the interventional table. Ultrasound demonstrated focal thrombus in the right internal jugular vein at the old catheter site. Therefore, attention was directed to the left side of the neck. Ultrasound confirmed a patent left internal jugular veinvein. Ultrasound images were obtained for documentation. The left side of the neck was prepped and draped in a sterile fashion. The left side of the neck was anesthetized with 1% lidocaine. Maximal barrier sterile technique was utilized including caps, mask, sterile gowns, sterile gloves, sterile drape, hand hygiene and skin antiseptic. A small incision was made with #11 blade scalpel. A 21 gauge needle directed into the left internal jugular vein with ultrasound guidance. A micropuncture dilator set was placed. A 20 cm Mahurkar catheter was selected. The catheter was advanced over a wire and positioned at the superior cavoatrial junction. Fluoroscopic images were obtained for documentation. Both dialysis lumens were found to aspirate and flush well. The proper amount of heparin was flushed in both lumens. The central venous lumen was flushed with normal saline. Catheter was sutured to skin. FINDINGS: Catheter tip at  the superior cavoatrial junction. Focal thrombus in the right internal jugular vein likely from recent catheter.  IMPRESSION: Successful placement of a left jugular non-tunneled dialysis catheter using ultrasound and fluoroscopic guidance. Focal thrombus in the right internal jugular vein was discussed with Dr. Posey Pronto in Nephrology. Electronically Signed   By: Markus Daft M.D.   On: 12/30/2018 16:55   Ir US Guide Vasc Access Left  Result Date: 12/30/2018 INDICATION: 43 year old with acute renal failure. Recently removed right jugular dialysis catheter and needs new temporary access. EXAM: FLUOROSCOPIC AND ULTRASOUND GUIDED PLACEMENT OF A NON-TUNNELED DIALYSIS CATHETER Physician: Stephan Minister. Henn, MD MEDICATIONS: None ANESTHESIA/SEDATION: None FLUOROSCOPY TIME:  Fluoroscopy Time: 42 seconds, 2 mGy COMPLICATIONS: None immediate. PROCEDURE: Informed consent was obtained for catheter placement. The patient was placed supine on the interventional table. Ultrasound demonstrated focal thrombus in the right internal jugular vein at the old catheter site. Therefore, attention was directed to the left side of the neck. Ultrasound confirmed a patent left internal jugular veinvein. Ultrasound images were obtained for documentation. The left side of the neck was prepped and draped in a sterile fashion. The left side of the neck was anesthetized with 1% lidocaine. Maximal barrier sterile technique was utilized including caps, mask, sterile gowns, sterile gloves, sterile drape, hand hygiene and skin antiseptic. A small incision was made with #11 blade scalpel. A 21 gauge needle directed into the left internal jugular vein with ultrasound guidance. A micropuncture dilator set was placed. A 20 cm Mahurkar catheter was selected. The catheter was advanced over a wire and positioned at the superior cavoatrial junction. Fluoroscopic images were obtained for documentation. Both dialysis lumens were found to aspirate and flush well. The proper amount of heparin was flushed in both lumens. The central venous lumen was flushed with normal saline.  Catheter was sutured to skin. FINDINGS: Catheter tip at the superior cavoatrial junction. Focal thrombus in the right internal jugular vein likely from recent catheter. IMPRESSION: Successful placement of a left jugular non-tunneled dialysis catheter using ultrasound and fluoroscopic guidance. Focal thrombus in the right internal jugular vein was discussed with Dr. Posey Pronto in Nephrology. Electronically Signed   By: Markus Daft M.D.   On: 12/30/2018 16:55    Labs: BMET Recent Labs  Lab 12/26/18 0347 12/27/18 0505 12/28/18 0307 12/29/18 0446 12/30/18 0237 12/31/18 0229 01/01/19 0421  NA 127* 129* 132* 131* 128* 130* 133*  K 5.7* 4.6 4.3 4.4 4.7 4.0 4.2  CL 91* 92* 95* 94* 92* 92* 95*  CO2 23 26 27 25 24 24 29   GLUCOSE 98 88 88 88 82 80 84  BUN 46* 41* 31* 48* 58* 31* 19  CREATININE 7.80* 6.26* 5.07* 6.82* 8.03* 5.11* 4.25*  CALCIUM 8.5* 8.2* 8.1* 8.2* 8.2* 7.9* 8.2*  PHOS  --   --   --   --  5.5* 3.8 4.2   CBC Recent Labs  Lab 12/28/18 0307 12/29/18 0446 12/30/18 0237 12/31/18 0229 01/01/19 0421  WBC 8.0 6.6 7.1 7.8 6.9  NEUTROABS 5.7  --  4.6 5.3 4.4  HGB 8.5* 8.5* 8.3* 8.4* 8.3*  HCT 25.0* 25.7* 25.4* 25.3* 24.9*  MCV 91.6 91.5 91.4 90.0 94.0  PLT 272 291 326 345 362    Medications:    . amLODipine  10 mg Oral Daily  . Chlorhexidine Gluconate Cloth  6 each Topical Q0600  . feeding supplement (ENSURE ENLIVE)  237 mL Oral BID BM  . FLUoxetine  40 mg Oral Daily  . folic acid  1 mg Oral Daily  . heparin  5,000 Units Subcutaneous Q8H  . loxapine  25 mg Oral Daily  . loxapine  50 mg Oral QHS  . metoprolol tartrate  25 mg Oral BID  . polyethylene glycol  17 g Oral Daily  . risperiDONE  9 mg Oral QHS  . senna-docusate  1 tablet Oral BID  . thiamine  100 mg Oral Daily  . topiramate  250 mg Oral QHS  . traZODone  300 mg Oral QHS   Elmarie Shiley, MD 01/01/2019, 10:22 AM

## 2019-01-01 NOTE — Progress Notes (Signed)
VASCULAR LAB PRELIMINARY  PRELIMINARY  PRELIMINARY  PRELIMINARY  Right upper extremity venous duplex completed.    Preliminary report:  See CV proc for preliminary report.   Teneil Shiller, RVT 01/01/2019, 11:17 AM

## 2019-01-01 NOTE — Progress Notes (Addendum)
PROGRESS NOTE    James [name]  XKG:[id number] DOB: 1975-10-25 DOA: 12/19/2018 PCP: No primary care provider on file.    Brief Narrative:  43 year old male with history of hypertension, diabetes mellitus type 2, bipolar disorder presented on 12/17/2018 to [facility] after being found down for 2 days following binge alcohol drinking.  He was found to have AKI, metabolic acidosis, leukocytosis and LFT elevation with rhabdomyolysis, CK of 58,000.  He was treated with aggressive IV fluids but unfortunately his renal function got worse with decreased urine output and CK rose to 130,000.  No improvement with IV diuresis.  He was transferred to [facility] on 12/19/2018.  Nephrology was consulted.  Tunneled HD catheter was inserted and hemodialysis was started on 12/20/2018.  HD catheter subsequently removed 12/27/2018.  Patient also noted to have a MSSA bacteremia.  ID consulted.   Assessment & Plan:   Principal Problem:   MSSA bacteremia Active Problems:   Diabetes mellitus ([place])   Bipolar affective disorder ([place])   ARF (acute renal failure) ([place])   Alcoholism ([place])   AKI (acute kidney injury) ([place])   Hemodialysis catheter infection ([place])   Traumatic rhabdomyolysis ([place])  1 MSSA bacteremia Questionable etiology.  Concern for possible line infection.  2D echo which was done was negative for infective endocarditis and only mild mitral valvular regurgitation and trivial tricuspid valvular regurgitation.  TEE was unable to be done at this time.  HD catheter line was removed last night 12/27/2018.  Repeat blood cultures have been ordered per ID which are currently pending.  Blood cultures from 12/27/2018 with no growth to date.  ID recommending treating with IV cefazolin x14 days from line removal with day 1 either 12/27/2018 or 12/28/2018 with treatment through 01/10/2019.  ID following and appreciate input and recommendations.  2.  Acute renal failure Felt likely secondary to  rhabdomyolysis plus or minus ATN.  Patient had a HD catheter line placed and underwent hemodialysis on Monday and Tuesday followed by removal of dialysis catheter due to concerns might be etiology of MSSA bacteremia.  Patient urine output of 200 cc over the past 24 hours which was recorded.  Creatinine trending back up and down and currently at 4.25 from  5.11 from 8.03 from 6.82 from 5.07 from 6.26 from 7.80 after hemodialysis was resumed..  Per nephrology temporary dialysis catheter was placed on 12/30/2018 and patient resumed back on hemodialysis.  Per nephrology.   3.  Rhabdomyolysis Improving daily.  Patient noted to have initial presentation with a CK level of 58,000 and treated aggressively with IV fluids however renal function worsened with decreased urine output and worsening CK of 130,000.  Rhabdomyolysis initially was not improving with diuresis. Patient subsequently underwent hemodialysis.  CK levels have trended down to 984. Follow.  4.  Volume overload Improved with hemodialysis initially.  HD catheter has been removed.  Patient still with volume overload noted on examination with scrotal swelling and some lower extremity edema.  Placement of non-tunneled dialysis catheter was done 12/30/2018.  Patient started back up on hemodialysis with improvement with volume status.  4 L removed during hemodialysis yesterday.. Monitor urine output.  Per nephrology.  5.  Alcohol abuse No signs of alcohol withdrawal noted.  Was on the Librium tapering doses which has been discontinued.  Alcohol cessation stressed to patient.  Follow.  6.  Transaminitis Felt secondary to rhabdomyolysis and alcohol use.  Acute hepatitis panel negative.  HIV nonreactive.  Right upper quadrant ultrasound with early  cirrhotic changes noted.  LFTs trended down.  Follow.  7.  Hyponatremia Secondary to hypervolemic hyponatremia secondary to acute kidney injury.  Initially improved with hemodialysis however trending down  yesterday and trending back up after patient placed back on hemodialysis.  Status post non-tunneled dialysis catheter 12/30/2018 per interventional radiology.  Patient started back on hemodialysis.  Per nephrology. Follow.  8.  Diabetes mellitus type 2 Hemoglobin A1c of 5.2.  Oral diabetic medications on hold. D/C'd sliding scale insulin.   9.  Elevated troponin Secondary to rhabdomyolysis.  No further interventions at this time.  10.  Maculopapular rash Improved.  11.  Obesity  12.  Bipolar disorder/anxiety Stable.  Continue current regimen of Prozac, Risperdal, Topamax.  Trazodone nightly.  13.  Hypertension Stable on current regimen of Lopressor and Norvasc.   43.  Right IJ thrombus Right upper extremity Dopplers done and consistent with age indeterminant deep vein thrombosis involving the right internal jugular veins.  Will place on IV heparin.  Discussed with nephrology.    DVT prophylaxis: Heparin Code Status: Full Family Communication: Updated patient.  No family at bedside. Disposition Plan: Likely home with home health when clinically stable, improvement with renal function, when okay with nephrology and ID.   Consultants:   Infectious disease: Dr. Rhina Brackett [place] 12/26/2018  Nephrology: Dr. Hollie Salk 12/19/2018  Procedures:   Chest x-ray 12/25/2018  Renal ultrasound 12/20/2018  Right IJ catheter placement under ultrasound and fluoroscopic guidance per Dr.Hassell interventional radiology for 12/20/2018  2D echo 12/26/2018  Placement of non-tunneled dialysis catheter left IJ per Dr. Anselm Pancoast 12/30/2018  Antimicrobials:   IV Ancef 12/26/2018>>>>> 01/10/2019   Subjective: Patient sitting up in bed.  Denies any shortness of breath.  No chest pain.  Feeling somewhat better.   Objective: Vitals:   12/31/18 2233 01/01/19 0434 01/01/19 0705 01/01/19 1015  BP: 134/89  138/84 (!) 142/86  Pulse: (!) 59  (!) 56 60  Resp: 18  16 16   Temp: 97.8 F (36.6 C)  97.8 F (36.6 C)     TempSrc: Oral  Oral   SpO2: 95%  95% 96%  Weight:  117.5 kg    Height:        Intake/Output Summary (Last 24 hours) at 01/01/2019 1234 Last data filed at 01/01/2019 1015 Gross per 24 hour  Intake 240 ml  Output 4275 ml  Net -4035 ml   Filed Weights   12/31/18 1355 12/31/18 1838 01/01/19 0434  Weight: 121.3 kg 117 kg 117.5 kg    Examination:  General exam: NAD Respiratory system: Lungs clear to auscultation bilaterally.  No wheezes, no crackles, no rhonchi.  Normal respiratory effort.   Cardiovascular system: RRR no murmurs rubs or gallops.  No JVD.  No lower extremity edema.  2+ bilateral lower extremity edema. Gastrointestinal system: Abdomen is soft, nontender, nondistended, positive bowel sounds.  No rebound.  No guarding.  GU: Scrotal swelling decreasing. Central nervous system: Alert and oriented. No focal neurological deficits. Extremities: 2+ BLE edema Symmetric 5 x 5 power. Skin: No rashes, lesions or ulcers Psychiatry: Judgement and insight appear fair. Mood & affect appropriate.     Data Reviewed: I have personally reviewed following labs and imaging studies  CBC: Recent Labs  Lab 12/27/18 0505 12/28/18 0307 12/29/18 0446 12/30/18 0237 12/31/18 0229 01/01/19 0421  WBC 9.0 8.0 6.6 7.1 7.8 6.9  NEUTROABS 6.9 5.7  --  4.6 5.3 4.4  HGB 8.2* 8.5* 8.5* 8.3* 8.4* 8.3*  HCT 24.6* 25.0* 25.7* 25.4*  25.3* 24.9*  MCV 93.9 91.6 91.5 91.4 90.0 94.0  PLT 242 272 291 326 345 277   Basic Metabolic Panel: Recent Labs  Lab 12/26/18 0347 12/27/18 0505 12/28/18 0307 12/29/18 0446 12/30/18 0237 12/31/18 0229 01/01/19 0421  NA 127* 129* 132* 131* 128* 130* 133*  K 5.7* 4.6 4.3 4.4 4.7 4.0 4.2  CL 91* 92* 95* 94* 92* 92* 95*  CO2 23 26 27 25 24 24 29   GLUCOSE 98 88 88 88 82 80 84  BUN 46* 41* 31* 48* 58* 31* 19  CREATININE 7.80* 6.26* 5.07* 6.82* 8.03* 5.11* 4.25*  CALCIUM 8.5* 8.2* 8.1* 8.2* 8.2* 7.9* 8.2*  MG 1.9 2.1 2.0  --   --   --   --   PHOS  --   --    --   --  5.5* 3.8 4.2   GFR: Estimated Creatinine Clearance: 30.3 mL/min (A) (by C-G formula based on SCr of 4.25 mg/dL (H)). Liver Function Tests: Recent Labs  Lab 12/28/18 0307 12/29/18 0446 12/30/18 0237 12/31/18 0229 01/01/19 0421  AST 73* 49* 41  --   --   ALT 34 17 12  --   --   ALKPHOS 87 73 84  --   --   BILITOT 0.5 0.3 0.5  --   --   PROT 5.0* 4.9* 5.1*  --   --   ALBUMIN 2.0* 2.0* 2.1*   2.1* 2.1* 2.1*   No results for input(s): LIPASE, AMYLASE in the last 168 hours. No results for input(s): AMMONIA in the last 168 hours. Coagulation Profile: No results for input(s): INR, PROTIME in the last 168 hours. Cardiac Enzymes: Recent Labs  Lab 12/26/18 0347 12/28/18 0307 12/29/18 0446  CKTOTAL 3,103* 1,446* 984*   BNP (last 3 results) No results for input(s): PROBNP in the last 8760 hours. HbA1C: No results for input(s): HGBA1C in the last 72 hours. CBG: Recent Labs  Lab 12/30/18 1206 12/30/18 1656 12/31/18 0757 12/31/18 1209 12/31/18 2231  GLUCAP 93 71 80 95 110*   Lipid Profile: No results for input(s): CHOL, HDL, LDLCALC, TRIG, CHOLHDL, LDLDIRECT in the last 72 hours. Thyroid Function Tests: No results for input(s): TSH, T4TOTAL, FREET4, T3FREE, THYROIDAB in the last 72 hours. Anemia Panel: No results for input(s): VITAMINB12, FOLATE, FERRITIN, TIBC, IRON, RETICCTPCT in the last 72 hours. Sepsis Labs: No results for input(s): PROCALCITON, LATICACIDVEN in the last 168 hours.  Recent Results (from the past 240 hour(s))  Culture, blood (routine x 2)     Status: Abnormal   Collection Time: 12/25/18 10:00 AM  Result Value Ref Range Status   Specimen Description BLOOD LEFT HAND  Final   Special Requests AEROBIC BOTTLE ONLY Blood Culture adequate volume  Final   Culture  Setup Time   Final    GRAM POSITIVE COCCI IN CLUSTERS AEROBIC BOTTLE ONLY CRITICAL VALUE NOTED.  VALUE IS CONSISTENT WITH PREVIOUSLY REPORTED AND CALLED VALUE.    Culture (A)  Final     STAPHYLOCOCCUS AUREUS SUSCEPTIBILITIES PERFORMED ON PREVIOUS CULTURE WITHIN THE LAST 5 DAYS. Performed at [facility], [place] [address], [place], [place] [address]    Report Status 12/28/2018 FINAL  Final  Culture, blood (routine x 2)     Status: Abnormal   Collection Time: 12/25/18 10:04 AM  Result Value Ref Range Status   Specimen Description BLOOD LEFT HAND  Final   Special Requests AEROBIC BOTTLE ONLY Blood Culture adequate volume  Final   Culture  Setup Time   Final    GRAM POSITIVE COCCI IN CLUSTERS AEROBIC BOTTLE ONLY CRITICAL RESULT CALLED TO, READ BACK BY AND VERIFIED WITH: Salli Real [id number] 12/26/2018 Mena Goes Performed at [facility], [address], [place], [place] [address]    Culture STAPHYLOCOCCUS AUREUS (A)  Final   Report Status 12/28/2018 FINAL  Final   Organism ID, Bacteria STAPHYLOCOCCUS AUREUS  Final      Susceptibility   Staphylococcus aureus - MIC*    CIPROFLOXACIN <=0.5 SENSITIVE Sensitive     ERYTHROMYCIN <=0.25 SENSITIVE Sensitive     GENTAMICIN <=0.5 SENSITIVE Sensitive     OXACILLIN 0.5 SENSITIVE Sensitive     TETRACYCLINE <=1 SENSITIVE Sensitive     VANCOMYCIN <=0.5 SENSITIVE Sensitive     TRIMETH/SULFA <=10 SENSITIVE Sensitive     CLINDAMYCIN <=0.25 SENSITIVE Sensitive     RIFAMPIN <=0.5 SENSITIVE Sensitive     Inducible Clindamycin NEGATIVE Sensitive     * STAPHYLOCOCCUS AUREUS  Blood Culture ID Panel (Reflexed)     Status: Abnormal   Collection Time: 12/25/18 10:04 AM  Result Value Ref Range Status   Enterococcus species NOT DETECTED NOT DETECTED Final   Listeria monocytogenes NOT DETECTED NOT DETECTED Final   Staphylococcus species DETECTED (A) NOT DETECTED Final    Comment: CRITICAL RESULT CALLED TO, READ BACK BY AND VERIFIED WITH: G. ABBOTT,PHARMD [id number] 12/26/2018 T. TYSOR    Staphylococcus aureus (BCID) DETECTED (A) NOT DETECTED Final    Comment: Methicillin (oxacillin) susceptible Staphylococcus aureus (MSSA). Preferred  therapy is anti staphylococcal beta lactam antibiotic (Cefazolin or Nafcillin), unless clinically contraindicated. CRITICAL RESULT CALLED TO, READ BACK BY AND VERIFIED WITH: G. ABBOTT,PHARMD [id number] 12/26/2018 T. TYSOR    Methicillin resistance NOT DETECTED NOT DETECTED Final   Streptococcus species NOT DETECTED NOT DETECTED Final   Streptococcus agalactiae NOT DETECTED NOT DETECTED Final   Streptococcus pneumoniae NOT DETECTED NOT DETECTED Final   Streptococcus pyogenes NOT DETECTED NOT DETECTED Final   Acinetobacter baumannii NOT DETECTED NOT DETECTED Final   Enterobacteriaceae species NOT DETECTED NOT DETECTED Final   Enterobacter cloacae complex NOT DETECTED NOT DETECTED Final   Escherichia coli NOT DETECTED NOT DETECTED Final   Klebsiella oxytoca NOT DETECTED NOT DETECTED Final   Klebsiella pneumoniae NOT DETECTED NOT DETECTED Final   Proteus species NOT DETECTED NOT DETECTED Final   Serratia marcescens NOT DETECTED NOT DETECTED Final   Haemophilus influenzae NOT DETECTED NOT DETECTED Final   Neisseria meningitidis NOT DETECTED NOT DETECTED Final   Pseudomonas aeruginosa NOT DETECTED NOT DETECTED Final   Candida albicans NOT DETECTED NOT DETECTED Final   Candida glabrata NOT DETECTED NOT DETECTED Final   Candida krusei NOT DETECTED NOT DETECTED Final   Candida parapsilosis NOT DETECTED NOT DETECTED Final   Candida tropicalis NOT DETECTED NOT DETECTED Final    Comment: Performed at [facility], [place] [address], [place], [place] [address]  Culture, Urine     Status: Abnormal   Collection Time: 12/25/18  5:00 PM  Result Value Ref Range Status   Specimen Description URINE, CLEAN CATCH  Final   Special Requests   Final    NONE Performed at [facility], [place] [address], [place], [place] [address]    Culture >=100,000 COLONIES/mL STAPHYLOCOCCUS EPIDERMIDIS (A)  Final   Report Status 12/27/2018 FINAL  Final   Organism ID, Bacteria STAPHYLOCOCCUS EPIDERMIDIS (A)  Final       Susceptibility   Staphylococcus epidermidis - MIC*  CIPROFLOXACIN >=8 RESISTANT Resistant     GENTAMICIN <=0.5 SENSITIVE Sensitive     NITROFURANTOIN <=16 SENSITIVE Sensitive     OXACILLIN >=4 RESISTANT Resistant     TETRACYCLINE <=1 SENSITIVE Sensitive     VANCOMYCIN 2 SENSITIVE Sensitive     TRIMETH/SULFA <=10 SENSITIVE Sensitive     CLINDAMYCIN <=0.25 SENSITIVE Sensitive     RIFAMPIN <=0.5 SENSITIVE Sensitive     Inducible Clindamycin NEGATIVE Sensitive     * >=100,000 COLONIES/mL STAPHYLOCOCCUS EPIDERMIDIS  Culture, blood (routine x 2)     Status: None   Collection Time: 12/27/18  7:35 AM  Result Value Ref Range Status   Specimen Description BLOOD LEFT HAND  Final   Special Requests   Final    BOTTLES DRAWN AEROBIC ONLY Blood Culture adequate volume   Culture   Final    NO GROWTH 5 DAYS Performed at [facility], [place] [address] [place], [place] [address]    Report Status 01/01/2019 FINAL  Final  Culture, blood (routine x 2)     Status: None   Collection Time: 12/27/18  7:37 AM  Result Value Ref Range Status   Specimen Description BLOOD LEFT WRIST  Final   Special Requests   Final    BOTTLES DRAWN AEROBIC ONLY Blood Culture adequate volume   Culture   Final    NO GROWTH 5 DAYS Performed at [facility], [place] [address] [place], [place] [address]    Report Status 01/01/2019 FINAL  Final  Culture, blood (Routine X 2) w Reflex to ID Panel     Status: None (Preliminary result)   Collection Time: 12/28/18 10:00 AM  Result Value Ref Range Status   Specimen Description BLOOD LEFT HAND  Final   Special Requests   Final    BOTTLES DRAWN AEROBIC AND ANAEROBIC Blood Culture adequate volume   Culture   Final    NO GROWTH 4 DAYS Performed at [facility], [place] [address], [place], [place] [address]    Report Status PENDING  Incomplete  Culture, blood (Routine X 2) w Reflex to ID Panel     Status: None (Preliminary result)   Collection Time: 12/28/18 10:15 AM    Result Value Ref Range Status   Specimen Description BLOOD LEFT HAND  Final   Special Requests   Final    BOTTLES DRAWN AEROBIC AND ANAEROBIC Blood Culture adequate volume   Culture   Final    NO GROWTH 4 DAYS Performed at [facility], [place] [address], [place], [place] [address]    Report Status PENDING  Incomplete         Radiology Studies: Ir Fluoro Guide Cv Line Left  Result Date: 12/30/2018 INDICATION: 43 year old with acute renal failure. Recently removed right jugular dialysis catheter and needs new temporary access. EXAM: FLUOROSCOPIC AND ULTRASOUND GUIDED PLACEMENT OF A NON-TUNNELED DIALYSIS CATHETER Physician: Stephan Minister. Henn, MD MEDICATIONS: None ANESTHESIA/SEDATION: None FLUOROSCOPY TIME:  Fluoroscopy Time: 42 seconds, 2 mGy COMPLICATIONS: None immediate. PROCEDURE: Informed consent was obtained for catheter placement. The patient was placed supine on the interventional table. Ultrasound demonstrated focal thrombus in the right internal jugular vein at the old catheter site. Therefore, attention was directed to the left side of the neck. Ultrasound confirmed a patent left internal jugular veinvein. Ultrasound images were obtained for documentation. The left side of the neck was prepped and draped in a sterile fashion. The left side of the neck was anesthetized with  1% lidocaine. Maximal barrier sterile technique was utilized including caps, mask, sterile gowns, sterile gloves, sterile drape, hand hygiene and skin antiseptic. A small incision was made with #11 blade scalpel. A 21 gauge needle directed into the left internal jugular vein with ultrasound guidance. A micropuncture dilator set was placed. A 20 cm Mahurkar catheter was selected. The catheter was advanced over a wire and positioned at the superior cavoatrial junction. Fluoroscopic images were obtained for documentation. Both dialysis lumens were found to aspirate and flush well. The proper amount of heparin was flushed in  both lumens. The central venous lumen was flushed with normal saline. Catheter was sutured to skin. FINDINGS: Catheter tip at the superior cavoatrial junction. Focal thrombus in the right internal jugular vein likely from recent catheter. IMPRESSION: Successful placement of a left jugular non-tunneled dialysis catheter using ultrasound and fluoroscopic guidance. Focal thrombus in the right internal jugular vein was discussed with Dr. Posey Pronto in Nephrology. Electronically Signed   By: Markus Daft M.D.   On: 12/30/2018 16:55   Ir US Guide Vasc Access Left  Result Date: 12/30/2018 INDICATION: 43 year old with acute renal failure. Recently removed right jugular dialysis catheter and needs new temporary access. EXAM: FLUOROSCOPIC AND ULTRASOUND GUIDED PLACEMENT OF A NON-TUNNELED DIALYSIS CATHETER Physician: Stephan Minister. Henn, MD MEDICATIONS: None ANESTHESIA/SEDATION: None FLUOROSCOPY TIME:  Fluoroscopy Time: 42 seconds, 2 mGy COMPLICATIONS: None immediate. PROCEDURE: Informed consent was obtained for catheter placement. The patient was placed supine on the interventional table. Ultrasound demonstrated focal thrombus in the right internal jugular vein at the old catheter site. Therefore, attention was directed to the left side of the neck. Ultrasound confirmed a patent left internal jugular veinvein. Ultrasound images were obtained for documentation. The left side of the neck was prepped and draped in a sterile fashion. The left side of the neck was anesthetized with 1% lidocaine. Maximal barrier sterile technique was utilized including caps, mask, sterile gowns, sterile gloves, sterile drape, hand hygiene and skin antiseptic. A small incision was made with #11 blade scalpel. A 21 gauge needle directed into the left internal jugular vein with ultrasound guidance. A micropuncture dilator set was placed. A 20 cm Mahurkar catheter was selected. The catheter was advanced over a wire and positioned at the superior cavoatrial  junction. Fluoroscopic images were obtained for documentation. Both dialysis lumens were found to aspirate and flush well. The proper amount of heparin was flushed in both lumens. The central venous lumen was flushed with normal saline. Catheter was sutured to skin. FINDINGS: Catheter tip at the superior cavoatrial junction. Focal thrombus in the right internal jugular vein likely from recent catheter. IMPRESSION: Successful placement of a left jugular non-tunneled dialysis catheter using ultrasound and fluoroscopic guidance. Focal thrombus in the right internal jugular vein was discussed with Dr. Posey Pronto in Nephrology. Electronically Signed   By: Markus Daft M.D.   On: 12/30/2018 16:55   Vas [place] Upper Extremity Venous Duplex  Result Date: 01/01/2019 UPPER VENOUS STUDY  Other Indications: Focal thrombus noted in the right IJ at prior catheter site by IR. Comparison Study: No prior study on file for comparison. Performing Technologist: Sharion Dove RVS  Examination Guidelines: A complete evaluation includes B-mode imaging, spectral Doppler, color Doppler, and power Doppler as needed of all accessible portions of each vessel. Bilateral testing is considered an integral part of a complete examination. Limited examinations for reoccurring indications may be performed as noted.  Right Findings: +----------+------------+---------+-----------+----------+----------+  RIGHT      Compressible Phasicity Spontaneous  Properties  Summary    +----------+------------+---------+-----------+----------+----------+  IJV          Partial       Yes        Yes                            +----------+------------+---------+-----------+----------+----------+  Subclavian     Full        Yes        Yes                            +----------+------------+---------+-----------+----------+----------+  Axillary                   Yes        Yes                            +----------+------------+---------+-----------+----------+----------+   Brachial       Full        Yes        Yes                            +----------+------------+---------+-----------+----------+----------+  Radial                                                   visualized  +----------+------------+---------+-----------+----------+----------+  Ulnar                                                    visualized  +----------+------------+---------+-----------+----------+----------+  Cephalic       Full                                                  +----------+------------+---------+-----------+----------+----------+  Basilic        Full                                                  +----------+------------+---------+-----------+----------+----------+  Left Findings: +----------+------------+---------+-----------+----------+-------+  LEFT       Compressible Phasicity Spontaneous Properties Summary  +----------+------------+---------+-----------+----------+-------+  Subclavian                 Yes        Yes                         +----------+------------+---------+-----------+----------+-------+  Summary:  Right: Findings consistent with age indeterminate deep vein thrombosis involving the right internal jugular veins.  Left: No evidence of thrombosis in the subclavian.  *See table(s) above for measurements and observations.    Preliminary         Scheduled Meds:  amLODipine  10 mg Oral Daily   Chlorhexidine Gluconate Cloth  6 each Topical Q0600   feeding supplement (ENSURE ENLIVE)  237 mL  Oral BID BM   FLUoxetine  40 mg Oral Daily   folic acid  1 mg Oral Daily   heparin  5,000 Units Subcutaneous Q8H   loxapine  25 mg Oral Daily   loxapine  50 mg Oral QHS   metoprolol tartrate  25 mg Oral BID   polyethylene glycol  17 g Oral Daily   risperiDONE  9 mg Oral QHS   senna-docusate  1 tablet Oral BID   thiamine  100 mg Oral Daily   topiramate  250 mg Oral QHS   traZODone  300 mg Oral QHS   Continuous Infusions:  sodium chloride     sodium  chloride     sodium chloride Stopped (12/26/18 0534)    ceFAZolin (ANCEF) IV 1 g (12/31/18 1900)     LOS: 13 days    Time spent: 35 minutes    Irine Seal, MD [facility]  If 7PM-7AM, please contact night-coverage [email or web address] 01/01/2019, 12:34 PM

## 2019-01-01 NOTE — Progress Notes (Signed)
ANTICOAGULATION CONSULT NOTE - Initial Consult  Pharmacy Consult for heparin Indication: DVT  No Known Allergies  Patient Measurements: Height: 6\' 3"  (190.5 cm) Weight: 259 lb 0.7 oz (117.5 kg) IBW/kg (Calculated) : 84.5 Heparin Dosing Weight: 111kg  Vital Signs: Temp: 98 F (36.7 C) (04/19 1322) Temp Source: Oral (04/19 0705) BP: 114/73 (04/19 1322) Pulse Rate: 62 (04/19 1322)  Labs: Recent Labs    12/30/18 0237 12/31/18 0229 01/01/19 0421  HGB 8.3* 8.4* 8.3*  HCT 25.4* 25.3* 24.9*  PLT 326 345 362  CREATININE 8.03* 5.11* 4.25*    Estimated Creatinine Clearance: 30.3 mL/min (A) (by C-G formula based on SCr of 4.25 mg/dL (H)).   Medical History: Past Medical History:  Diagnosis Date  . Bipolar 1 disorder ([place])   . Cellulitis 08/24/2012   "RLE and spot on my right forearm" (08/24/2012)  . Type II diabetes mellitus ([place]) ~ 2009   "take Metformin" (08/24/2012)    Assessment: 69 yoM admitted with rhabdomyolysis now with R IJ thrombosis. Pharmacy asked to start IV heparin. Pt has been on subcutaneous heparin for VTE prophylaxis (last dose 1436), no OAC PTA. H/H low but stable, will give reduced bolus dose given recent SQ heparin shot and renal failure.  Goal of Therapy:  Heparin level 0.3-0.7 units/ml Monitor platelets by anticoagulation protocol: Yes   Plan:  Heparin 3000 units x1 then 1650 units/hr Check 8hr heparin level Daily heparin level and CBC  Arrie Senate, PharmD, BCPS Clinical Pharmacist [phone] Please check AMION for all [place] numbers 01/01/2019

## 2019-01-02 ENCOUNTER — Inpatient Hospital Stay (HOSPITAL_COMMUNITY): Payer: Medicare HMO

## 2019-01-02 ENCOUNTER — Encounter (HOSPITAL_COMMUNITY): Payer: Self-pay | Admitting: Interventional Radiology

## 2019-01-02 DIAGNOSIS — I82C11 Acute embolism and thrombosis of right internal jugular vein: Secondary | ICD-10-CM

## 2019-01-02 DIAGNOSIS — K760 Fatty (change of) liver, not elsewhere classified: Secondary | ICD-10-CM

## 2019-01-02 HISTORY — PX: IR US GUIDE VASC ACCESS RIGHT: IMG2390

## 2019-01-02 HISTORY — PX: IR FLUORO GUIDE CV LINE RIGHT: IMG2283

## 2019-01-02 LAB — CULTURE, BLOOD (ROUTINE X 2)
Culture: NO GROWTH
Culture: NO GROWTH
Special Requests: ADEQUATE
Special Requests: ADEQUATE

## 2019-01-02 LAB — RENAL FUNCTION PANEL
Albumin: 2.2 g/dL — ABNORMAL LOW (ref 3.5–5.0)
Anion gap: 9 (ref 5–15)
BUN: 29 mg/dL — ABNORMAL HIGH (ref 6–20)
CO2: 28 mmol/L (ref 22–32)
Calcium: 8.2 mg/dL — ABNORMAL LOW (ref 8.9–10.3)
Chloride: 93 mmol/L — ABNORMAL LOW (ref 98–111)
Creatinine, Ser: 5.81 mg/dL — ABNORMAL HIGH (ref 0.61–1.24)
GFR calc Af Amer: 12 mL/min — ABNORMAL LOW (ref >60)
GFR calc non Af Amer: 11 mL/min — ABNORMAL LOW (ref >60)
Glucose, Bld: 85 mg/dL (ref 70–99)
Phosphorus: 4.9 mg/dL — ABNORMAL HIGH (ref 2.5–4.6)
Potassium: 4.5 mmol/L (ref 3.5–5.1)
Sodium: 130 mmol/L — ABNORMAL LOW (ref 135–145)

## 2019-01-02 LAB — CBC
HCT: 24.2 % — ABNORMAL LOW (ref 39.0–52.0)
Hemoglobin: 8.3 g/dL — ABNORMAL LOW (ref 13.0–17.0)
MCH: 31.6 pg (ref 26.0–34.0)
MCHC: 34.3 g/dL (ref 30.0–36.0)
MCV: 92 fL (ref 80.0–100.0)
Platelets: 339 10*3/uL (ref 150–400)
RBC: 2.63 MIL/uL — ABNORMAL LOW (ref 4.22–5.81)
RDW: 12.3 % (ref 11.5–15.5)
WBC: 7.7 10*3/uL (ref 4.0–10.5)
nRBC: 0 % (ref 0.0–0.2)

## 2019-01-02 LAB — GLUCOSE, CAPILLARY
Glucose-Capillary: 75 mg/dL (ref 70–99)
Glucose-Capillary: 76 mg/dL (ref 70–99)

## 2019-01-02 LAB — HEPARIN LEVEL (UNFRACTIONATED): Heparin Unfractionated: 0.22 IU/mL — ABNORMAL LOW (ref 0.30–0.70)

## 2019-01-02 MED ORDER — HEPARIN SODIUM (PORCINE) 1000 UNIT/ML IJ SOLN
INTRAMUSCULAR | Status: AC | PRN
  Administered 2019-01-02: 1000 [IU] via INTRAVENOUS

## 2019-01-02 MED ORDER — FENTANYL CITRATE (PF) 100 MCG/2ML IJ SOLN
INTRAMUSCULAR | Status: AC | PRN
  Administered 2019-01-02 (×2): 25 ug via INTRAVENOUS

## 2019-01-02 MED ORDER — CEFAZOLIN SODIUM-DEXTROSE 2-4 GM/100ML-% IV SOLN
2.0000 g | INTRAVENOUS | Status: AC
  Administered 2019-01-02: 2 g via INTRAVENOUS

## 2019-01-02 MED ORDER — MIDAZOLAM HCL 2 MG/2ML IJ SOLN
INTRAMUSCULAR | Status: AC | PRN
  Administered 2019-01-02: 1 mg via INTRAVENOUS

## 2019-01-02 MED ORDER — CEFAZOLIN SODIUM-DEXTROSE 1-4 GM/50ML-% IV SOLN
1.0000 g | INTRAVENOUS | Status: DC
  Administered 2019-01-03 – 2019-01-04 (×2): 1 g via INTRAVENOUS
  Filled 2019-01-02 (×3): qty 50

## 2019-01-02 MED ORDER — FENTANYL CITRATE (PF) 100 MCG/2ML IJ SOLN
INTRAMUSCULAR | Status: AC
  Filled 2019-01-02: qty 2

## 2019-01-02 MED ORDER — CEFAZOLIN SODIUM-DEXTROSE 2-4 GM/100ML-% IV SOLN
INTRAVENOUS | Status: AC
  Filled 2019-01-02: qty 100

## 2019-01-02 MED ORDER — CHLORHEXIDINE GLUCONATE CLOTH 2 % EX PADS
6.0000 | MEDICATED_PAD | Freq: Every day | CUTANEOUS | Status: DC
  Administered 2019-01-03 – 2019-01-04 (×2): 6 via TOPICAL

## 2019-01-02 MED ORDER — LIDOCAINE HCL 1 % IJ SOLN
INTRAMUSCULAR | Status: AC | PRN
  Administered 2019-01-02: 10 mL

## 2019-01-02 MED ORDER — HEPARIN SODIUM (PORCINE) 1000 UNIT/ML IJ SOLN
INTRAMUSCULAR | Status: AC
  Filled 2019-01-02: qty 1

## 2019-01-02 MED ORDER — HEPARIN (PORCINE) 25000 UT/250ML-% IV SOLN
2300.0000 [IU]/h | INTRAVENOUS | Status: DC
  Administered 2019-01-02: 1850 [IU]/h via INTRAVENOUS
  Administered 2019-01-03 (×2): 2000 [IU]/h via INTRAVENOUS
  Administered 2019-01-04 – 2019-01-08 (×10): 2300 [IU]/h via INTRAVENOUS
  Filled 2019-01-02 (×14): qty 250

## 2019-01-02 MED ORDER — MIDAZOLAM HCL 2 MG/2ML IJ SOLN
INTRAMUSCULAR | Status: AC
  Filled 2019-01-02: qty 2

## 2019-01-02 MED ORDER — LIDOCAINE HCL 1 % IJ SOLN
INTRAMUSCULAR | Status: AC
  Filled 2019-01-02: qty 20

## 2019-01-02 NOTE — Care Management Important Message (Signed)
Important Message  Patient Details  Name: James [name] MRN: [id number] Date of Birth: [birth year]   Medicare Important Message Given:  Yes    [name] 01/02/2019, 3:36 PM

## 2019-01-02 NOTE — Progress Notes (Addendum)
[place] for Heparin Indication: DVT  No Known Allergies  Patient Measurements: Height: 6\' 3"  (190.5 cm) Weight: 254 lb 10.1 oz (115.5 kg) IBW/kg (Calculated) : 84.5 Heparin Dosing Weight: 111kg  Vital Signs: Temp: 98.5 F (36.9 C) (04/20 0542) Temp Source: Oral (04/20 0542) BP: 134/91 (04/20 1300) Pulse Rate: 52 (04/20 1300)  Labs: Recent Labs    12/31/18 0229 01/01/19 0421 01/02/19 0214  HGB 8.4* 8.3* 8.3*  HCT 25.3* 24.9* 24.2*  PLT 345 362 339  HEPARINUNFRC  --   --  0.22*  CREATININE 5.11* 4.25* 5.81*    Estimated Creatinine Clearance: 22 mL/min (A) (by C-G formula based on SCr of 5.81 mg/dL (H)).    Assessment: 27 yoM admitted with rhabdomyolysis now with R IJ thrombosis. Pharmacy asked to dose  IV heparin.  S/p TDC placement today - heparin to resume at 1900 pm  Goal of Therapy:  Heparin level 0.3-0.7 units/ml Monitor platelets by anticoagulation protocol: Yes   Plan:  Heparin drip to resume at 7pm at 1850 units / hr Check 8hr heparin level at 3 am Daily heparin level and CBC  Thanks for allowing pharmacy to be a part of this patient's care.  Anette Guarneri, PharmD [phone] Clinical Pharmacist

## 2019-01-02 NOTE — Progress Notes (Signed)
Patient ID: James [name], male   DOB: 1976/02/16, 43 y.o.   MRN: [id number] [facility] Progress Note   Assessment/ Plan:   1. Acute kidney Injury:  - Assess dialysis needs daily - Plan for next HD on 4/21  - Consult IR to convert to tunneled dialysis catheter.  Order placed and made pt NPO for procedure - Recovery may be slow and protracted based on severity of injury.    2.  Volume overload: Oliguric and on intermittent dialysis with ultrafiltration. 3.  Bipolar disorder/anxiety: Ongoing management per primary service. 4.  History of alcohol abuse: We will continue to monitor closely for withdrawal symptoms, initial ultrasound suggestive of early development of cirrhosis (suspect fatty liver has a contributory effect). 5.  Hyponatremia: Secondary to acute kidney injury and unrestricted oral fluid intake.  Fluid restrict and HD as above  6. MSSA bacteremia: On intravenous cefazolin adjusted with dialysis.  Status post catheter holiday. Last blood cultures NGTD  7.  Right IJ thrombus: on heparin gtt Subjective:   Last HD on 4/18 with reported 4 kg UF.  Note 600 mL UOP over 4/19.  Review of systems:  Denies shortness of breath, chest pain Denies nausea or vomiting     Objective:   BP 139/82 (BP Location: Left Arm)   Pulse 68   Temp 98.5 F (36.9 C) (Oral)   Resp 18   Ht 6\' 3"  (1.905 m)   Wt 115.5 kg   SpO2 93%   BMI 31.83 kg/m   Intake/Output Summary (Last 24 hours) at 01/02/2019 0756 Last data filed at 01/02/2019 2355 Gross per 24 hour  Intake 1256.68 ml  Output 625 ml  Net 631.68 ml   Weight change: -5.8 kg  Physical Exam: Gen: adult male in bed in NAD at rest CVS: RRR; no rub Resp: clear and unlabored Abd: Soft, obese, nontender Ext: 2+ edema lower extremities.  Imaging: Vas [place] Upper Extremity Venous Duplex  Result Date: 01/01/2019 UPPER VENOUS STUDY  Other Indications: Focal thrombus noted in the right IJ at prior catheter site by IR.  Comparison Study: No prior study on file for comparison. Performing Technologist: Sharion Dove RVS  Examination Guidelines: A complete evaluation includes B-mode imaging, spectral Doppler, color Doppler, and power Doppler as needed of all accessible portions of each vessel. Bilateral testing is considered an integral part of a complete examination. Limited examinations for reoccurring indications may be performed as noted.  Right Findings: +----------+------------+---------+-----------+----------+----------+ RIGHT     CompressiblePhasicitySpontaneousProperties Summary   +----------+------------+---------+-----------+----------+----------+ IJV         Partial      Yes       Yes                         +----------+------------+---------+-----------+----------+----------+ Subclavian    Full       Yes       Yes                         +----------+------------+---------+-----------+----------+----------+ Axillary                 Yes       Yes                         +----------+------------+---------+-----------+----------+----------+ Brachial      Full       Yes       Yes                         +----------+------------+---------+-----------+----------+----------+  Radial                                              visualized +----------+------------+---------+-----------+----------+----------+ Ulnar                                               visualized +----------+------------+---------+-----------+----------+----------+ Cephalic      Full                                             +----------+------------+---------+-----------+----------+----------+ Basilic       Full                                             +----------+------------+---------+-----------+----------+----------+  Left Findings: +----------+------------+---------+-----------+----------+-------+ LEFT      CompressiblePhasicitySpontaneousPropertiesSummary  +----------+------------+---------+-----------+----------+-------+ Subclavian               Yes       Yes                      +----------+------------+---------+-----------+----------+-------+  Summary:  Right: Findings consistent with age indeterminate deep vein thrombosis involving the right internal jugular veins.  Left: No evidence of thrombosis in the subclavian.  *See table(s) above for measurements and observations.  Diagnosing physician: Servando Snare MD Electronically signed by Servando Snare MD on 01/01/2019 at 12:37:19 PM.    Final     Labs: BMET Recent Labs  Lab 12/27/18 0505 12/28/18 3845 12/29/18 3646 12/30/18 0237 12/31/18 0229 01/01/19 0421 01/02/19 0214  NA 129* 132* 131* 128* 130* 133* 130*  K 4.6 4.3 4.4 4.7 4.0 4.2 4.5  CL 92* 95* 94* 92* 92* 95* 93*  CO2 26 27 25 24 24 29 28   GLUCOSE 88 88 88 82 80 84 85  BUN 41* 31* 48* 58* 31* 19 29*  CREATININE 6.26* 5.07* 6.82* 8.03* 5.11* 4.25* 5.81*  CALCIUM 8.2* 8.1* 8.2* 8.2* 7.9* 8.2* 8.2*  PHOS  --   --   --  5.5* 3.8 4.2 4.9*   CBC Recent Labs  Lab 12/28/18 0307  12/30/18 0237 12/31/18 0229 01/01/19 0421 01/02/19 0214  WBC 8.0   < > 7.1 7.8 6.9 7.7  NEUTROABS 5.7  --  4.6 5.3 4.4  --   HGB 8.5*   < > 8.3* 8.4* 8.3* 8.3*  HCT 25.0*   < > 25.4* 25.3* 24.9* 24.2*  MCV 91.6   < > 91.4 90.0 94.0 92.0  PLT 272   < > 326 345 362 339   < > = values in this interval not displayed.    Medications:    . amLODipine  10 mg Oral Daily  . Chlorhexidine Gluconate Cloth  6 each Topical Q0600  . feeding supplement (ENSURE ENLIVE)  237 mL Oral BID BM  . FLUoxetine  40 mg Oral Daily  . folic acid  1 mg Oral Daily  . loxapine  25 mg Oral Daily  . loxapine  50 mg Oral QHS  . metoprolol tartrate  25 mg Oral  BID  . polyethylene glycol  17 g Oral Daily  . risperiDONE  9 mg Oral QHS  . senna-docusate  1 tablet Oral BID  . thiamine  100 mg Oral Daily  . topiramate  250 mg Oral QHS  . traZODone  300 mg Oral QHS   Claudia Desanctis 01/02/2019 8:13 am

## 2019-01-02 NOTE — Progress Notes (Addendum)
[place] for Infectious Disease  Date of Admission:  12/19/2018     Total days of antibiotics:  9   Day 9 Cefazolin            Patient ID: James [name] is a 43 y.o. male with  Principal Problem:   MSSA bacteremia Active Problems:   Diabetes mellitus ([place])   Bipolar affective disorder ([place])   ARF (acute renal failure) ([place])   Alcoholism ([place])   AKI (acute kidney injury) ([place])   Hemodialysis catheter infection ([place])   Traumatic rhabdomyolysis ([place])   Thrombosis of right internal jugular vein ([place])   . amLODipine  10 mg Oral Daily  . Chlorhexidine Gluconate Cloth  6 each Topical Q0600  . feeding supplement (ENSURE ENLIVE)  237 mL Oral BID BM  . FLUoxetine  40 mg Oral Daily  . folic acid  1 mg Oral Daily  . loxapine  25 mg Oral Daily  . loxapine  50 mg Oral QHS  . metoprolol tartrate  25 mg Oral BID  . polyethylene glycol  17 g Oral Daily  . risperiDONE  9 mg Oral QHS  . senna-docusate  1 tablet Oral BID  . thiamine  100 mg Oral Daily  . topiramate  250 mg Oral QHS  . traZODone  300 mg Oral QHS    SUBJECTIVE: No complaints. Has a clot in the right neck - he reports no pain but a 'tight' feeling and a 'knot.' Heparin drip has since been started for treatment.  No fevers/chills. He has a temp cath in his left neck - awaiting tunneled HD cath placement currently. He is scheduled to get HD tomorrow 4/21.    Review of Systems: Review of Systems  Constitutional: Negative for fever and malaise/fatigue.  Respiratory: Negative for shortness of breath.   Cardiovascular: Positive for leg swelling. Negative for chest pain.  Gastrointestinal: Negative for abdominal pain, diarrhea and vomiting.  Genitourinary:       Oliguric  Musculoskeletal: Negative for myalgias.  Skin: Negative for rash.  Neurological: Negative for weakness.  All other systems reviewed and are negative.   No Known Allergies  OBJECTIVE: Vitals:   01/01/19 1015 01/01/19 1322  01/01/19 2202 01/02/19 0542  BP: (!) 142/86 114/73 134/80 139/82  Pulse: 60 62 (!) 58 68  Resp: 16 18 18 18   Temp:  98 F (36.7 C) 97.9 F (36.6 C) 98.5 F (36.9 C)  TempSrc:    Oral  SpO2: 96% 98% 94% 93%  Weight:    115.5 kg  Height:       Body mass index is 31.83 kg/m.   Physical Exam Vitals signs and nursing note reviewed.  Constitutional:      Comments: Resting comfortably in bed. No distress.   HENT:     Mouth/Throat:     Mouth: Mucous membranes are moist.     Pharynx: Oropharynx is clear.  Eyes:     General: No scleral icterus.    Pupils: Pupils are equal, round, and reactive to light.  Neck:     Comments: R neck with palpable nodule along jugular vein. No erythema, warmth or tenderness.  L IJ with temporary HD line.  Cardiovascular:     Rate and Rhythm: Normal rate.     Pulses: Normal pulses.     Heart sounds: Murmur (RUSB 1-2/6) present.  Pulmonary:     Effort: Pulmonary effort is normal.     Breath sounds:  Normal breath sounds.  Abdominal:     General: Abdomen is flat.  Musculoskeletal:        General: Swelling (generalized) present.  Skin:    General: Skin is warm and dry.     Capillary Refill: Capillary refill takes less than 2 seconds.     Comments: Multiple areas of scabbed over abrasions as previously documented. Remains generally edematous.   Neurological:     Mental Status: He is oriented to person, place, and time.     Lab Results Lab Results  Component Value Date   WBC 7.7 01/02/2019   HGB 8.3 (L) 01/02/2019   HCT 24.2 (L) 01/02/2019   MCV 92.0 01/02/2019   PLT 339 01/02/2019    Lab Results  Component Value Date   CREATININE 5.81 (H) 01/02/2019   BUN 29 (H) 01/02/2019   NA 130 (L) 01/02/2019   K 4.5 01/02/2019   CL 93 (L) 01/02/2019   CO2 28 01/02/2019    Lab Results  Component Value Date   ALT 12 12/30/2018   AST 41 12/30/2018   ALKPHOS 84 12/30/2018   BILITOT 0.5 12/30/2018     Microbiology: BCx 4/12 >> MSSA 2/2 sets  BCx 4/14 >> pending (line still in) BCx 4/15 >> not yet drawn   ASSESSMENT & PLAN:   1. MSSA Bacteremia = Fever on hospital day 8 and found to be bacteremic. TTE negative for IE and only mild MV regurg and trivial TV regurgitation. Exam otherwise unrevealing for cause - likely related to HD line.  Repeated blood cultures remain negative and no other signs of metastatic infection. New finding of R IJ thrombus. I have a concern for possible seeding of this and may need to extend out antibiotics to cover for possible septic thrombophlebitis considering he will have a perm cath tunneled.   2. AKI with Acute HD Need = urine output picking up. CK improving. Nephrology is following closely and assessing daily for HD needs, hopeful for long term recovery but anticipated to be slow. HD 3x/week.  3. ?Early Cirrhosis = hep b antigen negative. Hep c Ab negative. Likely due to fatty liver disease and excessive/frequent alcohol consumption.   4. R IJ Thrombus = currently getting anticoagulation; in light of this finding, which is a new development compared to previous imaging studies. Concern detailed in #1. 4 week Cefazolin    Please continue Cefazolin after HD through May 12th to complete 4 weeks of therapy.   Janene Madeira, MSN, NP-C [facility] for Infectious [place] Cell: [phone] Pager: [phone]  01/02/2019  11:15 AM

## 2019-01-02 NOTE — Progress Notes (Signed)
Physical Therapy Treatment Patient Details Name: James [name] MRN: [id number] DOB: 03-28-1976 Today's Date: 01/02/2019    History of Present Illness Pt is a 43 y/o male admitted after fall. Found to have acute renal failure secondary to rhabdomyolysis. Pt is s/p tunneled HD catheter and started HD on 4/7. PMH includes DM, bipolar disorder, HTN, and alcohol abuse.     PT Comments    Session limited as pt was to be transferred off floor for procedure. We were able to ambulate in hall and continue to note occasional unsteadiness. Pt asking to practice stairs - will incorporate stair training into next PT session. Will continue to follow.    Follow Up Recommendations  Home health PT;Supervision for mobility/OOB     Equipment Recommendations  None recommended by PT    Recommendations for Other Services       Precautions / Restrictions Precautions Precautions: Fall Restrictions Weight Bearing Restrictions: No    Mobility  Bed Mobility Overal bed mobility: Modified Independent Bed Mobility: Supine to Sit     Supine to sit: Modified independent (Device/Increase time)     General bed mobility comments: Increased time and heavy reliance of rails to transition to EOB.   Transfers Overall transfer level: Needs assistance Equipment used: Rolling walker (2 wheeled) Transfers: Sit to/from Stand Sit to Stand: Supervision         General transfer comment: Effortful but pt was able to transition to standing without assistance. Required increased time once standing to "stretch his legs out".   Ambulation/Gait Ambulation/Gait assistance: Supervision;Min guard Gait Distance (Feet): 450 Feet Assistive device: None Gait Pattern/deviations: Step-through pattern;Decreased stride length;Wide base of support Gait velocity: Decreased  Gait velocity interpretation: 1.31 - 2.62 ft/sec, indicative of limited community ambulator General Gait Details: No AD this session. Pt overall  ambulating well without UE support. Occasional min guard assist for safety but grossly supervision.   Stairs             Wheelchair Mobility    Modified Rankin (Stroke Patients Only)       Balance Overall balance assessment: Needs assistance Sitting-balance support: No upper extremity supported;Feet supported Sitting balance-Leahy Scale: Good     Standing balance support: Bilateral upper extremity supported;During functional activity Standing balance-Leahy Scale: Poor                              Cognition Arousal/Alertness: Awake/alert Behavior During Therapy: Flat affect Overall Cognitive Status: Within Functional Limits for tasks assessed                                 General Comments: increased time for processing      Exercises      General Comments        Pertinent Vitals/Pain Pain Assessment: No/denies pain Pain Intervention(s): Monitored during session    Home Living                      Prior Function            PT Goals (current goals can now be found in the care plan section) Acute Rehab PT Goals Patient Stated Goal: To get better at the stairs PT Goal Formulation: With patient Time For Goal Achievement: 01/06/19 Potential to Achieve Goals: Good Progress towards PT goals: Progressing toward goals    Frequency    Min  3X/week      PT Plan Current plan remains appropriate    Co-evaluation              AM-PAC PT "6 Clicks" Mobility   Outcome Measure  Help needed turning from your back to your side while in a flat bed without using bedrails?: None Help needed moving from lying on your back to sitting on the side of a flat bed without using bedrails?: None Help needed moving to and from a bed to a chair (including a wheelchair)?: A Little Help needed standing up from a chair using your arms (e.g., wheelchair or bedside chair)?: A Little Help needed to walk in hospital room?: A Little Help  needed climbing 3-5 steps with a railing? : A Little 6 Click Score: 20    End of Session Equipment Utilized During Treatment: Gait belt Activity Tolerance: Patient tolerated treatment well Patient left: in bed;Other (comment)(Transport present to take pt off floor) Nurse Communication: Mobility status PT Visit Diagnosis: Other abnormalities of gait and mobility (R26.89);Unsteadiness on feet (R26.81);Muscle weakness (generalized) (M62.81)     Time: 1110-1140 PT Time Calculation (min) (ACUTE ONLY): 30 min  Charges:  $Gait Training: 23-37 mins                     Rolinda Roan, PT, DPT Acute Rehabilitation Services Pager: [phone] Office: [phone]    Thelma Comp 01/02/2019, 12:48 PM

## 2019-01-02 NOTE — Consult Note (Signed)
Chief Complaint: Patient was seen in consultation today for temporary to tunneled dialysis catheter placement at the request of Dr Graylon Gunning  Supervising Physician: Aletta Edouard  Patient Status: [facility] - In-pt  History of Present Illness: James [name] is a 43 y.o. male   HTN; DM; Biploar; found down after 2 days of binge alcohol drinking AKI; metabolic acidosis; LFT increased and Rhabdo  Temporary dialysis catheter placed 4/17 Worsening renal failure Has been followed by Dr Shireen Quan recovery --- will need tunneled catheter for longer term use  Dr Posey Pronto note 4/19:  we will pursue conversion of temporary catheter to a tunneled catheter and outpatient dialysis unit placement for acute kidney injury.  Scheduled for conversion/new placement in IT today  Past Medical History:  Diagnosis Date   Bipolar 1 disorder ([place])    Cellulitis 08/24/2012   "RLE and spot on my right forearm" (08/24/2012)   Type II diabetes mellitus ([place]) ~ 2009   "take Metformin" (08/24/2012)    Past Surgical History:  Procedure Laterality Date   EYE SURGERY     FOOT FRACTURE SURGERY     "left; scaffold broke" 08/24/2012)   IR FLUORO GUIDE CV LINE LEFT  12/30/2018   IR FLUORO GUIDE CV LINE RIGHT  12/20/2018   IR US GUIDE VASC ACCESS LEFT  12/30/2018   IR US GUIDE VASC ACCESS RIGHT  12/20/2018   OPEN ANTERIOR SHOULDER RECONSTRUCTION  ~ 2003   "right; kept dislocating; cut it open; sewed muscle back together; moved cartilage around and stapled that" (08/24/2012)    Allergies: Patient has no known allergies.  Medications: Prior to Admission medications   Medication Sig Start Date End Date Taking? Authorizing Provider  atorvastatin (LIPITOR) 40 MG tablet Take 40 mg by mouth at bedtime. 11/10/18  Yes [provider]  clonazePAM (KLONOPIN) 1 MG tablet Take 1 mg by mouth 2 (two) times daily. 11/17/18  Yes [provider]  FLUoxetine (PROZAC) 40 MG capsule Take 40 mg by  mouth every morning.    Yes [provider]  gemfibrozil (LOPID) 600 MG tablet Take 600 mg by mouth 2 (two) times daily. 11/10/18  Yes [provider]  loxapine (LOXITANE) 25 MG capsule Take 25 mg by mouth every morning. 11/29/18  Yes [provider]  loxapine (LOXITANE) 50 MG capsule Take 50 mg by mouth at bedtime. 11/29/18  Yes [provider]  metFORMIN (GLUCOPHAGE) 500 MG tablet Take 500 mg by mouth 2 (two) times daily. 11/10/18  Yes [provider]  risperiDONE (RISPERDAL) 3 MG tablet Take 9 mg by mouth at bedtime.    Yes [provider]  topiramate (TOPAMAX) 100 MG tablet Take 250 mg by mouth at bedtime. 11/29/18  Yes [provider]  traZODone (DESYREL) 100 MG tablet Take 300 mg by mouth at bedtime. 11/29/18  Yes [provider]     Family History  Problem Relation Age of Onset   Diabetic kidney disease Maternal Uncle     Social History   Socioeconomic History   Marital status: Single    Spouse name: Not on file   Number of children: Not on file   Years of education: Not on file   Highest education level: Not on file  Occupational History   Not on file  Social Needs   Financial resource strain: Not on file   Food insecurity:    Worry: Patient refused    Inability: Patient refused   Transportation needs:  Medical: Yes    Non-medical: Yes  Tobacco Use   Smoking status: Former Smoker    Packs/day: 0.00    Years: 25.00    Pack years: 0.00   Smokeless tobacco: Never Used   Tobacco comment: 08/24/2012 "stopped both snuff and chew ~ 10 yr ago"  Substance and Sexual Activity   Alcohol use: Yes    Alcohol/week: 12.0 standard drinks    Types: 12 Cans of beer per week   Drug use: No   Sexual activity: Not Currently  Lifestyle   Physical activity:    Days per week: 0 days    Minutes per session: 0 min   Stress: Not at all  Relationships   Social connections:    Talks on phone: Twice a  week    Gets together: Once a week    Attends religious service: Not on file    Active member of club or organization: Not on file    Attends meetings of clubs or organizations: Not on file    Relationship status: Not on file  Other Topics Concern   Not on file  Social History Narrative   Not on file    Review of Systems: A 12 point ROS discussed and pertinent positives are indicated in the HPI above.  All other systems are negative.  Review of Systems  Constitutional: Positive for activity change and fatigue. Negative for fever.  Respiratory: Negative for shortness of breath.   Cardiovascular: Negative for chest pain.  Neurological: Positive for weakness.  Psychiatric/Behavioral: Negative for behavioral problems and confusion.    Vital Signs: BP 139/82 (BP Location: Left Arm)    Pulse 68    Temp 98.5 F (36.9 C) (Oral)    Resp 18    Ht 6\' 3"  (1.905 m)    Wt 254 lb 10.1 oz (115.5 kg)    SpO2 93%    BMI 31.83 kg/m   Physical Exam Vitals signs reviewed.  Cardiovascular:     Rate and Rhythm: Normal rate and regular rhythm.     Heart sounds: Normal heart sounds.  Pulmonary:     Breath sounds: Normal breath sounds.  Abdominal:     General: Bowel sounds are normal.  Musculoskeletal: Normal range of motion.  Skin:    General: Skin is warm and dry.  Neurological:     Mental Status: He is alert and oriented to person, place, and time.  Psychiatric:        Mood and Affect: Mood normal.        Behavior: Behavior normal.        Thought Content: Thought content normal.        Judgment: Judgment normal.     Imaging: US Renal  Result Date: 12/20/2018 CLINICAL DATA:  43 year old male with a history of renal failure EXAM: RENAL / URINARY TRACT ULTRASOUND COMPLETE COMPARISON:  Ultrasound 12/18/2018, CT 10/25/2012 FINDINGS: Right Kidney: Length: 13.9 cm x 7.5 cm x 6.5 cm, 356 cc. No hydronephrosis. Relatively unremarkable echotexture of the right kidney. Flow confirmed in the hilum  of the right kidney Left Kidney: Length: 13.3 cm x 7.0 cm x 8.0 cm, 391 cc. No hydronephrosis. Relatively unremarkable echotexture of the left kidney. Flow confirmed in the hilum of the left kidney. Bladder: Not visualized IMPRESSION: No hydronephrosis, with relatively unremarkable appearance of the kidneys. Electronically Signed   By: Corrie Mckusick D.O.   On: 12/20/2018 12:53   Ir Fluoro Guide Cv Line Left  Result Date: 12/30/2018 INDICATION:  43 year old with acute renal failure. Recently removed right jugular dialysis catheter and needs new temporary access. EXAM: FLUOROSCOPIC AND ULTRASOUND GUIDED PLACEMENT OF A NON-TUNNELED DIALYSIS CATHETER Physician: Stephan Minister. Henn, MD MEDICATIONS: None ANESTHESIA/SEDATION: None FLUOROSCOPY TIME:  Fluoroscopy Time: 42 seconds, 2 mGy COMPLICATIONS: None immediate. PROCEDURE: Informed consent was obtained for catheter placement. The patient was placed supine on the interventional table. Ultrasound demonstrated focal thrombus in the right internal jugular vein at the old catheter site. Therefore, attention was directed to the left side of the neck. Ultrasound confirmed a patent left internal jugular veinvein. Ultrasound images were obtained for documentation. The left side of the neck was prepped and draped in a sterile fashion. The left side of the neck was anesthetized with 1% lidocaine. Maximal barrier sterile technique was utilized including caps, mask, sterile gowns, sterile gloves, sterile drape, hand hygiene and skin antiseptic. A small incision was made with #11 blade scalpel. A 21 gauge needle directed into the left internal jugular vein with ultrasound guidance. A micropuncture dilator set was placed. A 20 cm Mahurkar catheter was selected. The catheter was advanced over a wire and positioned at the superior cavoatrial junction. Fluoroscopic images were obtained for documentation. Both dialysis lumens were found to aspirate and flush well. The proper amount of heparin  was flushed in both lumens. The central venous lumen was flushed with normal saline. Catheter was sutured to skin. FINDINGS: Catheter tip at the superior cavoatrial junction. Focal thrombus in the right internal jugular vein likely from recent catheter. IMPRESSION: Successful placement of a left jugular non-tunneled dialysis catheter using ultrasound and fluoroscopic guidance. Focal thrombus in the right internal jugular vein was discussed with Dr. Posey Pronto in Nephrology. Electronically Signed   By: Markus Daft M.D.   On: 12/30/2018 16:55   Ir Fluoro Guide Cv Line Right  Result Date: 12/20/2018 CLINICAL DATA:  Rhabdomyolysis, renal failure, needs access for hemodialysis EXAM: EXAM RIGHT IJ CATHETER PLACEMENT UNDER ULTRASOUND AND FLUOROSCOPIC GUIDANCE TECHNIQUE: The procedure, risks (including but not limited to bleeding, infection, organ damage, pneumothorax), benefits, and alternatives were explained to the patient. Questions regarding the procedure were encouraged and answered. The patient understands and consents to the procedure. Patency of the right IJ vein was confirmed with ultrasound with image documentation. An appropriate skin site was determined. Skin site was marked. Region was prepped using maximum barrier technique including cap and mask, sterile gown, sterile gloves, large sterile sheet, and Chlorhexidine as cutaneous antisepsis. The region was infiltrated locally with 1% lidocaine. Under real-time ultrasound guidance, the right IJ vein was accessed with a 21 gauge needle; the needle tip within the vein was confirmed with ultrasound image documentation. The needle exchanged over a 018 guidewire for vascular dilator which allowed advancement of a 20 cm Mahurkar catheter. This was positioned with the tip at the cavoatrial junction. Spot chest radiograph shows good positioning and no pneumothorax. Catheter was flushed and sutured externally with 0-Prolene sutures. Patient tolerated the procedure well.  FLUOROSCOPY TIME:  Less than 0.1 minute; 3 uGym2 DAP COMPLICATIONS: COMPLICATIONS none IMPRESSION: 1. Technically successful right IJ Mahurkar catheter placement. Electronically Signed   By: Lucrezia Europe M.D.   On: 12/20/2018 10:59   Ir US Guide Vasc Access Left  Result Date: 12/30/2018 INDICATION: 43 year old with acute renal failure. Recently removed right jugular dialysis catheter and needs new temporary access. EXAM: FLUOROSCOPIC AND ULTRASOUND GUIDED PLACEMENT OF A NON-TUNNELED DIALYSIS CATHETER Physician: Stephan Minister. Anselm Pancoast, MD MEDICATIONS: None ANESTHESIA/SEDATION: None FLUOROSCOPY TIME:  Fluoroscopy Time:  42 seconds, 2 mGy COMPLICATIONS: None immediate. PROCEDURE: Informed consent was obtained for catheter placement. The patient was placed supine on the interventional table. Ultrasound demonstrated focal thrombus in the right internal jugular vein at the old catheter site. Therefore, attention was directed to the left side of the neck. Ultrasound confirmed a patent left internal jugular veinvein. Ultrasound images were obtained for documentation. The left side of the neck was prepped and draped in a sterile fashion. The left side of the neck was anesthetized with 1% lidocaine. Maximal barrier sterile technique was utilized including caps, mask, sterile gowns, sterile gloves, sterile drape, hand hygiene and skin antiseptic. A small incision was made with #11 blade scalpel. A 21 gauge needle directed into the left internal jugular vein with ultrasound guidance. A micropuncture dilator set was placed. A 20 cm Mahurkar catheter was selected. The catheter was advanced over a wire and positioned at the superior cavoatrial junction. Fluoroscopic images were obtained for documentation. Both dialysis lumens were found to aspirate and flush well. The proper amount of heparin was flushed in both lumens. The central venous lumen was flushed with normal saline. Catheter was sutured to skin. FINDINGS: Catheter tip at the  superior cavoatrial junction. Focal thrombus in the right internal jugular vein likely from recent catheter. IMPRESSION: Successful placement of a left jugular non-tunneled dialysis catheter using ultrasound and fluoroscopic guidance. Focal thrombus in the right internal jugular vein was discussed with Dr. Posey Pronto in Nephrology. Electronically Signed   By: Markus Daft M.D.   On: 12/30/2018 16:55   Ir US Guide Vasc Access Right  Result Date: 12/20/2018 CLINICAL DATA:  Rhabdomyolysis, renal failure, needs access for hemodialysis EXAM: EXAM RIGHT IJ CATHETER PLACEMENT UNDER ULTRASOUND AND FLUOROSCOPIC GUIDANCE TECHNIQUE: The procedure, risks (including but not limited to bleeding, infection, organ damage, pneumothorax), benefits, and alternatives were explained to the patient. Questions regarding the procedure were encouraged and answered. The patient understands and consents to the procedure. Patency of the right IJ vein was confirmed with ultrasound with image documentation. An appropriate skin site was determined. Skin site was marked. Region was prepped using maximum barrier technique including cap and mask, sterile gown, sterile gloves, large sterile sheet, and Chlorhexidine as cutaneous antisepsis. The region was infiltrated locally with 1% lidocaine. Under real-time ultrasound guidance, the right IJ vein was accessed with a 21 gauge needle; the needle tip within the vein was confirmed with ultrasound image documentation. The needle exchanged over a 018 guidewire for vascular dilator which allowed advancement of a 20 cm Mahurkar catheter. This was positioned with the tip at the cavoatrial junction. Spot chest radiograph shows good positioning and no pneumothorax. Catheter was flushed and sutured externally with 0-Prolene sutures. Patient tolerated the procedure well. FLUOROSCOPY TIME:  Less than 0.1 minute; 3 uGym2 DAP COMPLICATIONS: COMPLICATIONS none IMPRESSION: 1. Technically successful right IJ Mahurkar  catheter placement. Electronically Signed   By: Lucrezia Europe M.D.   On: 12/20/2018 10:59   Dg Chest Port 1 View  Result Date: 12/25/2018 CLINICAL DATA:  Dyspnea EXAM: PORTABLE CHEST 1 VIEW COMPARISON:  12/17/2018 chest radiograph. FINDINGS: Right internal jugular central venous catheter terminates in the lower third of the SVC. Stable cardiomediastinal silhouette with normal heart size. No pneumothorax. Slight blunting of the right costophrenic angle. No left pleural effusion. No overt pulmonary edema. No acute consolidative airspace disease. IMPRESSION: 1. No overt pulmonary edema. 2. Slight blunting of the right costophrenic angle suggesting small right pleural effusion. Electronically Signed   By: Rinaldo Ratel  Poff M.D.   On: 12/25/2018 08:38   Vas [place] Upper Extremity Venous Duplex  Result Date: 01/01/2019 UPPER VENOUS STUDY  Other Indications: Focal thrombus noted in the right IJ at prior catheter site by IR. Comparison Study: No prior study on file for comparison. Performing Technologist: Sharion Dove RVS  Examination Guidelines: A complete evaluation includes B-mode imaging, spectral Doppler, color Doppler, and power Doppler as needed of all accessible portions of each vessel. Bilateral testing is considered an integral part of a complete examination. Limited examinations for reoccurring indications may be performed as noted.  Right Findings: +----------+------------+---------+-----------+----------+----------+  RIGHT      Compressible Phasicity Spontaneous Properties  Summary    +----------+------------+---------+-----------+----------+----------+  IJV          Partial       Yes        Yes                            +----------+------------+---------+-----------+----------+----------+  Subclavian     Full        Yes        Yes                            +----------+------------+---------+-----------+----------+----------+  Axillary                   Yes        Yes                             +----------+------------+---------+-----------+----------+----------+  Brachial       Full        Yes        Yes                            +----------+------------+---------+-----------+----------+----------+  Radial                                                   visualized  +----------+------------+---------+-----------+----------+----------+  Ulnar                                                    visualized  +----------+------------+---------+-----------+----------+----------+  Cephalic       Full                                                  +----------+------------+---------+-----------+----------+----------+  Basilic        Full                                                  +----------+------------+---------+-----------+----------+----------+  Left Findings: +----------+------------+---------+-----------+----------+-------+  LEFT       Compressible Phasicity Spontaneous Properties Summary  +----------+------------+---------+-----------+----------+-------+  Subclavian  Yes        Yes                         +----------+------------+---------+-----------+----------+-------+  Summary:  Right: Findings consistent with age indeterminate deep vein thrombosis involving the right internal jugular veins.  Left: No evidence of thrombosis in the subclavian.  *See table(s) above for measurements and observations.  Diagnosing physician: Servando Snare MD Electronically signed by Servando Snare MD on 01/01/2019 at 12:37:19 PM.    Final     Labs:  CBC: Recent Labs    12/30/18 0237 12/31/18 0229 01/01/19 0421 01/02/19 0214  WBC 7.1 7.8 6.9 7.7  HGB 8.3* 8.4* 8.3* 8.3*  HCT 25.4* 25.3* 24.9* 24.2*  PLT 326 345 362 339    COAGS: Recent Labs    12/20/18 0728  INR 1.1    BMP: Recent Labs    12/30/18 0237 12/31/18 0229 01/01/19 0421 01/02/19 0214  NA 128* 130* 133* 130*  K 4.7 4.0 4.2 4.5  CL 92* 92* 95* 93*  CO2 24 24 29 28   GLUCOSE 82 80 84 85  BUN 58* 31* 19 29*    CALCIUM 8.2* 7.9* 8.2* 8.2*  CREATININE 8.03* 5.11* 4.25* 5.81*  GFRNONAA 7* 13* 16* 11*  GFRAA 8* 15* 18* 12*    LIVER FUNCTION TESTS: Recent Labs    12/24/18 0418 12/28/18 0307 12/29/18 0446 12/30/18 0237 12/31/18 0229 01/01/19 0421 01/02/19 0214  BILITOT 0.4 0.5 0.3 0.5  --   --   --   AST 196* 73* 49* 41  --   --   --   ALT 96* 34 17 12  --   --   --   ALKPHOS 48 87 73 84  --   --   --   PROT 4.6* 5.0* 4.9* 5.1*  --   --   --   ALBUMIN 2.0* 2.0* 2.0* 2.1*   2.1* 2.1* 2.1* 2.2*    TUMOR MARKERS: No results for input(s): AFPTM, CEA, CA199, CHROMGRNA in the last 8760 hours.  Assessment and Plan:  AKI; Rhabdo Worsening renal fxn Temp dialysis catheter placed in IR 4/17 Now for conversion to tunneled requested for longer term use Risks and benefits discussed with the patient including, but not limited to bleeding, infection, vascular injury, pneumothorax which may require chest tube placement, air embolism or even death  All of the patient's questions were answered, patient is agreeable to proceed. Consent signed and in chart.   Thank you for this interesting consult.  I greatly enjoyed meeting James [name] and look forward to participating in their care.  A copy of this report was sent to the requesting provider on this date.  Electronically Signed: Lavonia Drafts, PA-C 01/02/2019, 11:54 AM   I spent a total of 20 Minutes    in face to face in clinical consultation, greater than 50% of which was counseling/coordinating care for tunneled HD catheter placement

## 2019-01-02 NOTE — Progress Notes (Signed)
PROGRESS NOTE    James [name]  EUM:[id number] DOB: [birth year] DOA: 12/19/2018 PCP: No primary care provider on file.    Brief Narrative:  43 year old male with history of hypertension, diabetes mellitus type 2, bipolar disorder presented on 12/17/2018 to [facility] after being found down for 2 days following binge alcohol drinking.  He was found to have AKI, metabolic acidosis, leukocytosis and LFT elevation with rhabdomyolysis, CK of 58,000.  He was treated with aggressive IV fluids but unfortunately his renal function got worse with decreased urine output and CK rose to 130,000.  No improvement with IV diuresis.  He was transferred to [facility] on 12/19/2018.  Nephrology was consulted.  Tunneled HD catheter was inserted and hemodialysis was started on 12/20/2018.  HD catheter subsequently removed 12/27/2018.  Patient also noted to have a MSSA bacteremia.  ID consulted.   Assessment & Plan:   Principal Problem:   MSSA bacteremia Active Problems:   Diabetes mellitus ([place])   Bipolar affective disorder ([place])   ARF (acute renal failure) ([place])   Alcoholism ([place])   AKI (acute kidney injury) ([place])   Hemodialysis catheter infection ([place])   Traumatic rhabdomyolysis ([place])   Thrombosis of right internal jugular vein ([place])  1 MSSA bacteremia Questionable etiology.  Concern for possible line infection.  2D echo which was done was negative for infective endocarditis and only mild mitral valvular regurgitation and trivial tricuspid valvular regurgitation.  TEE was unable to be done at this time.  HD catheter line was removed last night 12/27/2018.  Repeat blood cultures have been ordered per ID which are currently pending.  Blood cultures from 12/27/2018 with no growth to date.  ID recommending treating with IV cefazolin x14 days from line removal with day 1 either 12/27/2018 or 12/28/2018 with treatment through 01/10/2019.  ID following and appreciate input and recommendations.  2.   Acute renal failure Felt likely secondary to rhabdomyolysis plus or minus ATN.  Patient had a HD catheter line placed and underwent hemodialysis on Monday and Tuesday followed by removal of dialysis catheter due to concerns might be etiology of MSSA bacteremia.  Patient urine output of 625 cc over the past 24 hours which was recorded.  Creatinine trending back up and down and currently at 8.81 from 4.25 from  5.11 from 8.03 from 6.82 from 5.07 from 6.26 from 7.80 after hemodialysis was resumed..  Per nephrology temporary dialysis catheter was placed on 12/30/2018 and patient resumed back on hemodialysis.  Patient for placement of tunneled HD catheter placement today per interventional radiology.  Per nephrology.   3.  Rhabdomyolysis Improving daily.  Patient noted to have initial presentation with a CK level of 58,000 and treated aggressively with IV fluids however renal function worsened with decreased urine output and worsening CK of 130,000.  Rhabdomyolysis initially was not improving with diuresis. Patient subsequently underwent hemodialysis.  CK levels trended down to 984.  Outpatient follow-up.   4.  Volume overload Improved with hemodialysis initially.  HD catheter has been removed.  Patient still with volume overload noted on examination with scrotal swelling and some lower extremity edema.  Placement of non-tunneled dialysis catheter was done 12/30/2018.  Patient started back up on hemodialysis with improvement with volume status.  4 L removed during last hemodialysis session on 12/31/2018.  Urine output of 625 cc.  Per nephrology.   5.  Alcohol abuse Patient with no signs of alcohol withdrawal.  Was on Librium tapering doses which have been discontinued.  Alcohol cessation has been stressed to patient.  Outpatient follow-up.   6.  Transaminitis Felt secondary to rhabdomyolysis and alcohol use.  Acute hepatitis panel negative.  HIV nonreactive.  Right upper quadrant ultrasound with early cirrhotic  changes noted.  LFTs trended down.  Follow.  7.  Hyponatremia Secondary to hypervolemic hyponatremia secondary to acute kidney injury.  Initially improved with hemodialysis however trending down yesterday and trending back up after patient placed back on hemodialysis.  Status post non-tunneled dialysis catheter 12/30/2018 per interventional radiology.  Patient started back on hemodialysis.  Per nephrology. Follow.  8.  Diabetes mellitus type 2 Hemoglobin A1c of 5.2.  Oral diabetic medications on hold. D/C'd sliding scale insulin.   9.  Elevated troponin Secondary to rhabdomyolysis.  No further interventions at this time.  10.  Maculopapular rash Improved.  11.  Obesity  12.  Bipolar disorder/anxiety Stable.  Continue current regimen of Prozac, Risperdal, Topamax.  Trazodone nightly.  13.  Hypertension Well-controlled on current regimen of Lopressor and Norvasc.   105.  Right IJ thrombus Right upper extremity Dopplers done and consistent with age indeterminant deep vein thrombosis involving the right internal jugular veins.  Patient started on heparin drip.  Case was discussed with nephrology.    DVT prophylaxis: Heparin Code Status: Full Family Communication: Updated patient.  No family at bedside. Disposition Plan: Likely home with home health when clinically stable, improvement with renal function, when okay with nephrology and ID.   Consultants:   Infectious disease: Dr. Rhina Brackett [place] 12/26/2018  Nephrology: Dr. Hollie Salk 12/19/2018  Procedures:   Chest x-ray 12/25/2018  Renal ultrasound 12/20/2018  Right IJ catheter placement under ultrasound and fluoroscopic guidance per Dr.Hassell interventional radiology for 12/20/2018  2D echo 12/26/2018  Placement of non-tunneled dialysis catheter left IJ per Dr. Anselm Pancoast 12/30/2018  Placement of tunneled HD catheter per Dr. Kathlene Cote 01/02/2019  Antimicrobials:   IV Ancef 12/26/2018>>>>> 01/10/2019   Subjective: Patient noted to be  ambulating in hallway with physical therapy.  Patient denies any chest pain.  No shortness of breath.  Patient about to be wheeled down to interventional radiology for tunneled HD catheter placement.   Objective: Vitals:   01/01/19 1015 01/01/19 1322 01/01/19 2202 01/02/19 0542  BP: (!) 142/86 114/73 134/80 139/82  Pulse: 60 62 (!) 58 68  Resp: 16 18 18 18   Temp:  98 F (36.7 C) 97.9 F (36.6 C) 98.5 F (36.9 C)  TempSrc:    Oral  SpO2: 96% 98% 94% 93%  Weight:    115.5 kg  Height:        Intake/Output Summary (Last 24 hours) at 01/02/2019 1157 Last data filed at 01/02/2019 0300 Gross per 24 hour  Intake 1016.68 ml  Output 550 ml  Net 466.68 ml   Filed Weights   12/31/18 1838 01/01/19 0434 01/02/19 0542  Weight: 117 kg 117.5 kg 115.5 kg    Examination:  General exam: NAD Respiratory system: Clear to auscultation bilaterally.  No wheezes, no crackles, no rhonchi.  Normal respiratory effort.  Cardiovascular system: Regular rate rhythm no murmurs rubs or gallops.  No JVD.  2+ bilateral lower extremity edema. Gastrointestinal system: Abdomen is soft, nontender, nondistended, positive bowel sounds.  No rebound.  No guarding.  GU: Scrotal swelling decreasing. Central nervous system: Alert and oriented. No focal neurological deficits. Extremities: 2+ BLE edema Symmetric 5 x 5 power. Skin: No rashes, lesions or ulcers Psychiatry: Judgement and insight appear fair. Mood & affect appropriate.  Data Reviewed: I have personally reviewed following labs and imaging studies  CBC: Recent Labs  Lab 12/27/18 0505 12/28/18 0307 12/29/18 0446 12/30/18 0237 12/31/18 0229 01/01/19 0421 01/02/19 0214  WBC 9.0 8.0 6.6 7.1 7.8 6.9 7.7  NEUTROABS 6.9 5.7  --  4.6 5.3 4.4  --   HGB 8.2* 8.5* 8.5* 8.3* 8.4* 8.3* 8.3*  HCT 24.6* 25.0* 25.7* 25.4* 25.3* 24.9* 24.2*  MCV 93.9 91.6 91.5 91.4 90.0 94.0 92.0  PLT 242 272 291 326 345 362 128   Basic Metabolic Panel: Recent Labs  Lab  12/27/18 0505 12/28/18 0307 12/29/18 0446 12/30/18 0237 12/31/18 0229 01/01/19 0421 01/02/19 0214  NA 129* 132* 131* 128* 130* 133* 130*  K 4.6 4.3 4.4 4.7 4.0 4.2 4.5  CL 92* 95* 94* 92* 92* 95* 93*  CO2 26 27 25 24 24 29 28   GLUCOSE 88 88 88 82 80 84 85  BUN 41* 31* 48* 58* 31* 19 29*  CREATININE 6.26* 5.07* 6.82* 8.03* 5.11* 4.25* 5.81*  CALCIUM 8.2* 8.1* 8.2* 8.2* 7.9* 8.2* 8.2*  MG 2.1 2.0  --   --   --   --   --   PHOS  --   --   --  5.5* 3.8 4.2 4.9*   GFR: Estimated Creatinine Clearance: 22 mL/min (A) (by C-G formula based on SCr of 5.81 mg/dL (H)). Liver Function Tests: Recent Labs  Lab 12/28/18 0307 12/29/18 0446 12/30/18 0237 12/31/18 0229 01/01/19 0421 01/02/19 0214  AST 73* 49* 41  --   --   --   ALT 34 17 12  --   --   --   ALKPHOS 87 73 84  --   --   --   BILITOT 0.5 0.3 0.5  --   --   --   PROT 5.0* 4.9* 5.1*  --   --   --   ALBUMIN 2.0* 2.0* 2.1*  2.1* 2.1* 2.1* 2.2*   No results for input(s): LIPASE, AMYLASE in the last 168 hours. No results for input(s): AMMONIA in the last 168 hours. Coagulation Profile: No results for input(s): INR, PROTIME in the last 168 hours. Cardiac Enzymes: Recent Labs  Lab 12/28/18 0307 12/29/18 0446  CKTOTAL 1,446* 984*   BNP (last 3 results) No results for input(s): PROBNP in the last 8760 hours. HbA1C: No results for input(s): HGBA1C in the last 72 hours. CBG: Recent Labs  Lab 12/30/18 1206 12/30/18 1656 12/31/18 0757 12/31/18 1209 12/31/18 2231  GLUCAP 93 71 80 95 110*   Lipid Profile: No results for input(s): CHOL, HDL, LDLCALC, TRIG, CHOLHDL, LDLDIRECT in the last 72 hours. Thyroid Function Tests: No results for input(s): TSH, T4TOTAL, FREET4, T3FREE, THYROIDAB in the last 72 hours. Anemia Panel: No results for input(s): VITAMINB12, FOLATE, FERRITIN, TIBC, IRON, RETICCTPCT in the last 72 hours. Sepsis Labs: No results for input(s): PROCALCITON, LATICACIDVEN in the last 168 hours.  Recent Results  (from the past 240 hour(s))  Culture, blood (routine x 2)     Status: Abnormal   Collection Time: 12/25/18 10:00 AM  Result Value Ref Range Status   Specimen Description BLOOD LEFT HAND  Final   Special Requests AEROBIC BOTTLE ONLY Blood Culture adequate volume  Final   Culture  Setup Time   Final    GRAM POSITIVE COCCI IN CLUSTERS AEROBIC BOTTLE ONLY CRITICAL VALUE NOTED.  VALUE IS CONSISTENT WITH PREVIOUSLY REPORTED AND CALLED VALUE.    Culture (A)  Final  STAPHYLOCOCCUS AUREUS SUSCEPTIBILITIES PERFORMED ON PREVIOUS CULTURE WITHIN THE LAST 5 DAYS. Performed at [facility], [place] [address] [place], [place] [address]    Report Status 12/28/2018 FINAL  Final  Culture, blood (routine x 2)     Status: Abnormal   Collection Time: 12/25/18 10:04 AM  Result Value Ref Range Status   Specimen Description BLOOD LEFT HAND  Final   Special Requests AEROBIC BOTTLE ONLY Blood Culture adequate volume  Final   Culture  Setup Time   Final    GRAM POSITIVE COCCI IN CLUSTERS AEROBIC BOTTLE ONLY CRITICAL RESULT CALLED TO, READ BACK BY AND VERIFIED WITH: Salli Real [id number] 12/26/2018 Mena Goes Performed at [facility], [place] [address] [place], [place] [address]    Culture STAPHYLOCOCCUS AUREUS (A)  Final   Report Status 12/28/2018 FINAL  Final   Organism ID, Bacteria STAPHYLOCOCCUS AUREUS  Final      Susceptibility   Staphylococcus aureus - MIC*    CIPROFLOXACIN <=0.5 SENSITIVE Sensitive     ERYTHROMYCIN <=0.25 SENSITIVE Sensitive     GENTAMICIN <=0.5 SENSITIVE Sensitive     OXACILLIN 0.5 SENSITIVE Sensitive     TETRACYCLINE <=1 SENSITIVE Sensitive     VANCOMYCIN <=0.5 SENSITIVE Sensitive     TRIMETH/SULFA <=10 SENSITIVE Sensitive     CLINDAMYCIN <=0.25 SENSITIVE Sensitive     RIFAMPIN <=0.5 SENSITIVE Sensitive     Inducible Clindamycin NEGATIVE Sensitive     * STAPHYLOCOCCUS AUREUS  Blood Culture ID Panel (Reflexed)     Status: Abnormal   Collection Time: 12/25/18 10:04 AM   Result Value Ref Range Status   Enterococcus species NOT DETECTED NOT DETECTED Final   Listeria monocytogenes NOT DETECTED NOT DETECTED Final   Staphylococcus species DETECTED (A) NOT DETECTED Final    Comment: CRITICAL RESULT CALLED TO, READ BACK BY AND VERIFIED WITH: G. ABBOTT,PHARMD [id number] 12/26/2018 T. TYSOR    Staphylococcus aureus (BCID) DETECTED (A) NOT DETECTED Final    Comment: Methicillin (oxacillin) susceptible Staphylococcus aureus (MSSA). Preferred therapy is anti staphylococcal beta lactam antibiotic (Cefazolin or Nafcillin), unless clinically contraindicated. CRITICAL RESULT CALLED TO, READ BACK BY AND VERIFIED WITH: G. ABBOTT,PHARMD [id number] 12/26/2018 T. TYSOR    Methicillin resistance NOT DETECTED NOT DETECTED Final   Streptococcus species NOT DETECTED NOT DETECTED Final   Streptococcus agalactiae NOT DETECTED NOT DETECTED Final   Streptococcus pneumoniae NOT DETECTED NOT DETECTED Final   Streptococcus pyogenes NOT DETECTED NOT DETECTED Final   Acinetobacter baumannii NOT DETECTED NOT DETECTED Final   Enterobacteriaceae species NOT DETECTED NOT DETECTED Final   Enterobacter cloacae complex NOT DETECTED NOT DETECTED Final   Escherichia coli NOT DETECTED NOT DETECTED Final   Klebsiella oxytoca NOT DETECTED NOT DETECTED Final   Klebsiella pneumoniae NOT DETECTED NOT DETECTED Final   Proteus species NOT DETECTED NOT DETECTED Final   Serratia marcescens NOT DETECTED NOT DETECTED Final   Haemophilus influenzae NOT DETECTED NOT DETECTED Final   Neisseria meningitidis NOT DETECTED NOT DETECTED Final   Pseudomonas aeruginosa NOT DETECTED NOT DETECTED Final   Candida albicans NOT DETECTED NOT DETECTED Final   Candida glabrata NOT DETECTED NOT DETECTED Final   Candida krusei NOT DETECTED NOT DETECTED Final   Candida parapsilosis NOT DETECTED NOT DETECTED Final   Candida tropicalis NOT DETECTED NOT DETECTED Final    Comment: Performed at [facility], [place]. [address],  [place], [place] [address]  Culture, Urine     Status: Abnormal   Collection Time:  12/25/18  5:00 PM  Result Value Ref Range Status   Specimen Description URINE, CLEAN CATCH  Final   Special Requests   Final    NONE Performed at [facility], [address], [place], [place] [address]    Culture >=100,000 COLONIES/mL STAPHYLOCOCCUS EPIDERMIDIS (A)  Final   Report Status 12/27/2018 FINAL  Final   Organism ID, Bacteria STAPHYLOCOCCUS EPIDERMIDIS (A)  Final      Susceptibility   Staphylococcus epidermidis - MIC*    CIPROFLOXACIN >=8 RESISTANT Resistant     GENTAMICIN <=0.5 SENSITIVE Sensitive     NITROFURANTOIN <=16 SENSITIVE Sensitive     OXACILLIN >=4 RESISTANT Resistant     TETRACYCLINE <=1 SENSITIVE Sensitive     VANCOMYCIN 2 SENSITIVE Sensitive     TRIMETH/SULFA <=10 SENSITIVE Sensitive     CLINDAMYCIN <=0.25 SENSITIVE Sensitive     RIFAMPIN <=0.5 SENSITIVE Sensitive     Inducible Clindamycin NEGATIVE Sensitive     * >=100,000 COLONIES/mL STAPHYLOCOCCUS EPIDERMIDIS  Culture, blood (routine x 2)     Status: None   Collection Time: 12/27/18  7:35 AM  Result Value Ref Range Status   Specimen Description BLOOD LEFT HAND  Final   Special Requests   Final    BOTTLES DRAWN AEROBIC ONLY Blood Culture adequate volume   Culture   Final    NO GROWTH 5 DAYS Performed at [facility], [place] [address], [place], [place] [address]    Report Status 01/01/2019 FINAL  Final  Culture, blood (routine x 2)     Status: None   Collection Time: 12/27/18  7:37 AM  Result Value Ref Range Status   Specimen Description BLOOD LEFT WRIST  Final   Special Requests   Final    BOTTLES DRAWN AEROBIC ONLY Blood Culture adequate volume   Culture   Final    NO GROWTH 5 DAYS Performed at [facility], [place] [address] [place], [place] [address]    Report Status 01/01/2019 FINAL  Final  Culture, blood (Routine X 2) w Reflex to ID Panel     Status: None (Preliminary result)   Collection Time:  12/28/18 10:00 AM  Result Value Ref Range Status   Specimen Description BLOOD LEFT HAND  Final   Special Requests   Final    BOTTLES DRAWN AEROBIC AND ANAEROBIC Blood Culture adequate volume   Culture   Final    NO GROWTH 4 DAYS Performed at [facility], [place] [address] [place] [address] [place], [place] [address]    Report Status PENDING  Incomplete  Culture, blood (Routine X 2) w Reflex to ID Panel     Status: None (Preliminary result)   Collection Time: 12/28/18 10:15 AM  Result Value Ref Range Status   Specimen Description BLOOD LEFT HAND  Final   Special Requests   Final    BOTTLES DRAWN AEROBIC AND ANAEROBIC Blood Culture adequate volume   Culture   Final    NO GROWTH 4 DAYS Performed at [facility], [place] [address] [place], [place] [address]    Report Status PENDING  Incomplete         Radiology Studies: Vas [place] Upper Extremity Venous Duplex  Result Date: 01/01/2019 UPPER VENOUS STUDY  Other Indications: Focal thrombus noted in the right IJ at prior catheter site by IR. Comparison Study: No prior study on file for comparison. Performing Technologist: Sharion Dove RVS  Examination Guidelines: A complete evaluation includes B-mode imaging, spectral  Doppler, color Doppler, and power Doppler as needed of all accessible portions of each vessel. Bilateral testing is considered an integral part of a complete examination. Limited examinations for reoccurring indications may be performed as noted.  Right Findings: +----------+------------+---------+-----------+----------+----------+ RIGHT     CompressiblePhasicitySpontaneousProperties Summary   +----------+------------+---------+-----------+----------+----------+ IJV         Partial      Yes       Yes                         +----------+------------+---------+-----------+----------+----------+ Subclavian    Full       Yes       Yes                          +----------+------------+---------+-----------+----------+----------+ Axillary                 Yes       Yes                         +----------+------------+---------+-----------+----------+----------+ Brachial      Full       Yes       Yes                         +----------+------------+---------+-----------+----------+----------+ Radial                                              visualized +----------+------------+---------+-----------+----------+----------+ Ulnar                                               visualized +----------+------------+---------+-----------+----------+----------+ Cephalic      Full                                             +----------+------------+---------+-----------+----------+----------+ Basilic       Full                                             +----------+------------+---------+-----------+----------+----------+  Left Findings: +----------+------------+---------+-----------+----------+-------+ LEFT      CompressiblePhasicitySpontaneousPropertiesSummary +----------+------------+---------+-----------+----------+-------+ Subclavian               Yes       Yes                      +----------+------------+---------+-----------+----------+-------+  Summary:  Right: Findings consistent with age indeterminate deep vein thrombosis involving the right internal jugular veins.  Left: No evidence of thrombosis in the subclavian.  *See table(s) above for measurements and observations.  Diagnosing physician: Servando Snare MD Electronically signed by Servando Snare MD on 01/01/2019 at 12:37:19 PM.    Final         Scheduled Meds: . amLODipine  10 mg Oral Daily  . Chlorhexidine Gluconate Cloth  6 each Topical Q0600  . feeding supplement (ENSURE ENLIVE)  237 mL Oral BID BM  . FLUoxetine  40 mg Oral  Daily  . folic acid  1 mg Oral Daily  . loxapine  25 mg Oral Daily  . loxapine  50 mg Oral QHS  . metoprolol tartrate  25 mg  Oral BID  . polyethylene glycol  17 g Oral Daily  . risperiDONE  9 mg Oral QHS  . senna-docusate  1 tablet Oral BID  . thiamine  100 mg Oral Daily  . topiramate  250 mg Oral QHS  . traZODone  300 mg Oral QHS   Continuous Infusions: . sodium chloride    . sodium chloride    . sodium chloride Stopped (12/26/18 0534)  .  ceFAZolin (ANCEF) IV 1 g (01/01/19 1813)  . heparin Stopped (01/02/19 0914)     LOS: 14 days    Time spent: 35 minutes    Irine Seal, MD [facility]  If 7PM-7AM, please contact night-coverage [email or web address] 01/02/2019, 11:57 AM

## 2019-01-02 NOTE — Procedures (Signed)
Interventional Radiology Procedure Note  Procedure: Tunneled HD catheter placement  Complications: None  Estimated Blood Loss: < 10 mL  Findings: 23 cm tip to cuff length Palindrome catheter placed via right IJ vein. Tip in RA. Vein lumen now about 50% open with some mural thrombus present. Left temp cath removed.  Venetia Night. Kathlene Cote, M.D Pager:  [phone]

## 2019-01-02 NOTE — TOC Progression Note (Signed)
Transition of Care [facility]) - Progression Note    Patient Details  Name: James [name] MRN: [id number] Date of Birth: Apr 11, 1976  [facility] CM/SW Contact  Royston Bake, RN Phone Number: 01/02/2019, 9:35 AM  Clinical Narrative:    CM following for progression of care; remains on Heparin drip; starting Dialysis 01/03/2019; for Tunnel Cath.   Expected Discharge Plan: [place] Barriers to Discharge: Continued Medical Work up  Expected Discharge Plan and Services Expected Discharge Plan: [place] In-house Referral: Clinical Social Work Discharge Planning Services: CM Consult Post Acute Care Choice: Home Health, Durable Medical Equipment Living arrangements for the past 2 months: Apartment                 DME Arranged: [facility] rolling   HH Arranged: PT, OT HH Agency: [facility]   Social Determinants of Health (SDOH) Interventions    Readmission Risk Interventions No flowsheet data found.

## 2019-01-02 NOTE — Progress Notes (Signed)
ANTICOAGULATION CONSULT NOTE   Pharmacy Consult for heparin Indication: DVT  No Known Allergies  Patient Measurements: Height: 6\' 3"  (190.5 cm) Weight: 259 lb 0.7 oz (117.5 kg) IBW/kg (Calculated) : 84.5 Heparin Dosing Weight: 111kg  Vital Signs: Temp: 97.9 F (36.6 C) (04/19 2202) BP: 134/80 (04/19 2202) Pulse Rate: 58 (04/19 2202)  Labs: Recent Labs    12/31/18 0229 01/01/19 0421 01/02/19 0214  HGB 8.4* 8.3* 8.3*  HCT 25.3* 24.9* 24.2*  PLT 345 362 339  HEPARINUNFRC  --   --  0.22*  CREATININE 5.11* 4.25* 5.81*    Estimated Creatinine Clearance: 22.2 mL/min (A) (by C-G formula based on SCr of 5.81 mg/dL (H)).   Medical History: Past Medical History:  Diagnosis Date  . Bipolar 1 disorder ([place])   . Cellulitis 08/24/2012   "RLE and spot on my right forearm" (08/24/2012)  . Type II diabetes mellitus ([place]) ~ 2009   "take Metformin" (08/24/2012)    Assessment: 89 yoM admitted with rhabdomyolysis now with R IJ thrombosis. Pharmacy asked to start IV heparin. Pt has been on subcutaneous heparin for VTE prophylaxis (last dose 1436), no OAC PTA. H/H low but stable, will give reduced bolus dose given recent SQ heparin shot and renal failure. Initial heparin level 0.22 units/ml.  No bleeding reported  Goal of Therapy:  Heparin level 0.3-0.7 units/ml Monitor platelets by anticoagulation protocol: Yes   Plan:  Increase heparin drip to 1850 units/hr Check 8hr heparin level Daily heparin level and CBC  Thanks for allowing pharmacy to be a part of this patient's care.  Excell Seltzer, PharmD Clinical Pharmacist

## 2019-01-03 LAB — CBC
HCT: 26 % — ABNORMAL LOW (ref 39.0–52.0)
Hemoglobin: 8.4 g/dL — ABNORMAL LOW (ref 13.0–17.0)
MCH: 29.9 pg (ref 26.0–34.0)
MCHC: 32.3 g/dL (ref 30.0–36.0)
MCV: 92.5 fL (ref 80.0–100.0)
Platelets: 343 10*3/uL (ref 150–400)
RBC: 2.81 MIL/uL — ABNORMAL LOW (ref 4.22–5.81)
RDW: 12.1 % (ref 11.5–15.5)
WBC: 7.4 10*3/uL (ref 4.0–10.5)
nRBC: 0 % (ref 0.0–0.2)

## 2019-01-03 LAB — HEPARIN LEVEL (UNFRACTIONATED)
Heparin Unfractionated: 0.17 IU/mL — ABNORMAL LOW (ref 0.30–0.70)
Heparin Unfractionated: 0.29 IU/mL — ABNORMAL LOW (ref 0.30–0.70)

## 2019-01-03 LAB — RENAL FUNCTION PANEL
Albumin: 2.4 g/dL — ABNORMAL LOW (ref 3.5–5.0)
Anion gap: 12 (ref 5–15)
BUN: 44 mg/dL — ABNORMAL HIGH (ref 6–20)
CO2: 25 mmol/L (ref 22–32)
Calcium: 8.5 mg/dL — ABNORMAL LOW (ref 8.9–10.3)
Chloride: 94 mmol/L — ABNORMAL LOW (ref 98–111)
Creatinine, Ser: 7.14 mg/dL — ABNORMAL HIGH (ref 0.61–1.24)
GFR calc Af Amer: 10 mL/min — ABNORMAL LOW (ref >60)
GFR calc non Af Amer: 8 mL/min — ABNORMAL LOW (ref >60)
Glucose, Bld: 83 mg/dL (ref 70–99)
Phosphorus: 6.1 mg/dL — ABNORMAL HIGH (ref 2.5–4.6)
Potassium: 5.2 mmol/L — ABNORMAL HIGH (ref 3.5–5.1)
Sodium: 131 mmol/L — ABNORMAL LOW (ref 135–145)

## 2019-01-03 MED ORDER — HEPARIN SODIUM (PORCINE) 1000 UNIT/ML IJ SOLN
INTRAMUSCULAR | Status: AC
  Administered 2019-01-03: 4800 [IU] via INTRAVENOUS_CENTRAL
  Filled 2019-01-03: qty 4

## 2019-01-03 NOTE — Progress Notes (Signed)
OT Cancellation Note  Patient Details Name: James [name] MRN: [id number] DOB: 1976-06-27   Cancelled Treatment:    Reason Eval/Treat Not Completed: Patient at procedure or test/ unavailable. Pt currently in dialysis.  [name], OTR/L Acute Rehab Services Pager [phone] Office [phone]     Almon Register 01/03/2019, 9:41 AM

## 2019-01-03 NOTE — Progress Notes (Signed)
Pt is still in dialysis. Will complete foley care upon return to unit.

## 2019-01-03 NOTE — Progress Notes (Signed)
Patient ID: James [name], male   DOB: Dec 03, 1975, 43 y.o.   MRN: [id number] [facility] Progress Note   Assessment/ Plan:   1. Acute kidney Injury:  - 2/2 ATN. Hx rhabdo and polysubstance abuse.  nonoliguric - HD today - tunneled catheter placed 4/20 (IR) - Recovery may be slow and protracted based on severity of injury.    2.  Volume overload: HD today  3.  Bipolar disorder/anxiety: management per primary service. 4.  History of alcohol abuse: We will continue to monitor closely for withdrawal symptoms, initial ultrasound suggestive of early development of cirrhosis (suspect fatty liver has a contributory effect). 5.  Hyponatremia: Secondary to acute kidney injury and unrestricted oral fluid intake.  Fluid restrict and HD as above  6. MSSA bacteremia: On intravenous cefazolin adjusted with dialysis.  Status post catheter holiday. Last blood cultures NGTD  7.  Right IJ thrombus: on heparin gtt Subjective:   Seen and examined on dialysis.  Blood pressure 155/83 and HR 51.  Tolerating goal.   He reports that dialysis is going well.  He had a tunneled dialysis catheter placed on 4/20 with IR.  He had 750 ml uop charted over 4/20  Review of systems:  Denies shortness of breath, chest pain Denies nausea or vomiting     Objective:   BP (!) 154/82   Pulse (!) 48   Temp 98.4 F (36.9 C) (Oral)   Resp 16   Ht 6\' 3"  (1.905 m)   Wt 118.1 kg   SpO2 96%   BMI 32.54 kg/m   Intake/Output Summary (Last 24 hours) at 01/03/2019 1014 Last data filed at 01/03/2019 9622 Gross per 24 hour  Intake 644.05 ml  Output 750 ml  Net -105.95 ml   Weight change: 5 kg  Physical Exam: Gen: adult male in bed in NAD at rest CVS: RRR; no rub Resp: clear and unlabored Abd: Soft, obese, nontender Ext: 2+ edema lower extremities bilaterally  Imaging: Ir Fluoro Guide Cv Line Right  Result Date: 01/02/2019 CLINICAL DATA:  End-stage renal disease with current non tunneled  temporary dialysis catheter via the left internal jugular vein. The patient requires placement of a tunneled catheter for long-term use. EXAM: TUNNELED CENTRAL VENOUS HEMODIALYSIS CATHETER PLACEMENT WITH ULTRASOUND AND FLUOROSCOPIC GUIDANCE ANESTHESIA/SEDATION: 1.0 mg IV Versed; 50 mcg IV Fentanyl. Total Moderate Sedation Time:   23 minutes. The patient's level of consciousness and physiologic status were continuously monitored during the procedure by Radiology nursing. MEDICATIONS: 2 g IV Ancef. FLUOROSCOPY TIME:  42 seconds. 6.9 mGy. PROCEDURE: The procedure, risks, benefits, and alternatives were explained to the patient. Questions regarding the procedure were encouraged and answered. The patient understands and consents to the procedure. A timeout was performed prior to initiating the procedure. The right neck and chest were prepped with chlorhexidine in a sterile fashion, and a sterile drape was applied covering the operative field. Maximum barrier sterile technique with sterile gowns and gloves were used for the procedure. Local anesthesia was provided with 1% lidocaine. Ultrasound was performed to confirm patency of the right internal jugular vein. Image documentation was performed. After creating a small venotomy incision, a 21 gauge needle was advanced into the right internal jugular vein under direct, real-time ultrasound guidance. Ultrasound image documentation was performed. After securing guidewire access, an 8 Fr dilator was placed. A J-wire was kinked to measure appropriate catheter length. A Palindrome tunneled hemodialysis catheter measuring 23 cm from tip to cuff was chosen for placement. This  was tunneled in a retrograde fashion from the chest wall to the venotomy incision. At the venotomy, serial dilatation was performed and a 16 Fr peel-away sheath was placed over a guidewire. The catheter was then placed through the sheath and the sheath removed. Final catheter positioning was confirmed and  documented with a fluoroscopic spot image. The catheter was aspirated, flushed with saline, and injected with appropriate volume heparin dwells. The venotomy incision was closed with subcutaneous 3-0 Monocryl and subcuticular 4-0 Vicryl. Dermabond was applied to the incision. The catheter exit site was secured with 0-Prolene retention sutures. After placement of the tunneled catheter, the left-sided non tunneled catheter was removed. COMPLICATIONS: None.  No pneumothorax. FINDINGS: The right internal jugular vein demonstrates nonocclusive thrombus occupying approximately 50% of the lumen of the vein. The vein was patent enough to allow placement of a dialysis catheter. After catheter placement, the tip lies in the right atrium. The catheter aspirates normally and is ready for immediate use. IMPRESSION: Placement of tunneled hemodialysis catheter via the right internal jugular vein. The catheter tip lies in the right atrium. The catheter is ready for immediate use. Electronically Signed   By: Aletta Edouard M.D.   On: 01/02/2019 15:12   Ir US Guide Vasc Access Right  Result Date: 01/02/2019 CLINICAL DATA:  End-stage renal disease with current non tunneled temporary dialysis catheter via the left internal jugular vein. The patient requires placement of a tunneled catheter for long-term use. EXAM: TUNNELED CENTRAL VENOUS HEMODIALYSIS CATHETER PLACEMENT WITH ULTRASOUND AND FLUOROSCOPIC GUIDANCE ANESTHESIA/SEDATION: 1.0 mg IV Versed; 50 mcg IV Fentanyl. Total Moderate Sedation Time:   23 minutes. The patient's level of consciousness and physiologic status were continuously monitored during the procedure by Radiology nursing. MEDICATIONS: 2 g IV Ancef. FLUOROSCOPY TIME:  42 seconds. 6.9 mGy. PROCEDURE: The procedure, risks, benefits, and alternatives were explained to the patient. Questions regarding the procedure were encouraged and answered. The patient understands and consents to the procedure. A timeout was  performed prior to initiating the procedure. The right neck and chest were prepped with chlorhexidine in a sterile fashion, and a sterile drape was applied covering the operative field. Maximum barrier sterile technique with sterile gowns and gloves were used for the procedure. Local anesthesia was provided with 1% lidocaine. Ultrasound was performed to confirm patency of the right internal jugular vein. Image documentation was performed. After creating a small venotomy incision, a 21 gauge needle was advanced into the right internal jugular vein under direct, real-time ultrasound guidance. Ultrasound image documentation was performed. After securing guidewire access, an 8 Fr dilator was placed. A J-wire was kinked to measure appropriate catheter length. A Palindrome tunneled hemodialysis catheter measuring 23 cm from tip to cuff was chosen for placement. This was tunneled in a retrograde fashion from the chest wall to the venotomy incision. At the venotomy, serial dilatation was performed and a 16 Fr peel-away sheath was placed over a guidewire. The catheter was then placed through the sheath and the sheath removed. Final catheter positioning was confirmed and documented with a fluoroscopic spot image. The catheter was aspirated, flushed with saline, and injected with appropriate volume heparin dwells. The venotomy incision was closed with subcutaneous 3-0 Monocryl and subcuticular 4-0 Vicryl. Dermabond was applied to the incision. The catheter exit site was secured with 0-Prolene retention sutures. After placement of the tunneled catheter, the left-sided non tunneled catheter was removed. COMPLICATIONS: None.  No pneumothorax. FINDINGS: The right internal jugular vein demonstrates nonocclusive thrombus occupying approximately 50%  of the lumen of the vein. The vein was patent enough to allow placement of a dialysis catheter. After catheter placement, the tip lies in the right atrium. The catheter aspirates normally  and is ready for immediate use. IMPRESSION: Placement of tunneled hemodialysis catheter via the right internal jugular vein. The catheter tip lies in the right atrium. The catheter is ready for immediate use. Electronically Signed   By: Aletta Edouard M.D.   On: 01/02/2019 15:12    Labs: BMET Recent Labs  Lab 12/28/18 0307 12/29/18 0446 12/30/18 0237 12/31/18 0229 01/01/19 0421 01/02/19 0214 01/03/19 0238  NA 132* 131* 128* 130* 133* 130* 131*  K 4.3 4.4 4.7 4.0 4.2 4.5 5.2*  CL 95* 94* 92* 92* 95* 93* 94*  CO2 27 25 24 24 29 28 25   GLUCOSE 88 88 82 80 84 85 83  BUN 31* 48* 58* 31* 19 29* 44*  CREATININE 5.07* 6.82* 8.03* 5.11* 4.25* 5.81* 7.14*  CALCIUM 8.1* 8.2* 8.2* 7.9* 8.2* 8.2* 8.5*  PHOS  --   --  5.5* 3.8 4.2 4.9* 6.1*   CBC Recent Labs  Lab 12/28/18 0307  12/30/18 0237 12/31/18 0229 01/01/19 0421 01/02/19 0214 01/03/19 0238  WBC 8.0   < > 7.1 7.8 6.9 7.7 7.4  NEUTROABS 5.7  --  4.6 5.3 4.4  --   --   HGB 8.5*   < > 8.3* 8.4* 8.3* 8.3* 8.4*  HCT 25.0*   < > 25.4* 25.3* 24.9* 24.2* 26.0*  MCV 91.6   < > 91.4 90.0 94.0 92.0 92.5  PLT 272   < > 326 345 362 339 343   < > = values in this interval not displayed.    Medications:    . amLODipine  10 mg Oral Daily  . Chlorhexidine Gluconate Cloth  6 each Topical Q0600  . feeding supplement (ENSURE ENLIVE)  237 mL Oral BID BM  . FLUoxetine  40 mg Oral Daily  . folic acid  1 mg Oral Daily  . heparin      . loxapine  25 mg Oral Daily  . loxapine  50 mg Oral QHS  . metoprolol tartrate  25 mg Oral BID  . polyethylene glycol  17 g Oral Daily  . risperiDONE  9 mg Oral QHS  . senna-docusate  1 tablet Oral BID  . thiamine  100 mg Oral Daily  . topiramate  250 mg Oral QHS  . traZODone  300 mg Oral QHS   Claudia Desanctis 01/03/2019 10:15 am

## 2019-01-03 NOTE — Progress Notes (Signed)
ANTICOAGULATION CONSULT NOTE   Pharmacy Consult for Heparin Indication: DVT  No Known Allergies  Patient Measurements: Height: 6\' 3"  (190.5 cm) Weight: 254 lb 10.1 oz (115.5 kg) IBW/kg (Calculated) : 84.5 Heparin Dosing Weight: 111kg  Vital Signs: Temp: 98.2 F (36.8 C) (04/20 2131) Temp Source: Oral (04/20 2131) BP: 135/81 (04/20 2131) Pulse Rate: 54 (04/20 2131)  Labs: Recent Labs    01/01/19 0421 01/02/19 0214 01/03/19 0238  HGB 8.3* 8.3* 8.4*  HCT 24.9* 24.2* 26.0*  PLT 362 339 343  HEPARINUNFRC  --  0.22* 0.17*  CREATININE 4.25* 5.81* 7.14*    Estimated Creatinine Clearance: 17.9 mL/min (A) (by C-G formula based on SCr of 7.14 mg/dL (H)).    Assessment: 89 yoM admitted with rhabdomyolysis now with R IJ thrombosis. Pharmacy asked to dose  IV heparin.  S/p TDC placement today - heparin resumed at 1900 pm.    Goal of Therapy:  Heparin level 0.3-0.7 units/ml Monitor platelets by anticoagulation protocol: Yes   Plan:  Increase heparin drip to 2000 units/hr Check 8hr heparin level Daily heparin level and CBC  Thanks for allowing pharmacy to be a part of this patient's care.  Excell Seltzer, PharmD Clinical Pharmacist

## 2019-01-03 NOTE — Progress Notes (Signed)
Patient Heart rate in the low 50's.  Will hold Metoprolol tonight per Lamar Blinks, NP.  Will continue to monitor the patient and notify as needed

## 2019-01-03 NOTE — Progress Notes (Signed)
PROGRESS NOTE    James [name]  KYH:[id number] DOB: December 26, 1975 DOA: 12/19/2018 PCP: No primary care provider on file.    Brief Narrative:  43 year old male with history of hypertension, diabetes mellitus type 2, bipolar disorder presented on 12/17/2018 to [facility] after being found down for 2 days following binge alcohol drinking.  He was found to have AKI, metabolic acidosis, leukocytosis and LFT elevation with rhabdomyolysis, CK of 58,000.  He was treated with aggressive IV fluids but unfortunately his renal function got worse with decreased urine output and CK rose to 130,000.  No improvement with IV diuresis.  He was transferred to [facility] on 12/19/2018.  Nephrology was consulted.  Tunneled HD catheter was inserted and hemodialysis was started on 12/20/2018.  HD catheter subsequently removed 12/27/2018.  Patient also noted to have a MSSA bacteremia.  ID consulted.   Assessment & Plan:   Principal Problem:   MSSA bacteremia Active Problems:   Diabetes mellitus ([place])   Bipolar affective disorder ([place])   ARF (acute renal failure) ([place])   Alcoholism ([place])   AKI (acute kidney injury) ([place])   Hemodialysis catheter infection ([place])   Traumatic rhabdomyolysis ([place])   Thrombosis of right internal jugular vein ([place])  1 MSSA bacteremia Questionable etiology.  Concern for possible line infection.  2D echo which was done was negative for infective endocarditis and only mild mitral valvular regurgitation and trivial tricuspid valvular regurgitation.  TEE was unable to be done at this time.  HD catheter line was removed last night 12/27/2018.  Repeat blood cultures have been ordered per ID which are currently pending.  Blood cultures from 12/27/2018 with no growth to date.  ID recommending treating with IV cefazolin x14 days from line removal with day 1 either 12/27/2018 or 12/28/2018 with treatment through 01/10/2019.  ID following and appreciate input and recommendations.  2.   Acute renal failure Felt likely secondary to rhabdomyolysis plus or minus ATN.  Patient had a HD catheter line placed and underwent hemodialysis on Monday and Tuesday followed by removal of dialysis catheter due to concerns might be etiology of MSSA bacteremia.  Patient urine output of 750 cc over the past 24 hours which was recorded.  Creatinine trending back up and down and currently at 7.14 from 5.81 from 8.81 from 4.25 from  5.11 from 8.03 from 6.82 from 5.07 from 6.26 from 7.80 after hemodialysis was resumed..  Per nephrology temporary dialysis catheter was placed on 12/30/2018 and patient resumed back on hemodialysis.  Patient s/p placement of tunneled HD catheter placement 01/02/2019, per interventional radiology.  Patient in hemodialysis.  Per nephrology.   3.  Rhabdomyolysis Improving daily.  Patient noted to have initial presentation with a CK level of 58,000 and treated aggressively with IV fluids however renal function worsened with decreased urine output and worsening CK of 130,000.  Rhabdomyolysis initially was not improving with diuresis. Patient subsequently underwent hemodialysis.  CK levels trended down to 984.  Outpatient follow-up.   4.  Volume overload Improved with hemodialysis initially.  HD catheter was removed.  Patient still with volume overload noted on examination with scrotal swelling and some lower extremity edema.  Placement of non-tunneled dialysis catheter was done 12/30/2018.  Patient started back up on hemodialysis with improvement with volume status.  4 L removed during last hemodialysis session on 12/31/2018.  Urine output of 750 cc.  Patient in hemodialysis.  Per nephrology.   5.  Alcohol abuse Patient with no signs of alcohol withdrawal.  Was on Librium tapering doses which have been discontinued.  Alcohol cessation has been stressed to patient.  Outpatient follow-up.   6.  Transaminitis Likely secondary to alcohol use and rhabdomyolysis.  Acute hepatitis panel  negative.  HIV nonreactive. Right upper quadrant ultrasound with early cirrhotic changes noted.  LFTs trended down.  Follow.  7.  Hyponatremia Secondary to hypervolemic hyponatremia secondary to acute kidney injury.  Initially improved with hemodialysis however trending down yesterday and trending back up after patient placed back on hemodialysis.  Status post non-tunneled dialysis catheter 12/30/2018 per interventional radiology.  Patient started back on hemodialysis.  Per nephrology. Follow.  8.  Diabetes mellitus type 2 Hemoglobin A1c of 5.2.  Oral diabetic medications on hold. D/C'd sliding scale insulin.   9.  Elevated troponin Secondary to rhabdomyolysis.  No further interventions at this time.  10.  Maculopapular rash Improved.  11.  Obesity  12.  Bipolar disorder/anxiety Stable.  Continue Prozac, Risperdal, Topamax, trazodone.  13.  Hypertension Well-controlled on current regimen of Lopressor and Norvasc.   68.  Right IJ thrombus Right upper extremity Dopplers done and consistent with age indeterminant deep vein thrombosis involving the right internal jugular veins.  Patient started on heparin drip.  Case was discussed with nephrology.    DVT prophylaxis: Heparin Code Status: Full Family Communication: Updated patient.  No family at bedside. Disposition Plan: Likely home with home health when clinically stable, improvement with renal function, when okay with nephrology and ID.   Consultants:   Infectious disease: Dr. Rhina Brackett [place] 12/26/2018  Nephrology: Dr. Hollie Salk 12/19/2018  Procedures:   Chest x-ray 12/25/2018  Renal ultrasound 12/20/2018  Right IJ catheter placement under ultrasound and fluoroscopic guidance per Dr.Hassell interventional radiology for 12/20/2018  2D echo 12/26/2018  Placement of non-tunneled dialysis catheter left IJ per Dr. Anselm Pancoast 12/30/2018  Placement of tunneled HD catheter per Dr. Kathlene Cote 01/02/2019  Antimicrobials:   IV Ancef  12/26/2018>>>>> 01/10/2019   Subjective: Patient in HD.  Patient denies any chest pain.  No shortness of breath.  No signs of withdrawal noted.  Patient denies any bleeding.  Objective: Vitals:   01/03/19 0900 01/03/19 0930 01/03/19 1000 01/03/19 1030  BP: 137/79 (!) 154/82 (!) 155/83 (!) 155/82  Pulse: (!) 49 (!) 48 (!) 51 (!) 53  Resp:      Temp:      TempSrc:      SpO2:      Weight:      Height:        Intake/Output Summary (Last 24 hours) at 01/03/2019 1056 Last data filed at 01/03/2019 0521 Gross per 24 hour  Intake 644.05 ml  Output 750 ml  Net -105.95 ml   Filed Weights   01/02/19 0542 01/03/19 0453 01/03/19 0645  Weight: 115.5 kg 120.5 kg 118.1 kg    Examination:  General exam: NAD Respiratory system: CTAB anterior lung fields.  No wheezes, no crackles, no rhonchi.  Normal respiratory effort.  Cardiovascular system: RRR no murmurs rubs or gallops.  No JVD.  2+ bilateral lower extremity edema.   Gastrointestinal system: Abdomen is nontender, nondistended, soft, positive bowel sounds.  No rebound.  No guarding.  GU: Scrotal swelling decreasing. Central nervous system: Alert and oriented. No focal neurological deficits. Extremities: 2+ BLE edema Symmetric 5 x 5 power. Skin: No rashes, lesions or ulcers Psychiatry: Judgement and insight appear fair. Mood & affect appropriate.     Data Reviewed: I have personally reviewed following labs and imaging studies  CBC: Recent Labs  Lab 12/28/18 0307  12/30/18 0237 12/31/18 0229 01/01/19 0421 01/02/19 0214 01/03/19 0238  WBC 8.0   < > 7.1 7.8 6.9 7.7 7.4  NEUTROABS 5.7  --  4.6 5.3 4.4  --   --   HGB 8.5*   < > 8.3* 8.4* 8.3* 8.3* 8.4*  HCT 25.0*   < > 25.4* 25.3* 24.9* 24.2* 26.0*  MCV 91.6   < > 91.4 90.0 94.0 92.0 92.5  PLT 272   < > 326 345 362 339 343   < > = values in this interval not displayed.   Basic Metabolic Panel: Recent Labs  Lab 12/28/18 0307  12/30/18 0237 12/31/18 0229 01/01/19 0421  01/02/19 0214 01/03/19 0238  NA 132*   < > 128* 130* 133* 130* 131*  K 4.3   < > 4.7 4.0 4.2 4.5 5.2*  CL 95*   < > 92* 92* 95* 93* 94*  CO2 27   < > 24 24 29 28 25   GLUCOSE 88   < > 82 80 84 85 83  BUN 31*   < > 58* 31* 19 29* 44*  CREATININE 5.07*   < > 8.03* 5.11* 4.25* 5.81* 7.14*  CALCIUM 8.1*   < > 8.2* 7.9* 8.2* 8.2* 8.5*  MG 2.0  --   --   --   --   --   --   PHOS  --   --  5.5* 3.8 4.2 4.9* 6.1*   < > = values in this interval not displayed.   GFR: Estimated Creatinine Clearance: 18.1 mL/min (A) (by C-G formula based on SCr of 7.14 mg/dL (H)). Liver Function Tests: Recent Labs  Lab 12/28/18 0307 12/29/18 0446 12/30/18 0237 12/31/18 0229 01/01/19 0421 01/02/19 0214 01/03/19 0238  AST 73* 49* 41  --   --   --   --   ALT 34 17 12  --   --   --   --   ALKPHOS 87 73 84  --   --   --   --   BILITOT 0.5 0.3 0.5  --   --   --   --   PROT 5.0* 4.9* 5.1*  --   --   --   --   ALBUMIN 2.0* 2.0* 2.1*   2.1* 2.1* 2.1* 2.2* 2.4*   No results for input(s): LIPASE, AMYLASE in the last 168 hours. No results for input(s): AMMONIA in the last 168 hours. Coagulation Profile: No results for input(s): INR, PROTIME in the last 168 hours. Cardiac Enzymes: Recent Labs  Lab 12/28/18 0307 12/29/18 0446  CKTOTAL 1,446* 984*   BNP (last 3 results) No results for input(s): PROBNP in the last 8760 hours. HbA1C: No results for input(s): HGBA1C in the last 72 hours. CBG: Recent Labs  Lab 12/31/18 0757 12/31/18 1209 12/31/18 2231 01/02/19 2150 01/02/19 2215  GLUCAP 80 95 110* 75 76   Lipid Profile: No results for input(s): CHOL, HDL, LDLCALC, TRIG, CHOLHDL, LDLDIRECT in the last 72 hours. Thyroid Function Tests: No results for input(s): TSH, T4TOTAL, FREET4, T3FREE, THYROIDAB in the last 72 hours. Anemia Panel: No results for input(s): VITAMINB12, FOLATE, FERRITIN, TIBC, IRON, RETICCTPCT in the last 72 hours. Sepsis Labs: No results for input(s): PROCALCITON, LATICACIDVEN in  the last 168 hours.  Recent Results (from the past 240 hour(s))  Culture, blood (routine x 2)     Status: Abnormal   Collection Time: 12/25/18 10:00 AM  Result  Value Ref Range Status   Specimen Description BLOOD LEFT HAND  Final   Special Requests AEROBIC BOTTLE ONLY Blood Culture adequate volume  Final   Culture  Setup Time   Final    GRAM POSITIVE COCCI IN CLUSTERS AEROBIC BOTTLE ONLY CRITICAL VALUE NOTED.  VALUE IS CONSISTENT WITH PREVIOUSLY REPORTED AND CALLED VALUE.    Culture (A)  Final    STAPHYLOCOCCUS AUREUS SUSCEPTIBILITIES PERFORMED ON PREVIOUS CULTURE WITHIN THE LAST 5 DAYS. Performed at [facility], [place] [address], [place], [place] [address]    Report Status 12/28/2018 FINAL  Final  Culture, blood (routine x 2)     Status: Abnormal   Collection Time: 12/25/18 10:04 AM  Result Value Ref Range Status   Specimen Description BLOOD LEFT HAND  Final   Special Requests AEROBIC BOTTLE ONLY Blood Culture adequate volume  Final   Culture  Setup Time   Final    GRAM POSITIVE COCCI IN CLUSTERS AEROBIC BOTTLE ONLY CRITICAL RESULT CALLED TO, READ BACK BY AND VERIFIED WITH: Salli Real 2025 12/26/2018 Mena Goes Performed at [facility], [place] [address], [place], [place] [address]    Culture STAPHYLOCOCCUS AUREUS (A)  Final   Report Status 12/28/2018 FINAL  Final   Organism ID, Bacteria STAPHYLOCOCCUS AUREUS  Final      Susceptibility   Staphylococcus aureus - MIC*    CIPROFLOXACIN <=0.5 SENSITIVE Sensitive     ERYTHROMYCIN <=0.25 SENSITIVE Sensitive     GENTAMICIN <=0.5 SENSITIVE Sensitive     OXACILLIN 0.5 SENSITIVE Sensitive     TETRACYCLINE <=1 SENSITIVE Sensitive     VANCOMYCIN <=0.5 SENSITIVE Sensitive     TRIMETH/SULFA <=10 SENSITIVE Sensitive     CLINDAMYCIN <=0.25 SENSITIVE Sensitive     RIFAMPIN <=0.5 SENSITIVE Sensitive     Inducible Clindamycin NEGATIVE Sensitive     * STAPHYLOCOCCUS AUREUS  Blood Culture ID Panel (Reflexed)     Status: Abnormal    Collection Time: 12/25/18 10:04 AM  Result Value Ref Range Status   Enterococcus species NOT DETECTED NOT DETECTED Final   Listeria monocytogenes NOT DETECTED NOT DETECTED Final   Staphylococcus species DETECTED (A) NOT DETECTED Final    Comment: CRITICAL RESULT CALLED TO, READ BACK BY AND VERIFIED WITH: G. ABBOTT,PHARMD 0245 12/26/2018 T. TYSOR    Staphylococcus aureus (BCID) DETECTED (A) NOT DETECTED Final    Comment: Methicillin (oxacillin) susceptible Staphylococcus aureus (MSSA). Preferred therapy is anti staphylococcal beta lactam antibiotic (Cefazolin or Nafcillin), unless clinically contraindicated. CRITICAL RESULT CALLED TO, READ BACK BY AND VERIFIED WITH: G. ABBOTT,PHARMD [id number] 12/26/2018 T. TYSOR    Methicillin resistance NOT DETECTED NOT DETECTED Final   Streptococcus species NOT DETECTED NOT DETECTED Final   Streptococcus agalactiae NOT DETECTED NOT DETECTED Final   Streptococcus pneumoniae NOT DETECTED NOT DETECTED Final   Streptococcus pyogenes NOT DETECTED NOT DETECTED Final   Acinetobacter baumannii NOT DETECTED NOT DETECTED Final   Enterobacteriaceae species NOT DETECTED NOT DETECTED Final   Enterobacter cloacae complex NOT DETECTED NOT DETECTED Final   Escherichia coli NOT DETECTED NOT DETECTED Final   Klebsiella oxytoca NOT DETECTED NOT DETECTED Final   Klebsiella pneumoniae NOT DETECTED NOT DETECTED Final   Proteus species NOT DETECTED NOT DETECTED Final   Serratia marcescens NOT DETECTED NOT DETECTED Final   Haemophilus influenzae NOT DETECTED NOT DETECTED Final   Neisseria meningitidis NOT DETECTED NOT DETECTED Final   Pseudomonas aeruginosa NOT DETECTED NOT DETECTED Final   Candida albicans NOT DETECTED NOT  DETECTED Final   Candida glabrata NOT DETECTED NOT DETECTED Final   Candida krusei NOT DETECTED NOT DETECTED Final   Candida parapsilosis NOT DETECTED NOT DETECTED Final   Candida tropicalis NOT DETECTED NOT DETECTED Final    Comment: Performed at [facility], [place] [address] [place], [place] [address]  Culture, Urine     Status: Abnormal   Collection Time: 12/25/18  5:00 PM  Result Value Ref Range Status   Specimen Description URINE, CLEAN CATCH  Final   Special Requests   Final    NONE Performed at [facility], [place] [address], [place], [place] [address]    Culture >=100,000 COLONIES/mL STAPHYLOCOCCUS EPIDERMIDIS (A)  Final   Report Status 12/27/2018 FINAL  Final   Organism ID, Bacteria STAPHYLOCOCCUS EPIDERMIDIS (A)  Final      Susceptibility   Staphylococcus epidermidis - MIC*    CIPROFLOXACIN >=8 RESISTANT Resistant     GENTAMICIN <=0.5 SENSITIVE Sensitive     NITROFURANTOIN <=16 SENSITIVE Sensitive     OXACILLIN >=4 RESISTANT Resistant     TETRACYCLINE <=1 SENSITIVE Sensitive     VANCOMYCIN 2 SENSITIVE Sensitive     TRIMETH/SULFA <=10 SENSITIVE Sensitive     CLINDAMYCIN <=0.25 SENSITIVE Sensitive     RIFAMPIN <=0.5 SENSITIVE Sensitive     Inducible Clindamycin NEGATIVE Sensitive     * >=100,000 COLONIES/mL STAPHYLOCOCCUS EPIDERMIDIS  Culture, blood (routine x 2)     Status: None   Collection Time: 12/27/18  7:35 AM  Result Value Ref Range Status   Specimen Description BLOOD LEFT HAND  Final   Special Requests   Final    BOTTLES DRAWN AEROBIC ONLY Blood Culture adequate volume   Culture   Final    NO GROWTH 5 DAYS Performed at [facility], [place] [address], [place], [place] [address]    Report Status 01/01/2019 FINAL  Final  Culture, blood (routine x 2)     Status: None   Collection Time: 12/27/18  7:37 AM  Result Value Ref Range Status   Specimen Description BLOOD LEFT WRIST  Final   Special Requests   Final    BOTTLES DRAWN AEROBIC ONLY Blood Culture adequate volume   Culture   Final    NO GROWTH 5 DAYS Performed at [facility], [place] [address] [place], [place] [address]    Report Status 01/01/2019 FINAL  Final  Culture, blood (Routine X 2) w Reflex to ID Panel     Status: None    Collection Time: 12/28/18 10:00 AM  Result Value Ref Range Status   Specimen Description BLOOD LEFT HAND  Final   Special Requests   Final    BOTTLES DRAWN AEROBIC AND ANAEROBIC Blood Culture adequate volume   Culture   Final    NO GROWTH 5 DAYS Performed at [facility], [place] [address], [place], [place] [address]    Report Status 01/02/2019 FINAL  Final  Culture, blood (Routine X 2) w Reflex to ID Panel     Status: None   Collection Time: 12/28/18 10:15 AM  Result Value Ref Range Status   Specimen Description BLOOD LEFT HAND  Final   Special Requests   Final    BOTTLES DRAWN AEROBIC AND ANAEROBIC Blood Culture adequate volume   Culture   Final    NO GROWTH 5 DAYS Performed at [facility], [place] [address], [place], [place] [address]    Report Status  01/02/2019 FINAL  Final         Radiology Studies: Ir Fluoro Guide Cv Line Right  Result Date: 01/02/2019 CLINICAL DATA:  End-stage renal disease with current non tunneled temporary dialysis catheter via the left internal jugular vein. The patient requires placement of a tunneled catheter for long-term use. EXAM: TUNNELED CENTRAL VENOUS HEMODIALYSIS CATHETER PLACEMENT WITH ULTRASOUND AND FLUOROSCOPIC GUIDANCE ANESTHESIA/SEDATION: 1.0 mg IV Versed; 50 mcg IV Fentanyl. Total Moderate Sedation Time:   23 minutes. The patient's level of consciousness and physiologic status were continuously monitored during the procedure by Radiology nursing. MEDICATIONS: 2 g IV Ancef. FLUOROSCOPY TIME:  42 seconds. 6.9 mGy. PROCEDURE: The procedure, risks, benefits, and alternatives were explained to the patient. Questions regarding the procedure were encouraged and answered. The patient understands and consents to the procedure. A timeout was performed prior to initiating the procedure. The right neck and chest were prepped with chlorhexidine in a sterile fashion, and a sterile drape was applied covering the operative field. Maximum barrier sterile  technique with sterile gowns and gloves were used for the procedure. Local anesthesia was provided with 1% lidocaine. Ultrasound was performed to confirm patency of the right internal jugular vein. Image documentation was performed. After creating a small venotomy incision, a 21 gauge needle was advanced into the right internal jugular vein under direct, real-time ultrasound guidance. Ultrasound image documentation was performed. After securing guidewire access, an 8 Fr dilator was placed. A J-wire was kinked to measure appropriate catheter length. A Palindrome tunneled hemodialysis catheter measuring 23 cm from tip to cuff was chosen for placement. This was tunneled in a retrograde fashion from the chest wall to the venotomy incision. At the venotomy, serial dilatation was performed and a 16 Fr peel-away sheath was placed over a guidewire. The catheter was then placed through the sheath and the sheath removed. Final catheter positioning was confirmed and documented with a fluoroscopic spot image. The catheter was aspirated, flushed with saline, and injected with appropriate volume heparin dwells. The venotomy incision was closed with subcutaneous 3-0 Monocryl and subcuticular 4-0 Vicryl. Dermabond was applied to the incision. The catheter exit site was secured with 0-Prolene retention sutures. After placement of the tunneled catheter, the left-sided non tunneled catheter was removed. COMPLICATIONS: None.  No pneumothorax. FINDINGS: The right internal jugular vein demonstrates nonocclusive thrombus occupying approximately 50% of the lumen of the vein. The vein was patent enough to allow placement of a dialysis catheter. After catheter placement, the tip lies in the right atrium. The catheter aspirates normally and is ready for immediate use. IMPRESSION: Placement of tunneled hemodialysis catheter via the right internal jugular vein. The catheter tip lies in the right atrium. The catheter is ready for immediate use.  Electronically Signed   By: Aletta Edouard M.D.   On: 01/02/2019 15:12   Ir US Guide Vasc Access Right  Result Date: 01/02/2019 CLINICAL DATA:  End-stage renal disease with current non tunneled temporary dialysis catheter via the left internal jugular vein. The patient requires placement of a tunneled catheter for long-term use. EXAM: TUNNELED CENTRAL VENOUS HEMODIALYSIS CATHETER PLACEMENT WITH ULTRASOUND AND FLUOROSCOPIC GUIDANCE ANESTHESIA/SEDATION: 1.0 mg IV Versed; 50 mcg IV Fentanyl. Total Moderate Sedation Time:   23 minutes. The patient's level of consciousness and physiologic status were continuously monitored during the procedure by Radiology nursing. MEDICATIONS: 2 g IV Ancef. FLUOROSCOPY TIME:  42 seconds. 6.9 mGy. PROCEDURE: The procedure, risks, benefits, and alternatives were explained to the patient. Questions regarding the procedure were  encouraged and answered. The patient understands and consents to the procedure. A timeout was performed prior to initiating the procedure. The right neck and chest were prepped with chlorhexidine in a sterile fashion, and a sterile drape was applied covering the operative field. Maximum barrier sterile technique with sterile gowns and gloves were used for the procedure. Local anesthesia was provided with 1% lidocaine. Ultrasound was performed to confirm patency of the right internal jugular vein. Image documentation was performed. After creating a small venotomy incision, a 21 gauge needle was advanced into the right internal jugular vein under direct, real-time ultrasound guidance. Ultrasound image documentation was performed. After securing guidewire access, an 8 Fr dilator was placed. A J-wire was kinked to measure appropriate catheter length. A Palindrome tunneled hemodialysis catheter measuring 23 cm from tip to cuff was chosen for placement. This was tunneled in a retrograde fashion from the chest wall to the venotomy incision. At the venotomy, serial  dilatation was performed and a 16 Fr peel-away sheath was placed over a guidewire. The catheter was then placed through the sheath and the sheath removed. Final catheter positioning was confirmed and documented with a fluoroscopic spot image. The catheter was aspirated, flushed with saline, and injected with appropriate volume heparin dwells. The venotomy incision was closed with subcutaneous 3-0 Monocryl and subcuticular 4-0 Vicryl. Dermabond was applied to the incision. The catheter exit site was secured with 0-Prolene retention sutures. After placement of the tunneled catheter, the left-sided non tunneled catheter was removed. COMPLICATIONS: None.  No pneumothorax. FINDINGS: The right internal jugular vein demonstrates nonocclusive thrombus occupying approximately 50% of the lumen of the vein. The vein was patent enough to allow placement of a dialysis catheter. After catheter placement, the tip lies in the right atrium. The catheter aspirates normally and is ready for immediate use. IMPRESSION: Placement of tunneled hemodialysis catheter via the right internal jugular vein. The catheter tip lies in the right atrium. The catheter is ready for immediate use. Electronically Signed   By: Aletta Edouard M.D.   On: 01/02/2019 15:12        Scheduled Meds:  amLODipine  10 mg Oral Daily   Chlorhexidine Gluconate Cloth  6 each Topical Q0600   feeding supplement (ENSURE ENLIVE)  237 mL Oral BID BM   FLUoxetine  40 mg Oral Daily   folic acid  1 mg Oral Daily   heparin       loxapine  25 mg Oral Daily   loxapine  50 mg Oral QHS   metoprolol tartrate  25 mg Oral BID   polyethylene glycol  17 g Oral Daily   risperiDONE  9 mg Oral QHS   senna-docusate  1 tablet Oral BID   thiamine  100 mg Oral Daily   topiramate  250 mg Oral QHS   traZODone  300 mg Oral QHS   Continuous Infusions:  sodium chloride     sodium chloride     sodium chloride Stopped (12/26/18 0534)    ceFAZolin (ANCEF)  IV     heparin 2,000 Units/hr (01/03/19 0545)     LOS: 15 days    Time spent: 35 minutes    Irine Seal, MD [facility]  If 7PM-7AM, please contact night-coverage [email or web address] 01/03/2019, 10:56 AM

## 2019-01-03 NOTE — Progress Notes (Signed)
Physical Therapy Treatment Patient Details Name: James [name] MRN: [id number] DOB: 07-21-1976 Today's Date: 01/03/2019    History of Present Illness Pt is a 43 y/o male admitted after fall. Found to have acute renal failure secondary to rhabdomyolysis. Pt is s/p tunneled HD catheter and started HD on 4/7. PMH includes DM, bipolar disorder, HTN, and alcohol abuse.     PT Comments    Fatigued today 2 HD earlier, however overall good rehab effort. Pt participated in stair training per pt request, and was able to complete 10 step-up's (open chain) with min guard assist and good clearance of step each time. Will continue to follow.     Follow Up Recommendations  Home health PT;Supervision for mobility/OOB     Equipment Recommendations  None recommended by PT    Recommendations for Other Services       Precautions / Restrictions Precautions Precautions: Fall Restrictions Weight Bearing Restrictions: No    Mobility  Bed Mobility Overal bed mobility: Modified Independent Bed Mobility: Supine to Sit Rolling: Mod assist   Supine to sit: Modified independent (Device/Increase time) Sit to supine: Supervision   General bed mobility comments: Increased time and moderate reliance of rails to transition to EOB.   Transfers Overall transfer level: Needs assistance Equipment used: Rolling walker (2 wheeled) Transfers: Sit to/from Stand Sit to Stand: Supervision         General transfer comment: Less effortful but pt was able to transition to standing without assistance. Required increased time once standing to "stretch his legs out".   Ambulation/Gait Ambulation/Gait assistance: Supervision Gait Distance (Feet): 500 Feet Assistive device: None Gait Pattern/deviations: Step-through pattern;Decreased stride length;Wide base of support Gait velocity: Decreased  Gait velocity interpretation: 1.31 - 2.62 ft/sec, indicative of limited community ambulator General Gait  Details: Pt ambulated to the stairwell and around the unit. 1 instance of unsteadiness but able to recover without difficulty.    Stairs Stairs: Yes Stairs assistance: Min guard Stair Management: Two rails;Step to pattern;Forwards Number of Stairs: 10 General stair comments: Completed as an exercise - 1 step up and down x10; open chain.    Wheelchair Mobility    Modified Rankin (Stroke Patients Only)       Balance Overall balance assessment: Needs assistance Sitting-balance support: No upper extremity supported;Feet supported Sitting balance-Leahy Scale: Good     Standing balance support: Bilateral upper extremity supported;During functional activity Standing balance-Leahy Scale: Fair                              Cognition Arousal/Alertness: Awake/alert Behavior During Therapy: Flat affect Overall Cognitive Status: Within Functional Limits for tasks assessed                                 General Comments: increased time for processing      Exercises      General Comments        Pertinent Vitals/Pain Pain Assessment: No/denies pain    Home Living                      Prior Function            PT Goals (current goals can now be found in the care plan section) Acute Rehab PT Goals Patient Stated Goal: To get better at the stairs PT Goal Formulation: With patient Time For Goal Achievement:  01/06/19 Potential to Achieve Goals: Good Progress towards PT goals: Progressing toward goals    Frequency    Min 3X/week      PT Plan Current plan remains appropriate    Co-evaluation              AM-PAC PT "6 Clicks" Mobility   Outcome Measure  Help needed turning from your back to your side while in a flat bed without using bedrails?: None Help needed moving from lying on your back to sitting on the side of a flat bed without using bedrails?: None Help needed moving to and from a bed to a chair (including a  wheelchair)?: A Little Help needed standing up from a chair using your arms (e.g., wheelchair or bedside chair)?: A Little Help needed to walk in hospital room?: A Little Help needed climbing 3-5 steps with a railing? : A Little 6 Click Score: 20    End of Session Equipment Utilized During Treatment: Gait belt Activity Tolerance: Patient tolerated treatment well Patient left: in bed;with bed alarm set;with call bell/phone within reach Nurse Communication: Mobility status PT Visit Diagnosis: Other abnormalities of gait and mobility (R26.89);Unsteadiness on feet (R26.81);Muscle weakness (generalized) (M62.81)     Time: 2446-9507 PT Time Calculation (min) (ACUTE ONLY): 20 min  Charges:  $Gait Training: 8-22 mins                     Rolinda Roan, PT, DPT Acute Rehabilitation Services Pager: [phone] Office: [phone]    Thelma Comp 01/03/2019, 3:08 PM

## 2019-01-03 NOTE — Progress Notes (Signed)
ANTICOAGULATION CONSULT NOTE   Pharmacy Consult for Heparin Indication: DVT  No Known Allergies  Patient Measurements: Height: 6\' 3"  (190.5 cm) Weight: 253 lb 1.4 oz (114.8 kg) IBW/kg (Calculated) : 84.5 Heparin Dosing Weight: 111kg  Vital Signs: Temp: 98 F (36.7 C) (04/21 1507) Temp Source: Oral (04/21 1507) BP: 141/80 (04/21 1507) Pulse Rate: 55 (04/21 1507)  Labs: Recent Labs    01/01/19 0421 01/02/19 0214 01/03/19 0238 01/03/19 1945  HGB 8.3* 8.3* 8.4*  --   HCT 24.9* 24.2* 26.0*  --   PLT 362 339 343  --   HEPARINUNFRC  --  0.22* 0.17* 0.29*  CREATININE 4.25* 5.81* 7.14*  --     Estimated Creatinine Clearance: 17.9 mL/min (A) (by C-G formula based on SCr of 7.14 mg/dL (H)).    Assessment: 63 yoM admitted with rhabdomyolysis now with R IJ thrombosis. Pharmacy asked to dose  IV heparin. Heparin levels have remained low but now 0.29 just below goal.   Goal of Therapy:  Heparin level 0.3-0.7 units/ml Monitor platelets by anticoagulation protocol: Yes   Plan:  Increase heparin drip to 2100 units/hr Recheck heparin level with daily labs  Arrie Senate, PharmD, BCPS Clinical Pharmacist [phone] Please check AMION for all [place] numbers 01/03/2019

## 2019-01-04 LAB — RENAL FUNCTION PANEL
Albumin: 2.3 g/dL — ABNORMAL LOW (ref 3.5–5.0)
Anion gap: 11 (ref 5–15)
BUN: 30 mg/dL — ABNORMAL HIGH (ref 6–20)
CO2: 25 mmol/L (ref 22–32)
Calcium: 8.5 mg/dL — ABNORMAL LOW (ref 8.9–10.3)
Chloride: 96 mmol/L — ABNORMAL LOW (ref 98–111)
Creatinine, Ser: 5.78 mg/dL — ABNORMAL HIGH (ref 0.61–1.24)
GFR calc Af Amer: 13 mL/min — ABNORMAL LOW (ref >60)
GFR calc non Af Amer: 11 mL/min — ABNORMAL LOW (ref >60)
Glucose, Bld: 78 mg/dL (ref 70–99)
Phosphorus: 4.8 mg/dL — ABNORMAL HIGH (ref 2.5–4.6)
Potassium: 4.5 mmol/L (ref 3.5–5.1)
Sodium: 132 mmol/L — ABNORMAL LOW (ref 135–145)

## 2019-01-04 LAB — GLUCOSE, CAPILLARY: Glucose-Capillary: 107 mg/dL — ABNORMAL HIGH (ref 70–99)

## 2019-01-04 LAB — CBC
HCT: 23.8 % — ABNORMAL LOW (ref 39.0–52.0)
Hemoglobin: 8 g/dL — ABNORMAL LOW (ref 13.0–17.0)
MCH: 31 pg (ref 26.0–34.0)
MCHC: 33.6 g/dL (ref 30.0–36.0)
MCV: 92.2 fL (ref 80.0–100.0)
Platelets: 324 10*3/uL (ref 150–400)
RBC: 2.58 MIL/uL — ABNORMAL LOW (ref 4.22–5.81)
RDW: 12.2 % (ref 11.5–15.5)
WBC: 6.7 10*3/uL (ref 4.0–10.5)
nRBC: 0 % (ref 0.0–0.2)

## 2019-01-04 LAB — PROTIME-INR
INR: 1.1 (ref 0.8–1.2)
Prothrombin Time: 13.9 seconds (ref 11.4–15.2)

## 2019-01-04 LAB — HEPARIN LEVEL (UNFRACTIONATED): Heparin Unfractionated: 0.29 IU/mL — ABNORMAL LOW (ref 0.30–0.70)

## 2019-01-04 MED ORDER — WARFARIN SODIUM 5 MG PO TABS
10.0000 mg | ORAL_TABLET | Freq: Once | ORAL | Status: AC
  Administered 2019-01-04: 18:00:00 10 mg via ORAL
  Filled 2019-01-04: qty 2

## 2019-01-04 MED ORDER — CHLORHEXIDINE GLUCONATE CLOTH 2 % EX PADS
6.0000 | MEDICATED_PAD | Freq: Every day | CUTANEOUS | Status: DC
  Administered 2019-01-07 – 2019-01-15 (×8): 6 via TOPICAL

## 2019-01-04 MED ORDER — COUMADIN BOOK
Freq: Once | Status: AC
  Administered 2019-01-04: 18:00:00

## 2019-01-04 MED ORDER — WARFARIN - PHARMACIST DOSING INPATIENT
Freq: Every day | Status: DC
  Administered 2019-01-04 – 2019-01-06 (×3)

## 2019-01-04 MED ORDER — DARBEPOETIN ALFA 40 MCG/0.4ML IJ SOSY
40.0000 ug | PREFILLED_SYRINGE | INTRAMUSCULAR | Status: DC
  Administered 2019-01-05: 40 ug via INTRAVENOUS
  Filled 2019-01-04: qty 0.4

## 2019-01-04 NOTE — Progress Notes (Addendum)
ANTICOAGULATION CONSULT NOTE - Follow Up Consult  Pharmacy Consult for heparin Indication: DVT  Labs: Recent Labs    01/02/19 0214 01/03/19 0238 01/03/19 1945 01/04/19 0313  HGB 8.3* 8.4*  --  8.0*  HCT 24.2* 26.0*  --  23.8*  PLT 339 343  --  324  HEPARINUNFRC 0.22* 0.17* 0.29* 0.29*  CREATININE 5.81* 7.14*  --  5.78*    Assessment: 43yo male remains subtherapeutic on heparin with no change in heparin level despite rate increase, RN confirms rate was changed appropriately; no gtt issues or signs of bleeding per RN.  Goal of Therapy:  Heparin level 0.3-0.7 units/ml   Plan:  Will increase heparin gtt by 10% to 2300 units/hr  Follow up AM labs  Begin po anti-coagulation - warfarin?  Thank you Anette Guarneri, PharmD [phone]

## 2019-01-04 NOTE — Progress Notes (Signed)
Dialysis Coordinator received call from Dr. Bartholomew Boards to initiate OP HD seat referral process. Dialysis Coordinator spoke with patient and it has been determined that [facility] is closest clinic to his home. He reports that he will be staying with his mother for a period of time when he gets out of the hospital, at [address] in [place] and that she will be taking him to HD. Dialysis Coordinator submitted information to [facility] Admissions Coordinator. Dialysis Coordinator will update MD and patient once OP HD seat is secured and schedule has been obtained.   James [name] Dialysis Coordinator [phone]

## 2019-01-04 NOTE — Progress Notes (Signed)
Occupational Therapy Treatment Patient Details Name: James [name] MRN: [id number] DOB: 1975/11/20 Today's Date: 01/04/2019    History of present illness Pt is a 43 y/o male admitted after fall. Found to have acute renal failure secondary to rhabdomyolysis. Pt is s/p tunneled HD catheter and started HD on 4/7. PMH includes DM, bipolar disorder, HTN, and alcohol abuse.    OT comments  Pt performing bed mobility to EOB with modified independence; sitting EOB with good dynamic sitting balance and ability to perform BUE HEP provided with handouts for visual learning. Pt performing light grooming at EOB , but decline further mobility. Pt nearly at his baseline functioning. OTR to continue to address LB dressing with AE as needed. BLEs swelling has decreased RLE continues to be more swollen than LLE. Pt would greatly benefit from continued OT skilled services for ADL, mobility and safety in [facility] setting. OT to follow acutely for LB and HEP needs.   Follow Up Recommendations  Home health OT;Supervision - Intermittent    Equipment Recommendations  None recommended by OT    Recommendations for Other Services      Precautions / Restrictions Precautions Precautions: Fall Restrictions Weight Bearing Restrictions: No       Mobility Bed Mobility Overal bed mobility: Modified Independent Bed Mobility: Supine to Sit           General bed mobility comments: Increased time and moderate reliance of rails to transition to EOB.   Transfers Overall transfer level: Needs assistance Equipment used: Rolling walker (2 wheeled) Transfers: Sit to/from Stand Sit to Stand: Supervision         General transfer comment: No unsteadiness or LOB noted. `    Balance Overall balance assessment: Needs assistance Sitting-balance support: No upper extremity supported;Feet supported Sitting balance-Leahy Scale: Good     Standing balance support: Bilateral upper extremity supported;During  functional activity Standing balance-Leahy Scale: Fair Standing balance comment: walker and supervision for static standing                           ADL either performed or assessed with clinical judgement   ADL Overall ADL's : Needs assistance/impaired                                     Functional mobility during ADLs: Min guard General ADL Comments: pt limited secondary to reports  decreased swelling in BLEs increasing ability to perform ADL.     Vision       Perception     Praxis      Cognition Arousal/Alertness: Awake/alert Behavior During Therapy: Flat affect Overall Cognitive Status: Within Functional Limits for tasks assessed                                 General Comments: increased time for processing        Exercises     Shoulder Instructions       General Comments Pt sat EOB for dynamic sitting balance tasks and BUE HEP    Pertinent Vitals/ Pain       Pain Assessment: No/denies pain  Home Living  Prior Functioning/Environment              Frequency  Min 2X/week        Progress Toward Goals  OT Goals(current goals can now be found in the care plan section)  Progress towards OT goals: Progressing toward goals  Acute Rehab OT Goals Patient Stated Goal: To get better at the stairs OT Goal Formulation: With patient Time For Goal Achievement: 01/07/19 Potential to Achieve Goals: Good ADL Goals Pt Will Perform Grooming: Independently Pt Will Perform Upper Body Dressing: Independently Pt Will Perform Lower Body Dressing: Independently Pt Will Transfer to Toilet: Independently;ambulating  Plan Discharge plan remains appropriate    Co-evaluation                 AM-PAC OT "6 Clicks" Daily Activity     Outcome Measure   Help from another person eating meals?: None Help from another person taking care of personal grooming?:  None Help from another person toileting, which includes using toliet, bedpan, or urinal?: A Little Help from another person bathing (including washing, rinsing, drying)?: A Little Help from another person to put on and taking off regular upper body clothing?: A Little Help from another person to put on and taking off regular lower body clothing?: A Lot 6 Click Score: 19    End of Session    OT Visit Diagnosis: Unsteadiness on feet (R26.81);Other abnormalities of gait and mobility (R26.89)   Activity Tolerance Patient limited by lethargy   Patient Left in bed;with call bell/phone within reach   Nurse Communication Mobility status        Time: 4360-6770 OT Time Calculation (min): 10 min  Charges: OT General Charges $OT Visit: 1 Visit OT Treatments $Therapeutic Exercise: 8-22 mins  Darryl Nestle) Marsa Aris OTR/L Acute Rehabilitation Services Pager: [phone] Office: [phone]    Audie Pinto 01/04/2019, 2:46 PM

## 2019-01-04 NOTE — Progress Notes (Signed)
Physical Therapy Treatment Patient Details Name: James [name] MRN: [id number] DOB: 1975-12-18 Today's Date: 01/04/2019    History of Present Illness Pt is a 43 y/o male admitted after fall. Found to have acute renal failure secondary to rhabdomyolysis. Pt is s/p tunneled HD catheter and started HD on 4/7. PMH includes DM, bipolar disorder, HTN, and alcohol abuse.     PT Comments    Pt progressing well towards physical therapy goals. With higher level balance activity during gait training, pt demonstrated unsteadiness with head turns and stepping over objects. Was able to adjust speed well and complete sudden stops and turns with min guard assist. Pt was educated on benefits of sitting up OOB in chair, and ambulating in halls outside of PT sessions. Encouraged pt to schedule time with the nursing staff at least once per day. Will continue to follow and progress as able per POC.    Follow Up Recommendations  Home health PT;Supervision for mobility/OOB     Equipment Recommendations  None recommended by PT    Recommendations for Other Services       Precautions / Restrictions Precautions Precautions: Fall Restrictions Weight Bearing Restrictions: No    Mobility  Bed Mobility Overal bed mobility: Modified Independent Bed Mobility: Supine to Sit           General bed mobility comments: Increased time and moderate reliance of rails to transition to EOB.   Transfers Overall transfer level: Needs assistance Equipment used: Rolling walker (2 wheeled) Transfers: Sit to/from Stand Sit to Stand: Supervision         General transfer comment: No unsteadiness or LOB noted. `  Ambulation/Gait Ambulation/Gait assistance: Supervision Gait Distance (Feet): 500 Feet Assistive device: None Gait Pattern/deviations: Step-through pattern;Decreased stride length;Wide base of support Gait velocity: Decreased  Gait velocity interpretation: 1.31 - 2.62 ft/sec, indicative of  limited community ambulator General Gait Details: 2 laps around unit - managed well without reports of fatigue. Slightly unsteady with horizontal head turns however no assist required to recover.    Stairs             Wheelchair Mobility    Modified Rankin (Stroke Patients Only)       Balance Overall balance assessment: Needs assistance Sitting-balance support: No upper extremity supported;Feet supported Sitting balance-Leahy Scale: Good     Standing balance support: Bilateral upper extremity supported;During functional activity Standing balance-Leahy Scale: Fair Standing balance comment: walker and supervision for static standing                            Cognition Arousal/Alertness: Awake/alert Behavior During Therapy: Flat affect Overall Cognitive Status: Within Functional Limits for tasks assessed                                 General Comments: increased time for processing      Exercises      General Comments        Pertinent Vitals/Pain Pain Assessment: No/denies pain    Home Living                      Prior Function            PT Goals (current goals can now be found in the care plan section) Acute Rehab PT Goals Patient Stated Goal: To get better at the stairs PT Goal Formulation: With patient  Time For Goal Achievement: 01/06/19 Potential to Achieve Goals: Good Progress towards PT goals: Progressing toward goals    Frequency    Min 3X/week      PT Plan Current plan remains appropriate    Co-evaluation              AM-PAC PT "6 Clicks" Mobility   Outcome Measure  Help needed turning from your back to your side while in a flat bed without using bedrails?: None Help needed moving from lying on your back to sitting on the side of a flat bed without using bedrails?: None Help needed moving to and from a bed to a chair (including a wheelchair)?: A Little Help needed standing up from a chair  using your arms (e.g., wheelchair or bedside chair)?: A Little Help needed to walk in hospital room?: A Little Help needed climbing 3-5 steps with a railing? : A Little 6 Click Score: 20    End of Session Equipment Utilized During Treatment: Gait belt Activity Tolerance: Patient tolerated treatment well Patient left: in chair;with call bell/phone within reach Nurse Communication: Mobility status PT Visit Diagnosis: Other abnormalities of gait and mobility (R26.89);Unsteadiness on feet (R26.81);Muscle weakness (generalized) (M62.81)     Time: 1330-1403 PT Time Calculation (min) (ACUTE ONLY): 33 min  Charges:  $Gait Training: 23-37 mins                     James [name], PT, DPT Acute Rehabilitation Services Pager: [phone] Office: [phone]    James [name] 01/04/2019, 2:23 PM

## 2019-01-04 NOTE — TOC Progression Note (Signed)
Transition of Care [facility]) - Progression Note    Patient Details  Name: James [name] MRN: [id number] Date of Birth: Feb 27, 1976  [facility] CM/SW Contact  Carles Collet, RN Phone Number: 01/04/2019, 10:19 AM  Clinical Narrative:   Patient remains hospitalized.  AKI- TDC placed 4/20, HD per nephrology recs, assessing daily, no specific plan for long term HD identified yet but not ruled out. MSSA Bacteremia- IV Abx adjusted w HD at this time. Will continue to follow for potential for home IV Abx. If on long term HD they could potentially be given there. R IJ Thrombus- currently on hep drip, will continue to follow for anticoag choice.   Previously referred to [facility] for [place] at DC. CM will need to notify them of DC.     Expected Discharge Plan: [place] Barriers to Discharge: Continued Medical Work up  Expected Discharge Plan and Services Expected Discharge Plan: [place] In-house Referral: Clinical Social Work Discharge Planning Services: CM Consult Post Acute Care Choice: Home Health, Durable Medical Equipment Living arrangements for the past 2 months: Apartment                 DME Arranged: Walker rolling   HH Arranged: PT, OT HH Agency: [place] ([facility]   Social Determinants of Health (SDOH) Interventions    Readmission Risk Interventions No flowsheet data found.

## 2019-01-04 NOTE — Progress Notes (Signed)
Contacted Nephrology via Amion to address removal of foley catheter and place condom cath for output monitoring. Awaiting answer.

## 2019-01-04 NOTE — Progress Notes (Signed)
ANTICOAGULATION CONSULT NOTE - Follow Up Consult  Pharmacy Consult for Heparin / Warfarin Indication: DVT  Labs: Recent Labs    01/02/19 0214 01/03/19 0238 01/03/19 1945 01/04/19 0313  HGB 8.3* 8.4*  --  8.0*  HCT 24.2* 26.0*  --  23.8*  PLT 339 343  --  324  HEPARINUNFRC 0.22* 0.17* 0.29* 0.29*  CREATININE 5.81* 7.14*  --  5.78*    Assessment: 43yo male remains subtherapeutic on heparin with no change in heparin level despite rate increase, RN confirms rate was changed appropriately; no gtt issues or signs of bleeding per RN.  Goal of Therapy:  Heparin level 0.3-0.7 units/ml   Plan:  Will increase heparin gtt by 10% to 2300 units/hr  Follow up AM labs  Warfarin 10 mg po x 1  Thank you Anette Guarneri, PharmD [phone]

## 2019-01-04 NOTE — Progress Notes (Signed)
Patient ID: James [name], male   DOB: [birth year], 43 y.o.   MRN: [id number] [facility] Progress Note   Assessment/ Plan:   1. Acute kidney Injury:  - 2/2 ATN. Hx rhabdo and polysubstance abuse.  nonoliguric - HD is per TTS schedule for now  - tunneled catheter placed 4/20 (IR) - Recovery may be slow and protracted based on severity of injury.   - continues to be dialysis dependent  - Can remove foley catheter for voiding trial - Started CLIP process for outpatient dialysis unit placement.   2.  Volume overload: optimize with HD 3.  Bipolar disorder/anxiety: management per primary service. 4.  History of alcohol abuse: initial ultrasound suggestive of early development of cirrhosis (suspect fatty liver has a contributory effect). 5.  Hyponatremia: Secondary to acute kidney injury and unrestricted oral fluid intake.  Fluid restrict and HD as above  6. MSSA bacteremia: On intravenous cefazolin adjusted with dialysis.  Status post catheter holiday as above. Last blood cultures NGTD  7.  Right IJ thrombus: on heparin gtt; per primary team  8. Anemia - secondary in part to AKI.  Start aranesp   Subjective:   Patient with 600 mL urine output over 4/21.  He lives in [place] and will be going to his mother's house once he is discharged.    Review of systems:  Denies shortness of breath, chest pain Denies nausea or vomiting     Objective:   BP (!) 143/90 (BP Location: Right Arm)   Pulse (!) 56   Temp 97.9 F (36.6 C) (Oral)   Resp 18   Ht 6\' 3"  (1.905 m)   Wt 115.2 kg   SpO2 98%   BMI 31.75 kg/m   Intake/Output Summary (Last 24 hours) at 01/04/2019 1330 Last data filed at 01/03/2019 1751 Gross per 24 hour  Intake 393.71 ml  Output 150 ml  Net 243.71 ml   Weight change: -5.7 kg  Physical Exam: Gen: adult male in bed in NAD at rest CVS: RRR; no rub Resp: clear and unlabored Abd: Soft, obese, nontender Ext: 1+ edema lower extremities  bilaterally  Imaging: No results found.  Labs: BMET Recent Labs  Lab 12/29/18 0446 12/30/18 0237 12/31/18 0229 01/01/19 0421 01/02/19 0214 01/03/19 0238 01/04/19 0313  NA 131* 128* 130* 133* 130* 131* 132*  K 4.4 4.7 4.0 4.2 4.5 5.2* 4.5  CL 94* 92* 92* 95* 93* 94* 96*  CO2 25 24 24 29 28 25 25   GLUCOSE 88 82 80 84 85 83 78  BUN 48* 58* 31* 19 29* 44* 30*  CREATININE 6.82* 8.03* 5.11* 4.25* 5.81* 7.14* 5.78*  CALCIUM 8.2* 8.2* 7.9* 8.2* 8.2* 8.5* 8.5*  PHOS  --  5.5* 3.8 4.2 4.9* 6.1* 4.8*   CBC Recent Labs  Lab 12/30/18 0237 12/31/18 0229 01/01/19 0421 01/02/19 0214 01/03/19 0238 01/04/19 0313  WBC 7.1 7.8 6.9 7.7 7.4 6.7  NEUTROABS 4.6 5.3 4.4  --   --   --   HGB 8.3* 8.4* 8.3* 8.3* 8.4* 8.0*  HCT 25.4* 25.3* 24.9* 24.2* 26.0* 23.8*  MCV 91.4 90.0 94.0 92.0 92.5 92.2  PLT 326 345 362 339 343 324    Medications:    . amLODipine  10 mg Oral Daily  . Chlorhexidine Gluconate Cloth  6 each Topical Q0600  . feeding supplement (ENSURE ENLIVE)  237 mL Oral BID BM  . FLUoxetine  40 mg Oral Daily  . folic acid  1 mg Oral  Daily  . loxapine  25 mg Oral Daily  . loxapine  50 mg Oral QHS  . metoprolol tartrate  25 mg Oral BID  . polyethylene glycol  17 g Oral Daily  . risperiDONE  9 mg Oral QHS  . senna-docusate  1 tablet Oral BID  . thiamine  100 mg Oral Daily  . topiramate  250 mg Oral QHS  . traZODone  300 mg Oral QHS   Claudia Desanctis 01/04/2019

## 2019-01-04 NOTE — Progress Notes (Signed)
PROGRESS NOTE    James [name]  CWC:[id number] DOB: 1975-12-30 DOA: 12/19/2018 PCP: No primary care provider on file.    Brief Narrative:  43 year old male with history of hypertension, diabetes mellitus type 2, bipolar disorder presented on 12/17/2018 to [facility] after being found down for 2 days following binge alcohol drinking.  He was found to have AKI, metabolic acidosis, leukocytosis and LFT elevation with rhabdomyolysis, CK of 58,000.  He was treated with aggressive IV fluids but unfortunately his renal function got worse with decreased urine output and CK rose to 130,000.  No improvement with IV diuresis.  He was transferred to [facility] on 12/19/2018.  Nephrology was consulted.  Tunneled HD catheter was inserted and hemodialysis was started on 12/20/2018.  HD catheter subsequently removed 12/27/2018.  Patient also noted to have a MSSA bacteremia.  ID consulted.   Assessment & Plan:  Principal Problem:   MSSA bacteremia Active Problems:   Diabetes mellitus ([place])   Bipolar affective disorder ([place])   ARF (acute renal failure) ([place])   Alcoholism ([place])   AKI (acute kidney injury) ([place])   Hemodialysis catheter infection ([place])   Traumatic rhabdomyolysis ([place])   Thrombosis of right internal jugular vein ([place])  1. MSSA bacteremia -Questionable etiology.  Concern for possible line infection.  2D echo which was done was negative for infective endocarditis and only mild mitral valvular regurgitation and trivial tricuspid valvular regurgitation. -TEE was unable to be done at this time; HD catheter line was removed on 12/27/2018.  Repeat blood cultures have been ordered per ID which has remained no growth and final results pending.   -ID recommending treating with IV cefazolin x14 days from line removal with day 1 either 12/28/2018 with treatment through 01/10/2019.  Appreciate ID  input and recommendations.  2.  Acute renal failure -Felt likely secondary to rhabdomyolysis  plus or minus ATN.  -Patient has required HD treatment and unfortunately not full recovery achieved. -currently with plans to pursuit HD as an outpatient -continue to follow nephrology recommendations -now waiting on clipping process.  3.  Rhabdomyolysis -stable now -last CK 984 -CK total peaked at 130,000  -now requiring HD  -patient encourage to maintain adequate hydration  4.  Volume overload -Improved with hemodialysis initially; HD catheter was removed and Patient still with volume overload noted on examination with scrotal swelling and some lower extremity edema.  Placement of non-tunneled dialysis catheter was done 12/30/2018.   -Patient started back up on hemodialysis with improvement with volume status.   -4 L removed during last hemodialysis session on 12/31/2018.  Patient in non-oliguric HD dependent renal state currently. -patient will need hemodialysis as an outpatient, starting clipping process  Per nephrology.   5.  Alcohol abuse -Patient with no signs of alcohol withdrawal.   -has completed librium tapering -continue folic acid and thiamine -cessation counseling encouraged.  6.  Transaminitis -Likely secondary to alcohol use and rhabdomyolysis.   -Acute hepatitis panel negative.   -HIV nonreactive.  -Right upper quadrant ultrasound with early cirrhotic changes noted.   -LFTs trended down appropriately -patient encouraged to maintain alcohol abstinence.   7.  Hyponatremia -Secondary to hypervolemic hyponatremia secondary to acute kidney injury.  Initially improved with hemodialysis however trending down yesterday and trending back up after patient placed back on hemodialysis.   -Status post non-tunneled dialysis catheter 12/30/2018 per interventional radiology.   -Patient will need hemodialysis at discharge -Per nephrology, clipping process initiated.  8.  Diabetes mellitus type 2 -Hemoglobin  A1c of 5.2.   -Oral diabetic medications on hold while inpatient -D/C'd  sliding scale insulin and follow VS with blood work, if CBG's > 200 will re-initiate SSI.   9.  Elevated troponin -Secondary to rhabdomyolysis.  No further interventions at this time. -patient denies CP and SOB.  10.  Maculopapular rash -Improved.  11.  Class 1 Obesity -Body mass index is 31.75 kg/m. -low calorie diet, portion control and physical activity discussed with patient  12.  Bipolar disorder/anxiety -mood Stable.   -Continue Prozac, Risperdal, Topamax, trazodone. -no SI or hallucinations.  13.  Hypertension -Well-controlled on current regimen of Lopressor and Norvasc.  -follow VS>  14.  Right IJ thrombus -Right upper extremity Dopplers done and consistent with age indeterminant deep vein thrombosis involving the right internal jugular veins.  -Patient started on heparin drip and pharmacy consulted for initiation on coumadin.   -Case was discussed with nephrology and they are in agreement.    DVT prophylaxis: Heparin Code Status: Full Family Communication: Updated patient.  No family at bedside. Disposition Plan: Likely home with home health when clinically stable, in need for outpatient HD (waiting clipping process), completing IV antibiotics for MSSA bacteremia. Also on heparin drip and starting transition to coumadin for DVT management.  Consultants:   Infectious disease: Dr. Rhina Brackett [place] 12/26/2018  Nephrology: Dr. Hollie Salk 12/19/2018  Procedures:   Chest x-ray 12/25/2018  Renal ultrasound 12/20/2018  Right IJ catheter placement under ultrasound and fluoroscopic guidance per Dr.Hassell interventional radiology for 12/20/2018  2D echo 12/26/2018  Placement of non-tunneled dialysis catheter left IJ per Dr. Anselm Pancoast 12/30/2018  Placement of tunneled HD catheter per Dr. Kathlene Cote 01/02/2019  Antimicrobials:   IV Ancef 12/26/2018>>>>> 01/10/2019   Subjective: Afebrile, no CP, no SOB, no nausea, no vomiting. No active withdrawal and currently oriented X 3. Patient  has continue requiring HD.  Objective: Vitals:   01/03/19 2147 01/04/19 0442 01/04/19 0621 01/04/19 1051  BP: 128/83 131/88  (!) 143/90  Pulse: (!) 51 (!) 55  (!) 56  Resp:  18    Temp:  97.9 F (36.6 C)    TempSrc:  Oral    SpO2: 95% 98%    Weight:   115.2 kg   Height:        Intake/Output Summary (Last 24 hours) at 01/04/2019 1430 Last data filed at 01/03/2019 1751 Gross per 24 hour  Intake 393.71 ml  Output 150 ml  Net 243.71 ml   Filed Weights   01/03/19 0645 01/03/19 1059 01/04/19 0621  Weight: 118.1 kg 114.8 kg 115.2 kg    Examination: General exam: Alert, awake, oriented x 3, in no acute distress; no CP, no SOB. Afebrile and with good O2 sat on RA. Respiratory system: Clear to auscultation. Respiratory effort normal. Cardiovascular system:RRR. No murmurs, rubs, gallops. Gastrointestinal system: Abdomen is nondistended, soft and nontender. No organomegaly or masses felt. Normal bowel sounds heard. Central nervous system: Alert and oriented. No focal neurological deficits. Extremities: No Cyanosis, no clubbing, 2+ edema bilaterally.  Skin: No rashes, lesions or ulcers Psychiatry: Judgement and insight appear fair. Mood & affect appropriate.   Data Reviewed: I have personally reviewed following labs and imaging studies  CBC: Recent Labs  Lab 12/30/18 0237 12/31/18 0229 01/01/19 0421 01/02/19 0214 01/03/19 0238 01/04/19 0313  WBC 7.1 7.8 6.9 7.7 7.4 6.7  NEUTROABS 4.6 5.3 4.4  --   --   --   HGB 8.3* 8.4* 8.3* 8.3* 8.4* 8.0*  HCT 25.4* 25.3*  24.9* 24.2* 26.0* 23.8*  MCV 91.4 90.0 94.0 92.0 92.5 92.2  PLT 326 345 362 339 343 629   Basic Metabolic Panel: Recent Labs  Lab 12/31/18 0229 01/01/19 0421 01/02/19 0214 01/03/19 0238 01/04/19 0313  NA 130* 133* 130* 131* 132*  K 4.0 4.2 4.5 5.2* 4.5  CL 92* 95* 93* 94* 96*  CO2 24 29 28 25 25   GLUCOSE 80 84 85 83 78  BUN 31* 19 29* 44* 30*  CREATININE 5.11* 4.25* 5.81* 7.14* 5.78*  CALCIUM 7.9* 8.2* 8.2*  8.5* 8.5*  PHOS 3.8 4.2 4.9* 6.1* 4.8*   GFR: Estimated Creatinine Clearance: 22.1 mL/min (A) (by C-G formula based on SCr of 5.78 mg/dL (H)).   Liver Function Tests: Recent Labs  Lab 12/29/18 0446 12/30/18 0237 12/31/18 0229 01/01/19 0421 01/02/19 0214 01/03/19 0238 01/04/19 0313  AST 49* 41  --   --   --   --   --   ALT 17 12  --   --   --   --   --   ALKPHOS 73 84  --   --   --   --   --   BILITOT 0.3 0.5  --   --   --   --   --   PROT 4.9* 5.1*  --   --   --   --   --   ALBUMIN 2.0* 2.1*  2.1* 2.1* 2.1* 2.2* 2.4* 2.3*   Cardiac Enzymes: Recent Labs  Lab 12/29/18 0446  CKTOTAL 984*   CBG: Recent Labs  Lab 12/31/18 0757 12/31/18 1209 12/31/18 2231 01/02/19 2150 01/02/19 2215  GLUCAP 80 95 110* 75 76    Recent Results (from the past 240 hour(s))  Culture, Urine     Status: Abnormal   Collection Time: 12/25/18  5:00 PM  Result Value Ref Range Status   Specimen Description URINE, CLEAN CATCH  Final   Special Requests   Final    NONE Performed at [facility], [place] [address], [place], [place] [address]    Culture >=100,000 COLONIES/mL STAPHYLOCOCCUS EPIDERMIDIS (A)  Final   Report Status 12/27/2018 FINAL  Final   Organism ID, Bacteria STAPHYLOCOCCUS EPIDERMIDIS (A)  Final      Susceptibility   Staphylococcus epidermidis - MIC*    CIPROFLOXACIN >=8 RESISTANT Resistant     GENTAMICIN <=0.5 SENSITIVE Sensitive     NITROFURANTOIN <=16 SENSITIVE Sensitive     OXACILLIN >=4 RESISTANT Resistant     TETRACYCLINE <=1 SENSITIVE Sensitive     VANCOMYCIN 2 SENSITIVE Sensitive     TRIMETH/SULFA <=10 SENSITIVE Sensitive     CLINDAMYCIN <=0.25 SENSITIVE Sensitive     RIFAMPIN <=0.5 SENSITIVE Sensitive     Inducible Clindamycin NEGATIVE Sensitive     * >=100,000 COLONIES/mL STAPHYLOCOCCUS EPIDERMIDIS  Culture, blood (routine x 2)     Status: None   Collection Time: 12/27/18  7:35 AM  Result Value Ref Range Status   Specimen Description BLOOD LEFT HAND  Final    Special Requests   Final    BOTTLES DRAWN AEROBIC ONLY Blood Culture adequate volume   Culture   Final    NO GROWTH 5 DAYS Performed at [facility], [place] [address] [place], [place] [address]    Report Status 01/01/2019 FINAL  Final  Culture, blood (routine x 2)     Status: None   Collection Time: 12/27/18  7:37 AM  Result Value Ref Range Status  Specimen Description BLOOD LEFT WRIST  Final   Special Requests   Final    BOTTLES DRAWN AEROBIC ONLY Blood Culture adequate volume   Culture   Final    NO GROWTH 5 DAYS Performed at [facility], [address] [place], [place] [address]    Report Status 01/01/2019 FINAL  Final  Culture, blood (Routine X 2) w Reflex to ID Panel     Status: None   Collection Time: 12/28/18 10:00 AM  Result Value Ref Range Status   Specimen Description BLOOD LEFT HAND  Final   Special Requests   Final    BOTTLES DRAWN AEROBIC AND ANAEROBIC Blood Culture adequate volume   Culture   Final    NO GROWTH 5 DAYS Performed at [facility], [place] [address], [place], [place] [address]    Report Status 01/02/2019 FINAL  Final  Culture, blood (Routine X 2) w Reflex to ID Panel     Status: None   Collection Time: 12/28/18 10:15 AM  Result Value Ref Range Status   Specimen Description BLOOD LEFT HAND  Final   Special Requests   Final    BOTTLES DRAWN AEROBIC AND ANAEROBIC Blood Culture adequate volume   Culture   Final    NO GROWTH 5 DAYS Performed at [facility], [name] [address], [place], [place] [address]    Report Status 01/02/2019 FINAL  Final     Scheduled Meds: . amLODipine  10 mg Oral Daily  . Chlorhexidine Gluconate Cloth  6 each Topical Q0600  . [START ON 01/05/2019] darbepoetin (ARANESP) injection - DIALYSIS  40 mcg Intravenous Q Thu-HD  . feeding supplement (ENSURE ENLIVE)  237 mL Oral BID BM  . FLUoxetine  40 mg Oral Daily  . folic acid  1 mg Oral Daily  . loxapine  25 mg Oral Daily  . loxapine  50 mg Oral QHS  .  metoprolol tartrate  25 mg Oral BID  . polyethylene glycol  17 g Oral Daily  . risperiDONE  9 mg Oral QHS  . senna-docusate  1 tablet Oral BID  . thiamine  100 mg Oral Daily  . topiramate  250 mg Oral QHS  . traZODone  300 mg Oral QHS   Continuous Infusions: . sodium chloride    . sodium chloride    . sodium chloride Stopped (12/26/18 0534)  .  ceFAZolin (ANCEF) IV 1 g (01/03/19 1733)  . heparin 2,300 Units/hr (01/04/19 0742)     LOS: 16 days    Time spent: 30 minutes   Barton Dubois, MD [facility] [phone]  01/04/2019, 2:30 PM

## 2019-01-04 NOTE — Progress Notes (Signed)
ID Pharmacy Progress Note  [name] with MSSA bacteremia also requiring HD for AKI 2/2 rhabdo. Pt has been treated with cefazolin since 12/26/18. Repeat BCX NGTD, WBC WNL, afebrile, last HD 4/21.   Plan: Continue ancef 1g IV Q24hrs with planned end date of 01/24/19 (4 weeks from negative cultures) F/u renal function, HD schedule, s/sx clinical improvement  Can schedule ancef doses with HD if continues to require HD at discharge   Juanell Fairly, PharmD PGY1 Pharmacy Resident 01/04/2019 12:32 PM

## 2019-01-05 DIAGNOSIS — A4101 Sepsis due to Methicillin susceptible Staphylococcus aureus: Secondary | ICD-10-CM

## 2019-01-05 DIAGNOSIS — K0889 Other specified disorders of teeth and supporting structures: Secondary | ICD-10-CM

## 2019-01-05 LAB — RENAL FUNCTION PANEL
Albumin: 2.4 g/dL — ABNORMAL LOW (ref 3.5–5.0)
Anion gap: 11 (ref 5–15)
BUN: 42 mg/dL — ABNORMAL HIGH (ref 6–20)
CO2: 25 mmol/L (ref 22–32)
Calcium: 8.6 mg/dL — ABNORMAL LOW (ref 8.9–10.3)
Chloride: 97 mmol/L — ABNORMAL LOW (ref 98–111)
Creatinine, Ser: 6.99 mg/dL — ABNORMAL HIGH (ref 0.61–1.24)
GFR calc Af Amer: 10 mL/min — ABNORMAL LOW (ref >60)
GFR calc non Af Amer: 9 mL/min — ABNORMAL LOW (ref >60)
Glucose, Bld: 77 mg/dL (ref 70–99)
Phosphorus: 6 mg/dL — ABNORMAL HIGH (ref 2.5–4.6)
Potassium: 4.8 mmol/L (ref 3.5–5.1)
Sodium: 133 mmol/L — ABNORMAL LOW (ref 135–145)

## 2019-01-05 LAB — CBC
HCT: 23.6 % — ABNORMAL LOW (ref 39.0–52.0)
Hemoglobin: 7.9 g/dL — ABNORMAL LOW (ref 13.0–17.0)
MCH: 31.1 pg (ref 26.0–34.0)
MCHC: 33.5 g/dL (ref 30.0–36.0)
MCV: 92.9 fL (ref 80.0–100.0)
Platelets: 307 10*3/uL (ref 150–400)
RBC: 2.54 MIL/uL — ABNORMAL LOW (ref 4.22–5.81)
RDW: 12.5 % (ref 11.5–15.5)
WBC: 6.4 10*3/uL (ref 4.0–10.5)
nRBC: 0.3 % — ABNORMAL HIGH (ref 0.0–0.2)

## 2019-01-05 LAB — PROTIME-INR
INR: 1.3 — ABNORMAL HIGH (ref 0.8–1.2)
Prothrombin Time: 16.3 seconds — ABNORMAL HIGH (ref 11.4–15.2)

## 2019-01-05 LAB — HEPARIN LEVEL (UNFRACTIONATED): Heparin Unfractionated: 0.5 IU/mL (ref 0.30–0.70)

## 2019-01-05 MED ORDER — CEFAZOLIN SODIUM-DEXTROSE 2-4 GM/100ML-% IV SOLN
2.0000 g | INTRAVENOUS | Status: DC
  Administered 2019-01-05 – 2019-01-14 (×6): 2 g via INTRAVENOUS
  Filled 2019-01-05 (×9): qty 100

## 2019-01-05 MED ORDER — HEPARIN SODIUM (PORCINE) 1000 UNIT/ML IJ SOLN
INTRAMUSCULAR | Status: AC
  Filled 2019-01-05: qty 4

## 2019-01-05 MED ORDER — DARBEPOETIN ALFA 40 MCG/0.4ML IJ SOSY
PREFILLED_SYRINGE | INTRAMUSCULAR | Status: AC
  Filled 2019-01-05: qty 0.4

## 2019-01-05 MED ORDER — HEPARIN SODIUM (PORCINE) 1000 UNIT/ML IJ SOLN
3.8000 mL | Freq: Once | INTRAMUSCULAR | Status: AC
  Administered 2019-01-05: 3800 [IU] via INTRAVENOUS

## 2019-01-05 MED ORDER — WARFARIN SODIUM 5 MG PO TABS
10.0000 mg | ORAL_TABLET | Freq: Once | ORAL | Status: AC
  Administered 2019-01-05: 17:00:00 10 mg via ORAL
  Filled 2019-01-05: qty 2

## 2019-01-05 NOTE — Progress Notes (Signed)
[facility] Infectious [facility]  Date of Admission:  12/19/2018     Total days of antibiotics: 12  Day 12 Cefazolin            Patient ID: James [name] is a 43 y.o. male with  Principal Problem:   MSSA bacteremia Active Problems:   Diabetes mellitus ([place])   Bipolar affective disorder ([place])   ARF (acute renal failure) ([place])   Alcoholism ([place])   AKI (acute kidney injury) ([place])   Hemodialysis catheter infection ([place])   Traumatic rhabdomyolysis ([place])   Thrombosis of right internal jugular vein ([place])   . amLODipine  10 mg Oral Daily  . Chlorhexidine Gluconate Cloth  6 each Topical Q0600  . darbepoetin (ARANESP) injection - DIALYSIS  40 mcg Intravenous Q Thu-HD  . feeding supplement (ENSURE ENLIVE)  237 mL Oral BID BM  . FLUoxetine  40 mg Oral Daily  . folic acid  1 mg Oral Daily  . loxapine  25 mg Oral Daily  . loxapine  50 mg Oral QHS  . polyethylene glycol  17 g Oral Daily  . risperiDONE  9 mg Oral QHS  . senna-docusate  1 tablet Oral BID  . thiamine  100 mg Oral Daily  . topiramate  250 mg Oral QHS  . traZODone  300 mg Oral QHS  . warfarin  10 mg Oral ONCE-1800  . Warfarin - Pharmacist Dosing Inpatient   Does not apply q1800    SUBJECTIVE: No complaints.  He is curious as to when he will be released home and is not quite certain when this will happen.  He tells me he is getting up walking around with physical therapy nicely.  His right neck is causing him no pain or trouble.  He does have some soreness at the site of the insertion of his permanent HD catheter.  He has been tolerating his antibiotics well without concern for side effect.   Review of Systems: Review of Systems  All other systems reviewed and are negative.   No Known Allergies  OBJECTIVE: Vitals:   01/05/19 1105 01/05/19 1155 01/05/19 1315 01/05/19 1427  BP: (!) 148/83 (!) 147/86 (!) 159/76 (!) 151/84  Pulse: (!) 57 (!) 54 (!) 55 (!) 59  Resp: 18  16 15   Temp: 97.8 F  (36.6 C)  98 F (36.7 C)   TempSrc: Oral  Oral   SpO2: 95%  98% 96%  Weight: 115.3 kg     Height:       Body mass index is 31.77 kg/m.   Physical Exam Constitutional:      Comments: Resting comfortably in bed watching television.  No distress  HENT:     Mouth/Throat:     Mouth: Mucous membranes are moist.     Pharynx: Oropharynx is clear.     Comments: Poor dentition. Neck:     Comments: Right lateral neck with small palpable nodule.  Dialysis catheter site is clean dry and without drainage to dressing. Cardiovascular:     Rate and Rhythm: Normal rate and regular rhythm.     Pulses: Normal pulses.     Heart sounds: No murmur.  Pulmonary:     Effort: Pulmonary effort is normal.     Breath sounds: Normal breath sounds.  Skin:    General: Skin is warm and dry.  Neurological:     Mental Status: He is oriented to person, place, and time.  Lab Results Lab Results  Component Value Date   WBC 6.4 01/05/2019   HGB 7.9 (L) 01/05/2019   HCT 23.6 (L) 01/05/2019   MCV 92.9 01/05/2019   PLT 307 01/05/2019    Lab Results  Component Value Date   CREATININE 6.99 (H) 01/05/2019   BUN 42 (H) 01/05/2019   NA 133 (L) 01/05/2019   K 4.8 01/05/2019   CL 97 (L) 01/05/2019   CO2 25 01/05/2019    Lab Results  Component Value Date   ALT 12 12/30/2018   AST 41 12/30/2018   ALKPHOS 84 12/30/2018   BILITOT 0.5 12/30/2018     Microbiology: BCx 4/12 >> MSSA 2/2 sets BCx 4/14 >> pending (line still in) BCx 4/15 >> not yet drawn   ASSESSMENT & PLAN:   1. MSSA Bacteremia = Fever on hospital day 8 and found to be bacteremic. TTE negative for IE and only mild MV regurg and trivial TV regurgitation. Exam otherwise unrevealing for cause - likely related to HD line.  Repeated blood cultures remain negative and no other signs of metastatic infection. R IJ thrombus was identified after he had a line holiday. I have a concern for possible seeding of this and may need to extend out  antibiotics to cover for possible septic thrombophlebitis considering he will have a perm cath tunneled.   2. AKI with Acute HD Need = urine output picking up. CK improving. Nephrology is following closely and assessing daily for HD needs, hopeful for long term recovery but anticipated to be slow. HD 3x/week Tuesday, Thursday, Saturday schedule.  3. ?Early Cirrhosis = hep b antigen negative. Hep c Ab negative. Likely due to fatty liver disease and excessive/frequent alcohol consumption.   4. R IJ Thrombus = currently getting anticoagulation; in light of this finding, which is a new development compared to previous imaging studies. Concern detailed in #1. 4 week Cefazolin.  Currently still getting IV heparin and awaiting transition to oral regimen.   Please continue Cefazolin after HD through May 12th to complete 4 weeks of therapy.  From an ID perspective he is ready for discharge and can receive outpatient antibiotics with his dialysis center.  I understand there are ongoing efforts to arrange where this will happen close to his home.  Janene Madeira, MSN, NP-C [facility] for Infectious [place] Cell: [phone] Pager: [phone]  01/05/2019  3:53 PM

## 2019-01-05 NOTE — Discharge Instructions (Addendum)
Information on my medicine - Coumadin   (Warfarin)   Why was Coumadin prescribed for you? Coumadin was prescribed for you because you have a blood clot or a medical condition that can cause an increased risk of forming blood clots. Blood clots can cause serious health problems by blocking the flow of blood to the heart, lung, or brain. Coumadin can prevent harmful blood clots from forming. As a reminder your indication for Coumadin is:   Deep Vein Thrombosis Treatment  What test will check on my response to Coumadin? While on Coumadin (warfarin) you will need to have an INR test regularly to ensure that your dose is keeping you in the desired range. The INR (international normalized ratio) number is calculated from the result of the laboratory test called prothrombin time (PT).  If an INR APPOINTMENT HAS NOT ALREADY BEEN MADE FOR YOU please schedule an appointment to have this lab work done by your health care provider within 7 days. Your INR goal is usually a number between:  2 to 3 or your provider may give you a more narrow range like 2-2.5.  Ask your health care provider during an office visit what your goal INR is.  What  do you need to  know  About  COUMADIN? Take Coumadin (warfarin) exactly as prescribed by your healthcare provider about the same time each day.  DO NOT stop taking without talking to the doctor who prescribed the medication.  Stopping without other blood clot prevention medication to take the place of Coumadin may increase your risk of developing a new clot or stroke.  Get refills before you run out.  What do you do if you miss a dose? If you miss a dose, take it as soon as you remember on the same day then continue your regularly scheduled regimen the next day.  Do not take two doses of Coumadin at the same time.  Important Safety Information A possible side effect of Coumadin (Warfarin) is an increased risk of bleeding. You should call your healthcare provider right away  if you experience any of the following: ? Bleeding from an injury or your nose that does not stop. ? Unusual colored urine (red or dark brown) or unusual colored stools (red or black). ? Unusual bruising for unknown reasons. ? A serious fall or if you hit your head (even if there is no bleeding).  Some foods or medicines interact with Coumadin (warfarin) and might alter your response to warfarin. To help avoid this: ? Eat a balanced diet, maintaining a consistent amount of Vitamin K. ? Notify your provider about major diet changes you plan to make. ? Avoid alcohol or limit your intake to 1 drink for women and 2 drinks for men per day. (1 drink is 5 oz. wine, 12 oz. beer, or 1.5 oz. liquor.)  Make sure that ANY health care provider who prescribes medication for you knows that you are taking Coumadin (warfarin).  Also make sure the healthcare provider who is monitoring your Coumadin knows when you have started a new medication including herbals and non-prescription products.  Coumadin (Warfarin)  Major Drug Interactions  Increased Warfarin Effect Decreased Warfarin Effect  Alcohol (large quantities) Antibiotics (esp. Septra/Bactrim, Flagyl, Cipro) Amiodarone (Cordarone) Aspirin (ASA) Cimetidine (Tagamet) Megestrol (Megace) NSAIDs (ibuprofen, naproxen, etc.) Piroxicam (Feldene) Propafenone (Rythmol SR) Propranolol (Inderal) Isoniazid (INH) Posaconazole (Noxafil) Barbiturates (Phenobarbital) Carbamazepine (Tegretol) Chlordiazepoxide (Librium) Cholestyramine (Questran) Griseofulvin Oral Contraceptives Rifampin Sucralfate (Carafate) Vitamin K   Coumadin (Warfarin) Major Herbal Interactions  Increased Warfarin Effect Decreased Warfarin Effect  Garlic Ginseng Ginkgo biloba Coenzyme Q10 Green tea St. Johns wort    Coumadin (Warfarin) FOOD Interactions  Eat a consistent number of servings per week of foods HIGH in Vitamin K (1 serving =  cup)  Collards (cooked, or boiled &  drained) Kale (cooked, or boiled & drained) Mustard greens (cooked, or boiled & drained) Parsley *serving size only =  cup Spinach (cooked, or boiled & drained) Swiss chard (cooked, or boiled & drained) Turnip greens (cooked, or boiled & drained)  Eat a consistent number of servings per week of foods MEDIUM-HIGH in Vitamin K (1 serving = 1 cup)  Asparagus (cooked, or boiled & drained) Broccoli (cooked, boiled & drained, or raw & chopped) Brussel sprouts (cooked, or boiled & drained) *serving size only =  cup Lettuce, raw (green leaf, endive, romaine) Spinach, raw Turnip greens, raw & chopped   These websites have more information on Coumadin (warfarin):  FailFactory.se; VeganReport.com.au;    [facility]  Discharge Instructions  AV Fistula or Graft Surgery for Dialysis Access  Please refer to the following instructions for your post-procedure care. Your surgeon or physician assistant will discuss any changes with you.  Activity  You may drive the day following your surgery, if you are comfortable and no longer taking prescription pain medication. Resume full activity as the soreness in your incision resolves.  Bathing/Showering  You may shower after you go home. Keep your incision dry for 48 hours. Do not soak in a bathtub, hot tub, or swim until the incision heals completely. You may not shower if you have a hemodialysis catheter.  Incision Care  Clean your incision with mild soap and water after 48 hours. Pat the area dry with a clean towel. You do not need a bandage unless otherwise instructed. Do not apply any ointments or creams to your incision. You may have skin glue on your incision. Do not peel it off. It will come off on its own in about one week. Your arm may swell a bit after surgery. To reduce swelling use pillows to elevate your arm so it is above your heart. Your doctor will tell you if you need to lightly wrap  your arm with an ACE bandage.  Diet  Resume your normal diet. There are not special food restrictions following this procedure. In order to heal from your surgery, it is CRITICAL to get adequate nutrition. Your body requires vitamins, minerals, and protein. Vegetables are the best source of vitamins and minerals. Vegetables also provide the perfect balance of protein. Processed food has little nutritional value, so try to avoid this.  Medications  Resume taking all of your medications. If your incision is causing pain, you may take over-the counter pain relievers such as acetaminophen (Tylenol). If you were prescribed a stronger pain medication, please be aware these medications can cause nausea and constipation. Prevent nausea by taking the medication with a snack or meal. Avoid constipation by drinking plenty of fluids and eating foods with high amount of fiber, such as fruits, vegetables, and grains.  Do not take Tylenol if you are taking prescription pain medications.  Follow up Your surgeon may want to see you in the office following your access surgery. If so, this will be arranged at the time of your surgery.  Please call us immediately for any of the following conditions:  Increased pain, redness, drainage (pus) from your incision site Fever of 101 degrees or  higher Severe or worsening pain at your incision site Hand pain or numbness.  Reduce your risk of vascular disease:  Stop smoking. If you would like help, call [facility] at 1-800-QUIT-NOW 681-779-6113) or [place] at [place] your cholesterol Maintain a desired weight Control your diabetes Keep your blood pressure down  Dialysis  It will take several weeks to several months for your new dialysis access to be ready for use. Your surgeon will determine when it is okay to use it. Your nephrologist will continue to direct your dialysis. You can continue to use your Permcath until your new access is ready  for use.   01/09/2019 James [name] [id number] 10/13/1975  Surgeon(s): Early, Arvilla Meres, MD  Procedure(s): Creation left radial cephalic AV fistula  x Do not stick fistula for 12 weeks    If you have any questions, please call the office at [phone].

## 2019-01-05 NOTE — Progress Notes (Signed)
ANTICOAGULATION CONSULT NOTE - Follow Up Consult  Pharmacy Consult for Heparin / Warfarin Indication: DVT  Labs: Recent Labs    01/03/19 0238 01/03/19 1945 01/04/19 0313 01/04/19 1552 01/05/19 0311 01/05/19 1158  HGB 8.4*  --  8.0*  --  7.9*  --   HCT 26.0*  --  23.8*  --  23.6*  --   PLT 343  --  324  --  307  --   LABPROT  --   --   --  13.9  --  16.3*  INR  --   --   --  1.1  --  1.3*  HEPARINUNFRC 0.17* 0.29* 0.29*  --  0.50  --   CREATININE 7.14*  --  5.78*  --  6.99*  --     Assessment: 43yo male continues on heparin / warfarin for IJ thrombus INR 1.3 today Heparin level therapeutic  Goal of Therapy:  Heparin level 0.3-0.7 units/ml   Plan:  Continue heparin at 2300 units / hr Repeat Warfarin 10 mg po x 1 Follow up AM labs   Thank you Anette Guarneri, PharmD [phone]

## 2019-01-05 NOTE — Progress Notes (Signed)
Patient ID: James [name], male   DOB: June 15, 1976, 43 y.o.   MRN: [id number] [facility] Progress Note   Assessment/ Plan:   #  Acute kidney Injury:  - 2/2 ATN. Hx rhabdo and polysubstance abuse.  nonoliguric - Continues to be dialysis dependent  - HD today per TTS schedule for now  - tunneled catheter placed 4/20 (IR) - Recovery may be slow and protracted based on severity of injury.   - Check post-void residual bladder scan and in/out cath if retains over 300 - Started CLIP process for outpatient dialysis unit placement.  Closest to [employer] [facility].  # Bradycardia - Discontinued metoprolol   #  Volume overload: optimize with HD  #  Bipolar disorder/anxiety: management per primary service.  #  History of alcohol abuse: initial ultrasound suggestive of early development of cirrhosis (suspect fatty liver has a contributory effect).  # Hyponatremia: Secondary to acute kidney injury and unrestricted oral fluid intake.  Fluid restrict and HD as above   # MSSA bacteremia: On intravenous cefazolin.  Status post catheter holiday as above. Last blood cultures NGTD   #  Right IJ thrombus: on heparin gtt; per primary team   # Anemia - secondary in part to AKI.  Start aranesp - first dose 4/23.  No acute indication for PRBC's  Subjective:    Seen on HD.  Tolerating fluid removal with B P132/80.  HR is 54 and RN reports low as 42.  Pt is not aware of any arrhythmia issue.  He feels well.  Spoke with team on 4/22 and he is being kept inpatient in part for antibiotics.  Pt reports urinating without foley but did have to try a little harder.   Review of systems:  Denies shortness of breath, chest pain Denies nausea or vomiting     Objective:   BP 125/73   Pulse (!) 51   Temp 97.8 F (36.6 C) (Oral)   Resp 14   Ht 6\' 3"  (1.905 m)   Wt 117.8 kg   SpO2 94%   BMI 32.46 kg/m   Intake/Output Summary (Last 24 hours) at 01/05/2019 0854 Last data filed at 01/05/2019  9169 Gross per 24 hour  Intake 276 ml  Output 1050 ml  Net -774 ml   Weight change: 1.9 kg  Physical Exam: Gen: adult male in bed in NAD at rest CVS: bradycardia; no rub Resp: clear and unlabored Abd: Soft, obese, nontender Ext: 1+ edema lower extremities bilaterally  Imaging: No results found.  Labs: BMET Recent Labs  Lab 12/30/18 0237 12/31/18 0229 01/01/19 0421 01/02/19 0214 01/03/19 0238 01/04/19 0313 01/05/19 0311  NA 128* 130* 133* 130* 131* 132* 133*  K 4.7 4.0 4.2 4.5 5.2* 4.5 4.8  CL 92* 92* 95* 93* 94* 96* 97*  CO2 24 24 29 28 25 25 25   GLUCOSE 82 80 84 85 83 78 77  BUN 58* 31* 19 29* 44* 30* 42*  CREATININE 8.03* 5.11* 4.25* 5.81* 7.14* 5.78* 6.99*  CALCIUM 8.2* 7.9* 8.2* 8.2* 8.5* 8.5* 8.6*  PHOS 5.5* 3.8 4.2 4.9* 6.1* 4.8* 6.0*   CBC Recent Labs  Lab 12/30/18 0237 12/31/18 0229 01/01/19 0421 01/02/19 0214 01/03/19 0238 01/04/19 0313 01/05/19 0311  WBC 7.1 7.8 6.9 7.7 7.4 6.7 6.4  NEUTROABS 4.6 5.3 4.4  --   --   --   --   HGB 8.3* 8.4* 8.3* 8.3* 8.4* 8.0* 7.9*  HCT 25.4* 25.3* 24.9* 24.2* 26.0* 23.8* 23.6*  MCV  91.4 90.0 94.0 92.0 92.5 92.2 92.9  PLT 326 345 362 339 343 324 307    Medications:    . amLODipine  10 mg Oral Daily  . Chlorhexidine Gluconate Cloth  6 each Topical Q0600  . darbepoetin (ARANESP) injection - DIALYSIS  40 mcg Intravenous Q Thu-HD  . feeding supplement (ENSURE ENLIVE)  237 mL Oral BID BM  . FLUoxetine  40 mg Oral Daily  . folic acid  1 mg Oral Daily  . loxapine  25 mg Oral Daily  . loxapine  50 mg Oral QHS  . polyethylene glycol  17 g Oral Daily  . risperiDONE  9 mg Oral QHS  . senna-docusate  1 tablet Oral BID  . thiamine  100 mg Oral Daily  . topiramate  250 mg Oral QHS  . traZODone  300 mg Oral QHS  . Warfarin - Pharmacist Dosing Inpatient   Does not apply q1800   Claudia Desanctis 01/05/2019

## 2019-01-05 NOTE — Progress Notes (Addendum)
PROGRESS NOTE    James [name]  MWN:[id number] DOB: 30-Aug-1976 DOA: 12/19/2018 PCP: No primary care provider on file.   Brief Narrative:43 year old male with history of hypertension, diabetes mellitus type 2, bipolar disorder presented on 12/17/2018 to [facility] after being found down for 2 days following binge alcohol drinking. He was found to have AKI, metabolic acidosis, leukocytosis and LFT elevation with rhabdomyolysis, CK of 58,000. He was treated with aggressive IV fluids but unfortunately his renal function got worse with decreased urine output and CK rose to 130,000. No improvement with IV diuresis. He was transferred to [facility] on 12/19/2018. Nephrology was consulted. Tunneled HD catheter was inserted and hemodialysis was started on 12/20/2018.  HD catheter subsequently removed 12/27/2018.  Patient also noted to have a MSSA bacteremia.  ID consulted.  Assessment & Plan:   Principal Problem:   MSSA bacteremia Active Problems:   Diabetes mellitus ([place])   Bipolar affective disorder ([place])   ARF (acute renal failure) ([place])   Alcoholism ([place])   AKI (acute kidney injury) ([place])   Hemodialysis catheter infection ([place])   Traumatic rhabdomyolysis ([place])   Thrombosis of right internal jugular vein ([place])  #1.  Acute renal failure likely secondary to rhabdomyolysis/ATN/polysubstance abuse.  Patient started on hemodialysis via a tunneled hemodialysis catheter on 12/20/2018.  Renal function still not showing much improvement post hemodialysis. Nephrology following Patient may need outpatient hemodialysis chair for continuation of outpatient hemodialysis prior to discharge. Continue to monitor renal function.  #2.  Volume overload.  Likely secondary to acute renal failure.  Responsive to hemodialysis.  Patient clinically asymptomatic.  #3.  Bipolar disorder with anxiety.  Continue with SSRI, Risperdal, Topamax and trazodone.  #4.  Right internal jugular vein  thrombosis. Currently on heparin drip to bridge with warfarin. Continue to monitor INR daily Will plan to discharge with INR of 2-3.  #5.  MSSA bacteremia.  Currently on cefazolin.  Status post removal of catheter. Blood culture showed no growth.  #6.  Polysubstance abuse.  Patient has no symptoms of alcohol withdrawal.  Initially on Librium which has been discontinued.  #7.  Diabetes mellitus type 2.  Hemoglobin A1c 5.2 Continue with sliding scale insulin Fingersticks AC at bedtime Hypoglycemic protocol  #8.  Detectable troponin likely secondary to acute kidney injury from rhabdomyolysis/ATN.  Patient denies any chest pain. No intervention required.  #9.  Hypertension.  Controlled with Lopressor and Norvasc. Continue with current medication. Monitor blood pressure  DVT prophylaxis: Heparin gtt   Code Status: Full code   Family Communication:   Disposition Plan:  Pending discharge with available outpatient hemodialysis chair/INR at therapeutic    Consultants:   Nephrology  Infectious disease  Procedures: Tunneled right IJ catheter placement Antimicrobials: Cefazolin 3 times a week with hemodialysis     Subjective:  Patient has no new complaints.  Denies any chest pain, palpitation or diaphoresis.  Also denies any nausea, vomiting or diarrhea.  Renal function appears not to improve as much.    Objective: Vitals:   01/05/19 1105 01/05/19 1155 01/05/19 1315 01/05/19 1427  BP: (!) 148/83 (!) 147/86 (!) 159/76 (!) 151/84  Pulse: (!) 57 (!) 54 (!) 55 (!) 59  Resp: 18  16 15   Temp: 97.8 F (36.6 C)  98 F (36.7 C)   TempSrc: Oral  Oral   SpO2: 95%  98% 96%  Weight: 115.3 kg     Height:        Intake/Output Summary (Last 24 hours) at  01/05/2019 1611 Last data filed at 01/05/2019 1302 Gross per 24 hour  Intake 276 ml  Output 3450 ml  Net -3174 ml   Filed Weights   01/05/19 0430 01/05/19 0645 01/05/19 1105  Weight: 116.7 kg 117.8 kg 115.3 kg     Examination:  General exam: Appears calm and comfortable.  Obese and not in acute distress Respiratory system: Clear to auscultation. Respiratory effort normal. Cardiovascular system: S1 & S2 heard, RRR. No JVD, murmurs, rubs, gallops or clicks.  1+ pitting pedal edema bilaterally. Gastrointestinal system: Abdomen is nondistended, soft and nontender. No organomegaly or masses felt. Normal bowel sounds heard. Central nervous system: Alert and oriented. No focal neurological deficits. Extremities: Symmetric 5 x 5 power. Skin: No rashes, lesions or ulcers noted on exposed part of the skin. Psychiatry: Judgement and insight appear normal. Mood & affect appropriate.     Data Reviewed: I have personally reviewed following labs and imaging studies  CBC: Recent Labs  Lab 12/30/18 0237 12/31/18 0229 01/01/19 0421 01/02/19 0214 01/03/19 0238 01/04/19 0313 01/05/19 0311  WBC 7.1 7.8 6.9 7.7 7.4 6.7 6.4  NEUTROABS 4.6 5.3 4.4  --   --   --   --   HGB 8.3* 8.4* 8.3* 8.3* 8.4* 8.0* 7.9*  HCT 25.4* 25.3* 24.9* 24.2* 26.0* 23.8* 23.6*  MCV 91.4 90.0 94.0 92.0 92.5 92.2 92.9  PLT 326 345 362 339 343 324 431   Basic Metabolic Panel: Recent Labs  Lab 01/01/19 0421 01/02/19 0214 01/03/19 0238 01/04/19 0313 01/05/19 0311  NA 133* 130* 131* 132* 133*  K 4.2 4.5 5.2* 4.5 4.8  CL 95* 93* 94* 96* 97*  CO2 29 28 25 25 25   GLUCOSE 84 85 83 78 77  BUN 19 29* 44* 30* 42*  CREATININE 4.25* 5.81* 7.14* 5.78* 6.99*  CALCIUM 8.2* 8.2* 8.5* 8.5* 8.6*  PHOS 4.2 4.9* 6.1* 4.8* 6.0*   GFR: Estimated Creatinine Clearance: 18.3 mL/min (A) (by C-G formula based on SCr of 6.99 mg/dL (H)). Liver Function Tests: Recent Labs  Lab 12/30/18 0237  01/01/19 0421 01/02/19 0214 01/03/19 0238 01/04/19 0313 01/05/19 0311  AST 41  --   --   --   --   --   --   ALT 12  --   --   --   --   --   --   ALKPHOS 84  --   --   --   --   --   --   BILITOT 0.5  --   --   --   --   --   --   PROT 5.1*  --   --    --   --   --   --   ALBUMIN 2.1*  2.1*   < > 2.1* 2.2* 2.4* 2.3* 2.4*   < > = values in this interval not displayed.   No results for input(s): LIPASE, AMYLASE in the last 168 hours. No results for input(s): AMMONIA in the last 168 hours. Coagulation Profile: Recent Labs  Lab 01/04/19 1552 01/05/19 1158  INR 1.1 1.3*   Cardiac Enzymes: No results for input(s): CKTOTAL, CKMB, CKMBINDEX, TROPONINI in the last 168 hours. BNP (last 3 results) No results for input(s): PROBNP in the last 8760 hours. HbA1C: No results for input(s): HGBA1C in the last 72 hours. CBG: Recent Labs  Lab 12/31/18 0757 12/31/18 1209 12/31/18 2231 01/02/19 2150 01/02/19 2215  GLUCAP 80 95 110* 75 76   Lipid  Profile: No results for input(s): CHOL, HDL, LDLCALC, TRIG, CHOLHDL, LDLDIRECT in the last 72 hours. Thyroid Function Tests: No results for input(s): TSH, T4TOTAL, FREET4, T3FREE, THYROIDAB in the last 72 hours. Anemia Panel: No results for input(s): VITAMINB12, FOLATE, FERRITIN, TIBC, IRON, RETICCTPCT in the last 72 hours. Sepsis Labs: No results for input(s): PROCALCITON, LATICACIDVEN in the last 168 hours.  Recent Results (from the past 240 hour(s))  Culture, blood (routine x 2)     Status: None   Collection Time: 12/27/18  7:35 AM  Result Value Ref Range Status   Specimen Description BLOOD LEFT HAND  Final   Special Requests   Final    BOTTLES DRAWN AEROBIC ONLY Blood Culture adequate volume   Culture   Final    NO GROWTH 5 DAYS Performed at [facility], [address], [place], [place] [address]    Report Status 01/01/2019 FINAL  Final  Culture, blood (routine x 2)     Status: None   Collection Time: 12/27/18  7:37 AM  Result Value Ref Range Status   Specimen Description BLOOD LEFT WRIST  Final   Special Requests   Final    BOTTLES DRAWN AEROBIC ONLY Blood Culture adequate volume   Culture   Final    NO GROWTH 5 DAYS Performed at [facility], [place] [address],  [place], [place] [address]    Report Status 01/01/2019 FINAL  Final  Culture, blood (Routine X 2) w Reflex to ID Panel     Status: None   Collection Time: 12/28/18 10:00 AM  Result Value Ref Range Status   Specimen Description BLOOD LEFT HAND  Final   Special Requests   Final    BOTTLES DRAWN AEROBIC AND ANAEROBIC Blood Culture adequate volume   Culture   Final    NO GROWTH 5 DAYS Performed at [facility], [place] [address], [place], [place] [address]    Report Status 01/02/2019 FINAL  Final  Culture, blood (Routine X 2) w Reflex to ID Panel     Status: None   Collection Time: 12/28/18 10:15 AM  Result Value Ref Range Status   Specimen Description BLOOD LEFT HAND  Final   Special Requests   Final    BOTTLES DRAWN AEROBIC AND ANAEROBIC Blood Culture adequate volume   Culture   Final    NO GROWTH 5 DAYS Performed at [facility], [place] [address], [place], [place] [address]    Report Status 01/02/2019 FINAL  Final         Radiology Studies: No results found.      Scheduled Meds: . amLODipine  10 mg Oral Daily  . Chlorhexidine Gluconate Cloth  6 each Topical Q0600  . darbepoetin (ARANESP) injection - DIALYSIS  40 mcg Intravenous Q Thu-HD  . feeding supplement (ENSURE ENLIVE)  237 mL Oral BID BM  . FLUoxetine  40 mg Oral Daily  . folic acid  1 mg Oral Daily  . loxapine  25 mg Oral Daily  . loxapine  50 mg Oral QHS  . polyethylene glycol  17 g Oral Daily  . risperiDONE  9 mg Oral QHS  . senna-docusate  1 tablet Oral BID  . thiamine  100 mg Oral Daily  . topiramate  250 mg Oral QHS  . traZODone  300 mg Oral QHS  . warfarin  10 mg Oral ONCE-1800  . Warfarin - Pharmacist Dosing Inpatient   Does not apply [phone]  Continuous Infusions: . sodium chloride    . sodium chloride    . sodium chloride Stopped (12/26/18 0534)  .  ceFAZolin (ANCEF) IV    . heparin 2,300 Units/hr (01/04/19 1847)     LOS: 17 days        Elie Confer, MD [facility]  Pager [phone]  If 7PM-7AM, please contact night-coverage [email or web address] Password [email or web address] 01/05/2019, 4:11 PM

## 2019-01-05 NOTE — Care Management Important Message (Signed)
Important Message  Patient Details  Name: James [name] MRN: [id number] Date of Birth: 08-17-1976   Medicare Important Message Given:  Yes    [name] 01/05/2019, 2:40 PM

## 2019-01-06 ENCOUNTER — Encounter (HOSPITAL_COMMUNITY): Payer: Medicare HMO

## 2019-01-06 DIAGNOSIS — I10 Essential (primary) hypertension: Secondary | ICD-10-CM

## 2019-01-06 DIAGNOSIS — N186 End stage renal disease: Secondary | ICD-10-CM

## 2019-01-06 DIAGNOSIS — Z992 Dependence on renal dialysis: Secondary | ICD-10-CM

## 2019-01-06 LAB — RENAL FUNCTION PANEL
Albumin: 2.5 g/dL — ABNORMAL LOW (ref 3.5–5.0)
Anion gap: 9 (ref 5–15)
BUN: 28 mg/dL — ABNORMAL HIGH (ref 6–20)
CO2: 26 mmol/L (ref 22–32)
Calcium: 8.5 mg/dL — ABNORMAL LOW (ref 8.9–10.3)
Chloride: 99 mmol/L (ref 98–111)
Creatinine, Ser: 5.38 mg/dL — ABNORMAL HIGH (ref 0.61–1.24)
GFR calc Af Amer: 14 mL/min — ABNORMAL LOW (ref >60)
GFR calc non Af Amer: 12 mL/min — ABNORMAL LOW (ref >60)
Glucose, Bld: 75 mg/dL (ref 70–99)
Phosphorus: 4.8 mg/dL — ABNORMAL HIGH (ref 2.5–4.6)
Potassium: 4.4 mmol/L (ref 3.5–5.1)
Sodium: 134 mmol/L — ABNORMAL LOW (ref 135–145)

## 2019-01-06 LAB — CBC
HCT: 25.4 % — ABNORMAL LOW (ref 39.0–52.0)
Hemoglobin: 8.3 g/dL — ABNORMAL LOW (ref 13.0–17.0)
MCH: 30.6 pg (ref 26.0–34.0)
MCHC: 32.7 g/dL (ref 30.0–36.0)
MCV: 93.7 fL (ref 80.0–100.0)
Platelets: 309 10*3/uL (ref 150–400)
RBC: 2.71 MIL/uL — ABNORMAL LOW (ref 4.22–5.81)
RDW: 12.4 % (ref 11.5–15.5)
WBC: 6.5 10*3/uL (ref 4.0–10.5)
nRBC: 0 % (ref 0.0–0.2)

## 2019-01-06 LAB — PROTIME-INR
INR: 2.2 — ABNORMAL HIGH (ref 0.8–1.2)
Prothrombin Time: 24.1 seconds — ABNORMAL HIGH (ref 11.4–15.2)

## 2019-01-06 LAB — HEPARIN LEVEL (UNFRACTIONATED): Heparin Unfractionated: 0.47 IU/mL (ref 0.30–0.70)

## 2019-01-06 MED ORDER — WARFARIN SODIUM 2.5 MG PO TABS
2.5000 mg | ORAL_TABLET | Freq: Once | ORAL | Status: AC
  Administered 2019-01-06: 2.5 mg via ORAL
  Filled 2019-01-06: qty 1

## 2019-01-06 MED ORDER — CHLORHEXIDINE GLUCONATE CLOTH 2 % EX PADS: 6.0000 | MEDICATED_PAD | Freq: Every day | CUTANEOUS | Status: DC

## 2019-01-06 NOTE — Progress Notes (Signed)
ANTICOAGULATION CONSULT NOTE - Follow Up Consult  Pharmacy Consult for Heparin > Warfarin Indication: DVT (IJ thrombus)  No Known Allergies  Patient Measurements: Height: 6\' 3"  (190.5 cm) Weight: 254 lb 3.1 oz (115.3 kg) IBW/kg (Calculated) : 84.5 Heparin Dosing Weight: 110 kg  Vital Signs: Temp: 98.4 F (36.9 C) (04/24 0614) Temp Source: Oral (04/24 0614) BP: 153/90 (04/24 0614) Pulse Rate: 64 (04/24 0614)  Labs: Recent Labs    01/04/19 0313 01/04/19 1552 01/05/19 0311 01/05/19 1158 01/06/19 0215  HGB 8.0*  --  7.9*  --  8.3*  HCT 23.8*  --  23.6*  --  25.4*  PLT 324  --  307  --  309  LABPROT  --  13.9  --  16.3* 24.1*  INR  --  1.1  --  1.3* 2.2*  HEPARINUNFRC 0.29*  --  0.50  --  0.47  CREATININE 5.78*  --  6.99*  --  5.38*    Estimated Creatinine Clearance: 23.7 mL/min (A) (by C-G formula based on SCr of 5.38 mg/dL (H)).   Medications:  Scheduled:  . amLODipine  10 mg Oral Daily  . Chlorhexidine Gluconate Cloth  6 each Topical Q0600  . darbepoetin (ARANESP) injection - DIALYSIS  40 mcg Intravenous Q Thu-HD  . feeding supplement (ENSURE ENLIVE)  237 mL Oral BID BM  . FLUoxetine  40 mg Oral Daily  . folic acid  1 mg Oral Daily  . loxapine  25 mg Oral Daily  . loxapine  50 mg Oral QHS  . polyethylene glycol  17 g Oral Daily  . risperiDONE  9 mg Oral QHS  . senna-docusate  1 tablet Oral BID  . thiamine  100 mg Oral Daily  . topiramate  250 mg Oral QHS  . traZODone  300 mg Oral QHS  . Warfarin - Pharmacist Dosing Inpatient   Does not apply q1800   Infusions:  . sodium chloride    . sodium chloride    . sodium chloride Stopped (12/26/18 0534)  .  ceFAZolin (ANCEF) IV 2 g (01/05/19 1731)  . heparin 2,300 Units/hr (01/06/19 0448)    Assessment: [name] continues on heparin and warfarin for IJ thrombus.  Heparin is therapeutic on 2300 units/hr.  Noted INR with rapid rise today after only 2 doses of warfarin.  Pt more sensitive to warfarin dosing than  expected.  Per guidelines, need to continue heparin x 5 days despite therapeutic INR.  Today is day #3/5 of IV to enteral overlap for anticoagulation.  INR 1.3 > 2.2  Goal of Therapy:  INR 2-3 Heparin level 0.3-0.7 units/ml Monitor platelets by anticoagulation protocol: Yes   Plan:  Continue heparin at 2300 units/hr Reduce warfarin dose to 2.5mg  PO x 1 tonight Daily heparin level, CBC, and INR Warfarin education completed 4/23.  [employer], Pharm.D., BCPS Clinical Pharmacist Clinical phone for 01/06/2019 from 8:30-4:00 is [phone].  **Pharmacist phone directory can now be found on [email or web address] ([email or web address]).  Listed under [place].  01/06/2019 11:14 AM

## 2019-01-06 NOTE — Progress Notes (Signed)
Occupational Therapy Treatment + Discharge Patient Details Name: James [name] MRN: [id number] DOB: 06/28/1976 Today's Date: 01/06/2019    History of present illness Pt is a 43 y/o male admitted after fall. Found to have acute renal failure secondary to rhabdomyolysis. Pt is s/p tunneled HD catheter and started HD on 4/7. PMH includes DM, bipolar disorder, HTN, and alcohol abuse.    OT comments  Pt progressing and completing all goals. Pt with significant swelling reduction and is modified independent for ADL and mobility with no AD. Pt sitting EOB for sock donning/doffing and sit to stand for simulating donning pants in standing. Pt continues to have fair to good balance with challenges and has a standing tolerance >5 mins at sink. HEP performed for BUEs and pt introduced a towel as a dowel for AAROM needs. Pt's swelling in BLEs reduced and pt has met all OT goals and does not require continued OT skilled services. OT signing off.    Follow Up Recommendations  No OT follow up    Equipment Recommendations  None recommended by OT    Recommendations for Other Services      Precautions / Restrictions Precautions Precautions: Fall Restrictions Weight Bearing Restrictions: No       Mobility Bed Mobility Overal bed mobility: Modified Independent Bed Mobility: Supine to Sit           General bed mobility comments: Increased time and moderate reliance of rails to transition to EOB.   Transfers Overall transfer level: Needs assistance Equipment used: Rolling walker (2 wheeled)   Sit to Stand: Supervision              Balance Overall balance assessment: Needs assistance Sitting-balance support: No upper extremity supported;Feet supported Sitting balance-Leahy Scale: Good                                     ADL either performed or assessed with clinical judgement   ADL Overall ADL's : Needs assistance/impaired                      Lower Body Dressing: Supervision/safety;Sit to/from stand               Functional mobility during ADLs: Min guard General ADL Comments: pt limited secondary to reports  decreased swelling in BLEs increasing ability to perform ADL.     Vision       Perception     Praxis      Cognition Arousal/Alertness: Awake/alert Behavior During Therapy: Flat affect Overall Cognitive Status: Within Functional Limits for tasks assessed                                 General Comments: increased time for processing        Exercises Exercises: Other exercises Other Exercises Other Exercises: Shoulder flex; abductionl retraction; protraction and elbow flex/ext   Shoulder Instructions       General Comments pt education on current HEP and use of dowel for AAROM for shoulder movements. Pt recalled current HEP from 2 days ago with 100% accuracy. Pt has handout provided in [facility] health folder and plans to perform exercises daily. Pt now supervision level for LB dressing with no physical assist required.     Pertinent Vitals/ Pain       Pain Assessment: Faces Pain Score:  0-No pain Faces Pain Scale: No hurt  Home Living                                          Prior Functioning/Environment              Frequency  Min 2X/week        Progress Toward Goals  OT Goals(current goals can now be found in the care plan section)  Progress towards OT goals: Progressing toward goals  Acute Rehab OT Goals Patient Stated Goal: To get better at the stairs OT Goal Formulation: With patient Time For Goal Achievement: 01/07/19 Potential to Achieve Goals: Good ADL Goals Pt Will Perform Grooming: Independently Pt Will Perform Upper Body Dressing: Independently Pt Will Perform Lower Body Dressing: Independently Pt Will Transfer to Toilet: Independently;ambulating  Plan Discharge plan remains appropriate    Co-evaluation                 AM-PAC  OT "6 Clicks" Daily Activity     Outcome Measure   Help from another person eating meals?: None Help from another person taking care of personal grooming?: None Help from another person toileting, which includes using toliet, bedpan, or urinal?: None Help from another person bathing (including washing, rinsing, drying)?: None Help from another person to put on and taking off regular upper body clothing?: None Help from another person to put on and taking off regular lower body clothing?: None 6 Click Score: 24    End of Session    OT Visit Diagnosis: Unsteadiness on feet (R26.81);Other abnormalities of gait and mobility (R26.89)   Activity Tolerance Patient limited by lethargy   Patient Left in bed;with call bell/phone within reach   Nurse Communication Mobility status        Time: 0258-5277 OT Time Calculation (min): 16 min  Charges: OT General Charges $OT Visit: 1 Visit OT Treatments $Self Care/Home Management : 8-22 mins  Darryl Nestle) Marsa Aris OTR/L Acute Rehabilitation Services Pager: [phone] Office: [phone]    Audie Pinto 01/06/2019, 9:53 AM

## 2019-01-06 NOTE — Plan of Care (Signed)
  Problem: Education: Goal: Knowledge of General Education information will improve Description Including pain rating scale, medication(s)/side effects and non-pharmacologic comfort measures Outcome: Progressing Note:  POC reviewed with pt.   

## 2019-01-06 NOTE — Progress Notes (Signed)
Patient ID: James [name], male   DOB: 1975-12-14, 43 y.o.   MRN: [id number]  [facility] Progress Note   Assessment/ Plan:   #  Acute kidney Injury:  - 2/2 ATN. Hx rhabdo and polysubstance abuse.  Nonoliguric.  First HD was 4/7 - Continues to be dialysis dependent  - HD today per TTS schedule for now  - tunneled catheter placed 4/20 (IR) - Recovery may be slow and protracted based on severity of injury.   - Started CLIP process for outpatient dialysis unit placement.  He does not yet have a dialysis chair.  Closest to [employer] - Will obtain vein mapping and vascular consult for AVF  # Bradycardia - I have discontinued metoprolol   #  Volume overload: optimize with HD  #  Bipolar disorder/anxiety: management per primary service.  #  History of alcohol abuse: initial ultrasound suggestive of early development of cirrhosis (suspect fatty liver has a contributory effect).  # Hyponatremia: Secondary to acute kidney injury and unrestricted oral fluid intake.  Fluid restrict and HD as above   # MSSA bacteremia: On intravenous cefazolin.  Status post catheter holiday as above. Last blood cultures NGTD   #  Right IJ thrombus:  per primary team   # Anemia - secondary in part to AKI.  aranesp - first dose 4/23.  No acute indication for PRBC's  Subjective:   Urinating without difficulty today.  Feels well today.  HD yesterday with 3 kg UF - dialysis went well.  We discussed the lack of improvement in his renal function is concerning and I let him know that I would order ultrasound for vein mapping for permanent access should he remain on dialysis.  He is ok with this.  Review of systems:  Denies shortness of breath, chest pain Denies nausea or vomiting     Objective:   BP (!) 153/90 (BP Location: Right Arm)   Pulse 64   Temp 98.4 F (36.9 C) (Oral)   Resp 16   Ht 6\' 3"  (1.905 m)   Wt 115.3 kg   SpO2 94%   BMI 31.77 kg/m   Intake/Output Summary (Last 24  hours) at 01/06/2019 0858 Last data filed at 01/06/2019 0840 Gross per 24 hour  Intake 486.33 ml  Output 3650 ml  Net -3163.67 ml   Weight change: -1.4 kg  Physical Exam: Gen: adult male in bed in NAD at rest  CVS: RRR; no rub Resp: clear and unlabored Abd: Soft, obese, nontender Ext: 2+ edema lower extremities bilaterally Neuro: alert and oriented x 3  Access: right IJ tunneled catheter   Imaging: No results found.  Labs: BMET Recent Labs  Lab 12/31/18 0229 01/01/19 0421 01/02/19 0214 01/03/19 0238 01/04/19 0313 01/05/19 0311 01/06/19 0215  NA 130* 133* 130* 131* 132* 133* 134*  K 4.0 4.2 4.5 5.2* 4.5 4.8 4.4  CL 92* 95* 93* 94* 96* 97* 99  CO2 24 29 28 25 25 25 26   GLUCOSE 80 84 85 83 78 77 75  BUN 31* 19 29* 44* 30* 42* 28*  CREATININE 5.11* 4.25* 5.81* 7.14* 5.78* 6.99* 5.38*  CALCIUM 7.9* 8.2* 8.2* 8.5* 8.5* 8.6* 8.5*  PHOS 3.8 4.2 4.9* 6.1* 4.8* 6.0* 4.8*   CBC Recent Labs  Lab 12/31/18 0229 01/01/19 0421  01/03/19 0238 01/04/19 0313 01/05/19 0311 01/06/19 0215  WBC 7.8 6.9   < > 7.4 6.7 6.4 6.5  NEUTROABS 5.3 4.4  --   --   --   --   --  HGB 8.4* 8.3*   < > 8.4* 8.0* 7.9* 8.3*  HCT 25.3* 24.9*   < > 26.0* 23.8* 23.6* 25.4*  MCV 90.0 94.0   < > 92.5 92.2 92.9 93.7  PLT 345 362   < > 343 324 307 309   < > = values in this interval not displayed.    Medications:    . amLODipine  10 mg Oral Daily  . Chlorhexidine Gluconate Cloth  6 each Topical Q0600  . darbepoetin (ARANESP) injection - DIALYSIS  40 mcg Intravenous Q Thu-HD  . feeding supplement (ENSURE ENLIVE)  237 mL Oral BID BM  . FLUoxetine  40 mg Oral Daily  . folic acid  1 mg Oral Daily  . loxapine  25 mg Oral Daily  . loxapine  50 mg Oral QHS  . polyethylene glycol  17 g Oral Daily  . risperiDONE  9 mg Oral QHS  . senna-docusate  1 tablet Oral BID  . thiamine  100 mg Oral Daily  . topiramate  250 mg Oral QHS  . traZODone  300 mg Oral QHS  . Warfarin - Pharmacist Dosing Inpatient    Does not apply q1800   Claudia Desanctis 01/06/2019

## 2019-01-06 NOTE — Progress Notes (Signed)
PROGRESS NOTE    James [name]  PYP:[id number] DOB: [birth year] DOA: 12/19/2018 PCP: No primary care provider on file.     Brief Narrative: 43 year old male with history of hypertension, diabetes mellitus type 2, bipolar disorder presented on 12/17/2018 to [facility] after being found down for 2 days following binge alcohol drinking. He was found to have AKI, metabolic acidosis, leukocytosis and LFT elevation with rhabdomyolysis, CK of 58,000. He was treated with aggressive IV fluids but unfortunately his renal function got worse with decreased urine output and CK rose to 130,000. No improvement with IV diuresis. He was transferred to [facility] on 12/19/2018. Nephrology was consulted. Tunneled HD catheter was inserted and hemodialysis was started on 12/20/2018. HD catheter subsequently removed 12/27/2018. Patient also noted to have a MSSA bacteremia. ID consulted.    Assessment & Plan:   Principal Problem:   MSSA bacteremia Active Problems:   Diabetes mellitus ([place])   Bipolar affective disorder ([place])   ARF (acute renal failure) ([place])   Alcoholism ([place])   AKI (acute kidney injury) ([place])   Hemodialysis catheter infection ([place])   Traumatic rhabdomyolysis ([place])   Thrombosis of right internal jugular vein ([place])  #1.  Acute renal failure likely secondary to rhabdomyolysis/ATN/polysubstance abuse.  Patient started on hemodialysis via a tunneled hemodialysis catheter on 12/20/2018.  Renal function still not showing much improvement post hemodialysis. Nephrology following. Patient being evaluated by vascular surgery for vascular access placement. Patient may need outpatient hemodialysis chair for continuation of outpatient hemodialysis prior to discharge. Continue to monitor renal function.  #2.  Volume overload.  Likely secondary to acute renal failure.  Responsive to hemodialysis.  Patient clinically asymptomatic.  #3.  Bipolar disorder with anxiety.  Continue  with SSRI, Risperdal, Topamax and trazodone.  #4.  Right internal jugular vein thrombosis. Currently on heparin drip to bridge with warfarin. Continue to monitor INR daily Will plan to discharge with INR of 2-3.  #5.  MSSA bacteremia.  Currently on cefazolin.  Status post removal of catheter. Blood culture showed no growth.  #6.  Polysubstance abuse/Alcoholism.  Patient has no symptoms of alcohol withdrawal.  Initially on Librium which has been discontinued.  #7.  Diabetes mellitus type 2.  Hemoglobin A1c 5.2 Continue with sliding scale insulin Fingersticks AC at bedtime Hypoglycemic protocol  #8.  Detectable troponin likely secondary to acute kidney injury from rhabdomyolysis/ATN.  Patient denies any chest pain. No intervention required.  #9.  Hypertension.  Controlled with Lopressor and Norvasc. Continue with current medication. Monitor blood pressure    DVT prophylaxis: Heparin gtt  Code Status: Full Code    Family Communication:   Disposition Plan:Pending vascular access  Consultants:   Vascular   Nephrology   Procedures: RIJ perm cath placement     Antimicrobials: Cefazolin 3 times a week    Subjective: Patient seen and evaluated in bed. No new complaints today. Discussed the need for vascular access/OP HD chair prior to d/c. Denies any signs of withdrawal.  Objective: Vitals:   01/05/19 1427 01/05/19 2103 01/06/19 0614 01/06/19 1407  BP: (!) 151/84 (!) 145/83 (!) 153/90 (!) 146/83  Pulse: (!) 59 64 64 67  Resp: 15 16 16 18   Temp:  98.2 F (36.8 C) 98.4 F (36.9 C) 98.8 F (37.1 C)  TempSrc:  Oral Oral Oral  SpO2: 96% 97% 94% 95%  Weight:      Height:        Intake/Output Summary (Last 24 hours) at 01/06/2019 1617 Last  data filed at 01/06/2019 1500 Gross per 24 hour  Intake 1046 ml  Output 1150 ml  Net -104 ml   Filed Weights   01/05/19 0430 01/05/19 0645 01/05/19 1105  Weight: 116.7 kg 117.8 kg 115.3 kg    Examination:   General exam: Appears calm and comfortable  Respiratory system: Clear to auscultation. Respiratory effort normal. Cardiovascular system: S1 & S2 heard, RRR. No JVD, murmurs, rubs, gallops or clicks. No pedal edema. Gastrointestinal system: Abdomen is nondistended, soft and nontender. No organomegaly or masses felt. Normal bowel sounds heard. Central nervous system: Alert and oriented. No focal neurological deficits. Extremities: 1 + pitting pedal edema. Symmetric 5 x 5 power. Skin: No rashes, lesions or ulcers Psychiatry: Judgement and insight appear normal. Mood & affect appropriate.     Data Reviewed: I have personally reviewed following labs and imaging studies  CBC: Recent Labs  Lab 12/31/18 0229 01/01/19 0421 01/02/19 0214 01/03/19 0238 01/04/19 0313 01/05/19 0311 01/06/19 0215  WBC 7.8 6.9 7.7 7.4 6.7 6.4 6.5  NEUTROABS 5.3 4.4  --   --   --   --   --   HGB 8.4* 8.3* 8.3* 8.4* 8.0* 7.9* 8.3*  HCT 25.3* 24.9* 24.2* 26.0* 23.8* 23.6* 25.4*  MCV 90.0 94.0 92.0 92.5 92.2 92.9 93.7  PLT 345 362 339 343 324 307 188   Basic Metabolic Panel: Recent Labs  Lab 01/02/19 0214 01/03/19 0238 01/04/19 0313 01/05/19 0311 01/06/19 0215  NA 130* 131* 132* 133* 134*  K 4.5 5.2* 4.5 4.8 4.4  CL 93* 94* 96* 97* 99  CO2 28 25 25 25 26   GLUCOSE 85 83 78 77 75  BUN 29* 44* 30* 42* 28*  CREATININE 5.81* 7.14* 5.78* 6.99* 5.38*  CALCIUM 8.2* 8.5* 8.5* 8.6* 8.5*  PHOS 4.9* 6.1* 4.8* 6.0* 4.8*   GFR: Estimated Creatinine Clearance: 23.7 mL/min (A) (by C-G formula based on SCr of 5.38 mg/dL (H)). Liver Function Tests: Recent Labs  Lab 01/02/19 0214 01/03/19 0238 01/04/19 0313 01/05/19 0311 01/06/19 0215  ALBUMIN 2.2* 2.4* 2.3* 2.4* 2.5*   No results for input(s): LIPASE, AMYLASE in the last 168 hours. No results for input(s): AMMONIA in the last 168 hours. Coagulation Profile: Recent Labs  Lab 01/04/19 1552 01/05/19 1158 01/06/19 0215  INR 1.1 1.3* 2.2*   Cardiac  Enzymes: No results for input(s): CKTOTAL, CKMB, CKMBINDEX, TROPONINI in the last 168 hours. BNP (last 3 results) No results for input(s): PROBNP in the last 8760 hours. HbA1C: No results for input(s): HGBA1C in the last 72 hours. CBG: Recent Labs  Lab 12/31/18 0757 12/31/18 1209 12/31/18 2231 01/02/19 2150 01/02/19 2215  GLUCAP 80 95 110* 75 76   Lipid Profile: No results for input(s): CHOL, HDL, LDLCALC, TRIG, CHOLHDL, LDLDIRECT in the last 72 hours. Thyroid Function Tests: No results for input(s): TSH, T4TOTAL, FREET4, T3FREE, THYROIDAB in the last 72 hours. Anemia Panel: No results for input(s): VITAMINB12, FOLATE, FERRITIN, TIBC, IRON, RETICCTPCT in the last 72 hours. Sepsis Labs: No results for input(s): PROCALCITON, LATICACIDVEN in the last 168 hours.  Recent Results (from the past 240 hour(s))  Culture, blood (Routine X 2) w Reflex to ID Panel     Status: None   Collection Time: 12/28/18 10:00 AM  Result Value Ref Range Status   Specimen Description BLOOD LEFT HAND  Final   Special Requests   Final    BOTTLES DRAWN AEROBIC AND ANAEROBIC Blood Culture adequate volume   Culture   Final  NO GROWTH 5 DAYS Performed at [facility], [place] [address], [place], [place] [address]    Report Status 01/02/2019 FINAL  Final  Culture, blood (Routine X 2) w Reflex to ID Panel     Status: None   Collection Time: 12/28/18 10:15 AM  Result Value Ref Range Status   Specimen Description BLOOD LEFT HAND  Final   Special Requests   Final    BOTTLES DRAWN AEROBIC AND ANAEROBIC Blood Culture adequate volume   Culture   Final    NO GROWTH 5 DAYS Performed at [facility], [place] [address] [place], [place] [address]    Report Status 01/02/2019 FINAL  Final         Radiology Studies: No results found.      Scheduled Meds: . amLODipine  10 mg Oral Daily  . Chlorhexidine Gluconate Cloth  6 each Topical Q0600  . darbepoetin (ARANESP) injection - DIALYSIS  40 mcg  Intravenous Q Thu-HD  . feeding supplement (ENSURE ENLIVE)  237 mL Oral BID BM  . FLUoxetine  40 mg Oral Daily  . folic acid  1 mg Oral Daily  . loxapine  25 mg Oral Daily  . loxapine  50 mg Oral QHS  . polyethylene glycol  17 g Oral Daily  . risperiDONE  9 mg Oral QHS  . senna-docusate  1 tablet Oral BID  . thiamine  100 mg Oral Daily  . topiramate  250 mg Oral QHS  . traZODone  300 mg Oral QHS  . warfarin  2.5 mg Oral ONCE-1800  . Warfarin - Pharmacist Dosing Inpatient   Does not apply q1800   Continuous Infusions: . sodium chloride    . sodium chloride    . sodium chloride Stopped (12/26/18 0534)  .  ceFAZolin (ANCEF) IV 2 g (01/05/19 1731)  . heparin 2,300 Units/hr (01/06/19 1441)     LOS: 18 days      Elie Confer, MD [facility] Pager [phone]  If 7PM-7AM, please contact night-coverage [email or web address] Password [facility] 01/06/2019, 4:17 PM

## 2019-01-06 NOTE — Consult Note (Addendum)
Hospital Consult    Reason for Consult: Permanent dialysis access Requesting Physician: Dr. Royce Macadamia MRN #:  [id number]  History of Present Illness: This is a 43 y.o. male with past medical history significant for hypertension, type 2 diabetes mellitus, bipolar disorder, polysubstance abuse who is now end-stage renal disease on hemodialysis.  He is being seen in consultation for evaluation of permanent dialysis access placement.  He had a right IJ TDC placed by interventional radiology on 01/02/2019.  He is right arm dominant and would prefer access placement in his left arm.  Patient would like to be discharged home and come back as an outpatient for permanent dialysis access.  Patient is also on Coumadin for IJ thrombus and now has a therapeutic INR.  He does not have a pacemaker.  Past Medical History:  Diagnosis Date   Bipolar 1 disorder ([place])    Cellulitis 08/24/2012   "RLE and spot on my right forearm" (08/24/2012)   Type II diabetes mellitus ([place]) ~ 2009   "take Metformin" (08/24/2012)    Past Surgical History:  Procedure Laterality Date   EYE SURGERY     FOOT FRACTURE SURGERY     "left; scaffold broke" 08/24/2012)   IR FLUORO GUIDE CV LINE LEFT  12/30/2018   IR FLUORO GUIDE CV LINE RIGHT  12/20/2018   IR FLUORO GUIDE CV LINE RIGHT  01/02/2019   IR US GUIDE VASC ACCESS LEFT  12/30/2018   IR US GUIDE VASC ACCESS RIGHT  12/20/2018   IR US GUIDE VASC ACCESS RIGHT  01/02/2019   OPEN ANTERIOR SHOULDER RECONSTRUCTION  ~ 2003   "right; kept dislocating; cut it open; sewed muscle back together; moved cartilage around and stapled that" (08/24/2012)    No Known Allergies  Prior to Admission medications   Medication Sig Start Date End Date Taking? Authorizing Provider  atorvastatin (LIPITOR) 40 MG tablet Take 40 mg by mouth at bedtime. 11/10/18  Yes [provider]  clonazePAM (KLONOPIN) 1 MG tablet Take 1 mg by mouth 2 (two) times daily. 11/17/18  Yes [provider]  FLUoxetine (PROZAC) 40 MG capsule Take 40 mg by mouth every morning.    Yes [provider]  gemfibrozil (LOPID) 600 MG tablet Take 600 mg by mouth 2 (two) times daily. 11/10/18  Yes [provider]  loxapine (LOXITANE) 25 MG capsule Take 25 mg by mouth every morning. 11/29/18  Yes [provider]  loxapine (LOXITANE) 50 MG capsule Take 50 mg by mouth at bedtime. 11/29/18  Yes [provider]  metFORMIN (GLUCOPHAGE) 500 MG tablet Take 500 mg by mouth 2 (two) times daily. 11/10/18  Yes [provider]  risperiDONE (RISPERDAL) 3 MG tablet Take 9 mg by mouth at bedtime.    Yes [provider]  topiramate (TOPAMAX) 100 MG tablet Take 250 mg by mouth at bedtime. 11/29/18  Yes [provider]  traZODone (DESYREL) 100 MG tablet Take 300 mg by mouth at bedtime. 11/29/18  Yes [provider]    Social History   Socioeconomic History   Marital status: Single    Spouse name: Not on file   Number of children: Not on file   Years of education: Not on file   Highest education level: Not on file  Occupational History   Not on file  Social Needs   Financial resource strain: Not on file   Food insecurity:    Worry: Patient refused    Inability: Patient refused   Transportation needs:  Medical: Yes    Non-medical: Yes  Tobacco Use   Smoking status: Former Smoker    Packs/day: 0.00    Years: 25.00    Pack years: 0.00   Smokeless tobacco: Never Used   Tobacco comment: 08/24/2012 "stopped both snuff and chew ~ 10 yr ago"  Substance and Sexual Activity   Alcohol use: Yes    Alcohol/week: 12.0 standard drinks    Types: 12 Cans of beer per week   Drug use: No   Sexual activity: Not Currently  Lifestyle   Physical activity:    Days per week: 0 days    Minutes per session: 0 min   Stress: Not at all  Relationships   Social connections:    Talks on phone: Twice a week    Gets together: Once a week     Attends religious service: Not on file    Active member of club or organization: Not on file    Attends meetings of clubs or organizations: Not on file    Relationship status: Not on file   Intimate partner violence:    Fear of current or ex partner: Patient refused    Emotionally abused: Patient refused    Physically abused: Patient refused    Forced sexual activity: Patient refused  Other Topics Concern   Not on file  Social History Narrative   Not on file     Family History  Problem Relation Age of Onset   Diabetic kidney disease Maternal Uncle     ROS: Otherwise negative unless mentioned in HPI  Physical Examination  Vitals:   01/05/19 2103 01/06/19 0614  BP: (!) 145/83 (!) 153/90  Pulse: 64 64  Resp: 16 16  Temp: 98.2 F (36.8 C) 98.4 F (36.9 C)  SpO2: 97% 94%   Body mass index is 31.77 kg/m.  General:  WDWN in NAD Gait: Not observed HENT: WNL, normocephalic Pulmonary: normal non-labored breathing, without Rales, rhonchi,  wheezing Cardiac: regularAbdomen:  soft Skin: without rashes Vascular Exam/Pulses: Symmetrical radial pulses Extremities: without ischemic changes, without Gangrene , without cellulitis; without open wounds;  Musculoskeletal: no muscle wasting or atrophy  Neurologic: A&O X 3;  No focal weakness or paresthesias are detected; speech is fluent/normal Psychiatric:  The pt has Normal affect. Lymph:  Unremarkable  CBC    Component Value Date/Time   WBC 6.5 01/06/2019 0215   RBC 2.71 (L) 01/06/2019 0215   HGB 8.3 (L) 01/06/2019 0215   HCT 25.4 (L) 01/06/2019 0215   PLT 309 01/06/2019 0215   MCV 93.7 01/06/2019 0215   MCH 30.6 01/06/2019 0215   MCHC 32.7 01/06/2019 0215   RDW 12.4 01/06/2019 0215   LYMPHSABS 1.4 01/01/2019 0421   MONOABS 0.6 01/01/2019 0421   EOSABS 0.2 01/01/2019 0421   BASOSABS 0.1 01/01/2019 0421    BMET    Component Value Date/Time   NA 134 (L) 01/06/2019 0215   K 4.4 01/06/2019 0215   CL 99 01/06/2019  0215   CO2 26 01/06/2019 0215   GLUCOSE 75 01/06/2019 0215   BUN 28 (H) 01/06/2019 0215   CREATININE 5.38 (H) 01/06/2019 0215   CALCIUM 8.5 (L) 01/06/2019 0215   GFRNONAA 12 (L) 01/06/2019 0215   GFRAA 14 (L) 01/06/2019 0215    COAGS: Lab Results  Component Value Date   INR 2.2 (H) 01/06/2019   INR 1.3 (H) 01/05/2019   INR 1.1 01/04/2019     Non-Invasive Vascular Imaging:   Vein map pending  ASSESSMENT/PLAN: This is a 43 y.o. male now with end-stage renal disease on hemodialysis in need of permanent dialysis access  - End-stage renal disease on HD via right IJ TDC dialyzing on a Tuesday Thursday Saturday schedule - Vein map pending - Right arm dominant; restrict left arm - Option #1 would be to remain in hospital to reverse coumadin and have dialysis access done prior to discharge early next week, or - Option #2 would be to discharge home today to continue hemodialysis via [facility] and come back as an outpatient when elective surgery restrictions are lifted - On-call vascular surgeon Dr. Carlis Abbott will evaluate the patient later today  Dagoberto Ligas PA-C [facility] [phone]  I have seen and evaluated the patient. I agree with the PA note as documented above. [name] with AKI secondary to ATN from rhabdo after being found down after binge drinking.  Right handed and would prefer left arm access.  Vein mapping is pending.  Offered option of bridging off coumadin through weekend and fistula Monday (he has a RIJ TDC).  He does not want to wait and wants to be discharged.  Discussed we are currently scheduling outpatient fistulas into mid May given COVID pandemic and he states he doesn't want access any sooner.  Will have to complete vein mapping to place on outpatient schedule.  Marty Heck, MD [facility] [place] Office: [phone] Pager: [phone]

## 2019-01-06 NOTE — Progress Notes (Signed)
Patient has been accepted at [facility] for OP HD on TTS schedule seat time of 11:45am. He needs to arrive at 11:00am on his first day of treatment to sign paperwork. If his first day of treatment is a Saturday, he will need to report to the clinic on the Friday prior to sign paperwork. Dialysis Coordinator informed Dr. Bartholomew Boards and patient of patient's seat schedule. A schedule letter will be sent to the hospital by [facility] Admissions Coordinator. Dialysis Coordinator will continue to follow patient through hospital discharge to ensure smooth transition from hospital to OP HD clinic. Dialysis Coordinator informed patient that although his OP HD clinic is arranged, this does not mean he is ready for discharge from the hospital. He stated understanding.  [place] [address], [place] [address] [place], [place] Dialysis Coordinator [phone]

## 2019-01-07 ENCOUNTER — Inpatient Hospital Stay (HOSPITAL_COMMUNITY): Payer: Medicare HMO

## 2019-01-07 DIAGNOSIS — N179 Acute kidney failure, unspecified: Secondary | ICD-10-CM

## 2019-01-07 DIAGNOSIS — T827XXD Infection and inflammatory reaction due to other cardiac and vascular devices, implants and grafts, subsequent encounter: Secondary | ICD-10-CM

## 2019-01-07 LAB — CBC
HCT: 25.5 % — ABNORMAL LOW (ref 39.0–52.0)
Hemoglobin: 8.2 g/dL — ABNORMAL LOW (ref 13.0–17.0)
MCH: 30.3 pg (ref 26.0–34.0)
MCHC: 32.2 g/dL (ref 30.0–36.0)
MCV: 94.1 fL (ref 80.0–100.0)
Platelets: 314 10*3/uL (ref 150–400)
RBC: 2.71 MIL/uL — ABNORMAL LOW (ref 4.22–5.81)
RDW: 12.6 % (ref 11.5–15.5)
WBC: 6 10*3/uL (ref 4.0–10.5)
nRBC: 0 % (ref 0.0–0.2)

## 2019-01-07 LAB — PROTIME-INR
INR: 2.9 — ABNORMAL HIGH (ref 0.8–1.2)
Prothrombin Time: 30.1 seconds — ABNORMAL HIGH (ref 11.4–15.2)

## 2019-01-07 LAB — RENAL FUNCTION PANEL
Albumin: 2.5 g/dL — ABNORMAL LOW (ref 3.5–5.0)
Anion gap: 12 (ref 5–15)
BUN: 36 mg/dL — ABNORMAL HIGH (ref 6–20)
CO2: 24 mmol/L (ref 22–32)
Calcium: 8.6 mg/dL — ABNORMAL LOW (ref 8.9–10.3)
Chloride: 98 mmol/L (ref 98–111)
Creatinine, Ser: 6.63 mg/dL — ABNORMAL HIGH (ref 0.61–1.24)
GFR calc Af Amer: 11 mL/min — ABNORMAL LOW (ref >60)
GFR calc non Af Amer: 9 mL/min — ABNORMAL LOW (ref >60)
Glucose, Bld: 78 mg/dL (ref 70–99)
Phosphorus: 5.7 mg/dL — ABNORMAL HIGH (ref 2.5–4.6)
Potassium: 4.2 mmol/L (ref 3.5–5.1)
Sodium: 134 mmol/L — ABNORMAL LOW (ref 135–145)

## 2019-01-07 LAB — HEPARIN LEVEL (UNFRACTIONATED): Heparin Unfractionated: 0.46 IU/mL (ref 0.30–0.70)

## 2019-01-07 MED ORDER — WARFARIN SODIUM 2 MG PO TABS
2.0000 mg | ORAL_TABLET | Freq: Once | ORAL | Status: DC
  Filled 2019-01-07: qty 1

## 2019-01-07 NOTE — Progress Notes (Addendum)
ANTICOAGULATION CONSULT NOTE - Follow Up Consult  Pharmacy Consult for Heparin > Warfarin Indication: DVT (IJ thrombus)  No Known Allergies  Patient Measurements: Height: 6\' 3"  (190.5 cm) Weight: 252 lb 3.3 oz (114.4 kg) IBW/kg (Calculated) : 84.5 Heparin Dosing Weight: 110 kg  Vital Signs: Temp: 97.5 F (36.4 C) (04/25 0721) Temp Source: Oral (04/25 0721) BP: 162/95 (04/25 0830) Pulse Rate: 46 (04/25 0830)  Labs: Recent Labs    01/05/19 0311 01/05/19 1158 01/06/19 0215 01/07/19 0225  HGB 7.9*  --  8.3* 8.2*  HCT 23.6*  --  25.4* 25.5*  PLT 307  --  309 314  LABPROT  --  16.3* 24.1* 30.1*  INR  --  1.3* 2.2* 2.9*  HEPARINUNFRC 0.50  --  0.47 0.46  CREATININE 6.99*  --  5.38* 6.63*    Estimated Creatinine Clearance: 19.2 mL/min (A) (by C-G formula based on SCr of 6.63 mg/dL (H)).   Medications:  Scheduled:  . amLODipine  10 mg Oral Daily  . Chlorhexidine Gluconate Cloth  6 each Topical Q0600  . darbepoetin (ARANESP) injection - DIALYSIS  40 mcg Intravenous Q Thu-HD  . feeding supplement (ENSURE ENLIVE)  237 mL Oral BID BM  . FLUoxetine  40 mg Oral Daily  . folic acid  1 mg Oral Daily  . loxapine  25 mg Oral Daily  . loxapine  50 mg Oral QHS  . polyethylene glycol  17 g Oral Daily  . risperiDONE  9 mg Oral QHS  . senna-docusate  1 tablet Oral BID  . thiamine  100 mg Oral Daily  . topiramate  250 mg Oral QHS  . traZODone  300 mg Oral QHS  . Warfarin - Pharmacist Dosing Inpatient   Does not apply q1800   Infusions:  . sodium chloride    . sodium chloride    . sodium chloride Stopped (12/26/18 0534)  .  ceFAZolin (ANCEF) IV 2 g (01/05/19 1731)  . heparin 2,300 Units/hr (01/07/19 0160)    Assessment: [name] continues on heparin and warfarin for IJ thrombus. Heparin is therapeutic on 2300 units/hr. Noted INR with rapid rise today after only 3 doses of warfarin.  Pt more sensitive to warfarin dosing than expected. Per guidelines, need to continue heparin x 5  days despite therapeutic INR.  Today is day #4/5 of IV to enteral overlap for anticoagulation.  INR up to 2.9. CBC stable. No bleeding or infusion issues noted.  Goal of Therapy:  INR 2-3 Heparin level 0.3-0.7 units/ml Monitor platelets by anticoagulation protocol: Yes   Plan:  Continue heparin at 2300 units/hr Reduce warfarin dose to 2 mg PO x 1 tonight Daily heparin level, CBC, and INR  ADDENDUM: Pt now agreeable to AVF placement, will hold warfarin and continue heparin until surgery.  Jackson Latino, PharmD PGY1 Pharmacy Resident Phone [phone] 01/07/2019     8:55 AM

## 2019-01-07 NOTE — Progress Notes (Signed)
Patient ID: James [name], male   DOB: 12-May-1976, 43 y.o.   MRN: [id number] [facility] Progress Note   Assessment/ Plan:   #  Acute kidney Injury:  - 2/2 ATN. Hx rhabdo.  Recovery may be slow and protracted based on severity of injury.  First HD was 4/7.  Tunneled catheter placed 4/20 (IR) - Continues to be dialysis dependent  - Vein mapping ordered and vascular surgery consulted  - HD today per TTS schedule.   - AVF placement prior to discharge.  Spoke with vascular surgery - appreciate Dr. Carlis Abbott.  They will plan for AVF once INR will allow.  Primary team is going to stop coumadin and start heparin gtt as below  # Bradycardia - I have discontinued metoprolol   # HTN  - UF today with HD   #  Volume overload: optimize with HD  #  Bipolar disorder/anxiety: management per primary service.  #  History of alcohol abuse: initial ultrasound suggestive of early development of cirrhosis (suspect fatty liver has a contributory effect).   # Hyponatremia: Secondary to acute kidney injury and unrestricted oral fluid intake.  Fluid restrict and HD as above   # MSSA bacteremia: On intravenous cefazolin.  Status post catheter holiday as above. Last blood cultures NGTD   #  Right IJ thrombus: will need to stop coumadin and transition back to heparin gtt for access placement.  I have spoken with his primary team and they are placing orders  # Anemia - secondary in part to AKI.  aranesp - first dose 4/23.  No acute indication for PRBC's    Subjective:   Polysubstance abuse previously charted and patient clarifies to me today that he has a history of alcohol abuse but not drug abuse.  He is willing to stay here for a fistula to be placed.  No UDS on chart.  Seen on HD.  179/99 and HR 49 on HD.  Tolerating UF goal.   Review of systems:  Denies shortness of breath, chest pain Denies nausea or vomiting     Objective:   BP (!) 173/98   Pulse (!) 54   Temp (!) 97.5 F  (36.4 C) (Oral)   Resp 16   Ht 6\' 3"  (1.905 m)   Wt 114.4 kg   SpO2 96%   BMI 31.52 kg/m   Intake/Output Summary (Last 24 hours) at 01/07/2019 0942 Last data filed at 01/07/2019 9470 Gross per 24 hour  Intake 1160 ml  Output 1550 ml  Net -390 ml   Weight change: -0.3 kg  Physical Exam: Gen: adult male in bed in NAD at rest  CVS: RRR; no rub Resp: clear and unlabored Abd: Soft, obese, nontender Ext: 2+ edema lower extremities bilaterally Neuro: alert and oriented x 3  Access: right IJ tunneled catheter   Imaging: No results found.  Labs: BMET Recent Labs  Lab 01/01/19 0421 01/02/19 0214 01/03/19 0238 01/04/19 0313 01/05/19 0311 01/06/19 0215 01/07/19 0225  NA 133* 130* 131* 132* 133* 134* 134*  K 4.2 4.5 5.2* 4.5 4.8 4.4 4.2  CL 95* 93* 94* 96* 97* 99 98  CO2 29 28 25 25 25 26 24   GLUCOSE 84 85 83 78 77 75 78  BUN 19 29* 44* 30* 42* 28* 36*  CREATININE 4.25* 5.81* 7.14* 5.78* 6.99* 5.38* 6.63*  CALCIUM 8.2* 8.2* 8.5* 8.5* 8.6* 8.5* 8.6*  PHOS 4.2 4.9* 6.1* 4.8* 6.0* 4.8* 5.7*   CBC Recent Labs  Lab  01/01/19 0421  01/04/19 0313 01/05/19 0311 01/06/19 0215 01/07/19 0225  WBC 6.9   < > 6.7 6.4 6.5 6.0  NEUTROABS 4.4  --   --   --   --   --   HGB 8.3*   < > 8.0* 7.9* 8.3* 8.2*  HCT 24.9*   < > 23.8* 23.6* 25.4* 25.5*  MCV 94.0   < > 92.2 92.9 93.7 94.1  PLT 362   < > 324 307 309 314   < > = values in this interval not displayed.    Medications:    . amLODipine  10 mg Oral Daily  . Chlorhexidine Gluconate Cloth  6 each Topical Q0600  . darbepoetin (ARANESP) injection - DIALYSIS  40 mcg Intravenous Q Thu-HD  . feeding supplement (ENSURE ENLIVE)  237 mL Oral BID BM  . FLUoxetine  40 mg Oral Daily  . folic acid  1 mg Oral Daily  . loxapine  25 mg Oral Daily  . loxapine  50 mg Oral QHS  . polyethylene glycol  17 g Oral Daily  . risperiDONE  9 mg Oral QHS  . senna-docusate  1 tablet Oral BID  . thiamine  100 mg Oral Daily  . topiramate  250 mg Oral  QHS  . traZODone  300 mg Oral QHS  . warfarin  2 mg Oral ONCE-1800  . Warfarin - Pharmacist Dosing Inpatient   Does not apply q1800   Claudia Desanctis 01/07/2019

## 2019-01-07 NOTE — Progress Notes (Signed)
PROGRESS NOTE    James [name]  DPO:[id number] DOB: 1976/07/13 DOA: 12/19/2018 PCP: No primary care provider on file.  Brief Narrative: 43 year old male with history of hypertension, diabetes mellitus type 2, bipolar disorder presented on 12/17/2018 to [facility] after being found down for 2 days following binge alcohol drinking. He was found to have AKI, metabolic acidosis, leukocytosis and LFT elevation with rhabdomyolysis, CK of 58,000. He was treated with aggressive IV fluids but unfortunately his renal function got worse with decreased urine output and CK rose to 130,000. No improvement with IV diuresis. He was transferred to [facility] on 12/19/2018. Nephrology was consulted. Tunneled HD catheter was inserted and hemodialysis was started on 12/20/2018. HD catheter subsequently removed 12/27/2018. Patient also noted to have a MSSA bacteremia.  Assessment & Plan:   Principal Problem:   MSSA bacteremia Active Problems:   Diabetes mellitus ([place])   Bipolar affective disorder ([place])   ARF (acute renal failure) ([place])   Alcoholism ([place])   AKI (acute kidney injury) ([place])   Hemodialysis catheter infection ([place])   Traumatic rhabdomyolysis ([place])   Thrombosis of right internal jugular vein ([place])  #1. Acute renal failure likely secondary to rhabdomyolysis/ATN/polysubstance abuse. Patient started on hemodialysis via a tunneled hemodialysis catheter on 12/20/2018. Renal function still not showing much improvement post hemodialysis. Nephrology following. Patient being evaluated by vascular surgery for vascular access placement. S/p vascular mapping.  For possible AV fistula placement next week.   #2. Volume overload. Likely secondary to acute renal failure. Responsive to hemodialysis. Patient clinically asymptomatic. C/w HD per nephrology  #3. Bipolar disorder with anxiety. Continue with SSRI, Risperdal, Topamax and trazodone.  #4. Right internal jugular vein  thrombosis. Currently on heparin drip to bridge with warfarin. Continue to monitor INR daily Willplan to discharge with INR of 2-3.  #5. MSSA bacteremia. Currently on cefazolin. Status post removal of catheter. Repeat blood culture showed no growth.till date  #6. Polysubstance abuse/Alcoholism. Patient has no symptoms of alcohol withdrawal. Initially on Librium which has been discontinued.  #7. Diabetes mellitus type 2. Hemoglobin A1c 5.2 Continue with sliding scale insulin Fingersticks AC at bedtime Hypoglycemic protocol  #8. Detectable troponin likely secondary to acute kidney injury from rhabdomyolysis/ATN. Patient denies any chest pain. No intervention required.  #9. Hypertension. Controlled with Lopressor and Norvasc. Continue with current medication. Monitor blood pressure     DVT prophylaxis: Heparin gtt   Code Status: Full code   Family Communication:   Disposition Plan: Patient scheduled for AV fistula placement possibly Tuesday or Wednesday by vascular surgery prior to discharge.  Consultants:   Vascular surgery  Nephrology   Procedures: Right IJ PermCath placement   Antimicrobials: Cefazolin with hemodialysis 3 times a week   Subjective: Patient was evaluated at bedside.  Denies any complaints today.  He status post hemodialysis today.  Patient has agreed with vascular surgery for replacement of AV fistula and is status post vascular mapping today.  Surgery tentatively for Monday Tuesday next week.  Nephrology confirmed availability of outpatient hemodialysis chair.  Objective: Vitals:   01/07/19 1000 01/07/19 1030 01/07/19 1057 01/07/19 1123  BP: (!) 177/98 (!) 172/97 (!) 172/97 (!) 160/92  Pulse: (!) 51 (!) 48 (!) 51 (!) 52  Resp:   16 16  Temp:   97.8 F (36.6 C) 98 F (36.7 C)  TempSrc:   Oral   SpO2:   96% 95%  Weight:   113.3 kg   Height:  Intake/Output Summary (Last 24 hours) at 01/07/2019 1323 Last data filed  at 01/07/2019 1057 Gross per 24 hour  Intake 720 ml  Output 2550 ml  Net -1830 ml   Filed Weights   01/07/19 0507 01/07/19 0721 01/07/19 1057  Weight: 115 kg 114.4 kg 113.3 kg    Examination: General exam: Appears calm and comfortable  Respiratory system: Clear to auscultation. Respiratory effort normal. Cardiovascular system: S1 & S2 heard, RRR. No JVD, murmurs, rubs, gallops or clicks. No pedal edema. Gastrointestinal system: Abdomen is nondistended, soft and nontender. No organomegaly or masses felt. Normal bowel sounds heard. Central nervous system: Alert and oriented. No focal neurological deficits. Extremities: 1 + pitting pedal edema. Symmetric 5 x 5 power. Skin: No rashes, lesions or ulcers Psychiatry: Judgement and insight appear normal. Mood & affect appropriate.     Data Reviewed: I have personally reviewed following labs and imaging studies  CBC: Recent Labs  Lab 01/01/19 0421  01/03/19 0238 01/04/19 0313 01/05/19 0311 01/06/19 0215 01/07/19 0225  WBC 6.9   < > 7.4 6.7 6.4 6.5 6.0  NEUTROABS 4.4  --   --   --   --   --   --   HGB 8.3*   < > 8.4* 8.0* 7.9* 8.3* 8.2*  HCT 24.9*   < > 26.0* 23.8* 23.6* 25.4* 25.5*  MCV 94.0   < > 92.5 92.2 92.9 93.7 94.1  PLT 362   < > 343 324 307 309 314   < > = values in this interval not displayed.   Basic Metabolic Panel: Recent Labs  Lab 01/03/19 0238 01/04/19 0313 01/05/19 0311 01/06/19 0215 01/07/19 0225  NA 131* 132* 133* 134* 134*  K 5.2* 4.5 4.8 4.4 4.2  CL 94* 96* 97* 99 98  CO2 25 25 25 26 24   GLUCOSE 83 78 77 75 78  BUN 44* 30* 42* 28* 36*  CREATININE 7.14* 5.78* 6.99* 5.38* 6.63*  CALCIUM 8.5* 8.5* 8.6* 8.5* 8.6*  PHOS 6.1* 4.8* 6.0* 4.8* 5.7*   GFR: Estimated Creatinine Clearance: 19.1 mL/min (A) (by C-G formula based on SCr of 6.63 mg/dL (H)). Liver Function Tests: Recent Labs  Lab 01/03/19 0238 01/04/19 0313 01/05/19 0311 01/06/19 0215 01/07/19 0225  ALBUMIN 2.4* 2.3* 2.4* 2.5* 2.5*   No  results for input(s): LIPASE, AMYLASE in the last 168 hours. No results for input(s): AMMONIA in the last 168 hours. Coagulation Profile: Recent Labs  Lab 01/04/19 1552 01/05/19 1158 01/06/19 0215 01/07/19 0225  INR 1.1 1.3* 2.2* 2.9*   Cardiac Enzymes: No results for input(s): CKTOTAL, CKMB, CKMBINDEX, TROPONINI in the last 168 hours. BNP (last 3 results) No results for input(s): PROBNP in the last 8760 hours. HbA1C: No results for input(s): HGBA1C in the last 72 hours. CBG: Recent Labs  Lab 12/31/18 2231 01/02/19 2150 01/02/19 2215  GLUCAP 110* 75 76   Lipid Profile: No results for input(s): CHOL, HDL, LDLCALC, TRIG, CHOLHDL, LDLDIRECT in the last 72 hours. Thyroid Function Tests: No results for input(s): TSH, T4TOTAL, FREET4, T3FREE, THYROIDAB in the last 72 hours. Anemia Panel: No results for input(s): VITAMINB12, FOLATE, FERRITIN, TIBC, IRON, RETICCTPCT in the last 72 hours. Sepsis Labs: No results for input(s): PROCALCITON, LATICACIDVEN in the last 168 hours.  No results found for this or any previous visit (from the past 240 hour(s)).       Radiology Studies: Vas [place] Upper Ext Vein Mapping (pre-op Avf)  Result Date: 01/07/2019 UPPER EXTREMITY VEIN MAPPING Performing  Technologist: Abram Sander RVS  Examination Guidelines: A complete evaluation includes B-mode imaging, spectral Doppler, color Doppler, and power Doppler as needed of all accessible portions of each vessel. Bilateral testing is considered an integral part of a complete examination. Limited examinations for reoccurring indications may be performed as noted. +-----------------+-------------+----------+--------------+  Right Cephalic    Diameter (cm) Depth (cm)    Findings     +-----------------+-------------+----------+--------------+  Shoulder              0.11         0.96                    +-----------------+-------------+----------+--------------+  Prox upper arm        0.25         0.97                     +-----------------+-------------+----------+--------------+  Mid upper arm         0.27         0.64                    +-----------------+-------------+----------+--------------+  Dist upper arm        0.32         0.66                    +-----------------+-------------+----------+--------------+  Antecubital fossa     0.41         0.36                    +-----------------+-------------+----------+--------------+  Prox forearm          0.29         0.54                    +-----------------+-------------+----------+--------------+  Mid forearm           0.25         0.52      branching     +-----------------+-------------+----------+--------------+  Dist forearm                               not visualized  +-----------------+-------------+----------+--------------+  Wrist                                      not visualized  +-----------------+-------------+----------+--------------+ +-----------------+-------------+----------+--------------+  Right Basilic     Diameter (cm) Depth (cm)    Findings     +-----------------+-------------+----------+--------------+  Prox upper arm        0.48         1.41                    +-----------------+-------------+----------+--------------+  Mid upper arm         0.56         1.48                    +-----------------+-------------+----------+--------------+  Dist upper arm        0.52         1.27                    +-----------------+-------------+----------+--------------+  Antecubital fossa     0.42         0.51  branching     +-----------------+-------------+----------+--------------+  Prox forearm                               not visualized  +-----------------+-------------+----------+--------------+  Mid forearm                                not visualized  +-----------------+-------------+----------+--------------+  Distal forearm                             not visualized  +-----------------+-------------+----------+--------------+  Elbow                                       not visualized  +-----------------+-------------+----------+--------------+  Wrist                                      not visualized  +-----------------+-------------+----------+--------------+ +-----------------+-------------+----------+---------+  Left Cephalic     Diameter (cm) Depth (cm) Findings   +-----------------+-------------+----------+---------+  Shoulder              0.25         0.78               +-----------------+-------------+----------+---------+  Prox upper arm        0.28         0.71               +-----------------+-------------+----------+---------+  Mid upper arm         0.28         0.48               +-----------------+-------------+----------+---------+  Dist upper arm        0.33         0.49               +-----------------+-------------+----------+---------+  Antecubital fossa     0.32         0.47               +-----------------+-------------+----------+---------+  Prox forearm          0.36         0.71               +-----------------+-------------+----------+---------+  Mid forearm           0.25         0.52    branching  +-----------------+-------------+----------+---------+  Dist forearm          0.22         0.30               +-----------------+-------------+----------+---------+  Wrist                 0.17         0.26               +-----------------+-------------+----------+---------+ +-----------------+-------------+----------+--------------+  Left Basilic      Diameter (cm) Depth (cm)    Findings     +-----------------+-------------+----------+--------------+  Prox upper arm        0.29         1.09                    +-----------------+-------------+----------+--------------+  Mid upper arm         0.32         0.99                    +-----------------+-------------+----------+--------------+  Dist upper arm        0.22         0.81                    +-----------------+-------------+----------+--------------+  Antecubital fossa     0.23         0.51       branching     +-----------------+-------------+----------+--------------+  Prox forearm                               not visualized  +-----------------+-------------+----------+--------------+  Mid forearm                                not visualized  +-----------------+-------------+----------+--------------+  Distal forearm                             not visualized  +-----------------+-------------+----------+--------------+  Elbow                                      not visualized  +-----------------+-------------+----------+--------------+  Wrist                                      not visualized  +-----------------+-------------+----------+--------------+ *See table(s) above for measurements and observations.  Diagnosing physician:    Preliminary         Scheduled Meds:  amLODipine  10 mg Oral Daily   Chlorhexidine Gluconate Cloth  6 each Topical Q0600   darbepoetin (ARANESP) injection - DIALYSIS  40 mcg Intravenous Q Thu-HD   feeding supplement (ENSURE ENLIVE)  237 mL Oral BID BM   FLUoxetine  40 mg Oral Daily   folic acid  1 mg Oral Daily   loxapine  25 mg Oral Daily   loxapine  50 mg Oral QHS   polyethylene glycol  17 g Oral Daily   risperiDONE  9 mg Oral QHS   senna-docusate  1 tablet Oral BID   thiamine  100 mg Oral Daily   topiramate  250 mg Oral QHS   traZODone  300 mg Oral QHS   Continuous Infusions:  sodium chloride     sodium chloride     sodium chloride Stopped (12/26/18 0534)    ceFAZolin (ANCEF) IV 2 g (01/05/19 1731)   heparin 2,300 Units/hr (01/07/19 0237)     LOS: 19 days      Elie Confer, MD [facility] Pager [phone]  If 7PM-7AM, please contact night-coverage [email or web address] Password [email or web address] 01/07/2019, 1:23 PM

## 2019-01-07 NOTE — Progress Notes (Signed)
Upper extremity vein mapping has been completed.   Preliminary results in CV Proc.   James [name] 01/07/2019 12:36 PM

## 2019-01-07 NOTE — Progress Notes (Signed)
[facility] [place]  Subjective  - No complaints.  Seen in dialysis.  Amendable to stay for fistula now.     Objective (!) 175/105 (!) 52 (!) 97.5 F (36.4 C) (Oral) 16 96%  Intake/Output Summary (Last 24 hours) at 01/07/2019 1000 Last data filed at 01/07/2019 0634 Gross per 24 hour  Intake 760 ml  Output 1550 ml  Net -790 ml   Symmetric radial pulses  Laboratory Lab Results: Recent Labs    01/06/19 0215 01/07/19 0225  WBC 6.5 6.0  HGB 8.3* 8.2*  HCT 25.4* 25.5*  PLT 309 314   BMET Recent Labs    01/06/19 0215 01/07/19 0225  NA 134* 134*  K 4.4 4.2  CL 99 98  CO2 26 24  GLUCOSE 75 78  BUN 28* 36*  CREATININE 5.38* 6.63*  CALCIUM 8.5* 8.6*    COAG Lab Results  Component Value Date   INR 2.9 (H) 01/07/2019   INR 2.2 (H) 01/06/2019   INR 1.3 (H) 01/05/2019   No results found for: PTT  Assessment/Planning:  Patient now amendable to stay for AVF placement.  Will need to hold coumadin, INR 2.9 today.  Heparin bridge ok if needed.  Vein mapping today.  Plan for early week pending OR availability.  Discussed could be Tues Wed if no OR time Monday.    Marty Heck 01/07/2019 10:00 AM --

## 2019-01-07 NOTE — Progress Notes (Signed)
Physical Therapy Treatment & Discharge Patient Details Name: James [name] MRN: [id number] DOB: 08/31/1976 Today's Date: 01/07/2019    History of Present Illness Pt is a 43 y/o male admitted after fall. Found to have acute renal failure secondary to rhabdomyolysis. Pt is s/p tunneled HD catheter and started HD on 4/7. PMH includes DM, bipolar disorder, HTN, and alcohol abuse.    PT Comments    Pt has progressed well with mobility. Ambulating and performing ADLs independently without overt instability. Scored 20/24 on Dynamic Gait Index which does not indicate fall risk with higher level balance activities. Pt aware he will remain admitted at least a few more days. Educ on importance of continued ambulation during hospital admission; pt understands. Pt has met short-term acute PT goals and has no further questions or concerns. Mother will be able to provide transportation home. Will d/c acute PT.   Follow Up Recommendations  No PT follow up;Supervision - Intermittent     Equipment Recommendations  None recommended by PT    Recommendations for Other Services       Precautions / Restrictions Precautions Precautions: None Restrictions Weight Bearing Restrictions: No    Mobility  Bed Mobility Overal bed mobility: Independent                Transfers Overall transfer level: Independent Equipment used: None Transfers: Sit to/from Stand              Ambulation/Gait Ambulation/Gait assistance: Independent Social research officer, government (Feet): 600 Feet Assistive device: None;IV Pole Gait Pattern/deviations: Step-through pattern;Decreased stride length   Gait velocity interpretation: 1.31 - 2.62 ft/sec, indicative of limited community ambulator General Gait Details: Indep ambulation in hallway pushing IV pole and without any UE support. No overt LOB or instability with higher level balance activities   Stairs Stairs: (Pt declined; reports comfortable ascending flight of  steps to bedroom with rail support)           Wheelchair Mobility    Modified Rankin (Stroke Patients Only)       Balance Overall balance assessment: Needs assistance Sitting-balance support: No upper extremity supported;Feet supported Sitting balance-Leahy Scale: Good       Standing balance-Leahy Scale: Good                   Standardized Balance Assessment Standardized Balance Assessment : Dynamic Gait Index   Dynamic Gait Index Level Surface: Normal Change in Gait Speed: Normal Gait with Horizontal Head Turns: Mild Impairment Gait with Vertical Head Turns: Mild Impairment Gait and Pivot Turn: Normal Step Over Obstacle: Mild Impairment Step Around Obstacles: Normal Steps: Mild Impairment Total Score: 20      Cognition Arousal/Alertness: Awake/alert Behavior During Therapy: WFL for tasks assessed/performed Overall Cognitive Status: Within Functional Limits for tasks assessed                                        Exercises      General Comments        Pertinent Vitals/Pain Pain Assessment: No/denies pain    Home Living                      Prior Function            PT Goals (current goals can now be found in the care plan section) Acute Rehab PT Goals PT Goal Formulation: All assessment and education  complete, DC therapy Progress towards PT goals: Goals met/education completed, patient discharged from PT    Frequency    Min 3X/week      PT Plan Discharge plan needs to be updated    Co-evaluation              AM-PAC PT "6 Clicks" Mobility   Outcome Measure  Help needed turning from your back to your side while in a flat bed without using bedrails?: None Help needed moving from lying on your back to sitting on the side of a flat bed without using bedrails?: None Help needed moving to and from a bed to a chair (including a wheelchair)?: None Help needed standing up from a chair using your arms  (e.g., wheelchair or bedside chair)?: None Help needed to walk in hospital room?: None Help needed climbing 3-5 steps with a railing? : A Little 6 Click Score: 23    End of Session   Activity Tolerance: Patient tolerated treatment well Patient left: in bed;with call bell/phone within reach Nurse Communication: Mobility status PT Visit Diagnosis: Other abnormalities of gait and mobility (R26.89);Unsteadiness on feet (R26.81);Muscle weakness (generalized) (M62.81)     Time: 4580-9983 PT Time Calculation (min) (ACUTE ONLY): 18 min  Charges:  $Gait Training: 8-22 mins                    Mabeline Caras, PT, DPT Acute Rehabilitation Services  Pager [phone] Office [place] 01/07/2019, 4:42 PM

## 2019-01-08 LAB — HEPARIN LEVEL (UNFRACTIONATED): Heparin Unfractionated: 0.46 IU/mL (ref 0.30–0.70)

## 2019-01-08 LAB — PROTIME-INR
INR: 1.9 — ABNORMAL HIGH (ref 0.8–1.2)
Prothrombin Time: 21.7 seconds — ABNORMAL HIGH (ref 11.4–15.2)

## 2019-01-08 LAB — CBC
HCT: 25.7 % — ABNORMAL LOW (ref 39.0–52.0)
Hemoglobin: 8.4 g/dL — ABNORMAL LOW (ref 13.0–17.0)
MCH: 30.8 pg (ref 26.0–34.0)
MCHC: 32.7 g/dL (ref 30.0–36.0)
MCV: 94.1 fL (ref 80.0–100.0)
Platelets: 284 10*3/uL (ref 150–400)
RBC: 2.73 MIL/uL — ABNORMAL LOW (ref 4.22–5.81)
RDW: 12.8 % (ref 11.5–15.5)
WBC: 6.7 10*3/uL (ref 4.0–10.5)
nRBC: 0 % (ref 0.0–0.2)

## 2019-01-08 LAB — RENAL FUNCTION PANEL
Albumin: 2.6 g/dL — ABNORMAL LOW (ref 3.5–5.0)
Anion gap: 12 (ref 5–15)
BUN: 23 mg/dL — ABNORMAL HIGH (ref 6–20)
CO2: 26 mmol/L (ref 22–32)
Calcium: 8.7 mg/dL — ABNORMAL LOW (ref 8.9–10.3)
Chloride: 99 mmol/L (ref 98–111)
Creatinine, Ser: 4.74 mg/dL — ABNORMAL HIGH (ref 0.61–1.24)
GFR calc Af Amer: 16 mL/min — ABNORMAL LOW (ref >60)
GFR calc non Af Amer: 14 mL/min — ABNORMAL LOW (ref >60)
Glucose, Bld: 78 mg/dL (ref 70–99)
Phosphorus: 4.7 mg/dL — ABNORMAL HIGH (ref 2.5–4.6)
Potassium: 4 mmol/L (ref 3.5–5.1)
Sodium: 137 mmol/L (ref 135–145)

## 2019-01-08 NOTE — Progress Notes (Signed)
ANTICOAGULATION CONSULT NOTE - Follow Up Consult  Pharmacy Consult for Heparin Indication: DVT (IJ thrombus)  No Known Allergies  Patient Measurements: Height: 6\' 3"  (190.5 cm) Weight: 251 lb 15.8 oz (114.3 kg) IBW/kg (Calculated) : 84.5 Heparin Dosing Weight: 110 kg  Vital Signs: Temp: 98.2 F (36.8 C) (04/26 0510) Temp Source: Oral (04/26 0510) BP: 155/93 (04/26 0510) Pulse Rate: 65 (04/26 0510)  Labs: Recent Labs    01/06/19 0215 01/07/19 0225 01/08/19 0246  HGB 8.3* 8.2* 8.4*  HCT 25.4* 25.5* 25.7*  PLT 309 314 284  LABPROT 24.1* 30.1* 21.7*  INR 2.2* 2.9* 1.9*  HEPARINUNFRC 0.47 0.46 0.46  CREATININE 5.38* 6.63* 4.74*    Estimated Creatinine Clearance: 26.8 mL/min (A) (by C-G formula based on SCr of 4.74 mg/dL (H)).   Medications:  Scheduled:  . amLODipine  10 mg Oral Daily  . Chlorhexidine Gluconate Cloth  6 each Topical Q0600  . darbepoetin (ARANESP) injection - DIALYSIS  40 mcg Intravenous Q Thu-HD  . feeding supplement (ENSURE ENLIVE)  237 mL Oral BID BM  . FLUoxetine  40 mg Oral Daily  . folic acid  1 mg Oral Daily  . loxapine  25 mg Oral Daily  . loxapine  50 mg Oral QHS  . polyethylene glycol  17 g Oral Daily  . risperiDONE  9 mg Oral QHS  . senna-docusate  1 tablet Oral BID  . thiamine  100 mg Oral Daily  . topiramate  250 mg Oral QHS  . traZODone  300 mg Oral QHS   Infusions:  . sodium chloride    . sodium chloride    . sodium chloride Stopped (12/26/18 0534)  .  ceFAZolin (ANCEF) IV Stopped (01/07/19 1925)  . heparin 2,300 Units/hr (01/08/19 0123)    Assessment: [name] on heparin for IJ thrombus. Patient is now agreeable to AVF placement, so warfarin was stopped and heparin will be continued until surgery.  Heparin level remains therapeutic on 2300 units/hr. CBC is stable and no bleeding or infusion issues noted.  Goal of Therapy:  Heparin level 0.3-0.7 units/ml Monitor platelets by anticoagulation protocol: Yes   Plan:   Continue heparin at 2300 units/hr Monitor daily heparin level, CBC   Jackson Latino, PharmD PGY1 Pharmacy Resident Phone [phone] 01/08/2019     9:28 AM

## 2019-01-08 NOTE — Progress Notes (Signed)
Please restrict left arm for AVF placement.  Marty Heck, MD [facility] [place] Office: [phone] Pager: [place]

## 2019-01-08 NOTE — Progress Notes (Signed)
Patient ID: James [name], male   DOB: Oct 23, 1975, 43 y.o.   MRN: [id number] [facility] Progress Note   Assessment/ Plan:   #  Acute kidney Injury:  - 2/2 ATN. Hx rhabdo.  Recovery may be slow and protracted based on severity of injury.  First HD was 4/7.  Tunneled catheter placed 4/20 (IR) - Continues to be dialysis dependent  - Plans for AVF with vascular surgery tomorrow, 4/27 - HD is per TTS schedule.  Once he is ready for discharge, he has a spot at [facility] 2nd shift TTS at 11:45 with arrival 11:00 am first treatment   # Bradycardia - I have discontinued metoprolol   # HTN  - UF today with HD   #  Volume overload: optimize with HD  #  Bipolar disorder/anxiety: management per primary service.  #  History of alcohol abuse: initial ultrasound suggestive of early development of cirrhosis (suspect fatty liver has a contributory effect).   # Hyponatremia: Secondary to acute kidney injury and unrestricted oral fluid intake.  Fluid restrict and HD as above   # MSSA bacteremia: On intravenous cefazolin.  Status post catheter holiday as above. Last blood cultures NGTD   #  Right IJ thrombus: heparin gtt peri-operatively   # Anemia - secondary in part to AKI.  aranesp - first dose 4/23.  No acute indication for PRBC's    Subjective:   Feels well today.  Last HD on 4/25 with 1 kg UF.  They are planning to do his AVF tomorrow - vascular spoke with him again this AM.  He had 1.3 L UOP over 4/25 as well.  Review of systems:  Denies shortness of breath or chest pain No n/v    Objective:   BP (!) 155/93 (BP Location: Left Arm)   Pulse 65   Temp 98.2 F (36.8 C) (Oral)   Resp 18   Ht 6\' 3"  (1.905 m)   Wt 114.3 kg   SpO2 95%   BMI 31.50 kg/m   Intake/Output Summary (Last 24 hours) at 01/08/2019 1220 Last data filed at 01/08/2019 0800 Gross per 24 hour  Intake 600 ml  Output 1600 ml  Net -1000 ml   Weight change: -0.6 kg  Physical  Exam: Gen: Adult male lying in bed in no distress CVS: RRR; no rub Resp: Clear to auscultation and normal work of breathing Abd: Soft, obese, nontender Ext: 1+ lower extremity edema bilaterally Neuro: alert and oriented x 3 conversant and provides history GU no foley Access: right IJ tunneled catheter    Imaging: Vas [place] Upper Ext Vein Mapping (pre-op Avf)  Result Date: 01/07/2019 UPPER EXTREMITY VEIN MAPPING Performing Technologist: Abram Sander RVS  Examination Guidelines: A complete evaluation includes B-mode imaging, spectral Doppler, color Doppler, and power Doppler as needed of all accessible portions of each vessel. Bilateral testing is considered an integral part of a complete examination. Limited examinations for reoccurring indications may be performed as noted. +-----------------+-------------+----------+--------------+ Right Cephalic   Diameter (cm)Depth (cm)   Findings    +-----------------+-------------+----------+--------------+ Shoulder             0.11        0.96                  +-----------------+-------------+----------+--------------+ Prox upper arm       0.25        0.97                  +-----------------+-------------+----------+--------------+  Mid upper arm        0.27        0.64                  +-----------------+-------------+----------+--------------+ Dist upper arm       0.32        0.66                  +-----------------+-------------+----------+--------------+ Antecubital fossa    0.41        0.36                  +-----------------+-------------+----------+--------------+ Prox forearm         0.29        0.54                  +-----------------+-------------+----------+--------------+ Mid forearm          0.25        0.52     branching    +-----------------+-------------+----------+--------------+ Dist forearm                            not visualized +-----------------+-------------+----------+--------------+ Wrist                                    not visualized +-----------------+-------------+----------+--------------+ +-----------------+-------------+----------+--------------+ Right Basilic    Diameter (cm)Depth (cm)   Findings    +-----------------+-------------+----------+--------------+ Prox upper arm       0.48        1.41                  +-----------------+-------------+----------+--------------+ Mid upper arm        0.56        1.48                  +-----------------+-------------+----------+--------------+ Dist upper arm       0.52        1.27                  +-----------------+-------------+----------+--------------+ Antecubital fossa    0.42        0.51     branching    +-----------------+-------------+----------+--------------+ Prox forearm                            not visualized +-----------------+-------------+----------+--------------+ Mid forearm                             not visualized +-----------------+-------------+----------+--------------+ Distal forearm                          not visualized +-----------------+-------------+----------+--------------+ Elbow                                   not visualized +-----------------+-------------+----------+--------------+ Wrist                                   not visualized +-----------------+-------------+----------+--------------+ +-----------------+-------------+----------+---------+ Left Cephalic    Diameter (cm)Depth (cm)Findings  +-----------------+-------------+----------+---------+ Shoulder             0.25        0.78             +-----------------+-------------+----------+---------+  Prox upper arm       0.28        0.71             +-----------------+-------------+----------+---------+ Mid upper arm        0.28        0.48             +-----------------+-------------+----------+---------+ Dist upper arm       0.33        0.49              +-----------------+-------------+----------+---------+ Antecubital fossa    0.32        0.47             +-----------------+-------------+----------+---------+ Prox forearm         0.36        0.71             +-----------------+-------------+----------+---------+ Mid forearm          0.25        0.52   branching +-----------------+-------------+----------+---------+ Dist forearm         0.22        0.30             +-----------------+-------------+----------+---------+ Wrist                0.17        0.26             +-----------------+-------------+----------+---------+ +-----------------+-------------+----------+--------------+ Left Basilic     Diameter (cm)Depth (cm)   Findings    +-----------------+-------------+----------+--------------+ Prox upper arm       0.29        1.09                  +-----------------+-------------+----------+--------------+ Mid upper arm        0.32        0.99                  +-----------------+-------------+----------+--------------+ Dist upper arm       0.22        0.81                  +-----------------+-------------+----------+--------------+ Antecubital fossa    0.23        0.51     branching    +-----------------+-------------+----------+--------------+ Prox forearm                            not visualized +-----------------+-------------+----------+--------------+ Mid forearm                             not visualized +-----------------+-------------+----------+--------------+ Distal forearm                          not visualized +-----------------+-------------+----------+--------------+ Elbow                                   not visualized +-----------------+-------------+----------+--------------+ Wrist                                   not visualized +-----------------+-------------+----------+--------------+ *See table(s) above for measurements and observations.  Diagnosing physician:  Monica Martinez MD Electronically signed by Monica Martinez MD on 01/07/2019 at 10:29:56 PM.    Final  Labs: BMET Recent Labs  Lab 01/02/19 0214 01/03/19 0238 01/04/19 0313 01/05/19 0311 01/06/19 0215 01/07/19 0225 01/08/19 0246  NA 130* 131* 132* 133* 134* 134* 137  K 4.5 5.2* 4.5 4.8 4.4 4.2 4.0  CL 93* 94* 96* 97* 99 98 99  CO2 28 25 25 25 26 24 26   GLUCOSE 85 83 78 77 75 78 78  BUN 29* 44* 30* 42* 28* 36* 23*  CREATININE 5.81* 7.14* 5.78* 6.99* 5.38* 6.63* 4.74*  CALCIUM 8.2* 8.5* 8.5* 8.6* 8.5* 8.6* 8.7*  PHOS 4.9* 6.1* 4.8* 6.0* 4.8* 5.7* 4.7*   CBC Recent Labs  Lab 01/05/19 0311 01/06/19 0215 01/07/19 0225 01/08/19 0246  WBC 6.4 6.5 6.0 6.7  HGB 7.9* 8.3* 8.2* 8.4*  HCT 23.6* 25.4* 25.5* 25.7*  MCV 92.9 93.7 94.1 94.1  PLT 307 309 314 284    Medications:    . amLODipine  10 mg Oral Daily  . Chlorhexidine Gluconate Cloth  6 each Topical Q0600  . darbepoetin (ARANESP) injection - DIALYSIS  40 mcg Intravenous Q Thu-HD  . feeding supplement (ENSURE ENLIVE)  237 mL Oral BID BM  . FLUoxetine  40 mg Oral Daily  . folic acid  1 mg Oral Daily  . loxapine  25 mg Oral Daily  . loxapine  50 mg Oral QHS  . polyethylene glycol  17 g Oral Daily  . risperiDONE  9 mg Oral QHS  . senna-docusate  1 tablet Oral BID  . thiamine  100 mg Oral Daily  . topiramate  250 mg Oral QHS  . traZODone  300 mg Oral QHS   Claudia Desanctis 01/08/2019

## 2019-01-08 NOTE — Progress Notes (Signed)
[facility]  Subjective  - No complaints.  Amendable to stay for fistula now.     Objective (!) 155/93 65 98.2 F (36.8 C) (Oral) 18 95%  Intake/Output Summary (Last 24 hours) at 01/08/2019 0948 Last data filed at 01/08/2019 0800 Gross per 24 hour  Intake 600 ml  Output 2600 ml  Net -2000 ml   Symmetric radial pulses BUE TDC for dialysis needs  Laboratory Lab Results: Recent Labs    01/07/19 0225 01/08/19 0246  WBC 6.0 6.7  HGB 8.2* 8.4*  HCT 25.5* 25.7*  PLT 314 284   BMET Recent Labs    01/07/19 0225 01/08/19 0246  NA 134* 137  K 4.2 4.0  CL 98 99  CO2 24 26  GLUCOSE 78 78  BUN 36* 23*  CREATININE 6.63* 4.74*  CALCIUM 8.6* 8.7*    COAG Lab Results  Component Value Date   INR 1.9 (H) 01/08/2019   INR 2.9 (H) 01/07/2019   INR 2.2 (H) 01/06/2019   No results found for: PTT  Assessment/Planning:  Vein mapping shows nice cephalic vein in left arm.  Please restrict left arm.  Plan for left arm AVF placement tomorrow pending OR availability.  INR 1.9 today and drifting down.  Keep holding coumadin.  NPO after midnight.  Tentative plan with Dr. Donnetta Hutching tomorrow.  Marty Heck 01/08/2019 9:48 AM --

## 2019-01-08 NOTE — Progress Notes (Addendum)
PROGRESS NOTE    James [name]  CNO:[id number] DOB: [birth year] DOA: 12/19/2018 PCP: No primary care provider on file.     Brief Narrative:43 year old male with history of hypertension, diabetes mellitus type 2, bipolar disorder presented on 12/17/2018 to [facility] after being found down for 2 days following binge alcohol drinking. He was found to have AKI, metabolic acidosis, leukocytosis and LFT elevation with rhabdomyolysis, CK of 58,000. He was treated with aggressive IV fluids but unfortunately his renal function got worse with decreased urine output and CK rose to 130,000. No improvement with IV diuresis. He was transferred to [facility] on 12/19/2018. Nephrology was consulted. Tunneled HD catheter was inserted and hemodialysis was started on 12/20/2018. HD catheter subsequently removed 12/27/2018. Patient also noted to have a MSSA bacteremia.   Assessment & Plan:   Principal Problem:   MSSA bacteremia Active Problems:   Diabetes mellitus ([place])   Bipolar affective disorder ([place])   ARF (acute renal failure) ([place])   Alcoholism ([place])   AKI (acute kidney injury) ([place])   Hemodialysis catheter infection ([place])   Traumatic rhabdomyolysis ([place])   Thrombosis of right internal jugular vein ([place])   #1. Acute renal failure likely secondary to rhabdomyolysis/ATN/polysubstance abuse. Patient started on hemodialysis via a tunneled hemodialysis catheter on 12/20/2018. Renal function still not showing much improvement post hemodialysis. Nephrology following. Patient is being planned for  vascular access placement tomorrow.. S/p vascular mapping.  For possible AV fistula placement tomorrow morning.  Warfarin stopped and patient only on heparin gtt.   #2. Volume overload. Likely secondary to acute renal failure. Responsive to hemodialysis. Patient clinically asymptomatic. C/w HD per nephrology  #3. Bipolar disorder with anxiety. Continue with SSRI, Risperdal,  Topamax and trazodone.  #4. Right internal jugular vein thrombosis. Currently on heparin drip only as patient is scheduled for AVF placement Discharge plan pending successful AV graft  Restart coumadin and achieve therapeutic INR prior to discharge.  #5. MSSA bacteremia. Currently on cefazolin.  Repeat blood culture showed no growth.till date C/w cefazolin with HD per ID recc.  #6. Polysubstance abuse/Alcoholism. Patient has no symptoms of alcohol withdrawal. Initially on Librium which has been discontinued.  #7. Diabetes mellitus type 2. Hemoglobin A1c 5.2 Continue with sliding scale insulin Fingersticks AC at bedtime Hypoglycemic protocol  #8. Detectable troponin likely secondary to acute kidney injury from rhabdomyolysis/ATN. Patient denies any chest pain. No intervention required.  #9. Hypertension. Controlled with Lopressor and Norvasc. Continue with current medication. Monitor blood pressure     DVT prophylaxis: Heparin   Code Status: Full code   Family Communication:Patient already spoke with her mum and don't want me to call mum.    Disposition Plan:Possible d/c after AV graft placement.  Consultants:  Nephrology Vascular surgery   Procedures: Right IJ PermCath placement Antimicrobials:  Antimicrobials: Cefazolin with hemodialysis 3 times a week   Subjective: Patient was seen and evaluated at bedside.  Has no new complaints.  Agreed with proceeding with AV graft placement possibly tomorrow by vascular surgery.  Will plan for discharge status post AV graft placement.  Objective: Vitals:   01/07/19 2118 01/08/19 0510 01/08/19 0659 01/08/19 1446  BP: (!) 147/93 (!) 155/93  (!) 145/89  Pulse: (!) 58 65  60  Resp: 16 18  18   Temp: 98.5 F (36.9 C) 98.2 F (36.8 C)  98.1 F (36.7 C)  TempSrc: Oral Oral    SpO2: 96% 95%  96%  Weight:   114.3 kg   Height:  Intake/Output Summary (Last 24 hours) at 01/08/2019 1626 Last data  filed at 01/08/2019 1503 Gross per 24 hour  Intake 890 ml  Output 1725 ml  Net -835 ml   Filed Weights   01/07/19 0721 01/07/19 1057 01/08/19 0659  Weight: 114.4 kg 113.3 kg 114.3 kg    Examination: General exam:Appears calm and comfortable  Respiratory system: Clear to auscultation. Respiratory effort normal. Cardiovascular system:S1 &S2 heard, RRR. No JVD, murmurs, rubs, gallops or clicks. No pedal edema. Gastrointestinal system:Abdomen is nondistended, soft and nontender. No organomegaly or masses felt. Normal bowel sounds heard. Central nervous system:Alert and oriented. No focal neurological deficits. Extremities:1 + pitting pedal edema.Symmetric 5 x 5 power. Skin: Right IJ PermCath.  Clean dressing. No rashes, lesions or ulcers Psychiatry:Judgement and insight appear normal. Mood &affect     Data Reviewed: I have personally reviewed following labs and imaging studies  CBC: Recent Labs  Lab 01/04/19 0313 01/05/19 0311 01/06/19 0215 01/07/19 0225 01/08/19 0246  WBC 6.7 6.4 6.5 6.0 6.7  HGB 8.0* 7.9* 8.3* 8.2* 8.4*  HCT 23.8* 23.6* 25.4* 25.5* 25.7*  MCV 92.2 92.9 93.7 94.1 94.1  PLT 324 307 309 314 245   Basic Metabolic Panel: Recent Labs  Lab 01/04/19 0313 01/05/19 0311 01/06/19 0215 01/07/19 0225 01/08/19 0246  NA 132* 133* 134* 134* 137  K 4.5 4.8 4.4 4.2 4.0  CL 96* 97* 99 98 99  CO2 25 25 26 24 26   GLUCOSE 78 77 75 78 78  BUN 30* 42* 28* 36* 23*  CREATININE 5.78* 6.99* 5.38* 6.63* 4.74*  CALCIUM 8.5* 8.6* 8.5* 8.6* 8.7*  PHOS 4.8* 6.0* 4.8* 5.7* 4.7*   GFR: Estimated Creatinine Clearance: 26.8 mL/min (A) (by C-G formula based on SCr of 4.74 mg/dL (H)). Liver Function Tests: Recent Labs  Lab 01/04/19 0313 01/05/19 0311 01/06/19 0215 01/07/19 0225 01/08/19 0246  ALBUMIN 2.3* 2.4* 2.5* 2.5* 2.6*   No results for input(s): LIPASE, AMYLASE in the last 168 hours. No results for input(s): AMMONIA in the last 168 hours. Coagulation  Profile: Recent Labs  Lab 01/04/19 1552 01/05/19 1158 01/06/19 0215 01/07/19 0225 01/08/19 0246  INR 1.1 1.3* 2.2* 2.9* 1.9*   Cardiac Enzymes: No results for input(s): CKTOTAL, CKMB, CKMBINDEX, TROPONINI in the last 168 hours. BNP (last 3 results) No results for input(s): PROBNP in the last 8760 hours. HbA1C: No results for input(s): HGBA1C in the last 72 hours. CBG: Recent Labs  Lab 01/02/19 2150 01/02/19 2215  GLUCAP 75 76   Lipid Profile: No results for input(s): CHOL, HDL, LDLCALC, TRIG, CHOLHDL, LDLDIRECT in the last 72 hours. Thyroid Function Tests: No results for input(s): TSH, T4TOTAL, FREET4, T3FREE, THYROIDAB in the last 72 hours. Anemia Panel: No results for input(s): VITAMINB12, FOLATE, FERRITIN, TIBC, IRON, RETICCTPCT in the last 72 hours. Sepsis Labs: No results for input(s): PROCALCITON, LATICACIDVEN in the last 168 hours.  No results found for this or any previous visit (from the past 240 hour(s)).       Radiology Studies: Vas [place] Upper Ext Vein Mapping (pre-op Avf)  Result Date: 01/07/2019 UPPER EXTREMITY VEIN MAPPING Performing Technologist: Abram Sander RVS  Examination Guidelines: A complete evaluation includes B-mode imaging, spectral Doppler, color Doppler, and power Doppler as needed of all accessible portions of each vessel. Bilateral testing is considered an integral part of a complete examination. Limited examinations for reoccurring indications may be performed as noted. +-----------------+-------------+----------+--------------+  Right Cephalic    Diameter (cm) Depth (cm)  Findings     +-----------------+-------------+----------+--------------+  Shoulder              0.11         0.96                    +-----------------+-------------+----------+--------------+  Prox upper arm        0.25         0.97                    +-----------------+-------------+----------+--------------+  Mid upper arm         0.27         0.64                     +-----------------+-------------+----------+--------------+  Dist upper arm        0.32         0.66                    +-----------------+-------------+----------+--------------+  Antecubital fossa     0.41         0.36                    +-----------------+-------------+----------+--------------+  Prox forearm          0.29         0.54                    +-----------------+-------------+----------+--------------+  Mid forearm           0.25         0.52      branching     +-----------------+-------------+----------+--------------+  Dist forearm                               not visualized  +-----------------+-------------+----------+--------------+  Wrist                                      not visualized  +-----------------+-------------+----------+--------------+ +-----------------+-------------+----------+--------------+  Right Basilic     Diameter (cm) Depth (cm)    Findings     +-----------------+-------------+----------+--------------+  Prox upper arm        0.48         1.41                    +-----------------+-------------+----------+--------------+  Mid upper arm         0.56         1.48                    +-----------------+-------------+----------+--------------+  Dist upper arm        0.52         1.27                    +-----------------+-------------+----------+--------------+  Antecubital fossa     0.42         0.51      branching     +-----------------+-------------+----------+--------------+  Prox forearm                               not visualized  +-----------------+-------------+----------+--------------+  Mid forearm  not visualized  +-----------------+-------------+----------+--------------+  Distal forearm                             not visualized  +-----------------+-------------+----------+--------------+  Elbow                                      not visualized  +-----------------+-------------+----------+--------------+  Wrist                                       not visualized  +-----------------+-------------+----------+--------------+ +-----------------+-------------+----------+---------+  Left Cephalic     Diameter (cm) Depth (cm) Findings   +-----------------+-------------+----------+---------+  Shoulder              0.25         0.78               +-----------------+-------------+----------+---------+  Prox upper arm        0.28         0.71               +-----------------+-------------+----------+---------+  Mid upper arm         0.28         0.48               +-----------------+-------------+----------+---------+  Dist upper arm        0.33         0.49               +-----------------+-------------+----------+---------+  Antecubital fossa     0.32         0.47               +-----------------+-------------+----------+---------+  Prox forearm          0.36         0.71               +-----------------+-------------+----------+---------+  Mid forearm           0.25         0.52    branching  +-----------------+-------------+----------+---------+  Dist forearm          0.22         0.30               +-----------------+-------------+----------+---------+  Wrist                 0.17         0.26               +-----------------+-------------+----------+---------+ +-----------------+-------------+----------+--------------+  Left Basilic      Diameter (cm) Depth (cm)    Findings     +-----------------+-------------+----------+--------------+  Prox upper arm        0.29         1.09                    +-----------------+-------------+----------+--------------+  Mid upper arm         0.32         0.99                    +-----------------+-------------+----------+--------------+  Dist upper arm        0.22         0.81                    +-----------------+-------------+----------+--------------+  Antecubital fossa     0.23         0.51      branching     +-----------------+-------------+----------+--------------+  Prox forearm                                not visualized  +-----------------+-------------+----------+--------------+  Mid forearm                                not visualized  +-----------------+-------------+----------+--------------+  Distal forearm                             not visualized  +-----------------+-------------+----------+--------------+  Elbow                                      not visualized  +-----------------+-------------+----------+--------------+  Wrist                                      not visualized  +-----------------+-------------+----------+--------------+ *See table(s) above for measurements and observations.  Diagnosing physician: Monica Martinez MD Electronically signed by Monica Martinez MD on 01/07/2019 at 10:29:56 PM.    Final         Scheduled Meds:  amLODipine  10 mg Oral Daily   Chlorhexidine Gluconate Cloth  6 each Topical Q0600   darbepoetin (ARANESP) injection - DIALYSIS  40 mcg Intravenous Q Thu-HD   feeding supplement (ENSURE ENLIVE)  237 mL Oral BID BM   FLUoxetine  40 mg Oral Daily   folic acid  1 mg Oral Daily   loxapine  25 mg Oral Daily   loxapine  50 mg Oral QHS   polyethylene glycol  17 g Oral Daily   risperiDONE  9 mg Oral QHS   senna-docusate  1 tablet Oral BID   thiamine  100 mg Oral Daily   topiramate  250 mg Oral QHS   traZODone  300 mg Oral QHS   Continuous Infusions:  sodium chloride     sodium chloride     sodium chloride Stopped (12/26/18 0534)    ceFAZolin (ANCEF) IV Stopped (01/07/19 1925)   heparin 2,300 Units/hr (01/08/19 1120)     LOS: 20 days      Elie Confer, MD [facility] Pager [phone]  If 7PM-7AM, please contact night-coverage [email or web address] Password [facility] 01/08/2019, 4:26 PM

## 2019-01-08 NOTE — H&P (View-Only) (Signed)
[facility]  Subjective  - No complaints.  Amendable to stay for fistula now.     Objective (!) 155/93 65 98.2 F (36.8 C) (Oral) 18 95%  Intake/Output Summary (Last 24 hours) at 01/08/2019 0948 Last data filed at 01/08/2019 0800 Gross per 24 hour  Intake 600 ml  Output 2600 ml  Net -2000 ml   Symmetric radial pulses BUE TDC for dialysis needs  Laboratory Lab Results: Recent Labs    01/07/19 0225 01/08/19 0246  WBC 6.0 6.7  HGB 8.2* 8.4*  HCT 25.5* 25.7*  PLT 314 284   BMET Recent Labs    01/07/19 0225 01/08/19 0246  NA 134* 137  K 4.2 4.0  CL 98 99  CO2 24 26  GLUCOSE 78 78  BUN 36* 23*  CREATININE 6.63* 4.74*  CALCIUM 8.6* 8.7*    COAG Lab Results  Component Value Date   INR 1.9 (H) 01/08/2019   INR 2.9 (H) 01/07/2019   INR 2.2 (H) 01/06/2019   No results found for: PTT  Assessment/Planning:  Vein mapping shows nice cephalic vein in left arm.  Please restrict left arm.  Plan for left arm AVF placement tomorrow pending OR availability.  INR 1.9 today and drifting down.  Keep holding coumadin.  NPO after midnight.  Tentative plan with Dr. Donnetta Hutching tomorrow.  Marty Heck 01/08/2019 9:48 AM --

## 2019-01-09 ENCOUNTER — Encounter (HOSPITAL_COMMUNITY)
Admission: AD | Disposition: A | Discharge status: Home / Self Care | Payer: Self-pay | Source: Other Acute Inpatient Hospital | Attending: Internal Medicine

## 2019-01-09 ENCOUNTER — Inpatient Hospital Stay (HOSPITAL_COMMUNITY): Payer: Medicare HMO | Admitting: Anesthesiology

## 2019-01-09 ENCOUNTER — Encounter (HOSPITAL_COMMUNITY): Payer: Self-pay | Admitting: Anesthesiology

## 2019-01-09 DIAGNOSIS — Z992 Dependence on renal dialysis: Secondary | ICD-10-CM

## 2019-01-09 DIAGNOSIS — N186 End stage renal disease: Secondary | ICD-10-CM

## 2019-01-09 HISTORY — PX: AV FISTULA PLACEMENT: SHX1204

## 2019-01-09 LAB — CBC
HCT: 26.7 % — ABNORMAL LOW (ref 39.0–52.0)
Hemoglobin: 8.7 g/dL — ABNORMAL LOW (ref 13.0–17.0)
MCH: 31 pg (ref 26.0–34.0)
MCHC: 32.6 g/dL (ref 30.0–36.0)
MCV: 95 fL (ref 80.0–100.0)
Platelets: 292 10*3/uL (ref 150–400)
RBC: 2.81 MIL/uL — ABNORMAL LOW (ref 4.22–5.81)
RDW: 12.9 % (ref 11.5–15.5)
WBC: 5.7 10*3/uL (ref 4.0–10.5)
nRBC: 0 % (ref 0.0–0.2)

## 2019-01-09 LAB — COMPREHENSIVE METABOLIC PANEL
ALT: <5 U/L (ref 0–44)
AST: 19 U/L (ref 15–41)
Albumin: 2.7 g/dL — ABNORMAL LOW (ref 3.5–5.0)
Alkaline Phosphatase: 51 U/L (ref 38–126)
Anion gap: 10 (ref 5–15)
BUN: 30 mg/dL — ABNORMAL HIGH (ref 6–20)
CO2: 27 mmol/L (ref 22–32)
Calcium: 8.9 mg/dL (ref 8.9–10.3)
Chloride: 102 mmol/L (ref 98–111)
Creatinine, Ser: 5.39 mg/dL — ABNORMAL HIGH (ref 0.61–1.24)
GFR calc Af Amer: 14 mL/min — ABNORMAL LOW (ref >60)
GFR calc non Af Amer: 12 mL/min — ABNORMAL LOW (ref >60)
Glucose, Bld: 82 mg/dL (ref 70–99)
Potassium: 4.2 mmol/L (ref 3.5–5.1)
Sodium: 139 mmol/L (ref 135–145)
Total Bilirubin: 0.6 mg/dL (ref 0.3–1.2)
Total Protein: 5.7 g/dL — ABNORMAL LOW (ref 6.5–8.1)

## 2019-01-09 LAB — GLUCOSE, CAPILLARY: Glucose-Capillary: 71 mg/dL (ref 70–99)

## 2019-01-09 LAB — HEPARIN LEVEL (UNFRACTIONATED)
Heparin Unfractionated: 0.56 IU/mL (ref 0.30–0.70)
Heparin Unfractionated: 0.29 IU/mL — ABNORMAL LOW (ref 0.30–0.70)

## 2019-01-09 LAB — PROTIME-INR
INR: 1.6 — ABNORMAL HIGH (ref 0.8–1.2)
Prothrombin Time: 18.5 seconds — ABNORMAL HIGH (ref 11.4–15.2)

## 2019-01-09 SURGERY — INSERTION OF ARTERIOVENOUS (AV) GORE-TEX GRAFT ARM
Anesthesia: Monitor Anesthesia Care | Site: Arm Upper | Laterality: Left

## 2019-01-09 MED ORDER — FENTANYL CITRATE (PF) 100 MCG/2ML IJ SOLN
INTRAMUSCULAR | Status: DC | PRN
  Administered 2019-01-09: 50 ug via INTRAVENOUS

## 2019-01-09 MED ORDER — DEXAMETHASONE SODIUM PHOSPHATE 10 MG/ML IJ SOLN
INTRAMUSCULAR | Status: DC | PRN
  Administered 2019-01-09: 5 mg via INTRAVENOUS

## 2019-01-09 MED ORDER — 0.9 % SODIUM CHLORIDE (POUR BTL) OPTIME
TOPICAL | Status: DC | PRN
  Administered 2019-01-09: 11:00:00 1000 mL

## 2019-01-09 MED ORDER — ONDANSETRON HCL 4 MG/2ML IJ SOLN
INTRAMUSCULAR | Status: AC
  Filled 2019-01-09: qty 2

## 2019-01-09 MED ORDER — DEXAMETHASONE SODIUM PHOSPHATE 10 MG/ML IJ SOLN
INTRAMUSCULAR | Status: AC
  Filled 2019-01-09: qty 1

## 2019-01-09 MED ORDER — WARFARIN SODIUM 2.5 MG PO TABS
2.5000 mg | ORAL_TABLET | Freq: Once | ORAL | Status: AC
  Administered 2019-01-09: 2.5 mg via ORAL
  Filled 2019-01-09: qty 1

## 2019-01-09 MED ORDER — LIDOCAINE 2% (20 MG/ML) 5 ML SYRINGE
INTRAMUSCULAR | Status: AC
  Filled 2019-01-09: qty 5

## 2019-01-09 MED ORDER — MIDAZOLAM HCL 5 MG/5ML IJ SOLN
INTRAMUSCULAR | Status: DC | PRN
  Administered 2019-01-09: 2 mg via INTRAVENOUS

## 2019-01-09 MED ORDER — HEPARIN SODIUM (PORCINE) 1000 UNIT/ML DIALYSIS
40.0000 [IU]/kg | INTRAMUSCULAR | Status: DC | PRN
  Filled 2019-01-09: qty 5

## 2019-01-09 MED ORDER — LIDOCAINE-EPINEPHRINE 0.5 %-1:200000 IJ SOLN
INTRAMUSCULAR | Status: DC | PRN
  Administered 2019-01-09: 50 mL

## 2019-01-09 MED ORDER — ONDANSETRON HCL 4 MG/2ML IJ SOLN
INTRAMUSCULAR | Status: DC | PRN
  Administered 2019-01-09: 4 mg via INTRAVENOUS

## 2019-01-09 MED ORDER — LIDOCAINE-EPINEPHRINE 0.5 %-1:200000 IJ SOLN
INTRAMUSCULAR | Status: AC
  Filled 2019-01-09: qty 1

## 2019-01-09 MED ORDER — FENTANYL CITRATE (PF) 250 MCG/5ML IJ SOLN
INTRAMUSCULAR | Status: AC
  Filled 2019-01-09: qty 5

## 2019-01-09 MED ORDER — PROPOFOL 10 MG/ML IV BOLUS
INTRAVENOUS | Status: AC
  Filled 2019-01-09: qty 40

## 2019-01-09 MED ORDER — PROPOFOL 500 MG/50ML IV EMUL: INTRAVENOUS | Status: DC | PRN

## 2019-01-09 MED ORDER — MIDAZOLAM HCL 2 MG/2ML IJ SOLN
INTRAMUSCULAR | Status: AC
  Filled 2019-01-09: qty 2

## 2019-01-09 MED ORDER — PROPOFOL 500 MG/50ML IV EMUL
INTRAVENOUS | Status: DC | PRN
  Administered 2019-01-09: 50 ug/kg/min via INTRAVENOUS

## 2019-01-09 MED ORDER — SODIUM CHLORIDE 0.9 % IV SOLN
INTRAVENOUS | Status: AC
  Filled 2019-01-09: qty 1.2

## 2019-01-09 MED ORDER — CEFAZOLIN SODIUM-DEXTROSE 2-3 GM-%(50ML) IV SOLR
INTRAVENOUS | Status: DC | PRN
  Administered 2019-01-09: 2 g via INTRAVENOUS

## 2019-01-09 MED ORDER — SODIUM CHLORIDE 0.9 % IV SOLN
INTRAVENOUS | Status: DC | PRN
  Administered 2019-01-09: 500 mL

## 2019-01-09 MED ORDER — WARFARIN - PHARMACIST DOSING INPATIENT
Freq: Every day | Status: DC
  Administered 2019-01-11 – 2019-01-12 (×2)

## 2019-01-09 MED ORDER — DARBEPOETIN ALFA 100 MCG/0.5ML IJ SOSY
100.0000 ug | PREFILLED_SYRINGE | INTRAMUSCULAR | Status: DC
  Administered 2019-01-12: 12:00:00 100 ug via INTRAVENOUS
  Filled 2019-01-09: qty 0.5

## 2019-01-09 MED ORDER — HEPARIN (PORCINE) 25000 UT/250ML-% IV SOLN
2400.0000 [IU]/h | INTRAVENOUS | Status: DC
  Administered 2019-01-09: 2300 [IU]/h via INTRAVENOUS
  Administered 2019-01-10: 2500 [IU]/h via INTRAVENOUS
  Administered 2019-01-10 – 2019-01-15 (×11): 2400 [IU]/h via INTRAVENOUS
  Filled 2019-01-09 (×13): qty 250

## 2019-01-09 SURGICAL SUPPLY — 32 items
ADH SKN CLS APL DERMABOND .7 (GAUZE/BANDAGES/DRESSINGS) ×1
ARMBAND PINK RESTRICT EXTREMIT (MISCELLANEOUS) ×6 IMPLANT
BAG DECANTER FOR FLEXI CONT (MISCELLANEOUS) ×2 IMPLANT
CANISTER SUCT 3000ML PPV (MISCELLANEOUS) ×3 IMPLANT
CANNULA VESSEL 3MM 2 BLNT TIP (CANNULA) ×3 IMPLANT
CLIP LIGATING EXTRA MED SLVR (CLIP) ×3 IMPLANT
CLIP LIGATING EXTRA SM BLUE (MISCELLANEOUS) ×3 IMPLANT
COVER PROBE W GEL 5X96 (DRAPES) ×2 IMPLANT
COVER WAND RF STERILE (DRAPES) ×3 IMPLANT
DECANTER SPIKE VIAL GLASS SM (MISCELLANEOUS) ×5 IMPLANT
DERMABOND ADVANCED (GAUZE/BANDAGES/DRESSINGS) ×2
DERMABOND ADVANCED .7 DNX12 (GAUZE/BANDAGES/DRESSINGS) ×1 IMPLANT
ELECT REM PT RETURN 9FT ADLT (ELECTROSURGICAL) ×3
ELECTRODE REM PT RTRN 9FT ADLT (ELECTROSURGICAL) ×1 IMPLANT
GAUZE SPONGE 4X4 12PLY STRL (GAUZE/BANDAGES/DRESSINGS) ×3 IMPLANT
GLOVE SS BIOGEL STRL SZ 7.5 (GLOVE) ×1 IMPLANT
GLOVE SUPERSENSE BIOGEL SZ 7.5 (GLOVE) ×2
GOWN STRL REUS W/ TWL LRG LVL3 (GOWN DISPOSABLE) ×3 IMPLANT
GOWN STRL REUS W/TWL LRG LVL3 (GOWN DISPOSABLE) ×9
KIT BASIN OR (CUSTOM PROCEDURE TRAY) ×3 IMPLANT
KIT TURNOVER KIT B (KITS) ×3 IMPLANT
NS IRRIG 1000ML POUR BTL (IV SOLUTION) ×3 IMPLANT
PACK CV ACCESS (CUSTOM PROCEDURE TRAY) ×3 IMPLANT
PAD ARMBOARD 7.5X6 YLW CONV (MISCELLANEOUS) ×6 IMPLANT
SUT PROLENE 6 0 CC (SUTURE) ×6 IMPLANT
SUT SILK 2 0 PERMA HAND 18 BK (SUTURE) IMPLANT
SUT VIC AB 3-0 SH 27 (SUTURE) ×6
SUT VIC AB 3-0 SH 27X BRD (SUTURE) ×2 IMPLANT
SYR TOOMEY 50ML (SYRINGE) IMPLANT
TOWEL GREEN STERILE (TOWEL DISPOSABLE) ×3 IMPLANT
UNDERPAD 30X30 (UNDERPADS AND DIAPERS) ×3 IMPLANT
WATER STERILE IRR 1000ML POUR (IV SOLUTION) ×3 IMPLANT

## 2019-01-09 NOTE — Progress Notes (Signed)
Patient ID: James [name], male   DOB: 29-Oct-1975, 43 y.o.   MRN: [id number] [facility] Progress Note   Assessment/ Plan:   #  Prolonged dialysis dependence Acute kidney Injury:  - 2/2 ATN. Hx rhabdo.  Recovery may be slow and protracted based on severity of injury.  First HD was 4/7.  Tunneled catheter placed 4/20 (IR); s/p L RC AVF [facility] Early 4/27 - Continues to be dialysis dependent BUT UOP is excellent - HD is per TTS schedule.  Once he is ready for discharge, he has a spot at [facility] 2nd shift TTS at 11:45 with arrival 11:00 am first treatment   # Bradycardia HR in 50s CTM  # HTN: follow with UF/probing EDW  #  Volume overload: optimize with HD  #  Bipolar disorder/anxiety: management per primary service.  #  History of alcohol abuse: initial ultrasound suggestive of early development of cirrhosis (suspect fatty liver has a contributory effect).   # Hyponatremia: resolved  # MSSA bacteremia: On intravenous cefazolin.  Status post catheter holiday as above. Last blood cultures NGTD   #  Right IJ thrombus: heparin gtt peri-operatively, need to restart warfarin   # Anemia - secondary in part to AKI.  aranesp - first dose 4/23.  No acute indication for PRBC's; Fe levels with HD tomorrow    Subjective:    L RC AVF today  For HD tomorrow  On heparin/warfarin for IJ Thrombus, catheter related    Objective:   BP (!) 156/85 (BP Location: Right Arm)   Pulse (!) 54   Temp (!) 97.2 F (36.2 C)   Resp 15   Ht 6\' 3"  (1.905 m)   Wt 112.2 kg   SpO2 94%   BMI 30.92 kg/m   Intake/Output Summary (Last 24 hours) at 01/09/2019 1549 Last data filed at 01/09/2019 1522 Gross per 24 hour  Intake 1215 ml  Output 3835 ml  Net -2620 ml   Weight change: -2.2 kg  Physical Exam: Gen: Adult male lying in bed in no distress CVS: RRR; no rub Resp: Clear to auscultation and normal work of breathing Abd: Soft, obese, nontender Ext: 1+ lower  extremity edema bilaterally Neuro: alert and oriented x 3 conversant and provides history GU no foley Access: right IJ tunneled catheter; L RC AVF +B   Imaging: No results found.  Labs: BMET Recent Labs  Lab 01/03/19 0238 01/04/19 0313 01/05/19 0311 01/06/19 0215 01/07/19 0225 01/08/19 0246 01/09/19 0500  NA 131* 132* 133* 134* 134* 137 139  K 5.2* 4.5 4.8 4.4 4.2 4.0 4.2  CL 94* 96* 97* 99 98 99 102  CO2 25 25 25 26 24 26 27   GLUCOSE 83 78 77 75 78 78 82  BUN 44* 30* 42* 28* 36* 23* 30*  CREATININE 7.14* 5.78* 6.99* 5.38* 6.63* 4.74* 5.39*  CALCIUM 8.5* 8.5* 8.6* 8.5* 8.6* 8.7* 8.9  PHOS 6.1* 4.8* 6.0* 4.8* 5.7* 4.7*  --    CBC Recent Labs  Lab 01/06/19 0215 01/07/19 0225 01/08/19 0246 01/09/19 0500  WBC 6.5 6.0 6.7 5.7  HGB 8.3* 8.2* 8.4* 8.7*  HCT 25.4* 25.5* 25.7* 26.7*  MCV 93.7 94.1 94.1 95.0  PLT 309 314 284 292    Medications:    . amLODipine  10 mg Oral Daily  . Chlorhexidine Gluconate Cloth  6 each Topical Q0600  . darbepoetin (ARANESP) injection - DIALYSIS  40 mcg Intravenous Q Thu-HD  . feeding supplement (ENSURE ENLIVE)  237 mL Oral BID BM  . FLUoxetine  40 mg Oral Daily  . folic acid  1 mg Oral Daily  . loxapine  25 mg Oral Daily  . loxapine  50 mg Oral QHS  . polyethylene glycol  17 g Oral Daily  . risperiDONE  9 mg Oral QHS  . senna-docusate  1 tablet Oral BID  . thiamine  100 mg Oral Daily  . topiramate  250 mg Oral QHS  . traZODone  300 mg Oral QHS   Rexene Agent 01/09/2019

## 2019-01-09 NOTE — Interval H&P Note (Signed)
History and Physical Interval Note:  01/09/2019 10:12 AM  James [name]  has presented today for surgery, with the diagnosis of ESRD.  The various methods of treatment have been discussed with the patient and family. After consideration of risks, benefits and other options for treatment, the patient has consented to  Procedure(s): ARTERIOVENOUS (AV) FISTULA VS GORE-TEX GRAFT ARM (Left) as a surgical intervention.  The patient's history has been reviewed, patient examined, no change in status, stable for surgery.  I have reviewed the patient's chart and labs.  Questions were answered to the patient's satisfaction.     Curt Jews

## 2019-01-09 NOTE — Progress Notes (Signed)
ANTICOAGULATION CONSULT NOTE - Follow Up Consult  Pharmacy Consult for Heparin, Warfarin Indication: DVT (IJ thrombus)  No Known Allergies  Patient Measurements: Height: 6\' 3"  (190.5 cm) Weight: 247 lb 5.7 oz (112.2 kg) IBW/kg (Calculated) : 84.5 Heparin Dosing Weight: 110 kg  Vital Signs: Temp: 98 F (36.7 C) (04/27 2137) Temp Source: Oral (04/27 2137) BP: 155/90 (04/27 2137) Pulse Rate: 56 (04/27 2137)  Labs: Recent Labs    01/07/19 0225 01/08/19 0246 01/09/19 0500 01/09/19 2138  HGB 8.2* 8.4* 8.7*  --   HCT 25.5* 25.7* 26.7*  --   PLT 314 284 292  --   LABPROT 30.1* 21.7* 18.5*  --   INR 2.9* 1.9* 1.6*  --   HEPARINUNFRC 0.46 0.46 0.56 0.29*  CREATININE 6.63* 4.74* 5.39*  --     Estimated Creatinine Clearance: 23.4 mL/min (A) (by C-G formula based on SCr of 5.39 mg/dL (H)).  Assessment: [name] on heparin for IJ thrombus. S/p AVF placement with heparin bridge to coumadin.  Heparin level down to subtherapeutic (0.29) on 2300 units/hr. CBC is stable and no bleeding or infusion issues noted.  Goal of Therapy:  INR 2-3 Heparin level 0.3-0.7 units/ml Monitor platelets by anticoagulation protocol: Yes   Plan:  Increase heparin to 2500 units/hr Will f/u 8 hr heparin level  Sherlon Handing, PharmD, BCPS Clinical pharmacist  **Pharmacist phone directory can now be found on [email or web address] ([email or web address]).  Listed under [place]. 01/09/2019 11:11 PM

## 2019-01-09 NOTE — Plan of Care (Signed)
  Problem: Clinical Measurements: Goal: Respiratory complications will improve Outcome: Progressing   Problem: Clinical Measurements: Goal: Ability to maintain clinical measurements within normal limits will improve Outcome: Progressing   

## 2019-01-09 NOTE — TOC Progression Note (Signed)
Transition of Care [facility]) - Progression Note    Patient Details  Name: James [name] MRN: [id number] Date of Birth: December 02, 1975  Transition of [facility]) CM/SW Contact  Sherrilyn Rist Phone Number: [phone] 01/09/2019, 10:18 AM  Clinical Narrative:    01/09/2019 - CM following for progression of care; [place] arranged with [facility] [place] [place];   Expected Discharge Plan: [place] Barriers to Discharge: Continued Medical Work up  Expected Discharge Plan and Services Expected Discharge Plan: [place] In-house Referral: Clinical Social Work Discharge Planning Services: CM Consult Post Acute Care Choice: Home Health, Durable Medical Equipment Living arrangements for the past 2 months: Apartment                 DME Arranged: [place] [facility]         HH Arranged: PT, OT HH Agency: [place] ([facility])         Social Determinants of Health (SDOH) Interventions    Readmission Risk Interventions Readmission Risk Prevention Plan 01/04/2019  PCP or Specialist Appt within 3-5 Days Complete  Some recent data might be hidden

## 2019-01-09 NOTE — Op Note (Signed)
    OPERATIVE REPORT  DATE OF SURGERY: 01/09/2019  PATIENT: James [name], 43 y.o. male MRN: [id number]  DOB: 1975-11-04  PRE-OPERATIVE DIAGNOSIS: End-stage renal disease  POST-OPERATIVE DIAGNOSIS:  Same  PROCEDURE: Left radiocephalic AV fistula creation  SURGEON:  Curt Jews, M.D.  PHYSICIAN ASSISTANT: Liana Crocker, PA-C  ANESTHESIA: Local with sedation  EBL: per anesthesia record  Total I/O In: 500 [I.V.:500] Out: 600 [Urine:600]  BLOOD ADMINISTERED: none  DRAINS: none  SPECIMEN: none  COUNTS CORRECT:  YES  PATIENT DISPOSITION:  PACU - hemodynamically stable  PROCEDURE DETAILS: Patient was taken the operating place evaluation where the area of the left arm was prepped draped in usual sterile fashion.  SonoSite ultrasound was used to visualize cephalic vein which was of moderate size at the wrist and was patent throughout the forearm and also was moderate size at the antecubital space and proximally.  Patient had a normal radial and brachial pulse.  Using local anesthesia incision made from the level of the radial artery and cephalic vein at the wrist.  The vein was mobilized and was of excellent caliber.  Tributary branches were ligated with 3-0 silk ties and divided.  The vein was ligated distally with a 2-0 silk tie and was divided.  The vein was gently dilated with heparinized saline was of good caliber.  The radial artery was exposed through the same incision.  The artery was occluded proximally and distally with Serafin clamps was opened with an 11 blade and extended longitudinally with Potts scissors.  The vein was cut to the appropriate length and was spatulated and sewn end-to-side to the artery with a running 6-0 Prolene suture.  Clamps removed and excellent thrill was noted.  The wounds irrigated with saline.  Hemostasis-cautery.  Wounds were closed with 3-0 Vicryl in the subcutaneous and subcuticular tissue.  Sterile dressing was applied and the patient was  transferred to the recovery room in stable condition   Rosetta Posner, M.D., [facility] 01/09/2019 11:57 AM

## 2019-01-09 NOTE — Progress Notes (Signed)
[place] for Infectious Disease  Date of Admission:  12/19/2018     Total days of antibiotics 16         ASSESSMENT/PLAN  James [name] is a 43 y/o male with history of Type 2 diabetes, hypertension and bipolar disorder presenting to [facility] after being found down for 2 days. Transferred to [facility] with AKI, metabolic acidosis, leukocytosis and rhabdomyolysis. HD catheter inserted and complicated by MSSA bacteremia and Right IJ thrombus. TTE negative for endocarditis with most likely source being HD line.   MSSA bacteremia - Repeat blood cultures finalized without growth indicating clearance. Currently on Day 16 of antimicrobial therapy with Cefazolin which he is now receiving with dialysis. Most likely source is HD catheter which was replaced on 4/20. Given source control with removal of cathter and no endocarditis, will plan for 4 weeks of Ancef with dialysis.   Acute renal failure - Continues on hemodialysis via a tunneled catheter placed on 4/20 and AV fistula created today by vascular surgery. Working on establishing dialysis through nephrology.    Principal Problem:   MSSA bacteremia Active Problems:   Diabetes mellitus ([name])   Bipolar affective disorder ([name])   ARF (acute renal failure) ([name])   Alcoholism ([name])   AKI (acute kidney injury) ([name])   Hemodialysis catheter infection ([name])   Traumatic rhabdomyolysis ([name])   Thrombosis of right internal jugular vein ([name])   . amLODipine  10 mg Oral Daily  . Chlorhexidine Gluconate Cloth  6 each Topical Q0600  . [START ON 01/12/2019] darbepoetin (ARANESP) injection - DIALYSIS  100 mcg Intravenous Q Thu-HD  . feeding supplement (ENSURE ENLIVE)  237 mL Oral BID BM  . FLUoxetine  40 mg Oral Daily  . folic acid  1 mg Oral Daily  . loxapine  25 mg Oral Daily  . loxapine  50 mg Oral QHS  . polyethylene glycol  17 g Oral Daily  . risperiDONE  9 mg Oral QHS  . senna-docusate  1 tablet Oral BID  . thiamine  100 mg  Oral Daily  . topiramate  250 mg Oral QHS  . traZODone  300 mg Oral QHS    SUBJECTIVE:  Afebrile overnight with no acute events. Completed left AV fistula today. Denies fevers, chills, sweats or pain currently.   No Known Allergies   Review of Systems: Review of Systems  Constitutional: Negative for chills, fever and weight loss.  Respiratory: Negative for cough, shortness of breath and wheezing.   Cardiovascular: Negative for chest pain and leg swelling.  Gastrointestinal: Negative for abdominal pain, constipation, diarrhea, nausea and vomiting.  Skin: Negative for rash.      OBJECTIVE: Vitals:   01/09/19 0900 01/09/19 1202 01/09/19 1217 01/09/19 1232  BP:  (!) 143/89 (!) 155/88 (!) 156/85  Pulse:  (!) 56 (!) 55 (!) 54  Resp:  12 15 15   Temp: 98 F (36.7 C) (!) 97.3 F (36.3 C)  (!) 97.2 F (36.2 C)  TempSrc:      SpO2:  92% 96% 94%  Weight:      Height:       Body mass index is 30.92 kg/m.  Physical Exam Constitutional:      General: He is not in acute distress.    Appearance: He is well-developed.  Cardiovascular:     Rate and Rhythm: Normal rate and regular rhythm.     Heart sounds: Normal heart sounds.  Pulmonary:     Effort: Pulmonary effort is  normal.     Breath sounds: Normal breath sounds.  Skin:    General: Skin is warm and dry.     Comments: Left arm surgical site appears clean and dry. No evidence of drainage.   Neurological:     Mental Status: He is alert and oriented to person, place, and time.  Psychiatric:        Mood and Affect: Mood normal.     Lab Results Lab Results  Component Value Date   WBC 5.7 01/09/2019   HGB 8.7 (L) 01/09/2019   HCT 26.7 (L) 01/09/2019   MCV 95.0 01/09/2019   PLT 292 01/09/2019    Lab Results  Component Value Date   CREATININE 5.39 (H) 01/09/2019   BUN 30 (H) 01/09/2019   NA 139 01/09/2019   K 4.2 01/09/2019   CL 102 01/09/2019   CO2 27 01/09/2019    Lab Results  Component Value Date   ALT <5  01/09/2019   AST 19 01/09/2019   ALKPHOS 51 01/09/2019   BILITOT 0.6 01/09/2019     Microbiology: No results found for this or any previous visit (from the past 240 hour(s)).   Terri Piedra, NP [facility] for [place] [facility] [phone] Pager  01/09/2019  4:40 PM

## 2019-01-09 NOTE — Progress Notes (Addendum)
ANTICOAGULATION CONSULT NOTE - Follow Up Consult  Pharmacy Consult for Heparin Indication: DVT (IJ thrombus)  No Known Allergies  Patient Measurements: Height: 6\' 3"  (190.5 cm) Weight: 247 lb 5.7 oz (112.2 kg) IBW/kg (Calculated) : 84.5 Heparin Dosing Weight: 110 kg  Vital Signs: Temp: 98.6 F (37 C) (04/27 0611) Temp Source: Oral (04/27 0611) BP: 154/92 (04/27 0611) Pulse Rate: 54 (04/27 0611)  Labs: Recent Labs    01/07/19 0225 01/08/19 0246 01/09/19 0500  HGB 8.2* 8.4* 8.7*  HCT 25.5* 25.7* 26.7*  PLT 314 284 292  LABPROT 30.1* 21.7* 18.5*  INR 2.9* 1.9* 1.6*  HEPARINUNFRC 0.46 0.46 0.56  CREATININE 6.63* 4.74* 5.39*    Estimated Creatinine Clearance: 23.4 mL/min (A) (by C-G formula based on SCr of 5.39 mg/dL (H)).   Medications:  Scheduled:  . amLODipine  10 mg Oral Daily  . Chlorhexidine Gluconate Cloth  6 each Topical Q0600  . darbepoetin (ARANESP) injection - DIALYSIS  40 mcg Intravenous Q Thu-HD  . feeding supplement (ENSURE ENLIVE)  237 mL Oral BID BM  . FLUoxetine  40 mg Oral Daily  . folic acid  1 mg Oral Daily  . loxapine  25 mg Oral Daily  . loxapine  50 mg Oral QHS  . polyethylene glycol  17 g Oral Daily  . risperiDONE  9 mg Oral QHS  . senna-docusate  1 tablet Oral BID  . thiamine  100 mg Oral Daily  . topiramate  250 mg Oral QHS  . traZODone  300 mg Oral QHS   Infusions:  . sodium chloride    . sodium chloride    . sodium chloride Stopped (12/26/18 0534)  .  ceFAZolin (ANCEF) IV Stopped (01/07/19 1925)  . heparin 2,300 Units/hr (01/08/19 2312)    Assessment: [name] on heparin for IJ thrombus. Patient is now agreeable to AVF placement, so warfarin was stopped and heparin will be continued until surgery.  Heparin level remains therapeutic (0.56) on 2300 units/hr. CBC is stable and no bleeding or infusion issues noted.  Goal of Therapy:  Heparin level 0.3-0.7 units/ml Monitor platelets by anticoagulation protocol: Yes   Plan:   Continue heparin at 2300 units/hr Monitor daily heparin level, CBC  Senai Ramnath A. Levada Dy, PharmD, [place] Please utilize Amion for appropriate phone number to reach the unit pharmacist ([place])  Addendum:  Heparin off at ~0900 for surgery. OK to restart heparin at 1600 per order from [facility]. Will get f/u HL 6 hours from restart.   Grayden Burley A. Levada Dy, PharmD, [place] Please utilize Amion for appropriate phone number to reach the unit pharmacist ([place])     01/09/2019     8:04 AM

## 2019-01-09 NOTE — Progress Notes (Signed)
ANTICOAGULATION CONSULT NOTE - Follow Up Consult  Pharmacy Consult for Heparin, Warfarin Indication: DVT (IJ thrombus)  No Known Allergies  Patient Measurements: Height: 6\' 3"  (190.5 cm) Weight: 247 lb 5.7 oz (112.2 kg) IBW/kg (Calculated) : 84.5 Heparin Dosing Weight: 110 kg  Vital Signs: Temp: 97.2 F (36.2 C) (04/27 1232) Temp Source: Oral (04/27 0611) BP: 156/85 (04/27 1232) Pulse Rate: 54 (04/27 1232)  Labs: Recent Labs    01/07/19 0225 01/08/19 0246 01/09/19 0500  HGB 8.2* 8.4* 8.7*  HCT 25.5* 25.7* 26.7*  PLT 314 284 292  LABPROT 30.1* 21.7* 18.5*  INR 2.9* 1.9* 1.6*  HEPARINUNFRC 0.46 0.46 0.56  CREATININE 6.63* 4.74* 5.39*    Estimated Creatinine Clearance: 23.4 mL/min (A) (by C-G formula based on SCr of 5.39 mg/dL (H)).  Assessment: [name] on heparin for IJ thrombus. Patient is now agreeable to AVF placement, so warfarin was stopped and heparin will be continued until surgery.  Heparin level remains therapeutic (0.56) on 2300 units/hr. CBC is stable and no bleeding or infusion issues noted.  Warfarin to resume this pm  Goal of Therapy:  INR 2-3 Heparin level 0.3-0.7 units/ml Monitor platelets by anticoagulation protocol: Yes   Plan:  Continue heparin as ordered Warfarin 2.5 mg x 1 Daily INR  Levester Fresh, PharmD, BCPS, BCCCP Clinical Pharmacist [phone]  Please check AMION for all [place] numbers  01/09/2019 5:44 PM

## 2019-01-09 NOTE — Progress Notes (Signed)
Patient off floor to OR

## 2019-01-09 NOTE — Anesthesia Preprocedure Evaluation (Addendum)
Anesthesia Evaluation  Patient identified by MRN, date of birth, ID band Patient awake    Reviewed: Allergy & Precautions, NPO status , Patient's Chart, lab work & pertinent test results  Airway Mallampati: III  TM Distance: >3 FB Neck ROM: Full    Dental  (+) Poor Dentition, Missing, Loose, Chipped,    Pulmonary former smoker,     + decreased breath sounds      Cardiovascular negative cardio ROS   Rhythm:Regular Rate:Normal     Neuro/Psych Bipolar Disorder    GI/Hepatic negative GI ROS, Neg liver ROS,   Endo/Other  diabetes, Type 2, Oral Hypoglycemic Agents  Renal/GU ESRF and DialysisRenal disease     Musculoskeletal negative musculoskeletal ROS (+)   Abdominal Normal abdominal exam  (+)   Peds  Hematology negative hematology ROS (+)   Anesthesia Other Findings   Reproductive/Obstetrics                            Anesthesia Physical Anesthesia Plan  ASA: III  Anesthesia Plan: MAC   Post-op Pain Management:    Induction: Intravenous  PONV Risk Score and Plan: 3 and Ondansetron, Dexamethasone and Midazolam  Airway Management Planned: Oral ETT  Additional Equipment: None  Intra-op Plan:   Post-operative Plan: Extubation in OR  Informed Consent: I have reviewed the patients History and Physical, chart, labs and discussed the procedure including the risks, benefits and alternatives for the proposed anesthesia with the patient or authorized representative who has indicated his/her understanding and acceptance.     Dental advisory given  Plan Discussed with: CRNA  Anesthesia Plan Comments:       Anesthesia Quick Evaluation

## 2019-01-09 NOTE — Plan of Care (Signed)

## 2019-01-09 NOTE — Transfer of Care (Signed)
Immediate Anesthesia Transfer of Care Note  Patient: James [name]  Procedure(s) Performed: ARTERIOVENOUS (AV) FISTULA  ARM (Left Arm Upper)  Patient Location: PACU  Anesthesia Type:General  Level of Consciousness: awake, alert , oriented and sedated  Airway & Oxygen Therapy: Patient Spontanous Breathing and Patient connected to nasal cannula oxygen  Post-op Assessment: Report given to RN, Post -op Vital signs reviewed and stable and Patient moving all extremities  Post vital signs: Reviewed and stable  Last Vitals:  Vitals Value Taken Time  BP 155/88 01/09/2019 12:17 PM  Temp    Pulse 67 01/09/2019 12:27 PM  Resp 11 01/09/2019 12:27 PM  SpO2 94 % 01/09/2019 12:27 PM  Vitals shown include unvalidated device data.  Last Pain:  Vitals:   01/09/19 1202  TempSrc:   PainSc: 0-No pain      Patients Stated Pain Goal: 4 (20/10/07 1219)  Complications: No apparent anesthesia complications

## 2019-01-09 NOTE — Progress Notes (Signed)
Patient back in room from PACU. VS stable and patient in NAD.

## 2019-01-09 NOTE — Progress Notes (Addendum)
PROGRESS NOTE    James [name]  VOH:[id number] DOB: 1975/11/29 DOA: 12/19/2018 PCP: No primary care provider on file.   Brief Narrative: 43 year old male with history of hypertension, diabetes mellitus type 2, bipolar disorder presented on 12/17/2018 to [facility] after being found down for 2 days following binge alcohol drinking. He was found to have AKI, metabolic acidosis, leukocytosis and LFT elevation with rhabdomyolysis, CK of 58,000. He was treated with aggressive IV fluids but unfortunately his renal function got worse with decreased urine output and CK rose to 130,000. No improvement with IV diuresis. He was transferred to [facility] on 12/19/2018. Nephrology was consulted. Tunneled HD catheter was inserted and hemodialysis was started on 12/20/2018. HD catheter subsequently removed 12/27/2018. Patient also noted to have a MSSA bacteremia.   Assessment & Plan:   Principal Problem:   MSSA bacteremia Active Problems:   Diabetes mellitus ([place])   Bipolar affective disorder ([place])   ARF (acute renal failure) ([place])   Alcoholism ([place])   AKI (acute kidney injury) ([place])   Hemodialysis catheter infection ([place])   Traumatic rhabdomyolysis ([place])   Thrombosis of right internal jugular vein ([place])   #1. Acute renal failure likely secondary to rhabdomyolysis/ATN/polysubstance abuse. Patient started on hemodialysis via a tunneled hemodialysis catheter on 12/20/2018. Patient is being planned for  vascular access placement this morning 01/09/2019. Nephrology following with HD Vascular surgery on board.   #2. Volume overload non cardiogenic. Likely secondary to acute renal failure. Responsive to hemodialysis. Patient clinically asymptomatic. C/w HD per nephrology  #3. Bipolar disorder with anxiety. Continue with SSRI, Risperdal, Topamax and trazodone.  #4. Right internal jugular vein thrombosis. Was on heparin drip only due to surgery.  Will resume coumadin  and with heparin bridge  C/w coumadin and achieve therapeutic INR prior to discharge.  #5. MSSA bacteremia. Currently on cefazolin.  Repeat blood culture showed no growth.till date C/w cefazolin with HD per ID recc.  #6. Polysubstance abuse/Alcoholism. Patient has no symptoms of alcohol withdrawal. Initially on Librium which has been discontinued.  #7. Diabetes mellitus type 2. Hemoglobin A1c 5.2 Continue with sliding scale insulin Fingersticks AC at bedtime Hypoglycemic protocol  #8. Detectable troponin likely secondary to acute kidney injury from rhabdomyolysis/ATN. Patient denies any chest pain. No intervention required.  #9. Hypertension. Controlled with Lopressor and Norvasc. Continue with current medication. Monitor blood pressure    DVT prophylaxis:Heparin gtt with coumadin  Code Status:Full  Family Communication: I spoke with Ms. Reinaldo Meeker, at [phone], patient's mum and updated her on care plan for her son. Disposition Plan:Pending therapeutic INR    Consultants:   Nephrology  Vascular surgery    Procedures: A-V graft placement   Antimicrobials: Cefazolin with HD    Subjective: Patient was seen and evaluated at bedside. Stable and calm. Not in acute distress. S/p left forearm AV fistula placement. Denies any pain. Coumadin was stopped and will be restarted today while monitoring his INR for goal of 2-3 prior to discharge home.  Objective: Vitals:   01/08/19 1446 01/08/19 2119 01/09/19 0500 01/09/19 0611  BP: (!) 145/89 (!) 150/90  (!) 154/92  Pulse: 60 (!) 57  (!) 54  Resp: 18 18  16   Temp: 98.1 F (36.7 C) 98.3 F (36.8 C)  98.6 F (37 C)  TempSrc:  Oral  Oral  SpO2: 96% 95%  95%  Weight:   112.2 kg   Height:        Intake/Output Summary (Last 24 hours) at 01/09/2019 0757 Last  data filed at 01/09/2019 0744 Gross per 24 hour  Intake 1025 ml  Output 3350 ml  Net -2325 ml   Filed Weights   01/07/19 1057 01/08/19 0659  01/09/19 0500  Weight: 113.3 kg 114.3 kg 112.2 kg    Examination:  General exam: Appears calm and comfortable  Respiratory system: Clear to auscultation. Respiratory effort normal. Cardiovascular system: S1 & S2 heard, RRR. No JVD, murmurs, rubs, gallops or clicks. No pedal edema. Gastrointestinal system: Abdomen is nondistended, soft and nontender. No organomegaly or masses felt. Normal bowel sounds heard. Central nervous system: Alert and oriented. No focal neurological deficits. Extremities: Left forearm AV graft. 1+ pitting pedal edema. Skin: Right IJ PermCath in place.  Clean dressing. Psychiatry: Judgement and insight appear normal. Mood & affect appropriate.     Data Reviewed: I have personally reviewed following labs and imaging studies  CBC: Recent Labs  Lab 01/05/19 0311 01/06/19 0215 01/07/19 0225 01/08/19 0246 01/09/19 0500  WBC 6.4 6.5 6.0 6.7 5.7  HGB 7.9* 8.3* 8.2* 8.4* 8.7*  HCT 23.6* 25.4* 25.5* 25.7* 26.7*  MCV 92.9 93.7 94.1 94.1 95.0  PLT 307 309 314 284 761   Basic Metabolic Panel: Recent Labs  Lab 01/04/19 0313 01/05/19 0311 01/06/19 0215 01/07/19 0225 01/08/19 0246 01/09/19 0500  NA 132* 133* 134* 134* 137 139  K 4.5 4.8 4.4 4.2 4.0 4.2  CL 96* 97* 99 98 99 102  CO2 25 25 26 24 26 27   GLUCOSE 78 77 75 78 78 82  BUN 30* 42* 28* 36* 23* 30*  CREATININE 5.78* 6.99* 5.38* 6.63* 4.74* 5.39*  CALCIUM 8.5* 8.6* 8.5* 8.6* 8.7* 8.9  PHOS 4.8* 6.0* 4.8* 5.7* 4.7*  --    GFR: Estimated Creatinine Clearance: 23.4 mL/min (A) (by C-G formula based on SCr of 5.39 mg/dL (H)). Liver Function Tests: Recent Labs  Lab 01/05/19 0311 01/06/19 0215 01/07/19 0225 01/08/19 0246 01/09/19 0500  AST  --   --   --   --  19  ALT  --   --   --   --  <5  ALKPHOS  --   --   --   --  51  BILITOT  --   --   --   --  0.6  PROT  --   --   --   --  5.7*  ALBUMIN 2.4* 2.5* 2.5* 2.6* 2.7*   No results for input(s): LIPASE, AMYLASE in the last 168 hours. No results  for input(s): AMMONIA in the last 168 hours. Coagulation Profile: Recent Labs  Lab 01/05/19 1158 01/06/19 0215 01/07/19 0225 01/08/19 0246 01/09/19 0500  INR 1.3* 2.2* 2.9* 1.9* 1.6*   Cardiac Enzymes: No results for input(s): CKTOTAL, CKMB, CKMBINDEX, TROPONINI in the last 168 hours. BNP (last 3 results) No results for input(s): PROBNP in the last 8760 hours. HbA1C: No results for input(s): HGBA1C in the last 72 hours. CBG: Recent Labs  Lab 01/02/19 2150 01/02/19 2215  GLUCAP 75 76   Lipid Profile: No results for input(s): CHOL, HDL, LDLCALC, TRIG, CHOLHDL, LDLDIRECT in the last 72 hours. Thyroid Function Tests: No results for input(s): TSH, T4TOTAL, FREET4, T3FREE, THYROIDAB in the last 72 hours. Anemia Panel: No results for input(s): VITAMINB12, FOLATE, FERRITIN, TIBC, IRON, RETICCTPCT in the last 72 hours. Sepsis Labs: No results for input(s): PROCALCITON, LATICACIDVEN in the last 168 hours.  No results found for this or any previous visit (from the past 240 hour(s)).  Radiology Studies: Vas [place] Upper Ext Vein Mapping (pre-op Avf)  Result Date: 01/07/2019 UPPER EXTREMITY VEIN MAPPING Performing Technologist: Abram Sander RVS  Examination Guidelines: A complete evaluation includes B-mode imaging, spectral Doppler, color Doppler, and power Doppler as needed of all accessible portions of each vessel. Bilateral testing is considered an integral part of a complete examination. Limited examinations for reoccurring indications may be performed as noted. +-----------------+-------------+----------+--------------+  Right Cephalic    Diameter (cm) Depth (cm)    Findings     +-----------------+-------------+----------+--------------+  Shoulder              0.11         0.96                    +-----------------+-------------+----------+--------------+  Prox upper arm        0.25         0.97                    +-----------------+-------------+----------+--------------+  Mid  upper arm         0.27         0.64                    +-----------------+-------------+----------+--------------+  Dist upper arm        0.32         0.66                    +-----------------+-------------+----------+--------------+  Antecubital fossa     0.41         0.36                    +-----------------+-------------+----------+--------------+  Prox forearm          0.29         0.54                    +-----------------+-------------+----------+--------------+  Mid forearm           0.25         0.52      branching     +-----------------+-------------+----------+--------------+  Dist forearm                               not visualized  +-----------------+-------------+----------+--------------+  Wrist                                      not visualized  +-----------------+-------------+----------+--------------+ +-----------------+-------------+----------+--------------+  Right Basilic     Diameter (cm) Depth (cm)    Findings     +-----------------+-------------+----------+--------------+  Prox upper arm        0.48         1.41                    +-----------------+-------------+----------+--------------+  Mid upper arm         0.56         1.48                    +-----------------+-------------+----------+--------------+  Dist upper arm        0.52         1.27                    +-----------------+-------------+----------+--------------+  Antecubital fossa  0.42         0.51      branching     +-----------------+-------------+----------+--------------+  Prox forearm                               not visualized  +-----------------+-------------+----------+--------------+  Mid forearm                                not visualized  +-----------------+-------------+----------+--------------+  Distal forearm                             not visualized  +-----------------+-------------+----------+--------------+  Elbow                                      not visualized   +-----------------+-------------+----------+--------------+  Wrist                                      not visualized  +-----------------+-------------+----------+--------------+ +-----------------+-------------+----------+---------+  Left Cephalic     Diameter (cm) Depth (cm) Findings   +-----------------+-------------+----------+---------+  Shoulder              0.25         0.78               +-----------------+-------------+----------+---------+  Prox upper arm        0.28         0.71               +-----------------+-------------+----------+---------+  Mid upper arm         0.28         0.48               +-----------------+-------------+----------+---------+  Dist upper arm        0.33         0.49               +-----------------+-------------+----------+---------+  Antecubital fossa     0.32         0.47               +-----------------+-------------+----------+---------+  Prox forearm          0.36         0.71               +-----------------+-------------+----------+---------+  Mid forearm           0.25         0.52    branching  +-----------------+-------------+----------+---------+  Dist forearm          0.22         0.30               +-----------------+-------------+----------+---------+  Wrist                 0.17         0.26               +-----------------+-------------+----------+---------+ +-----------------+-------------+----------+--------------+  Left Basilic      Diameter (cm) Depth (cm)    Findings     +-----------------+-------------+----------+--------------+  Prox upper arm        0.29  1.09                    +-----------------+-------------+----------+--------------+  Mid upper arm         0.32         0.99                    +-----------------+-------------+----------+--------------+  Dist upper arm        0.22         0.81                    +-----------------+-------------+----------+--------------+  Antecubital fossa     0.23         0.51      branching      +-----------------+-------------+----------+--------------+  Prox forearm                               not visualized  +-----------------+-------------+----------+--------------+  Mid forearm                                not visualized  +-----------------+-------------+----------+--------------+  Distal forearm                             not visualized  +-----------------+-------------+----------+--------------+  Elbow                                      not visualized  +-----------------+-------------+----------+--------------+  Wrist                                      not visualized  +-----------------+-------------+----------+--------------+ *See table(s) above for measurements and observations.  Diagnosing physician: Monica Martinez MD Electronically signed by Monica Martinez MD on 01/07/2019 at 10:29:56 PM.    Final         Scheduled Meds:  amLODipine  10 mg Oral Daily   Chlorhexidine Gluconate Cloth  6 each Topical Q0600   darbepoetin (ARANESP) injection - DIALYSIS  40 mcg Intravenous Q Thu-HD   feeding supplement (ENSURE ENLIVE)  237 mL Oral BID BM   FLUoxetine  40 mg Oral Daily   folic acid  1 mg Oral Daily   loxapine  25 mg Oral Daily   loxapine  50 mg Oral QHS   polyethylene glycol  17 g Oral Daily   risperiDONE  9 mg Oral QHS   senna-docusate  1 tablet Oral BID   thiamine  100 mg Oral Daily   topiramate  250 mg Oral QHS   traZODone  300 mg Oral QHS   Continuous Infusions:  sodium chloride     sodium chloride     sodium chloride Stopped (12/26/18 0534)    ceFAZolin (ANCEF) IV Stopped (01/07/19 1925)   heparin 2,300 Units/hr (01/08/19 2312)     LOS: 21 days       Elie Confer, MD [facility] Pager [phone]   If 7PM-7AM, please contact night-coverage [email or web address] Password [facility] 01/09/2019, 7:57 AM

## 2019-01-09 NOTE — Care Management Important Message (Signed)
Important Message  Patient Details  Name: James [name] MRN: [id number] Date of Birth: Mar 31, 1976   Medicare Important Message Given:  Yes    [name] 01/09/2019, 3:31 PM

## 2019-01-10 ENCOUNTER — Encounter (HOSPITAL_COMMUNITY): Payer: Self-pay | Admitting: Vascular Surgery

## 2019-01-10 LAB — COMPREHENSIVE METABOLIC PANEL
ALT: <5 U/L (ref 0–44)
AST: 15 U/L (ref 15–41)
Albumin: 3 g/dL — ABNORMAL LOW (ref 3.5–5.0)
Alkaline Phosphatase: 55 U/L (ref 38–126)
Anion gap: 12 (ref 5–15)
BUN: 38 mg/dL — ABNORMAL HIGH (ref 6–20)
CO2: 24 mmol/L (ref 22–32)
Calcium: 9.2 mg/dL (ref 8.9–10.3)
Chloride: 103 mmol/L (ref 98–111)
Creatinine, Ser: 5.52 mg/dL — ABNORMAL HIGH (ref 0.61–1.24)
GFR calc Af Amer: 13 mL/min — ABNORMAL LOW (ref >60)
GFR calc non Af Amer: 11 mL/min — ABNORMAL LOW (ref >60)
Glucose, Bld: 76 mg/dL (ref 70–99)
Potassium: 4 mmol/L (ref 3.5–5.1)
Sodium: 139 mmol/L (ref 135–145)
Total Bilirubin: 0.6 mg/dL (ref 0.3–1.2)
Total Protein: 6.2 g/dL — ABNORMAL LOW (ref 6.5–8.1)

## 2019-01-10 LAB — CBC
HCT: 27.6 % — ABNORMAL LOW (ref 39.0–52.0)
Hemoglobin: 8.8 g/dL — ABNORMAL LOW (ref 13.0–17.0)
MCH: 30.8 pg (ref 26.0–34.0)
MCHC: 31.9 g/dL (ref 30.0–36.0)
MCV: 96.5 fL (ref 80.0–100.0)
Platelets: 291 10*3/uL (ref 150–400)
RBC: 2.86 MIL/uL — ABNORMAL LOW (ref 4.22–5.81)
RDW: 13 % (ref 11.5–15.5)
WBC: 7.5 10*3/uL (ref 4.0–10.5)
nRBC: 0 % (ref 0.0–0.2)

## 2019-01-10 LAB — TRANSFERRIN: Transferrin: 214 mg/dL (ref 180–329)

## 2019-01-10 LAB — HEPARIN LEVEL (UNFRACTIONATED)
Heparin Unfractionated: 0.72 IU/mL — ABNORMAL HIGH (ref 0.30–0.70)
Heparin Unfractionated: 0.61 IU/mL (ref 0.30–0.70)

## 2019-01-10 LAB — PHOSPHORUS: Phosphorus: 6.6 mg/dL — ABNORMAL HIGH (ref 2.5–4.6)

## 2019-01-10 LAB — PROTIME-INR
INR: 1.5 — ABNORMAL HIGH (ref 0.8–1.2)
Prothrombin Time: 18.1 seconds — ABNORMAL HIGH (ref 11.4–15.2)

## 2019-01-10 LAB — FERRITIN: Ferritin: 129 ng/mL (ref 24–336)

## 2019-01-10 MED ORDER — HEPARIN SODIUM (PORCINE) 1000 UNIT/ML IJ SOLN
INTRAMUSCULAR | Status: AC
  Administered 2019-01-10: 13:00:00
  Filled 2019-01-10: qty 4

## 2019-01-10 MED ORDER — WARFARIN SODIUM 5 MG PO TABS
5.0000 mg | ORAL_TABLET | Freq: Once | ORAL | Status: AC
  Administered 2019-01-10: 5 mg via ORAL
  Filled 2019-01-10 (×2): qty 1

## 2019-01-10 NOTE — Progress Notes (Signed)
PROGRESS NOTE    James [name]  QMG:[id number] DOB: Aug 12, 1976 DOA: 12/19/2018 PCP: No primary care provider on file.    Brief Narrative:  43 year old male with history of hypertension, diabetes mellitus type 2, bipolar disorder presented on 12/17/2018 to [facility] after being found down for 2 days following binge alcohol drinking. He was found to have AKI, metabolic acidosis, leukocytosis and LFT elevation with rhabdomyolysis, CK of 58,000. He was treated with aggressive IV fluids but unfortunately his renal function got worse with decreased urine output and CK rose to 130,000. No improvement with IV diuresis. He was transferred to [facility] on 12/19/2018. Nephrology was consulted. Tunneled HD catheter was inserted and hemodialysis was started on 12/20/2018. HD catheter subsequently removed 12/27/2018. Patient also noted to have a MSSA bacteremia.  Assessment & Plan:   Principal Problem:   MSSA bacteremia Active Problems:   Diabetes mellitus ([place])   Bipolar affective disorder ([place])   ARF (acute renal failure) ([place])   Alcoholism ([place])   AKI (acute kidney injury) ([place])   Hemodialysis catheter infection ([place])   Traumatic rhabdomyolysis ([place])   Thrombosis of right internal jugular vein ([place])   #1. Acute renal failure likely secondary to rhabdomyolysis/ATN/polysubstance abuse. Patient started on hemodialysis via a tunneled hemodialysis catheter on 12/20/2018. Patientis being planned forvascular access placement on 01/09/2019. Nephrology following with HD Vascular surgery on board.   #2. Volume overload non cardiogenic. Likely secondary to acute renal failure. Responsive to hemodialysis. Patient clinically asymptomatic. C/w HD per nephrology  #3. Bipolar disorder with anxiety. Continue with SSRI, Risperdal, Topamax and trazodone.  #4. Right internal jugular vein thrombosis. Was on heparin drip only due to surgery.  Will resume coumadin and with  heparin bridge  C/w coumadin and achieve therapeutic INR prior to discharge.  #5. MSSA bacteremia. Currently on cefazolin.  Repeat blood culture showed no growth.till date C/w cefazolin with HD per ID recc.  #6. Polysubstance abuse/Alcoholism. Patient has no symptoms of alcohol withdrawal. Initially on Librium which has been discontinued.  #7. Diabetes mellitus type 2. Hemoglobin A1c 5.2 Continue with sliding scale insulin Fingersticks AC at bedtime Hypoglycemic protocol  #8. Detectable troponin likely secondary to acute kidney injury from rhabdomyolysis/ATN. Patient denies any chest pain. No intervention required.  #9. Hypertension. Controlled with Lopressor and Norvasc. Continue with current medication. Monitor blood pressure  #10 IJ thrombus, new finding Currently on heparin bridge and coumadin -Continue to bridge until coumadin therapeutic  DVT prophylaxis: Heparin bridge with coumadin Code Status: Full Family Communication: Pt in room, family not at bedside Disposition Plan: D/c when INR therapeutic  Consultants:   Vascular Surgery  Nephrology  ID  Procedures:   AV graft placement  Antimicrobials: Anti-infectives (From admission, onward)   Start     Dose/Rate Route Frequency Ordered Stop   01/05/19 1800  ceFAZolin (ANCEF) IVPB 2g/100 mL premix     2 g 200 mL/hr over 30 Minutes Intravenous Every T-Th-Sa (1800) 01/05/19 1301 01/24/19 2359   01/03/19 1800  ceFAZolin (ANCEF) IVPB 1 g/50 mL premix  Status:  Discontinued     1 g 100 mL/hr over 30 Minutes Intravenous Every 24 hours 01/02/19 1405 01/05/19 1300   01/02/19 1215  ceFAZolin (ANCEF) IVPB 2g/100 mL premix     2 g 200 mL/hr over 30 Minutes Intravenous To Radiology 01/02/19 1206 01/02/19 1250   12/31/18 1030  ceFAZolin (ANCEF) IVPB 1 g/50 mL premix     1 g 100 mL/hr over 30 Minutes Intravenous  Once 12/31/18 1029 12/31/18 1200   12/27/18 1800  ceFAZolin (ANCEF) IVPB 1 g/50 mL premix   Status:  Discontinued     1 g 100 mL/hr over 30 Minutes Intravenous Every 24 hours 12/27/18 1152 01/02/19 1405   12/26/18 1200  ceFAZolin (ANCEF) IVPB 2g/100 mL premix     2 g 200 mL/hr over 30 Minutes Intravenous Every M-W-F (Hemodialysis) 12/26/18 0253 12/26/18 1609   12/26/18 0330  ceFAZolin (ANCEF) IVPB 2g/100 mL premix     2 g 200 mL/hr over 30 Minutes Intravenous  Once 12/26/18 0253 12/26/18 0413       Subjective: Eager to go home  Objective: Vitals:   01/10/19 1045 01/10/19 1115 01/10/19 1125 01/10/19 1247  BP: (!) 154/89 (!) 145/90 (!) 146/86 (!) 146/85  Pulse: (!) 51 (!) 55 (!) 59 (!) 53  Resp:   18 16  Temp:   97.8 F (36.6 C) (!) 97.3 F (36.3 C)  TempSrc:   Oral Oral  SpO2:   98% 99%  Weight:   108.9 kg   Height:        Intake/Output Summary (Last 24 hours) at 01/10/2019 1755 Last data filed at 01/10/2019 1711 Gross per 24 hour  Intake 1054.25 ml  Output 3575 ml  Net -2520.75 ml   Filed Weights   01/10/19 0533 01/10/19 0720 01/10/19 1125  Weight: 112.3 kg 111.6 kg 108.9 kg    Examination:  General exam: Appears calm and comfortable  Respiratory system: Clear to auscultation. Respiratory effort normal. Cardiovascular system: S1 & S2 heard, RRR. Gastrointestinal system: Abdomen is nondistended, soft and nontender. No organomegaly or masses felt. Normal bowel sounds heard. Central nervous system: Alert and oriented. No focal neurological deficits. Extremities: Symmetric 5 x 5 power. Skin: No rashes, lesions Psychiatry: Judgement and insight appear normal. Mood & affect appropriate.   Data Reviewed: I have personally reviewed following labs and imaging studies  CBC: Recent Labs  Lab 01/06/19 0215 01/07/19 0225 01/08/19 0246 01/09/19 0500 01/10/19 0745  WBC 6.5 6.0 6.7 5.7 7.5  HGB 8.3* 8.2* 8.4* 8.7* 8.8*  HCT 25.4* 25.5* 25.7* 26.7* 27.6*  MCV 93.7 94.1 94.1 95.0 96.5  PLT 309 314 284 292 295   Basic Metabolic Panel: Recent Labs  Lab  01/05/19 0311 01/06/19 0215 01/07/19 0225 01/08/19 0246 01/09/19 0500 01/10/19 0745  NA 133* 134* 134* 137 139 139  K 4.8 4.4 4.2 4.0 4.2 4.0  CL 97* 99 98 99 102 103  CO2 25 26 24 26 27 24   GLUCOSE 77 75 78 78 82 76  BUN 42* 28* 36* 23* 30* 38*  CREATININE 6.99* 5.38* 6.63* 4.74* 5.39* 5.52*  CALCIUM 8.6* 8.5* 8.6* 8.7* 8.9 9.2  PHOS 6.0* 4.8* 5.7* 4.7*  --  6.6*   GFR: Estimated Creatinine Clearance: 22.5 mL/min (A) (by C-G formula based on SCr of 5.52 mg/dL (H)). Liver Function Tests: Recent Labs  Lab 01/06/19 0215 01/07/19 0225 01/08/19 0246 01/09/19 0500 01/10/19 0745  AST  --   --   --  19 15  ALT  --   --   --  <5 <5  ALKPHOS  --   --   --  51 55  BILITOT  --   --   --  0.6 0.6  PROT  --   --   --  5.7* 6.2*  ALBUMIN 2.5* 2.5* 2.6* 2.7* 3.0*   No results for input(s): LIPASE, AMYLASE in the last 168 hours. No results for input(s):  AMMONIA in the last 168 hours. Coagulation Profile: Recent Labs  Lab 01/06/19 0215 01/07/19 0225 01/08/19 0246 01/09/19 0500 01/10/19 0808  INR 2.2* 2.9* 1.9* 1.6* 1.5*   Cardiac Enzymes: No results for input(s): CKTOTAL, CKMB, CKMBINDEX, TROPONINI in the last 168 hours. BNP (last 3 results) No results for input(s): PROBNP in the last 8760 hours. HbA1C: No results for input(s): HGBA1C in the last 72 hours. CBG: Recent Labs  Lab 01/09/19 1202  GLUCAP 71   Lipid Profile: No results for input(s): CHOL, HDL, LDLCALC, TRIG, CHOLHDL, LDLDIRECT in the last 72 hours. Thyroid Function Tests: No results for input(s): TSH, T4TOTAL, FREET4, T3FREE, THYROIDAB in the last 72 hours. Anemia Panel: Recent Labs    01/10/19 0808  FERRITIN 129   Sepsis Labs: No results for input(s): PROCALCITON, LATICACIDVEN in the last 168 hours.  No results found for this or any previous visit (from the past 240 hour(s)).   Radiology Studies: No results found.  Scheduled Meds: . amLODipine  10 mg Oral Daily  . Chlorhexidine Gluconate Cloth   6 each Topical Q0600  . [START ON 01/12/2019] darbepoetin (ARANESP) injection - DIALYSIS  100 mcg Intravenous Q Thu-HD  . feeding supplement (ENSURE ENLIVE)  237 mL Oral BID BM  . FLUoxetine  40 mg Oral Daily  . folic acid  1 mg Oral Daily  . loxapine  25 mg Oral Daily  . loxapine  50 mg Oral QHS  . polyethylene glycol  17 g Oral Daily  . risperiDONE  9 mg Oral QHS  . senna-docusate  1 tablet Oral BID  . thiamine  100 mg Oral Daily  . topiramate  250 mg Oral QHS  . traZODone  300 mg Oral QHS  . Warfarin - Pharmacist Dosing Inpatient   Does not apply q1800   Continuous Infusions: . sodium chloride    . sodium chloride    . sodium chloride 500 mL (01/09/19 0935)  .  ceFAZolin (ANCEF) IV 2 g (01/10/19 1711)  . heparin 2,400 Units/hr (01/10/19 1425)     LOS: 22 days   Marylu Lund, MD [facility] Pager On Amion  If 7PM-7AM, please contact night-coverage 01/10/2019, 5:55 PM

## 2019-01-10 NOTE — Progress Notes (Addendum)
ANTICOAGULATION CONSULT NOTE - Follow Up Consult  Pharmacy Consult for Heparin, Warfarin Indication: DVT (IJ thrombus)  No Known Allergies  Patient Measurements: Height: 6\' 3"  (190.5 cm) Weight: 246 lb 0.5 oz (111.6 kg) IBW/kg (Calculated) : 84.5 Heparin Dosing Weight: 110 kg  Vital Signs: Temp: 97.7 F (36.5 C) (04/28 0725) Temp Source: Oral (04/28 0725) BP: 147/89 (04/28 0745) Pulse Rate: 60 (04/28 0745)  Labs: Recent Labs    01/08/19 0246 01/09/19 0500 01/09/19 2138 01/10/19 0745 01/10/19 0808  HGB 8.4* 8.7*  --  8.8*  --   HCT 25.7* 26.7*  --  27.6*  --   PLT 284 292  --  291  --   LABPROT 21.7* 18.5*  --   --  18.1*  INR 1.9* 1.6*  --   --  1.5*  HEPARINUNFRC 0.46 0.56 0.29*  --  0.72*  CREATININE 4.74* 5.39*  --  5.52*  --     Estimated Creatinine Clearance: 22.8 mL/min (A) (by C-G formula based on SCr of 5.52 mg/dL (H)).  Assessment: [name] on heparin for IJ thrombus. S/p AVF placement with heparin bridge to coumadin.  Heparin level up to supratherapeutic (0.72) on 2500 units/hr. CBC is stable and no bleeding or infusion issues noted.  INR 1.5 today after 1 dose of warfarin. Will increase next dose.   Goal of Therapy:  INR 2-3 Heparin level 0.3-0.7 units/ml Monitor platelets by anticoagulation protocol: Yes   Plan:  Decrease heparin to 2400 units/hr Will f/u 8 hr heparin level Warfarin 5mg  PO x 1 Daily INR  Trystin Hargrove A. Levada Dy, PharmD, [place] Please utilize Amion for appropriate phone number to reach the unit pharmacist ([place])     01/10/2019 10:00 AM

## 2019-01-10 NOTE — Procedures (Signed)
I was present at this dialysis session. I have reviewed the session itself and made appropriate changes.   TDC Qb 400 UF goal 3L. COnt to make good amount of urine. SCr worsened in past two 24h intervals though.  Reduce UF goal to 2L.    On Cefazolin for MSSA bacteremia will need 4wk course, [facility] note reviewed  INR 1.5 on warfarin and hep bridge for catheter related thrombus.  Has outpt HD chair.  Cont HD THS while inpatient awaiting therapeutic INR. Follow daily BMP also, to see if any suggestion of recovered GFR.   Filed Weights   01/09/19 0500 01/10/19 0533 01/10/19 0720  Weight: 112.2 kg 112.3 kg 111.6 kg    Recent Labs  Lab 01/10/19 0745  NA 139  K 4.0  CL 103  CO2 24  GLUCOSE 76  BUN 38*  CREATININE 5.52*  CALCIUM 9.2  PHOS 6.6*    Recent Labs  Lab 01/08/19 0246 01/09/19 0500 01/10/19 0745  WBC 6.7 5.7 7.5  HGB 8.4* 8.7* 8.8*  HCT 25.7* 26.7* 27.6*  MCV 94.1 95.0 96.5  PLT 284 292 291    Scheduled Meds: . amLODipine  10 mg Oral Daily  . Chlorhexidine Gluconate Cloth  6 each Topical Q0600  . [START ON 01/12/2019] darbepoetin (ARANESP) injection - DIALYSIS  100 mcg Intravenous Q Thu-HD  . feeding supplement (ENSURE ENLIVE)  237 mL Oral BID BM  . FLUoxetine  40 mg Oral Daily  . folic acid  1 mg Oral Daily  . loxapine  25 mg Oral Daily  . loxapine  50 mg Oral QHS  . polyethylene glycol  17 g Oral Daily  . risperiDONE  9 mg Oral QHS  . senna-docusate  1 tablet Oral BID  . thiamine  100 mg Oral Daily  . topiramate  250 mg Oral QHS  . traZODone  300 mg Oral QHS  . Warfarin - Pharmacist Dosing Inpatient   Does not apply q1800   Continuous Infusions: . sodium chloride    . sodium chloride    . sodium chloride 500 mL (01/09/19 0935)  .  ceFAZolin (ANCEF) IV Stopped (01/07/19 1925)  . heparin 2,500 Units/hr (01/10/19 0325)   PRN Meds:.sodium chloride, sodium chloride, sodium chloride, acetaminophen, alteplase, bisacodyl, heparin, heparin, hydrALAZINE,  lidocaine (PF), lidocaine-prilocaine, [DISCONTINUED] ondansetron **OR** ondansetron (ZOFRAN) IV, oxyCODONE, pentafluoroprop-tetrafluoroeth, sodium chloride flush, sodium chloride flush   [name]  MD 01/10/2019, 10:03 AM

## 2019-01-10 NOTE — Progress Notes (Signed)
ANTICOAGULATION CONSULT NOTE - Follow Up Consult  Pharmacy Consult for Heparin, Warfarin Indication: DVT (IJ thrombus)  No Known Allergies  Patient Measurements: Height: 6\' 3"  (190.5 cm) Weight: 240 lb 1.3 oz (108.9 kg) IBW/kg (Calculated) : 84.5 Heparin Dosing Weight: 110 kg  Vital Signs: Temp: 97.3 F (36.3 C) (04/28 1247) Temp Source: Oral (04/28 1247) BP: 146/85 (04/28 1247) Pulse Rate: 53 (04/28 1247)  Labs: Recent Labs    01/08/19 0246 01/09/19 0500 01/09/19 2138 01/10/19 0745 01/10/19 0808 01/10/19 1620  HGB 8.4* 8.7*  --  8.8*  --   --   HCT 25.7* 26.7*  --  27.6*  --   --   PLT 284 292  --  291  --   --   LABPROT 21.7* 18.5*  --   --  18.1*  --   INR 1.9* 1.6*  --   --  1.5*  --   HEPARINUNFRC 0.46 0.56 0.29*  --  0.72* 0.61  CREATININE 4.74* 5.39*  --  5.52*  --   --     Estimated Creatinine Clearance: 22.5 mL/min (A) (by C-G formula based on SCr of 5.52 mg/dL (H)).  Assessment: [name] on heparin for IJ thrombus. S/p AVF placement with heparin bridge to coumadin.  Heparin level now therapeutic  INR 1.5 today after 1 dose of warfarin. Will increase next dose.   Goal of Therapy:  INR 2-3 Heparin level 0.3-0.7 units/ml Monitor platelets by anticoagulation protocol: Yes   Plan:  Continue heparin at 2400 units / hr Warfarin 5mg  PO x 1 Daily INR  Thank you Anette Guarneri, PharmD [phone] Please utilize Amion for appropriate phone number to reach the unit pharmacist ([facility])     01/10/2019 5:27 PM

## 2019-01-10 NOTE — Progress Notes (Signed)
Pt leaving unit for dialysis via bed with transport staff. Pt verbalized comprehension of plan for dialysis. Chart sent with patient

## 2019-01-10 NOTE — Progress Notes (Signed)
[facility]  Subjective  - On HD doing well over all.   Objective (!) 147/89 60 97.7 F (36.5 C) (Oral) 17 98%  Intake/Output Summary (Last 24 hours) at 01/10/2019 0835 Last data filed at 01/10/2019 0300 Gross per 24 hour  Intake 1324.25 ml  Output 2110 ml  Net -785.75 ml    Left forearm incision healing well, palpable radial pulse distal.  Palpable thrill in fistula.  Grip 5/5.    Assessment/Planning: POD # 1 left RC fistula creation  F/U in 6 weeks for fistula duplex with Dr. Donnetta Hutching He has a functioning right [facility] Will not actively follow.  Roxy Horseman 01/10/2019 8:35 AM --  Laboratory Lab Results: Recent Labs    01/09/19 0500 01/10/19 0745  WBC 5.7 7.5  HGB 8.7* 8.8*  HCT 26.7* 27.6*  PLT 292 291   BMET Recent Labs    01/09/19 0500 01/10/19 0745  NA 139 139  K 4.2 4.0  CL 102 103  CO2 27 24  GLUCOSE 82 76  BUN 30* 38*  CREATININE 5.39* 5.52*  CALCIUM 8.9 9.2    COAG Lab Results  Component Value Date   INR 1.5 (H) 01/10/2019   INR 1.6 (H) 01/09/2019   INR 1.9 (H) 01/08/2019   No results found for: PTT

## 2019-01-10 NOTE — Anesthesia Postprocedure Evaluation (Signed)
Anesthesia Post Note  Patient: James [name]  Procedure(s) Performed: ARTERIOVENOUS (AV) FISTULA  ARM (Left Arm Upper)     Patient location during evaluation: PACU Anesthesia Type: MAC Level of consciousness: awake and alert Pain management: pain level controlled Vital Signs Assessment: post-procedure vital signs reviewed and stable Respiratory status: spontaneous breathing, nonlabored ventilation, respiratory function stable and patient connected to nasal cannula oxygen Cardiovascular status: stable and blood pressure returned to baseline Postop Assessment: no apparent nausea or vomiting Anesthetic complications: no    Last Vitals:  Vitals:   01/10/19 0725 01/10/19 0745  BP: (!) 150/92 (!) 147/89  Pulse: 61 60  Resp: 17 17  Temp: 36.5 C   SpO2:      Last Pain:  Vitals:   01/10/19 0725  TempSrc: Oral  PainSc:                  Effie Berkshire

## 2019-01-10 NOTE — Addendum Note (Signed)
Addendum  created 01/10/19 0813 by Effie Berkshire, MD   SmartForm saved

## 2019-01-11 LAB — CBC
HCT: 27.1 % — ABNORMAL LOW (ref 39.0–52.0)
Hemoglobin: 8.8 g/dL — ABNORMAL LOW (ref 13.0–17.0)
MCH: 30.9 pg (ref 26.0–34.0)
MCHC: 32.5 g/dL (ref 30.0–36.0)
MCV: 95.1 fL (ref 80.0–100.0)
Platelets: 257 10*3/uL (ref 150–400)
RBC: 2.85 MIL/uL — ABNORMAL LOW (ref 4.22–5.81)
RDW: 13.2 % (ref 11.5–15.5)
WBC: 5.2 10*3/uL (ref 4.0–10.5)
nRBC: 0 % (ref 0.0–0.2)

## 2019-01-11 LAB — PROTIME-INR
INR: 1.5 — ABNORMAL HIGH (ref 0.8–1.2)
Prothrombin Time: 17.6 seconds — ABNORMAL HIGH (ref 11.4–15.2)

## 2019-01-11 LAB — HEPARIN LEVEL (UNFRACTIONATED): Heparin Unfractionated: 0.42 IU/mL (ref 0.30–0.70)

## 2019-01-11 MED ORDER — WARFARIN SODIUM 5 MG PO TABS
5.0000 mg | ORAL_TABLET | Freq: Once | ORAL | Status: AC
  Administered 2019-01-11: 5 mg via ORAL
  Filled 2019-01-11: qty 1

## 2019-01-11 MED ORDER — DIPHENHYDRAMINE HCL 50 MG/ML IJ SOLN
INTRAMUSCULAR | Status: AC
  Filled 2019-01-11: qty 1

## 2019-01-11 NOTE — Progress Notes (Signed)
Patient ID: James [name], male   DOB: [birth year], 43 y.o.   MRN: [id number]  [facility] Progress Note   Assessment/ Plan:   #  Prolonged dialysis dependence Acute kidney Injury:  - 2/2 ATN. Hx rhabdo.  Recovery may be slow and protracted based on severity of injury.  First HD was 4/7.  Tunneled catheter placed 4/20 (IR); s/p L RC AVF VVS Early 4/27 - Continues to be dialysis dependent BUT UOP is excellent - HD is per TTS schedule.  Once he is ready for discharge, he has a spot at [facility] 2nd shift TTS at 11:45 with arrival 11:00 am first treatment   # Bradycardia HR in 50-60s CTM  # HTN: follow with UF/probing EDW  #  Volume overload: optimize with HD  #  Bipolar disorder/anxiety: management per primary service.  #  History of alcohol abuse: initial ultrasound suggestive of early development of cirrhosis (suspect fatty liver has a contributory effect).   # Hyponatremia: resolved  # MSSA bacteremia:  oncefazolin.  Status post catheter holiday as above. Last blood cultures NGTD   #  Right IJ thrombus: heparin gtt peri-operatively, need to restart warfarin and waiting for INR >2  # Anemia - secondary in part to AKI.  aranesp - first dose 4/23.  No acute indication for PRBC's; Fe levels with HD tomorrow    Subjective:    No interval events  INR 1.5    Objective:   BP 140/89   Pulse 61   Temp 98.4 F (36.9 C) (Oral)   Resp 16   Ht 6\' 3"  (1.905 m)   Wt 107.5 kg   SpO2 97%   BMI 29.62 kg/m   Intake/Output Summary (Last 24 hours) at 01/11/2019 1133 Last data filed at 01/11/2019 0900 Gross per 24 hour  Intake 710 ml  Output 2825 ml  Net -2115 ml   Weight change: -0.7 kg  Physical Exam: Gen: Adult male lying in bed in no distress CVS: RRR; no rub Resp: Clear to auscultation and normal work of breathing Abd: Soft, obese, nontender Ext: 1+ lower extremity edema bilaterally Neuro: alert and oriented x 3 conversant and provides  history GU no foley Access: right IJ tunneled catheter; L RC AVF +B   Imaging: No results found.  Labs: BMET Recent Labs  Lab 01/05/19 0311 01/06/19 0215 01/07/19 0225 01/08/19 0246 01/09/19 0500 01/10/19 0745  NA 133* 134* 134* 137 139 139  K 4.8 4.4 4.2 4.0 4.2 4.0  CL 97* 99 98 99 102 103  CO2 25 26 24 26 27 24   GLUCOSE 77 75 78 78 82 76  BUN 42* 28* 36* 23* 30* 38*  CREATININE 6.99* 5.38* 6.63* 4.74* 5.39* 5.52*  CALCIUM 8.6* 8.5* 8.6* 8.7* 8.9 9.2  PHOS 6.0* 4.8* 5.7* 4.7*  --  6.6*   CBC Recent Labs  Lab 01/08/19 0246 01/09/19 0500 01/10/19 0745 01/11/19 0226  WBC 6.7 5.7 7.5 5.2  HGB 8.4* 8.7* 8.8* 8.8*  HCT 25.7* 26.7* 27.6* 27.1*  MCV 94.1 95.0 96.5 95.1  PLT 284 292 291 257    Medications:    . amLODipine  10 mg Oral Daily  . Chlorhexidine Gluconate Cloth  6 each Topical Q0600  . [START ON 01/12/2019] darbepoetin (ARANESP) injection - DIALYSIS  100 mcg Intravenous Q Thu-HD  . feeding supplement (ENSURE ENLIVE)  237 mL Oral BID BM  . FLUoxetine  40 mg Oral Daily  . folic acid  1 mg Oral  Daily  . loxapine  25 mg Oral Daily  . loxapine  50 mg Oral QHS  . polyethylene glycol  17 g Oral Daily  . risperiDONE  9 mg Oral QHS  . senna-docusate  1 tablet Oral BID  . thiamine  100 mg Oral Daily  . topiramate  250 mg Oral QHS  . traZODone  300 mg Oral QHS  . warfarin  5 mg Oral ONCE-1800  . Warfarin - Pharmacist Dosing Inpatient   Does not apply q1800   Rexene Agent 01/11/2019

## 2019-01-11 NOTE — Progress Notes (Signed)
ANTICOAGULATION CONSULT NOTE - Follow Up Consult  Pharmacy Consult for Heparin, Warfarin Indication: DVT (IJ thrombus)  No Known Allergies  Patient Measurements: Height: 6\' 3"  (190.5 cm) Weight: 237 lb (107.5 kg) IBW/kg (Calculated) : 84.5 Heparin Dosing Weight: 110 kg  Vital Signs: Temp: 98.4 F (36.9 C) (04/29 0544) Temp Source: Oral (04/29 0544) BP: 141/88 (04/29 0544) Pulse Rate: 61 (04/29 0544)  Labs: Recent Labs    01/09/19 0500  01/10/19 0745 01/10/19 0808 01/10/19 1620 01/11/19 0226  HGB 8.7*  --  8.8*  --   --  8.8*  HCT 26.7*  --  27.6*  --   --  27.1*  PLT 292  --  291  --   --  257  LABPROT 18.5*  --   --  18.1*  --  17.6*  INR 1.6*  --   --  1.5*  --  1.5*  HEPARINUNFRC 0.56   < >  --  0.72* 0.61 0.42  CREATININE 5.39*  --  5.52*  --   --   --    < > = values in this interval not displayed.    Estimated Creatinine Clearance: 22.4 mL/min (A) (by C-G formula based on SCr of 5.52 mg/dL (H)).  Assessment: [name] on heparin for IJ thrombus. S/p AVF placement with heparin bridge to coumadin.  Heparin level therapeutic at 0.42  INR 1.5 again today.   Goal of Therapy:  INR 2-3 Heparin level 0.3-0.7 units/ml Monitor platelets by anticoagulation protocol: Yes   Plan:  Continue heparin at 2400 units / hr Warfarin 5mg  PO x 1 Daily INR  Tahra Hitzeman A. Levada Dy, PharmD, [place] Please utilize Amion for appropriate phone number to reach the unit pharmacist ([place])      01/11/2019 8:23 AM

## 2019-01-11 NOTE — Progress Notes (Signed)
PROGRESS NOTE  James [name] XIP:[id number] DOB: 07-Nov-1975 DOA: 12/19/2018 PCP: No primary care provider on file.   LOS: 23 days   Patient is from: home  Brief Narrative / Interim history: 43 year old male with history of hypertension, diabetes mellitus type 2, bipolar disorder presented on 12/17/2018 to [facility] after being found down for 2 days following binge alcohol drinking. He was found to have AKI, metabolic acidosis, leukocytosis and LFT elevation with rhabdomyolysis, CK of 58,000. He was treated with aggressive IV fluids but unfortunately his renal function got worse with decreased urine output and CK rose to 130,000. No improvement with IV diuresis. He was transferred to [facility] on 12/19/2018. Nephrology was consulted. Tunneled HD catheter was inserted and hemodialysis was started on 12/20/2018. HD catheter subsequently removed 12/27/2018. Patient also noted to have a MSSA bacteremia.  Subjective: No major events overnight of this morning.  No complaints.  Denies chest pain, dyspnea or abdominal pain.   Assessment & Plan: Principal Problem:   MSSA bacteremia Active Problems:   Diabetes mellitus ([place])   Bipolar affective disorder ([place])   ARF (acute renal failure) ([place])   Alcoholism ([place])   AKI (acute kidney injury) ([place])   Hemodialysis catheter infection ([place])   Traumatic rhabdomyolysis ([place])   Thrombosis of right internal jugular vein ([place])  Acute renal failure secondary to traumatic rhabdomyolysis/ATN/polysubstance abuse -Started on HD via hemodialysis catheter on 12/20/2018 -Status post left RC aVF 01/09/2019 -Nephrology managing -Vascular surgery on board  Volume overload: Likely due to renal failure -Manage fluid by HD  Right IJ venous thrombosis -On warfarin with heparin bridge -INR subtherapeutic  MSSA bacteremia: No endocarditis -Continue Ancef with HD per ID recommendation  History of bipolar disorder with anxiety: Stable  -Continue home SSRI, risperidone, Topamax and trazodone  Substance use disorder/alcohol use disorder: Stable -Status post Librium taper  Well-controlled NIDDM-2: A1c 5.2%.  CBGs has been under 100. -Discontinue SSI and CBG monitoring  Hypertension: Normotensive -Continue Lopressor and Norvasc.  Scheduled Meds: . amLODipine  10 mg Oral Daily  . Chlorhexidine Gluconate Cloth  6 each Topical Q0600  . [START ON 01/12/2019] darbepoetin (ARANESP) injection - DIALYSIS  100 mcg Intravenous Q Thu-HD  . feeding supplement (ENSURE ENLIVE)  237 mL Oral BID BM  . FLUoxetine  40 mg Oral Daily  . folic acid  1 mg Oral Daily  . loxapine  25 mg Oral Daily  . loxapine  50 mg Oral QHS  . polyethylene glycol  17 g Oral Daily  . risperiDONE  9 mg Oral QHS  . senna-docusate  1 tablet Oral BID  . thiamine  100 mg Oral Daily  . topiramate  250 mg Oral QHS  . traZODone  300 mg Oral QHS  . warfarin  5 mg Oral ONCE-1800  . Warfarin - Pharmacist Dosing Inpatient   Does not apply q1800   Continuous Infusions: . sodium chloride    . sodium chloride    . sodium chloride 500 mL (01/09/19 0935)  .  ceFAZolin (ANCEF) IV 2 g (01/10/19 1711)  . heparin 2,400 Units/hr (01/11/19 1312)   PRN Meds:.sodium chloride, sodium chloride, sodium chloride, acetaminophen, alteplase, bisacodyl, heparin, hydrALAZINE, lidocaine (PF), lidocaine-prilocaine, [DISCONTINUED] ondansetron **OR** ondansetron (ZOFRAN) IV, oxyCODONE, pentafluoroprop-tetrafluoroeth, sodium chloride flush, sodium chloride flush   DVT prophylaxis: Warfarin with heparin bridge Code Status: Full code Family Communication: None at bedside Disposition Plan: Remains inpatient pending therapeutic INR for right IJ thrombosis  Consultants:   Nephrology  Vascular surgery  Procedures:   Left RC aVF on 4/27  Microbiology: . None  Antimicrobials:  None  Objective: Vitals:   01/11/19 0544 01/11/19 0607 01/11/19 0839 01/11/19 1242  BP: (!) 141/88   140/89 (!) 143/76  Pulse: 61   68  Resp:    16  Temp: 98.4 F (36.9 C)   98.1 F (36.7 C)  TempSrc: Oral   Oral  SpO2: 97%   95%  Weight:  107.5 kg    Height:        Intake/Output Summary (Last 24 hours) at 01/11/2019 1359 Last data filed at 01/11/2019 1313 Gross per 24 hour  Intake 590 ml  Output 2675 ml  Net -2085 ml   Filed Weights   01/10/19 0720 01/10/19 1125 01/11/19 0607  Weight: 111.6 kg 108.9 kg 107.5 kg    Examination:  GENERAL: No acute distress.  Appears well.  HEENT: MMM.  Vision and hearing grossly intact.  NECK: Supple.  No JVD.  LUNGS:  No IWOB. Good air movement bilaterally. HEART:  RRR. Heart sounds normal.  AVF over left forearm with good bruit's ABD: Bowel sounds present. Soft. Non tender.  MSK/EXT:  Moves all extremities. No apparent deformity. No edema bilaterally.  SKIN: no apparent skin lesion or wound NEURO: Awake, alert and oriented appropriately.  No gross deficit.  PSYCH: Calm. Normal affect.    Data Reviewed: I have independently reviewed following labs and imaging studies  CBC: Recent Labs  Lab 01/07/19 0225 01/08/19 0246 01/09/19 0500 01/10/19 0745 01/11/19 0226  WBC 6.0 6.7 5.7 7.5 5.2  HGB 8.2* 8.4* 8.7* 8.8* 8.8*  HCT 25.5* 25.7* 26.7* 27.6* 27.1*  MCV 94.1 94.1 95.0 96.5 95.1  PLT 314 284 292 291 818   Basic Metabolic Panel: Recent Labs  Lab 01/05/19 0311 01/06/19 0215 01/07/19 0225 01/08/19 0246 01/09/19 0500 01/10/19 0745  NA 133* 134* 134* 137 139 139  K 4.8 4.4 4.2 4.0 4.2 4.0  CL 97* 99 98 99 102 103  CO2 25 26 24 26 27 24   GLUCOSE 77 75 78 78 82 76  BUN 42* 28* 36* 23* 30* 38*  CREATININE 6.99* 5.38* 6.63* 4.74* 5.39* 5.52*  CALCIUM 8.6* 8.5* 8.6* 8.7* 8.9 9.2  PHOS 6.0* 4.8* 5.7* 4.7*  --  6.6*   GFR: Estimated Creatinine Clearance: 22.4 mL/min (A) (by C-G formula based on SCr of 5.52 mg/dL (H)). Liver Function Tests: Recent Labs  Lab 01/06/19 0215 01/07/19 0225 01/08/19 0246 01/09/19 0500  01/10/19 0745  AST  --   --   --  19 15  ALT  --   --   --  <5 <5  ALKPHOS  --   --   --  51 55  BILITOT  --   --   --  0.6 0.6  PROT  --   --   --  5.7* 6.2*  ALBUMIN 2.5* 2.5* 2.6* 2.7* 3.0*   No results for input(s): LIPASE, AMYLASE in the last 168 hours. No results for input(s): AMMONIA in the last 168 hours. Coagulation Profile: Recent Labs  Lab 01/07/19 0225 01/08/19 0246 01/09/19 0500 01/10/19 0808 01/11/19 0226  INR 2.9* 1.9* 1.6* 1.5* 1.5*   Cardiac Enzymes: No results for input(s): CKTOTAL, CKMB, CKMBINDEX, TROPONINI in the last 168 hours. BNP (last 3 results) No results for input(s): PROBNP in the last 8760 hours. HbA1C: No results for input(s): HGBA1C in the last 72 hours. CBG: Recent Labs  Lab 01/09/19 1202  GLUCAP 71  Lipid Profile: No results for input(s): CHOL, HDL, LDLCALC, TRIG, CHOLHDL, LDLDIRECT in the last 72 hours. Thyroid Function Tests: No results for input(s): TSH, T4TOTAL, FREET4, T3FREE, THYROIDAB in the last 72 hours. Anemia Panel: Recent Labs    01/10/19 0808  FERRITIN 129   Urine analysis:    Component Value Date/Time   COLORURINE AMBER (A) 12/25/2018 1644   APPEARANCEUR CLOUDY (A) 12/25/2018 1644   LABSPEC 1.013 12/25/2018 1644   PHURINE 7.0 12/25/2018 1644   GLUCOSEU NEGATIVE 12/25/2018 1644   HGBUR LARGE (A) 12/25/2018 1644   BILIRUBINUR NEGATIVE 12/25/2018 1644   KETONESUR NEGATIVE 12/25/2018 1644   PROTEINUR 100 (A) 12/25/2018 1644   UROBILINOGEN 1.0 05/11/2014 0921   NITRITE NEGATIVE 12/25/2018 1644   LEUKOCYTESUR MODERATE (A) 12/25/2018 1644   Sepsis Labs: Invalid input(s): PROCALCITONIN, LACTICIDVEN  No results found for this or any previous visit (from the past 240 hour(s)).    Radiology Studies: No results found.   Merel Santoli T. [facility] Pager [phone]  If 7PM-7AM, please contact night-coverage [email or web address] Password [facility] 01/11/2019, 1:59 PM

## 2019-01-12 ENCOUNTER — Inpatient Hospital Stay: Payer: Medicare HMO | Admitting: Primary Care

## 2019-01-12 LAB — CBC
HCT: 25.9 % — ABNORMAL LOW (ref 39.0–52.0)
Hemoglobin: 8.4 g/dL — ABNORMAL LOW (ref 13.0–17.0)
MCH: 30.9 pg (ref 26.0–34.0)
MCHC: 32.4 g/dL (ref 30.0–36.0)
MCV: 95.2 fL (ref 80.0–100.0)
Platelets: 253 10*3/uL (ref 150–400)
RBC: 2.72 MIL/uL — ABNORMAL LOW (ref 4.22–5.81)
RDW: 13.4 % (ref 11.5–15.5)
WBC: 5.4 10*3/uL (ref 4.0–10.5)
nRBC: 0 % (ref 0.0–0.2)

## 2019-01-12 LAB — RENAL FUNCTION PANEL
Albumin: 3.1 g/dL — ABNORMAL LOW (ref 3.5–5.0)
Anion gap: 11 (ref 5–15)
BUN: 36 mg/dL — ABNORMAL HIGH (ref 6–20)
CO2: 26 mmol/L (ref 22–32)
Calcium: 9.1 mg/dL (ref 8.9–10.3)
Chloride: 103 mmol/L (ref 98–111)
Creatinine, Ser: 3.81 mg/dL — ABNORMAL HIGH (ref 0.61–1.24)
GFR calc Af Amer: 21 mL/min — ABNORMAL LOW (ref >60)
GFR calc non Af Amer: 18 mL/min — ABNORMAL LOW (ref >60)
Glucose, Bld: 86 mg/dL (ref 70–99)
Phosphorus: 5.8 mg/dL — ABNORMAL HIGH (ref 2.5–4.6)
Potassium: 4.1 mmol/L (ref 3.5–5.1)
Sodium: 140 mmol/L (ref 135–145)

## 2019-01-12 LAB — HEPARIN LEVEL (UNFRACTIONATED): Heparin Unfractionated: 0.59 IU/mL (ref 0.30–0.70)

## 2019-01-12 LAB — PROTIME-INR
INR: 1.3 — ABNORMAL HIGH (ref 0.8–1.2)
Prothrombin Time: 16.4 seconds — ABNORMAL HIGH (ref 11.4–15.2)

## 2019-01-12 MED ORDER — WARFARIN SODIUM 7.5 MG PO TABS
7.5000 mg | ORAL_TABLET | Freq: Once | ORAL | Status: AC
  Administered 2019-01-12: 7.5 mg via ORAL
  Filled 2019-01-12: qty 1

## 2019-01-12 MED ORDER — DARBEPOETIN ALFA 100 MCG/0.5ML IJ SOSY
PREFILLED_SYRINGE | INTRAMUSCULAR | Status: AC
  Administered 2019-01-12: 100 ug via INTRAVENOUS
  Filled 2019-01-12: qty 0.5

## 2019-01-12 MED ORDER — HEPARIN SODIUM (PORCINE) 1000 UNIT/ML IJ SOLN
INTRAMUSCULAR | Status: AC
  Filled 2019-01-12: qty 4

## 2019-01-12 NOTE — Progress Notes (Signed)
PROGRESS NOTE  James [name] [name] DOB: [birth year] DOA: 12/19/2018 PCP: No primary care provider on file.   LOS: 24 days   Patient is from: home  Brief Narrative / Interim history: 43 year old male with history of hypertension, diabetes mellitus type 2, bipolar disorder presented on 12/17/2018 to [facility] after being found down for 2 days following binge alcohol drinking. He was found to have AKI, metabolic acidosis, leukocytosis and LFT elevation with rhabdomyolysis, CK of 58,000. He was treated with aggressive IV fluids but unfortunately his renal function got worse with decreased urine output and CK rose to 130,000. No improvement with IV diuresis. He was transferred to [facility] on 12/19/2018. Nephrology was consulted. Tunneled HD catheter was inserted and hemodialysis was started on 12/20/2018. HD catheter subsequently removed 12/27/2018. Patient also noted to have a MSSA bacteremia for which he also started on Ancef.  Subjective: No major events overnight of this morning.  No complaints this morning.  Denies chest pain, dyspnea or abdominal pain.  Excellent urine output.  INR remains subtherapeutic at 1.3.  Assessment & Plan: Principal Problem:   MSSA bacteremia Active Problems:   Diabetes mellitus ([place])   Bipolar affective disorder ([place])   ARF (acute renal failure) ([place])   Alcoholism ([place])   AKI (acute kidney injury) ([place])   Hemodialysis catheter infection ([place])   Traumatic rhabdomyolysis ([place])   Thrombosis of right internal jugular vein ([facility])  Acute renal failure secondary to traumatic rhabdomyolysis/ATN/polysubstance abuse -About 3.4 L urine output in the last 24-hour. -Started on HD via hemodialysis catheter on 12/20/2018 -Status post left RC aVF 01/09/2019 -Nephrology managing -Vascular surgery on board  Volume overload: Likely due to renal failure -Manage fluid by HD -Good urine output as well.  Right IJ venous thrombosis -On  warfarin with heparin bridge -INR subtherapeutic  MSSA bacteremia: No endocarditis -Continue Ancef with HD per ID recommendation  History of bipolar disorder with anxiety: Stable -Continue home SSRI, risperidone, Topamax and trazodone  Substance use disorder/alcohol use disorder: Stable -Status post Librium taper  Well-controlled NIDDM-2: A1c 5.2%.  CBGs has been under 100. -Discontinued SSI and CBG monitoring on 4/29  Hypertension: Normotensive -Continue Lopressor and Norvasc.  Scheduled Meds: . amLODipine  10 mg Oral Daily  . Chlorhexidine Gluconate Cloth  6 each Topical Q0600  . darbepoetin (ARANESP) injection - DIALYSIS  100 mcg Intravenous Q Thu-HD  . feeding supplement (ENSURE ENLIVE)  237 mL Oral BID BM  . FLUoxetine  40 mg Oral Daily  . folic acid  1 mg Oral Daily  . heparin      . loxapine  25 mg Oral Daily  . loxapine  50 mg Oral QHS  . polyethylene glycol  17 g Oral Daily  . risperiDONE  9 mg Oral QHS  . senna-docusate  1 tablet Oral BID  . thiamine  100 mg Oral Daily  . topiramate  250 mg Oral QHS  . traZODone  300 mg Oral QHS  . warfarin  7.5 mg Oral ONCE-1800  . Warfarin - Pharmacist Dosing Inpatient   Does not apply q1800   Continuous Infusions: . sodium chloride    . sodium chloride    . sodium chloride 500 mL (01/09/19 0935)  .  ceFAZolin (ANCEF) IV 2 g (01/10/19 1711)  . heparin 2,400 Units/hr (01/12/19 1037)   PRN Meds:.sodium chloride, sodium chloride, sodium chloride, acetaminophen, alteplase, bisacodyl, heparin, hydrALAZINE, lidocaine (PF), lidocaine-prilocaine, [DISCONTINUED] ondansetron **OR** ondansetron (ZOFRAN) IV, oxyCODONE, pentafluoroprop-tetrafluoroeth, sodium chloride flush, sodium  chloride flush   DVT prophylaxis: Warfarin with heparin bridge Code Status: Full code Family Communication: None at bedside Disposition Plan: Remains inpatient pending therapeutic INR for right IJ thrombosis.  Final disposition home.  Patient will have  outpatient HD TTS at [facility] when discharged.  Consultants:   Nephrology  Vascular surgery  Procedures:   Left RC aVF on 4/27  Microbiology: . None  Antimicrobials:  None  Objective: Vitals:   01/12/19 1030 01/12/19 1100 01/12/19 1130 01/12/19 1200  BP: 132/62 (!) 158/88 (!) 153/92 (!) 153/92  Pulse: (!) 47 62 (!) 57 (!) 57  Resp:      Temp:      TempSrc:      SpO2:      Weight:      Height:        Intake/Output Summary (Last 24 hours) at 01/12/2019 1248 Last data filed at 01/12/2019 0747 Gross per 24 hour  Intake 1425.53 ml  Output 3355 ml  Net -1929.47 ml   Filed Weights   01/11/19 0607 01/12/19 0600 01/12/19 0840  Weight: 107.5 kg 106.6 kg 106.4 kg    Examination:  GENERAL: No acute distress.  Appears well.  HEENT: MMM.  Vision and hearing grossly intact.  NECK: Supple.  LUNGS:  No IWOB. Good air movement bilaterally. HEART:  RRR. Heart sounds normal.  ABD: Bowel sounds present. Soft. Non tender.  MSK/EXT:  Moves all extremities. No apparent deformity. No edema bilaterally.  SKIN: no apparent skin lesion or wound NEURO: Awake, alert and oriented appropriately.  No gross deficit.  PSYCH: Calm. Normal affect.  AVF over left forearm with good bruit's  Data Reviewed: I have independently reviewed following labs and imaging studies  CBC: Recent Labs  Lab 01/08/19 0246 01/09/19 0500 01/10/19 0745 01/11/19 0226 01/12/19 0304  WBC 6.7 5.7 7.5 5.2 5.4  HGB 8.4* 8.7* 8.8* 8.8* 8.4*  HCT 25.7* 26.7* 27.6* 27.1* 25.9*  MCV 94.1 95.0 96.5 95.1 95.2  PLT 284 292 291 257 834   Basic Metabolic Panel: Recent Labs  Lab 01/06/19 0215 01/07/19 0225 01/08/19 0246 01/09/19 0500 01/10/19 0745 01/12/19 0828  NA 134* 134* 137 139 139 140  K 4.4 4.2 4.0 4.2 4.0 4.1  CL 99 98 99 102 103 103  CO2 26 24 26 27 24 26   GLUCOSE 75 78 78 82 76 86  BUN 28* 36* 23* 30* 38* 36*  CREATININE 5.38* 6.63* 4.74* 5.39* 5.52* 3.81*  CALCIUM 8.5* 8.6* 8.7*  8.9 9.2 9.1  PHOS 4.8* 5.7* 4.7*  --  6.6* 5.8*   GFR: Estimated Creatinine Clearance: 32.3 mL/min (A) (by C-G formula based on SCr of 3.81 mg/dL (H)). Liver Function Tests: Recent Labs  Lab 01/07/19 0225 01/08/19 0246 01/09/19 0500 01/10/19 0745 01/12/19 0828  AST  --   --  19 15  --   ALT  --   --  <5 <5  --   ALKPHOS  --   --  51 55  --   BILITOT  --   --  0.6 0.6  --   PROT  --   --  5.7* 6.2*  --   ALBUMIN 2.5* 2.6* 2.7* 3.0* 3.1*   No results for input(s): LIPASE, AMYLASE in the last 168 hours. No results for input(s): AMMONIA in the last 168 hours. Coagulation Profile: Recent Labs  Lab 01/08/19 0246 01/09/19 0500 01/10/19 0808 01/11/19 0226 01/12/19 0304  INR 1.9* 1.6* 1.5* 1.5* 1.3*   Cardiac Enzymes:  No results for input(s): CKTOTAL, CKMB, CKMBINDEX, TROPONINI in the last 168 hours. BNP (last 3 results) No results for input(s): PROBNP in the last 8760 hours. HbA1C: No results for input(s): HGBA1C in the last 72 hours. CBG: Recent Labs  Lab 01/09/19 1202  GLUCAP 71   Lipid Profile: No results for input(s): CHOL, HDL, LDLCALC, TRIG, CHOLHDL, LDLDIRECT in the last 72 hours. Thyroid Function Tests: No results for input(s): TSH, T4TOTAL, FREET4, T3FREE, THYROIDAB in the last 72 hours. Anemia Panel: Recent Labs    01/10/19 0808  FERRITIN 129   Urine analysis:    Component Value Date/Time   COLORURINE AMBER (A) 12/25/2018 1644   APPEARANCEUR CLOUDY (A) 12/25/2018 1644   LABSPEC 1.013 12/25/2018 1644   PHURINE 7.0 12/25/2018 1644   GLUCOSEU NEGATIVE 12/25/2018 1644   HGBUR LARGE (A) 12/25/2018 1644   BILIRUBINUR NEGATIVE 12/25/2018 1644   KETONESUR NEGATIVE 12/25/2018 1644   PROTEINUR 100 (A) 12/25/2018 1644   UROBILINOGEN 1.0 05/11/2014 0921   NITRITE NEGATIVE 12/25/2018 1644   LEUKOCYTESUR MODERATE (A) 12/25/2018 1644   Sepsis Labs: Invalid input(s): PROCALCITONIN, LACTICIDVEN  No results found for this or any previous visit (from the past  240 hour(s)).    Radiology Studies: No results found.   Taye T. [facility] Pager [phone]  If 7PM-7AM, please contact night-coverage [email or web address] Password [facility] 01/12/2019, 12:48 PM

## 2019-01-12 NOTE — Procedures (Signed)
I was present at this dialysis session. I have reviewed the session itself and made appropriate changes.   INR 1.3.  2L UF goal. TDC Qb 400. Doing well. 2K.  Next HD 5/2   Filed Weights   01/11/19 0607 01/12/19 0600 01/12/19 0840  Weight: 107.5 kg 106.6 kg 106.4 kg    Recent Labs  Lab 01/12/19 0828  NA 140  K 4.1  CL 103  CO2 26  GLUCOSE 86  BUN 36*  CREATININE 3.81*  CALCIUM 9.1  PHOS 5.8*    Recent Labs  Lab 01/10/19 0745 01/11/19 0226 01/12/19 0304  WBC 7.5 5.2 5.4  HGB 8.8* 8.8* 8.4*  HCT 27.6* 27.1* 25.9*  MCV 96.5 95.1 95.2  PLT 291 257 253    Scheduled Meds: . amLODipine  10 mg Oral Daily  . Chlorhexidine Gluconate Cloth  6 each Topical Q0600  . darbepoetin (ARANESP) injection - DIALYSIS  100 mcg Intravenous Q Thu-HD  . feeding supplement (ENSURE ENLIVE)  237 mL Oral BID BM  . FLUoxetine  40 mg Oral Daily  . folic acid  1 mg Oral Daily  . heparin      . loxapine  25 mg Oral Daily  . loxapine  50 mg Oral QHS  . polyethylene glycol  17 g Oral Daily  . risperiDONE  9 mg Oral QHS  . senna-docusate  1 tablet Oral BID  . thiamine  100 mg Oral Daily  . topiramate  250 mg Oral QHS  . traZODone  300 mg Oral QHS  . warfarin  7.5 mg Oral ONCE-1800  . Warfarin - Pharmacist Dosing Inpatient   Does not apply q1800   Continuous Infusions: . sodium chloride    . sodium chloride    . sodium chloride 500 mL (01/09/19 0935)  .  ceFAZolin (ANCEF) IV 2 g (01/10/19 1711)  . heparin 2,400 Units/hr (01/12/19 1037)   PRN Meds:.sodium chloride, sodium chloride, sodium chloride, acetaminophen, alteplase, bisacodyl, heparin, hydrALAZINE, lidocaine (PF), lidocaine-prilocaine, [DISCONTINUED] ondansetron **OR** ondansetron (ZOFRAN) IV, oxyCODONE, pentafluoroprop-tetrafluoroeth, sodium chloride flush, sodium chloride flush   [name]  MD 01/12/2019, 12:47 PM

## 2019-01-12 NOTE — Progress Notes (Signed)
ANTICOAGULATION CONSULT NOTE - Follow Up Consult  Pharmacy Consult for Heparin, Warfarin Indication: DVT (IJ thrombus)  No Known Allergies  Patient Measurements: Height: 6\' 3"  (190.5 cm) Weight: 235 lb 1.6 oz (106.6 kg) IBW/kg (Calculated) : 84.5 Heparin Dosing Weight: 110 kg  Vital Signs: Temp: 98.2 F (36.8 C) (04/30 0554) Temp Source: Oral (04/30 0554) BP: 140/85 (04/30 0554) Pulse Rate: 54 (04/30 0554)  Labs: Recent Labs    01/10/19 0745 01/10/19 0808 01/10/19 1620 01/11/19 0226 01/12/19 0304 01/12/19 0828  HGB 8.8*  --   --  8.8* 8.4*  --   HCT 27.6*  --   --  27.1* 25.9*  --   PLT 291  --   --  257 253  --   LABPROT  --  18.1*  --  17.6* 16.4*  --   INR  --  1.5*  --  1.5* 1.3*  --   HEPARINUNFRC  --  0.72* 0.61 0.42 0.59  --   CREATININE 5.52*  --   --   --   --  3.81*    Estimated Creatinine Clearance: 32.3 mL/min (A) (by C-G formula based on SCr of 3.81 mg/dL (H)).  Assessment: [name] on heparin for IJ thrombus. S/p AVF placement with heparin bridge to coumadin.  Heparin level therapeutic at 0.59  INR 1.3  Goal of Therapy:  INR 2-3 Heparin level 0.3-0.7 units/ml Monitor platelets by anticoagulation protocol: Yes   Plan:  Continue heparin at 2400 units / hr Warfarin 7.5mg  PO x 1 Daily INR  Thank you Anette Guarneri, PharmD [phone] Please utilize Amion for appropriate phone number to reach the unit pharmacist ([place])      01/12/2019 10:49 AM

## 2019-01-12 NOTE — Care Management Important Message (Signed)
Important Message  Patient Details  Name: James [name] MRN: [id number] Date of Birth: 12-Oct-1975   Medicare Important Message Given:  Yes    [name] 01/12/2019, 2:23 PM

## 2019-01-13 LAB — PROTIME-INR
INR: 1.3 — ABNORMAL HIGH (ref 0.8–1.2)
Prothrombin Time: 16.2 seconds — ABNORMAL HIGH (ref 11.4–15.2)

## 2019-01-13 LAB — HEPARIN LEVEL (UNFRACTIONATED): Heparin Unfractionated: 0.43 IU/mL (ref 0.30–0.70)

## 2019-01-13 MED ORDER — WARFARIN SODIUM 5 MG PO TABS
10.0000 mg | ORAL_TABLET | Freq: Once | ORAL | Status: AC
  Administered 2019-01-13: 10 mg via ORAL
  Filled 2019-01-13: qty 2

## 2019-01-13 NOTE — Progress Notes (Signed)
PROGRESS NOTE  James [name] OEV:[id number] DOB: 1976/03/23 DOA: 12/19/2018 PCP: No primary care provider on file.   LOS: 25 days   Patient is from: home  Brief Narrative / Interim history: 43 year old male with history of hypertension, diabetes mellitus type 2, bipolar disorder presented on 12/17/2018 to [facility] after being found down for 2 days following binge alcohol drinking. He was found to have AKI, metabolic acidosis, leukocytosis and LFT elevation with rhabdomyolysis, CK of 58,000. He was treated with aggressive IV fluids but unfortunately his renal function got worse with decreased urine output and CK rose to 130,000. No improvement with IV diuresis. He was transferred to [facility] on 12/19/2018. Nephrology was consulted. Tunneled HD catheter was inserted and hemodialysis was started on 12/20/2018. HD catheter subsequently removed 12/27/2018. Patient also noted to have a MSSA bacteremia for which he also started on Ancef.  Subjective: No major events overnight of this morning.  No complaint this morning.  Denies chest pain, dyspnea or abdominal pain.  Continues to have good urine output.  Very eager to go home but INR remains subtherapeutic at 1.3.  Assessment & Plan: Principal Problem:   MSSA bacteremia Active Problems:   Diabetes mellitus ([place])   Bipolar affective disorder ([place])   ARF (acute renal failure) ([place])   Alcoholism ([place])   AKI (acute kidney injury) ([place])   Hemodialysis catheter infection ([place])   Traumatic rhabdomyolysis ([place])   Thrombosis of right internal jugular vein ([place])  Acute renal failure secondary to traumatic rhabdomyolysis/ATN/polysubstance abuse -Continues to have good urine output. -HD TTS-started on 12/20/2018. -S/p left RC aVF 01/09/2019 -Nephrology managing -Has spot for outpatient HD at [facility].  Volume overload: Likely due to renal failure -Manage fluid by HD -Good urine output as well.  Right IJ  venous thrombosis -On warfarin with heparin bridge -INR subtherapeutic -He could be a candidate of NOAC down the road if he comes off HD.  MSSA bacteremia: No endocarditis -Ancef with HD for 4 weeks through 01/25/2019 per ID recommendation  History of bipolar disorder with anxiety: Stable -Continue home SSRI, risperidone, Topamax and trazodone  Substance use disorder/alcohol use disorder: Stable -Status post Librium taper  Well-controlled NIDDM-2: A1c 5.2%.  CBGs has been under 100. -Discontinued SSI and CBG monitoring on 4/29  Hypertension: Normotensive -Continue Lopressor and Norvasc.  Scheduled Meds: . amLODipine  10 mg Oral Daily  . Chlorhexidine Gluconate Cloth  6 each Topical Q0600  . darbepoetin (ARANESP) injection - DIALYSIS  100 mcg Intravenous Q Thu-HD  . feeding supplement (ENSURE ENLIVE)  237 mL Oral BID BM  . FLUoxetine  40 mg Oral Daily  . folic acid  1 mg Oral Daily  . loxapine  25 mg Oral Daily  . loxapine  50 mg Oral QHS  . polyethylene glycol  17 g Oral Daily  . risperiDONE  9 mg Oral QHS  . senna-docusate  1 tablet Oral BID  . thiamine  100 mg Oral Daily  . topiramate  250 mg Oral QHS  . traZODone  300 mg Oral QHS  . warfarin  10 mg Oral ONCE-1800  . Warfarin - Pharmacist Dosing Inpatient   Does not apply q1800   Continuous Infusions: . sodium chloride    . sodium chloride    . sodium chloride 500 mL (01/09/19 0935)  .  ceFAZolin (ANCEF) IV 2 g (01/12/19 2038)  . heparin 2,400 Units/hr (01/13/19 0842)   PRN Meds:.sodium chloride, sodium chloride, sodium chloride, acetaminophen, alteplase,  bisacodyl, heparin, hydrALAZINE, lidocaine (PF), lidocaine-prilocaine, [DISCONTINUED] ondansetron **OR** ondansetron (ZOFRAN) IV, oxyCODONE, pentafluoroprop-tetrafluoroeth, sodium chloride flush, sodium chloride flush   DVT prophylaxis: Warfarin with heparin bridge Code Status: Full code Family Communication: None at bedside Disposition Plan: Remains inpatient  pending therapeutic INR for right IJ thrombosis.  Final disposition home.  Patient will have outpatient HD TTS at [facility] when discharged.  Consultants:   Nephrology  Vascular surgery  Procedures:   Left RC aVF on 4/27  Microbiology: . None  Antimicrobials: Anti-infectives (From admission, onward)   Start     Dose/Rate Route Frequency Ordered Stop   01/05/19 1800  ceFAZolin (ANCEF) IVPB 2g/100 mL premix     2 g 200 mL/hr over 30 Minutes Intravenous Every T-Th-Sa (1800) 01/05/19 1301 01/24/19 2359   01/03/19 1800  ceFAZolin (ANCEF) IVPB 1 g/50 mL premix  Status:  Discontinued     1 g 100 mL/hr over 30 Minutes Intravenous Every 24 hours 01/02/19 1405 01/05/19 1300   01/02/19 1215  ceFAZolin (ANCEF) IVPB 2g/100 mL premix     2 g 200 mL/hr over 30 Minutes Intravenous To Radiology 01/02/19 1206 01/02/19 1250   12/31/18 1030  ceFAZolin (ANCEF) IVPB 1 g/50 mL premix     1 g 100 mL/hr over 30 Minutes Intravenous  Once 12/31/18 1029 12/31/18 1200   12/27/18 1800  ceFAZolin (ANCEF) IVPB 1 g/50 mL premix  Status:  Discontinued     1 g 100 mL/hr over 30 Minutes Intravenous Every 24 hours 12/27/18 1152 01/02/19 1405   12/26/18 1200  ceFAZolin (ANCEF) IVPB 2g/100 mL premix     2 g 200 mL/hr over 30 Minutes Intravenous Every M-W-F (Hemodialysis) 12/26/18 0253 12/26/18 1609   12/26/18 0330  ceFAZolin (ANCEF) IVPB 2g/100 mL premix     2 g 200 mL/hr over 30 Minutes Intravenous  Once 12/26/18 0253 12/26/18 0413       Objective: Vitals:   01/12/19 2122 01/13/19 0500 01/13/19 0543 01/13/19 1301  BP: 135/75  (!) 152/77 (!) 145/79  Pulse: (!) 59  (!) 55 80  Resp: 16  16 18   Temp: 98.6 F (37 C)  98.3 F (36.8 C) 98.4 F (36.9 C)  TempSrc: Oral  Oral Oral  SpO2: 96%  99% 97%  Weight:  104.2 kg    Height:        Intake/Output Summary (Last 24 hours) at 01/13/2019 1303 Last data filed at 01/13/2019 1244 Gross per 24 hour  Intake 762.53 ml  Output 3700 ml  Net -2937.47  ml   Filed Weights   01/12/19 0840 01/12/19 1305 01/13/19 0500  Weight: 106.4 kg 104.3 kg 104.2 kg    Examination:  GENERAL: No acute distress.  Appears well.  HEENT: MMM.  Vision and hearing grossly intact.  NECK: Supple.  No apparent JVD. LUNGS:  No IWOB. Good air movement bilaterally. HEART:  RRR. Heart sounds normal.  ABD: Bowel sounds present. Soft. Non tender.  MSK/EXT:  Moves all extremities. No apparent deformity. No edema bilaterally.  SKIN: Small skin bruises over bilateral knees.  No signs of infection. NEURO: Awake, alert and oriented appropriately.  No gross deficit.  PSYCH: Calm. Normal affect.  AVF over left forearm with good bruit's  Data Reviewed: I have independently reviewed following labs and imaging studies  CBC: Recent Labs  Lab 01/08/19 0246 01/09/19 0500 01/10/19 0745 01/11/19 0226 01/12/19 0304  WBC 6.7 5.7 7.5 5.2 5.4  HGB 8.4* 8.7* 8.8* 8.8* 8.4*  HCT 25.7*  26.7* 27.6* 27.1* 25.9*  MCV 94.1 95.0 96.5 95.1 95.2  PLT 284 292 291 257 465   Basic Metabolic Panel: Recent Labs  Lab 01/07/19 0225 01/08/19 0246 01/09/19 0500 01/10/19 0745 01/12/19 0828 01/13/19 0202  NA 134* 137 139 139 140 139  K 4.2 4.0 4.2 4.0 4.1 3.4*  CL 98 99 102 103 103 101  CO2 24 26 27 24 26 26   GLUCOSE 78 78 82 76 86 83  BUN 36* 23* 30* 38* 36* 22*  CREATININE 6.63* 4.74* 5.39* 5.52* 3.81* 2.77*  CALCIUM 8.6* 8.7* 8.9 9.2 9.1 8.9  PHOS 5.7* 4.7*  --  6.6* 5.8* 4.5   GFR: Estimated Creatinine Clearance: 44 mL/min (A) (by C-G formula based on SCr of 2.77 mg/dL (H)). Liver Function Tests: Recent Labs  Lab 01/08/19 0246 01/09/19 0500 01/10/19 0745 01/12/19 0828 01/13/19 0202  AST  --  19 15  --   --   ALT  --  <5 <5  --   --   ALKPHOS  --  51 55  --   --   BILITOT  --  0.6 0.6  --   --   PROT  --  5.7* 6.2*  --   --   ALBUMIN 2.6* 2.7* 3.0* 3.1* 3.1*   No results for input(s): LIPASE, AMYLASE in the last 168 hours. No results for input(s): AMMONIA in  the last 168 hours. Coagulation Profile: Recent Labs  Lab 01/09/19 0500 01/10/19 0808 01/11/19 0226 01/12/19 0304 01/13/19 0202  INR 1.6* 1.5* 1.5* 1.3* 1.3*   Cardiac Enzymes: No results for input(s): CKTOTAL, CKMB, CKMBINDEX, TROPONINI in the last 168 hours. BNP (last 3 results) No results for input(s): PROBNP in the last 8760 hours. HbA1C: No results for input(s): HGBA1C in the last 72 hours. CBG: Recent Labs  Lab 01/09/19 1202  GLUCAP 71   Lipid Profile: No results for input(s): CHOL, HDL, LDLCALC, TRIG, CHOLHDL, LDLDIRECT in the last 72 hours. Thyroid Function Tests: No results for input(s): TSH, T4TOTAL, FREET4, T3FREE, THYROIDAB in the last 72 hours. Anemia Panel: No results for input(s): VITAMINB12, FOLATE, FERRITIN, TIBC, IRON, RETICCTPCT in the last 72 hours. Urine analysis:    Component Value Date/Time   COLORURINE AMBER (A) 12/25/2018 1644   APPEARANCEUR CLOUDY (A) 12/25/2018 1644   LABSPEC 1.013 12/25/2018 1644   PHURINE 7.0 12/25/2018 1644   GLUCOSEU NEGATIVE 12/25/2018 1644   HGBUR LARGE (A) 12/25/2018 1644   BILIRUBINUR NEGATIVE 12/25/2018 1644   KETONESUR NEGATIVE 12/25/2018 1644   PROTEINUR 100 (A) 12/25/2018 1644   UROBILINOGEN 1.0 05/11/2014 0921   NITRITE NEGATIVE 12/25/2018 1644   LEUKOCYTESUR MODERATE (A) 12/25/2018 1644   Sepsis Labs: Invalid input(s): PROCALCITONIN, LACTICIDVEN  No results found for this or any previous visit (from the past 240 hour(s)).    Radiology Studies: No results found.   James [name] T. [facility] Pager [phone]  If 7PM-7AM, please contact night-coverage [email or web address] Password [email or web address] 01/13/2019, 1:03 PM

## 2019-01-13 NOTE — Progress Notes (Signed)
Patient ID: James [name], male   DOB: 12-Feb-1976, 43 y.o.   MRN: [id number] [facility] Progress Note   Assessment/ Plan:   #  Prolonged dialysis dependence Acute kidney Injury:  - 2/2 ATN. Hx rhabdo.  Recovery may be slow and protracted based on severity of injury.  First HD was 4/7.  Tunneled catheter placed 4/20 (IR); s/p L RC AVF VVS Early 4/27 - Continues to be dialysis dependent BUT UOP is excellent - Check AM labs tomorrow to assess for recovery of GFR - HD is per TTS schedule.  Once he is ready for discharge, he has a spot at [facility] 2nd shift TTS at 11:45 with arrival 11:00 am first treatment   # Bradycardia HR in 50-60s CTM  # HTN: follow with UF/probing EDW  #  Volume overload: optimize with HD  #  Bipolar disorder/anxiety: management per primary service.  #  History of alcohol abuse: initial ultrasound suggestive of early development of cirrhosis (suspect fatty liver has a contributory effect).   # Hyponatremia: resolved  # MSSA bacteremia:  oncefazolin.  Status post catheter holiday as above. Last blood cultures NGTD   #  Right IJ thrombus: heparin gtt peri-operatively,restarted warfarin and waiting for INR >2  # Anemia - secondary in part to AKI.  aranesp - first dose 4/23.  No acute indication for PRBC's;    Subjective:    No interval events  INR 1.3    Objective:   BP (!) 152/77 (BP Location: Right Arm)   Pulse (!) 55   Temp 98.3 F (36.8 C) (Oral)   Resp 16   Ht 6\' 3"  (1.905 m)   Wt 104.2 kg   SpO2 99%   BMI 28.71 kg/m   Intake/Output Summary (Last 24 hours) at 01/13/2019 1223 Last data filed at 01/13/2019 0700 Gross per 24 hour  Intake 762.53 ml  Output 3250 ml  Net -2487.47 ml   Weight change: -0.241 kg  Physical Exam: Gen: Adult male lying in bed in no distress CVS: RRR; no rub Resp: Clear to auscultation and normal work of breathing Abd: Soft, obese, nontender Ext: no lower extremity edema  bilaterally Neuro: alert and oriented x 3 conversant and provides history GU no foley Access: right IJ tunneled catheter; L RC AVF +B   Imaging: No results found.  Labs: BMET Recent Labs  Lab 01/07/19 0225 01/08/19 0246 01/09/19 0500 01/10/19 0745 01/12/19 0828 01/13/19 0202  NA 134* 137 139 139 140 139  K 4.2 4.0 4.2 4.0 4.1 3.4*  CL 98 99 102 103 103 101  CO2 24 26 27 24 26 26   GLUCOSE 78 78 82 76 86 83  BUN 36* 23* 30* 38* 36* 22*  CREATININE 6.63* 4.74* 5.39* 5.52* 3.81* 2.77*  CALCIUM 8.6* 8.7* 8.9 9.2 9.1 8.9  PHOS 5.7* 4.7*  --  6.6* 5.8* 4.5   CBC Recent Labs  Lab 01/09/19 0500 01/10/19 0745 01/11/19 0226 01/12/19 0304  WBC 5.7 7.5 5.2 5.4  HGB 8.7* 8.8* 8.8* 8.4*  HCT 26.7* 27.6* 27.1* 25.9*  MCV 95.0 96.5 95.1 95.2  PLT 292 291 257 253    Medications:    . amLODipine  10 mg Oral Daily  . Chlorhexidine Gluconate Cloth  6 each Topical Q0600  . darbepoetin (ARANESP) injection - DIALYSIS  100 mcg Intravenous Q Thu-HD  . feeding supplement (ENSURE ENLIVE)  237 mL Oral BID BM  . FLUoxetine  40 mg Oral Daily  .  folic acid  1 mg Oral Daily  . loxapine  25 mg Oral Daily  . loxapine  50 mg Oral QHS  . polyethylene glycol  17 g Oral Daily  . risperiDONE  9 mg Oral QHS  . senna-docusate  1 tablet Oral BID  . thiamine  100 mg Oral Daily  . topiramate  250 mg Oral QHS  . traZODone  300 mg Oral QHS  . warfarin  10 mg Oral ONCE-1800  . Warfarin - Pharmacist Dosing Inpatient   Does not apply [facility] 01/13/2019

## 2019-01-13 NOTE — Progress Notes (Signed)
NCM spoke with patient , he states he does not want a rolling walker, states he can walk.

## 2019-01-13 NOTE — Progress Notes (Signed)
ANTICOAGULATION CONSULT NOTE - Follow Up Consult  Pharmacy Consult for Heparin, Warfarin Indication: DVT (IJ thrombus)  No Known Allergies  Patient Measurements: Height: 6\' 3"  (190.5 cm) Weight: 229 lb 11.5 oz (104.2 kg) IBW/kg (Calculated) : 84.5 Heparin Dosing Weight: 110 kg  Vital Signs: Temp: 98.3 F (36.8 C) (05/01 0543) Temp Source: Oral (05/01 0543) BP: 152/77 (05/01 0543) Pulse Rate: 55 (05/01 0543)  Labs: Recent Labs    01/11/19 0226 01/12/19 0304 01/12/19 0828 01/13/19 0202  HGB 8.8* 8.4*  --   --   HCT 27.1* 25.9*  --   --   PLT 257 253  --   --   LABPROT 17.6* 16.4*  --  16.2*  INR 1.5* 1.3*  --  1.3*  HEPARINUNFRC 0.42 0.59  --  0.43  CREATININE  --   --  3.81* 2.77*    Estimated Creatinine Clearance: 44 mL/min (A) (by C-G formula based on SCr of 2.77 mg/dL (H)).  Assessment: [name] on heparin for IJ thrombus. S/p AVF placement with heparin bridge to coumadin.  Heparin level therapeutic   INR 1.3 again (no change)  Goal of Therapy:  INR 2-3 Heparin level 0.3-0.7 units/ml Monitor platelets by anticoagulation protocol: Yes   Plan:  Continue heparin at 2400 units / hr Warfarin 10 mg po x 1 dose tonight Daily INR  Thank you Anette Guarneri, PharmD [phone] Please utilize Amion for appropriate phone number to reach the unit pharmacist ([place])      01/13/2019 9:26 AM

## 2019-01-14 LAB — PROTIME-INR
INR: 1.5 — ABNORMAL HIGH (ref 0.8–1.2)
Prothrombin Time: 17.8 seconds — ABNORMAL HIGH (ref 11.4–15.2)

## 2019-01-14 LAB — RENAL FUNCTION PANEL
Albumin: 3.1 g/dL — ABNORMAL LOW (ref 3.5–5.0)
Albumin: 3.2 g/dL — ABNORMAL LOW (ref 3.5–5.0)
Anion gap: 12 (ref 5–15)
Anion gap: 10 (ref 5–15)
BUN: 22 mg/dL — ABNORMAL HIGH (ref 6–20)
BUN: 29 mg/dL — ABNORMAL HIGH (ref 6–20)
CO2: 26 mmol/L (ref 22–32)
CO2: 26 mmol/L (ref 22–32)
Calcium: 8.9 mg/dL (ref 8.9–10.3)
Calcium: 9.2 mg/dL (ref 8.9–10.3)
Chloride: 101 mmol/L (ref 98–111)
Chloride: 102 mmol/L (ref 98–111)
Creatinine, Ser: 2.77 mg/dL — ABNORMAL HIGH (ref 0.61–1.24)
Creatinine, Ser: 2.94 mg/dL — ABNORMAL HIGH (ref 0.61–1.24)
GFR calc Af Amer: 31 mL/min — ABNORMAL LOW (ref >60)
GFR calc Af Amer: 28 mL/min — ABNORMAL LOW (ref >60)
GFR calc non Af Amer: 26 mL/min — ABNORMAL LOW (ref >60)
GFR calc non Af Amer: 25 mL/min — ABNORMAL LOW (ref >60)
Glucose, Bld: 83 mg/dL (ref 70–99)
Glucose, Bld: 82 mg/dL (ref 70–99)
Phosphorus: 4.5 mg/dL (ref 2.5–4.6)
Phosphorus: 5.2 mg/dL — ABNORMAL HIGH (ref 2.5–4.6)
Potassium: 3.4 mmol/L — ABNORMAL LOW (ref 3.5–5.1)
Potassium: 3.4 mmol/L — ABNORMAL LOW (ref 3.5–5.1)
Sodium: 139 mmol/L (ref 135–145)
Sodium: 138 mmol/L (ref 135–145)

## 2019-01-14 LAB — CBC
HCT: 27.8 % — ABNORMAL LOW (ref 39.0–52.0)
Hemoglobin: 9.1 g/dL — ABNORMAL LOW (ref 13.0–17.0)
MCH: 31.3 pg (ref 26.0–34.0)
MCHC: 32.7 g/dL (ref 30.0–36.0)
MCV: 95.5 fL (ref 80.0–100.0)
Platelets: 217 10*3/uL (ref 150–400)
RBC: 2.91 MIL/uL — ABNORMAL LOW (ref 4.22–5.81)
RDW: 13.4 % (ref 11.5–15.5)
WBC: 5.4 10*3/uL (ref 4.0–10.5)
nRBC: 0 % (ref 0.0–0.2)

## 2019-01-14 LAB — HEPARIN LEVEL (UNFRACTIONATED): Heparin Unfractionated: 0.49 IU/mL (ref 0.30–0.70)

## 2019-01-14 MED ORDER — HEPARIN SODIUM (PORCINE) 1000 UNIT/ML IJ SOLN
INTRAMUSCULAR | Status: AC
  Administered 2019-01-14: 3800 [IU] via INTRAVENOUS_CENTRAL
  Filled 2019-01-14: qty 4

## 2019-01-14 MED ORDER — WARFARIN SODIUM 5 MG PO TABS
10.0000 mg | ORAL_TABLET | Freq: Once | ORAL | Status: AC
  Administered 2019-01-14: 10 mg via ORAL
  Filled 2019-01-14: qty 2
  Filled 2019-01-14: qty 1

## 2019-01-14 NOTE — Plan of Care (Signed)

## 2019-01-14 NOTE — Progress Notes (Signed)
ANTICOAGULATION CONSULT NOTE - Follow Up Consult  Pharmacy Consult for Heparin, Warfarin Indication: DVT (IJ thrombus)  No Known Allergies  Patient Measurements: Height: 6\' 3"  (190.5 cm) Weight: 224 lb 3.3 oz (101.7 kg) IBW/kg (Calculated) : 84.5 Heparin Dosing Weight: 110 kg  Vital Signs: Temp: 98.1 F (36.7 C) (05/02 0640) Temp Source: Oral (05/02 0640) BP: 140/82 (05/02 0730) Pulse Rate: 61 (05/02 0730)  Labs: Recent Labs    01/12/19 0304 01/12/19 0828 01/13/19 0202 01/14/19 0226  HGB 8.4*  --   --  9.1*  HCT 25.9*  --   --  27.8*  PLT 253  --   --  217  LABPROT 16.4*  --  16.2* 17.8*  INR 1.3*  --  1.3* 1.5*  HEPARINUNFRC 0.59  --  0.43 0.49  CREATININE  --  3.81* 2.77* 2.94*    Estimated Creatinine Clearance: 41 mL/min (A) (by C-G formula based on SCr of 2.94 mg/dL (H)).  Assessment: [name] on heparin for IJ thrombus. S/p AVF placement with heparin bridge to coumadin.  Heparin level therapeutic at 0.49 this AM on 2400 units/hr. INR subtherapeutic at 1.5. Hgb improved to 9.1, plts ok, no reports of bleeding.    Goal of Therapy:  INR 2-3 Heparin level 0.3-0.7 units/ml Monitor platelets by anticoagulation protocol: Yes   Plan:  Continue heparin at 2400 units / hr Warfarin 10 mg po again tonight Daily INR, heparin level, CBC, s/sx bleeding   Juanell Fairly, PharmD PGY1 Pharmacy Resident 01/14/2019 7:52 AM Please utilize Amion for appropriate phone number to reach the unit pharmacist ([place])

## 2019-01-14 NOTE — Procedures (Signed)
I was present at this dialysis session. I have reviewed the session itself and made appropriate changes.   UOP 1.9L yesterday.  Change in SCR 2.77 to 2.94, K 3.4.    INR 1.5.    WIll use 72h HD gap to monitor for GFR recovery. I expect him to recover in time.   4K bath, UF goal 3L. BP stable.  TDC Qb 400.    Filed Weights   01/13/19 0500 01/14/19 0612 01/14/19 0640  Weight: 104.2 kg 101.7 kg 101.7 kg    Recent Labs  Lab 01/14/19 0226  NA 138  K 3.4*  CL 102  CO2 26  GLUCOSE 82  BUN 29*  CREATININE 2.94*  CALCIUM 9.2  PHOS 5.2*    Recent Labs  Lab 01/11/19 0226 01/12/19 0304 01/14/19 0226  WBC 5.2 5.4 5.4  HGB 8.8* 8.4* 9.1*  HCT 27.1* 25.9* 27.8*  MCV 95.1 95.2 95.5  PLT 257 253 217    Scheduled Meds: . amLODipine  10 mg Oral Daily  . Chlorhexidine Gluconate Cloth  6 each Topical Q0600  . darbepoetin (ARANESP) injection - DIALYSIS  100 mcg Intravenous Q Thu-HD  . feeding supplement (ENSURE ENLIVE)  237 mL Oral BID BM  . FLUoxetine  40 mg Oral Daily  . folic acid  1 mg Oral Daily  . loxapine  25 mg Oral Daily  . loxapine  50 mg Oral QHS  . polyethylene glycol  17 g Oral Daily  . risperiDONE  9 mg Oral QHS  . senna-docusate  1 tablet Oral BID  . thiamine  100 mg Oral Daily  . topiramate  250 mg Oral QHS  . traZODone  300 mg Oral QHS  . warfarin  10 mg Oral ONCE-1800  . Warfarin - Pharmacist Dosing Inpatient   Does not apply q1800   Continuous Infusions: . sodium chloride    . sodium chloride    . sodium chloride 500 mL (01/09/19 0935)  .  ceFAZolin (ANCEF) IV 2 g (01/12/19 2038)  . heparin 2,400 Units/hr (01/14/19 0541)   PRN Meds:.sodium chloride, sodium chloride, sodium chloride, acetaminophen, alteplase, bisacodyl, heparin, hydrALAZINE, lidocaine (PF), lidocaine-prilocaine, [DISCONTINUED] ondansetron **OR** ondansetron (ZOFRAN) IV, oxyCODONE, pentafluoroprop-tetrafluoroeth, sodium chloride flush, sodium chloride flush   [name]  MD 01/14/2019,  8:02 AM

## 2019-01-14 NOTE — Progress Notes (Signed)
PROGRESS NOTE  James [name] OLM:[id number] DOB: 03/22/1976 DOA: 12/19/2018 PCP: No primary care provider on file.   LOS: 26 days   Patient is from: home  Brief Narrative / Interim history: 43 year old male with history of hypertension, diabetes mellitus type 2, bipolar disorder presented on 12/17/2018 to [facility] after being found down for 2 days following binge alcohol drinking. He was found to have AKI, metabolic acidosis, leukocytosis and LFT elevation with rhabdomyolysis, CK of 58,000. He was treated with aggressive IV fluids but unfortunately his renal function got worse with decreased urine output and CK rose to 130,000. No improvement with IV diuresis. He was transferred to [facility] on 12/19/2018. Nephrology was consulted. Tunneled HD catheter was inserted and hemodialysis was started on 12/20/2018. HD catheter subsequently removed 12/27/2018. Patient also noted to have a MSSA bacteremia for which he also started on Ancef.  Subjective: Major events overnight of this morning.  No complaint this morning.  Denies chest pain, dyspnea or abdominal pain.  Continues to have good urine output.  INR remains subtherapeutic at 1.5.  Assessment & Plan: Principal Problem:   MSSA bacteremia Active Problems:   Diabetes mellitus ([place])   Bipolar affective disorder ([place])   ARF (acute renal failure) ([place])   Alcoholism ([place])   AKI (acute kidney injury) ([place])   Hemodialysis catheter infection ([place])   Traumatic rhabdomyolysis ([place])   Thrombosis of right internal jugular vein ([facility])  Acute renal failure secondary to traumatic rhabdomyolysis/ATN/polysubstance abuse -Continues to have good urine output but creatinine remains elevated. -HD TTS-started on 12/20/2018. -S/p left RC aVF 01/09/2019 -Nephrology managing -Has spot for outpatient HD at [facility] kidney center.  Mild hypokalemia -Correct with HD.  Volume overload: Euvolemic -Manage fluid by HD -Good urine output  as well.  Right IJ venous thrombosis -On warfarin with heparin bridge -INR subtherapeutic -He could be a candidate of NOAC down the road if he comes off HD.  MSSA bacteremia: No endocarditis -Ancef with HD for 4 weeks through 01/25/2019 per ID recommendation  History of bipolar disorder with anxiety: Stable -Continue home SSRI, risperidone, Topamax and trazodone  Substance use disorder/alcohol use disorder: Stable -Status post Librium taper  Well-controlled NIDDM-2: A1c 5.2%.  CBGs has been under 100. -Discontinued SSI and CBG monitoring on 4/29  Hypertension: Normotensive -Continue Lopressor and Norvasc.  Scheduled Meds: . amLODipine  10 mg Oral Daily  . Chlorhexidine Gluconate Cloth  6 each Topical Q0600  . darbepoetin (ARANESP) injection - DIALYSIS  100 mcg Intravenous Q Thu-HD  . feeding supplement (ENSURE ENLIVE)  237 mL Oral BID BM  . FLUoxetine  40 mg Oral Daily  . folic acid  1 mg Oral Daily  . loxapine  25 mg Oral Daily  . loxapine  50 mg Oral QHS  . polyethylene glycol  17 g Oral Daily  . risperiDONE  9 mg Oral QHS  . senna-docusate  1 tablet Oral BID  . thiamine  100 mg Oral Daily  . topiramate  250 mg Oral QHS  . traZODone  300 mg Oral QHS  . warfarin  10 mg Oral ONCE-1800  . Warfarin - Pharmacist Dosing Inpatient   Does not apply q1800   Continuous Infusions: . sodium chloride    . sodium chloride    . sodium chloride 500 mL (01/09/19 0935)  .  ceFAZolin (ANCEF) IV 2 g (01/14/19 1000)  . heparin 2,400 Units/hr (01/14/19 0541)   PRN Meds:.sodium chloride, sodium chloride, sodium chloride, acetaminophen, alteplase, bisacodyl,  heparin, hydrALAZINE, lidocaine (PF), lidocaine-prilocaine, [DISCONTINUED] ondansetron **OR** ondansetron (ZOFRAN) IV, oxyCODONE, pentafluoroprop-tetrafluoroeth, sodium chloride flush, sodium chloride flush   DVT prophylaxis: Warfarin with heparin bridge Code Status: Full code Family Communication: None at bedside Disposition Plan:  Remains inpatient pending therapeutic INR for right IJ thrombosis.  Final disposition home.  Patient will have outpatient HD TTS at [place] [facility] when discharged.  Consultants:   Nephrology  Vascular surgery  Procedures:   Left RC aVF on 4/27  Microbiology: . None  Antimicrobials: Anti-infectives (From admission, onward)   Start     Dose/Rate Route Frequency Ordered Stop   01/05/19 1800  ceFAZolin (ANCEF) IVPB 2g/100 mL premix     2 g 200 mL/hr over 30 Minutes Intravenous Every T-Th-Sa (1800) 01/05/19 1301 01/24/19 2359   01/03/19 1800  ceFAZolin (ANCEF) IVPB 1 g/50 mL premix  Status:  Discontinued     1 g 100 mL/hr over 30 Minutes Intravenous Every 24 hours 01/02/19 1405 01/05/19 1300   01/02/19 1215  ceFAZolin (ANCEF) IVPB 2g/100 mL premix     2 g 200 mL/hr over 30 Minutes Intravenous To Radiology 01/02/19 1206 01/02/19 1250   12/31/18 1030  ceFAZolin (ANCEF) IVPB 1 g/50 mL premix     1 g 100 mL/hr over 30 Minutes Intravenous  Once 12/31/18 1029 12/31/18 1200   12/27/18 1800  ceFAZolin (ANCEF) IVPB 1 g/50 mL premix  Status:  Discontinued     1 g 100 mL/hr over 30 Minutes Intravenous Every 24 hours 12/27/18 1152 01/02/19 1405   12/26/18 1200  ceFAZolin (ANCEF) IVPB 2g/100 mL premix     2 g 200 mL/hr over 30 Minutes Intravenous Every M-W-F (Hemodialysis) 12/26/18 0253 12/26/18 1609   12/26/18 0330  ceFAZolin (ANCEF) IVPB 2g/100 mL premix     2 g 200 mL/hr over 30 Minutes Intravenous  Once 12/26/18 0253 12/26/18 0413      Objective: Vitals:   01/14/19 0930 01/14/19 1000 01/14/19 1030 01/14/19 1046  BP: (!) 143/95 (!) 143/89 (!) 144/95 133/84  Pulse: 71 64 66 64  Resp:    16  Temp:    98.2 F (36.8 C)  TempSrc:    Oral  SpO2:    95%  Weight:    98.4 kg  Height:        Intake/Output Summary (Last 24 hours) at 01/14/2019 1345 Last data filed at 01/14/2019 1046 Gross per 24 hour  Intake 528 ml  Output 4450 ml  Net -3922 ml   Filed Weights   01/14/19 0612  01/14/19 0640 01/14/19 1046  Weight: 101.7 kg 101.7 kg 98.4 kg    Examination:  GENERAL: No acute distress.  Appears well.  HEENT: MMM.  Vision and hearing grossly intact.  NECK: Supple.  No JVD.  LUNGS:  No IWOB. Good air movement bilaterally. HEART:  RRR. Heart sounds normal.  ABD: Bowel sounds present. Soft. Non tender.  MSK/EXT:  Moves all extremities. No apparent deformity. No edema bilaterally.  SKIN: Small skin bruises over bilateral knees.  No signs of infection.  Healing. NEURO: Awake, alert and oriented appropriately.  No gross deficit.  PSYCH: Calm. Normal affect. AVF over left forearm with good bruit's  Data Reviewed: I have independently reviewed following labs and imaging studies  CBC: Recent Labs  Lab 01/09/19 0500 01/10/19 0745 01/11/19 0226 01/12/19 0304 01/14/19 0226  WBC 5.7 7.5 5.2 5.4 5.4  HGB 8.7* 8.8* 8.8* 8.4* 9.1*  HCT 26.7* 27.6* 27.1* 25.9* 27.8*  MCV 95.0 96.5  95.1 95.2 95.5  PLT 292 291 257 253 194   Basic Metabolic Panel: Recent Labs  Lab 01/08/19 0246 01/09/19 0500 01/10/19 0745 01/12/19 0828 01/13/19 0202 01/14/19 0226  NA 137 139 139 140 139 138  K 4.0 4.2 4.0 4.1 3.4* 3.4*  CL 99 102 103 103 101 102  CO2 26 27 24 26 26 26   GLUCOSE 78 82 76 86 83 82  BUN 23* 30* 38* 36* 22* 29*  CREATININE 4.74* 5.39* 5.52* 3.81* 2.77* 2.94*  CALCIUM 8.7* 8.9 9.2 9.1 8.9 9.2  PHOS 4.7*  --  6.6* 5.8* 4.5 5.2*   GFR: Estimated Creatinine Clearance: 37.9 mL/min (A) (by C-G formula based on SCr of 2.94 mg/dL (H)). Liver Function Tests: Recent Labs  Lab 01/09/19 0500 01/10/19 0745 01/12/19 0828 01/13/19 0202 01/14/19 0226  AST 19 15  --   --   --   ALT <5 <5  --   --   --   ALKPHOS 51 55  --   --   --   BILITOT 0.6 0.6  --   --   --   PROT 5.7* 6.2*  --   --   --   ALBUMIN 2.7* 3.0* 3.1* 3.1* 3.2*   No results for input(s): LIPASE, AMYLASE in the last 168 hours. No results for input(s): AMMONIA in the last 168 hours. Coagulation  Profile: Recent Labs  Lab 01/10/19 0808 01/11/19 0226 01/12/19 0304 01/13/19 0202 01/14/19 0226  INR 1.5* 1.5* 1.3* 1.3* 1.5*   Cardiac Enzymes: No results for input(s): CKTOTAL, CKMB, CKMBINDEX, TROPONINI in the last 168 hours. BNP (last 3 results) No results for input(s): PROBNP in the last 8760 hours. HbA1C: No results for input(s): HGBA1C in the last 72 hours. CBG: Recent Labs  Lab 01/09/19 1202  GLUCAP 71   Lipid Profile: No results for input(s): CHOL, HDL, LDLCALC, TRIG, CHOLHDL, LDLDIRECT in the last 72 hours. Thyroid Function Tests: No results for input(s): TSH, T4TOTAL, FREET4, T3FREE, THYROIDAB in the last 72 hours. Anemia Panel: No results for input(s): VITAMINB12, FOLATE, FERRITIN, TIBC, IRON, RETICCTPCT in the last 72 hours. Urine analysis:    Component Value Date/Time   COLORURINE AMBER (A) 12/25/2018 1644   APPEARANCEUR CLOUDY (A) 12/25/2018 1644   LABSPEC 1.013 12/25/2018 1644   PHURINE 7.0 12/25/2018 1644   GLUCOSEU NEGATIVE 12/25/2018 1644   HGBUR LARGE (A) 12/25/2018 1644   BILIRUBINUR NEGATIVE 12/25/2018 1644   KETONESUR NEGATIVE 12/25/2018 1644   PROTEINUR 100 (A) 12/25/2018 1644   UROBILINOGEN 1.0 05/11/2014 0921   NITRITE NEGATIVE 12/25/2018 1644   LEUKOCYTESUR MODERATE (A) 12/25/2018 1644   Sepsis Labs: Invalid input(s): PROCALCITONIN, LACTICIDVEN  No results found for this or any previous visit (from the past 240 hour(s)).    Radiology Studies: No results found.    T. [facility] Pager [phone]  If 7PM-7AM, please contact night-coverage [email or web address] Password [email or web address] 01/14/2019, 1:45 PM

## 2019-01-15 LAB — RENAL FUNCTION PANEL
Albumin: 3.5 g/dL (ref 3.5–5.0)
Anion gap: 14 (ref 5–15)
BUN: 17 mg/dL (ref 6–20)
CO2: 25 mmol/L (ref 22–32)
Calcium: 9.6 mg/dL (ref 8.9–10.3)
Chloride: 98 mmol/L (ref 98–111)
Creatinine, Ser: 2.43 mg/dL — ABNORMAL HIGH (ref 0.61–1.24)
GFR calc Af Amer: 36 mL/min — ABNORMAL LOW (ref >60)
GFR calc non Af Amer: 31 mL/min — ABNORMAL LOW (ref >60)
Glucose, Bld: 87 mg/dL (ref 70–99)
Phosphorus: 4.6 mg/dL (ref 2.5–4.6)
Potassium: 3.7 mmol/L (ref 3.5–5.1)
Sodium: 137 mmol/L (ref 135–145)

## 2019-01-15 LAB — PROTIME-INR
INR: 1.6 — ABNORMAL HIGH (ref 0.8–1.2)
Prothrombin Time: 19.1 seconds — ABNORMAL HIGH (ref 11.4–15.2)

## 2019-01-15 LAB — HEMOGLOBIN AND HEMATOCRIT, BLOOD
HCT: 30.3 % — ABNORMAL LOW (ref 39.0–52.0)
Hemoglobin: 10 g/dL — ABNORMAL LOW (ref 13.0–17.0)

## 2019-01-15 LAB — HEPARIN LEVEL (UNFRACTIONATED): Heparin Unfractionated: 0.57 IU/mL (ref 0.30–0.70)

## 2019-01-15 MED ORDER — THIAMINE HCL 100 MG PO TABS: 100.0000 mg | ORAL_TABLET | Freq: Every day | ORAL | 0 refills | Status: AC

## 2019-01-15 MED ORDER — AMLODIPINE BESYLATE 10 MG PO TABS: 10.0000 mg | ORAL_TABLET | Freq: Every day | ORAL | 0 refills | Status: AC

## 2019-01-15 MED ORDER — DARBEPOETIN ALFA 100 MCG/0.5ML IJ SOSY: 100.0000 ug | PREFILLED_SYRINGE | INTRAMUSCULAR | Status: AC

## 2019-01-15 MED ORDER — ENSURE ENLIVE PO LIQD: 237.0000 mL | Freq: Two times a day (BID) | ORAL | 0 refills | Status: AC

## 2019-01-15 MED ORDER — WARFARIN SODIUM 7.5 MG PO TABS: 7.5000 mg | ORAL_TABLET | Freq: Once | ORAL | Status: DC

## 2019-01-15 MED ORDER — APIXABAN 5 MG PO TABS: ORAL_TABLET | ORAL | 0 refills | Status: DC

## 2019-01-15 MED ORDER — RISPERIDONE 3 MG PO TABS: 3.0000 mg | ORAL_TABLET | Freq: Every day | ORAL | 0 refills | Status: AC

## 2019-01-15 MED ORDER — CEFAZOLIN SODIUM-DEXTROSE 2-4 GM/100ML-% IV SOLN: 2.0000 g | INTRAVENOUS | 0 refills | Status: AC

## 2019-01-15 MED ORDER — FOLIC ACID 1 MG PO TABS: 1.0000 mg | ORAL_TABLET | Freq: Every day | ORAL | 0 refills | Status: AC

## 2019-01-15 MED ORDER — APIXABAN 5 MG PO TABS
10.0000 mg | ORAL_TABLET | Freq: Two times a day (BID) | ORAL | Status: DC
  Administered 2019-01-15: 10 mg via ORAL
  Filled 2019-01-15: qty 2

## 2019-01-15 MED ORDER — TRAZODONE HCL 100 MG PO TABS: 100.0000 mg | ORAL_TABLET | Freq: Every day | ORAL | 0 refills | Status: AC

## 2019-01-15 MED ORDER — APIXABAN 5 MG PO TABS: 5.0000 mg | ORAL_TABLET | Freq: Two times a day (BID) | ORAL | Status: DC

## 2019-01-15 NOTE — Discharge Summary (Signed)
Physician Discharge Summary  James [name] Moroz NOB:[id number] DOB: 1975/09/30 DOA: 12/19/2018  PCP: No primary care provider on file.  Admit date: 12/19/2018 Discharge date: 01/15/2019  Admitted From: [facility] (hotel) Disposition: Home with mother  Recommendations for Outpatient Follow-up:  1. Follow up with PCP in 1-2 weeks 2. Please obtain CBC/BMP/Mag at follow up 3. Please follow up on the following pending results: None  Home Health: None Equipment/Devices: None  Discharge Condition: Stable CODE STATUS: Full code   Hospital Course: 43 year old male with history of hypertension, diabetes mellitus type 2, bipolar disorder presented on 12/17/2018 to [facility] after being found down for 2 days following binge alcohol drinking. He was found to have AKI, metabolic acidosis, leukocytosis and LFT elevation with rhabdomyolysis, CK of 58,000. He was treated with aggressive IV fluids but unfortunately his renal function got worse with decreased urine output and CK rose to 130,000. No improvement with IV diuresis. He was transferred to [facility] on 12/19/2018. Nephrology was consulted.  Renal ultrasound was normal. HD catheter was inserted and hemodialysis was started on 12/20/2018. HD catheter subsequently removed 12/27/2018 and converted to tunneled HD cath. Patient had left radiocephalic AVF on 9/47/6546.  Rhabdomyolysis resolved.  Patient also noted to have a MSSA bacteremia for which he also started on Ancef to complete 4 weeks course on 01/27/2019.  Hospitalization also complicated by right IJ venous thrombosis.  Initially started on warfarin with heparin bridge but INR remains subtherapeutic for long time.  Since patient has had excellent urine output despite prolonged HD dependence for AKI, not ESRD, he was transitioned to Eliquis and discharged home on Eliquis.  See individual problem list below for more.  Discharge Diagnoses:  Principal Problem:   MSSA  bacteremia Active Problems:   Diabetes mellitus ([place])   Bipolar affective disorder ([place])   ARF (acute renal failure) ([place])   Alcoholism ([place])   AKI (acute kidney injury) ([place])   Hemodialysis catheter infection ([place])   Traumatic rhabdomyolysis ([place])   Thrombosis of right internal jugular vein ([facility])  Acute renal failure secondary to traumatic rhabdomyolysis/ATN/polysubstance abuse -Continues to have good urine output but creatinine remains elevated. -HD TTS-started on 12/20/2018. -S/p left RC aVF 01/09/2019 -Has spot for outpatient HD at [facility] and will continue dialysis there.  Right IJ venous thrombosis-initially started on warfarin heparin bridge and transition to Eliquis and discharged on Eliquis.  MSSA bacteremia: No endocarditis -Ancef with HD for 4 weeks until 01/27/2019  History of bipolar disorder with anxiety: stable -Discharged on home Prozac, loxapine, topiramate and trazodone -Risperidone reduced to 3 mg at bedtime as he did not need during this hospitalization. -Recommend outpatient psych follow-up  Substance use disorder/alcohol use disorder: Stable -Cessation counseling given -Status post Librium taper -Discharged on folic acid, thiamine and Ensure  Well-controlled NIDDM-2: A1c 5.2%.  CBGs has been under 100 this hospitalization -Home metformin discontinued given AKI  Hypertension: Normotensive -Discharged on amlodipine   Discharge Instructions  Discharge Instructions    Call MD for:  difficulty breathing, headache or visual disturbances   Complete by:  As directed    Call MD for:  extreme fatigue   Complete by:  As directed    Call MD for:  persistant dizziness or light-headedness   Complete by:  As directed    Call MD for:  persistant nausea and vomiting   Complete by:  As directed    Call MD for:  redness, tenderness, or signs of infection (pain, swelling, redness, odor  or green/yellow discharge around incision site)   Complete by:   As directed    Call MD for:  severe uncontrolled pain   Complete by:  As directed    Call MD for:  temperature >100.4   Complete by:  As directed    Diet - low sodium heart healthy   Complete by:  As directed    Discharge instructions   Complete by:  As directed    It has been a pleasure taking care of you! You were admitted with rhabdomyolysis and subsequent kidney failure.  You also had bacterial infection of blood.  Your rhabdomyolysis is resolved.  You will continue dialysis on Tuesday, Thursday and Saturday in [place] per nephrology recommendation.  We will get antibiotics with dialysis for the bacterial infection.  You were also started on blood thinner for blood clot in your neck.  Please pick up your prescription from the pharmacy today and start taking as prescribed. There could be some changes to your home medication after this hospitalization.  We recommend you review medication list and the direction before you take them. Please follow-up with your primary care doctor. Go to dialysis as scheduled  Once you are discharged, your primary care physician will handle any further medical issues. Please note that NO REFILLS for any discharge medications will be authorized once you are discharged, as it is imperative that you return to your primary care physician (or establish a relationship with a primary care physician if you do not have one) for your aftercare needs so that they can reassess your need for medications and monitor your lab values. Take care,   Increase activity slowly   Complete by:  As directed      Allergies as of 01/15/2019   No Known Allergies     Medication List    STOP taking these medications   atorvastatin 40 MG tablet Commonly known as:  LIPITOR   clonazePAM 1 MG tablet Commonly known as:  KLONOPIN   gemfibrozil 600 MG tablet Commonly known as:  LOPID   metFORMIN 500 MG tablet Commonly known as:  GLUCOPHAGE     TAKE these medications     amLODipine 10 MG tablet Commonly known as:  NORVASC Take 1 tablet (10 mg total) by mouth daily. Start taking on:  Jan 16, 2019   apixaban 5 MG Tabs tablet Commonly known as:  ELIQUIS Take 2 tablets (10 mg total) by mouth 2 (two) times daily for 7 days, THEN 1 tablet (5 mg total) 2 (two) times daily for 23 days. Start taking on:  Jan 15, 2019   ceFAZolin 2-4 GM/100ML-% IVPB Commonly known as:  ANCEF Inject 100 mLs (2 g total) into the vein Every Tuesday,Thursday,and Saturday with dialysis for 10 days. Start taking on:  Jan 17, 2019   Darbepoetin Alfa 100 MCG/0.5ML Sosy injection Commonly known as:  ARANESP Inject 0.5 mLs (100 mcg total) into the vein every Thursday with hemodialysis. Start taking on:  Jan 19, 2019   feeding supplement (ENSURE ENLIVE) Liqd Take 237 mLs by mouth 2 (two) times daily between meals.   FLUoxetine 40 MG capsule Commonly known as:  PROZAC Take 40 mg by mouth every morning.   folic acid 1 MG tablet Commonly known as:  FOLVITE Take 1 tablet (1 mg total) by mouth daily. Start taking on:  Jan 16, 2019   loxapine 25 MG capsule Commonly known as:  LOXITANE Take 25 mg by mouth every morning.   loxapine 50  MG capsule Commonly known as:  LOXITANE Take 50 mg by mouth at bedtime.   risperiDONE 3 MG tablet Commonly known as:  RISPERDAL Take 1 tablet (3 mg total) by mouth at bedtime. What changed:  how much to take   thiamine 100 MG tablet Take 1 tablet (100 mg total) by mouth daily. Start taking on:  Jan 16, 2019   topiramate 100 MG tablet Commonly known as:  TOPAMAX Take 250 mg by mouth at bedtime.   traZODone 100 MG tablet Commonly known as:  DESYREL Take 1 tablet (100 mg total) by mouth at bedtime. What changed:  how much to take            Durable Medical Equipment  (From admission, onward)         Start     Ordered   12/25/18 1538  For home use only DME Walker rolling  Once    Question:  Patient needs a walker to treat with the  following condition  Answer:  Weakness   12/25/18 1537         Follow-up [facility] Follow up.   Why:  For home health servies. They will call you 1-2 days after you go home to set up your first home visit       [facility] Follow up.   Why:  will deliver a RW to your room to take home before you leave       [facility]. Go on 01/19/2019.   Why:  appointment is made for 10:30 . Please arrive 10-15 minutes early. Contact information: [address] [place] [address] [phone]       Rosetta Posner, MD In 6 weeks.   Specialties:  Vascular Surgery, Cardiology Why:  Office will call you to arrange your appt (sent) Contact information: [place] [address] [phone]           Consultations:  Nephrology  Procedures/Studies: 2D Echo: 1. The left ventricle has hyperdynamic systolic function, with an ejection fraction of >65%. The cavity size was mildly dilated. There is mild concentric left ventricular hypertrophy. Left ventricular diastolic parameters were normal.  2. The right ventricle has normal systolic function. The cavity was normal. There is no increase in right ventricular wall thickness. 3. Right atrial size was mildly dilated.  US Renal  Result Date: 12/20/2018 CLINICAL DATA:  43 year old male with a history of renal failure EXAM: RENAL / URINARY TRACT ULTRASOUND COMPLETE COMPARISON:  Ultrasound 12/18/2018, CT 10/25/2012 FINDINGS: Right Kidney: Length: 13.9 cm x 7.5 cm x 6.5 cm, 356 cc. No hydronephrosis. Relatively unremarkable echotexture of the right kidney. Flow confirmed in the hilum of the right kidney Left Kidney: Length: 13.3 cm x 7.0 cm x 8.0 cm, 391 cc. No hydronephrosis. Relatively unremarkable echotexture of the left kidney. Flow confirmed in the hilum of the left kidney. Bladder: Not visualized IMPRESSION: No hydronephrosis, with relatively unremarkable  appearance of the kidneys. Electronically Signed   By: Corrie Mckusick D.O.   On: 12/20/2018 12:53   Ir Fluoro Guide Cv Line Left  Result Date: 12/30/2018 INDICATION: 43 year old with acute renal failure. Recently removed right jugular dialysis catheter and needs new temporary access. EXAM: FLUOROSCOPIC AND ULTRASOUND GUIDED PLACEMENT OF A NON-TUNNELED DIALYSIS CATHETER Physician: Stephan Minister. Henn, MD MEDICATIONS: None ANESTHESIA/SEDATION: None FLUOROSCOPY TIME:  Fluoroscopy Time: 42 seconds, 2 mGy COMPLICATIONS: None immediate. PROCEDURE: Informed consent was obtained for catheter  placement. The patient was placed supine on the interventional table. Ultrasound demonstrated focal thrombus in the right internal jugular vein at the old catheter site. Therefore, attention was directed to the left side of the neck. Ultrasound confirmed a patent left internal jugular veinvein. Ultrasound images were obtained for documentation. The left side of the neck was prepped and draped in a sterile fashion. The left side of the neck was anesthetized with 1% lidocaine. Maximal barrier sterile technique was utilized including caps, mask, sterile gowns, sterile gloves, sterile drape, hand hygiene and skin antiseptic. A small incision was made with #11 blade scalpel. A 21 gauge needle directed into the left internal jugular vein with ultrasound guidance. A micropuncture dilator set was placed. A 20 cm Mahurkar catheter was selected. The catheter was advanced over a wire and positioned at the superior cavoatrial junction. Fluoroscopic images were obtained for documentation. Both dialysis lumens were found to aspirate and flush well. The proper amount of heparin was flushed in both lumens. The central venous lumen was flushed with normal saline. Catheter was sutured to skin. FINDINGS: Catheter tip at the superior cavoatrial junction. Focal thrombus in the right internal jugular vein likely from recent catheter. IMPRESSION: Successful  placement of a left jugular non-tunneled dialysis catheter using ultrasound and fluoroscopic guidance. Focal thrombus in the right internal jugular vein was discussed with Dr. Posey Pronto in Nephrology. Electronically Signed   By: Markus Daft M.D.   On: 12/30/2018 16:55   Ir Fluoro Guide Cv Line Right  Result Date: 01/02/2019 CLINICAL DATA:  End-stage renal disease with current non tunneled temporary dialysis catheter via the left internal jugular vein. The patient requires placement of a tunneled catheter for long-term use. EXAM: TUNNELED CENTRAL VENOUS HEMODIALYSIS CATHETER PLACEMENT WITH ULTRASOUND AND FLUOROSCOPIC GUIDANCE ANESTHESIA/SEDATION: 1.0 mg IV Versed; 50 mcg IV Fentanyl. Total Moderate Sedation Time:   23 minutes. The patient's level of consciousness and physiologic status were continuously monitored during the procedure by Radiology nursing. MEDICATIONS: 2 g IV Ancef. FLUOROSCOPY TIME:  42 seconds. 6.9 mGy. PROCEDURE: The procedure, risks, benefits, and alternatives were explained to the patient. Questions regarding the procedure were encouraged and answered. The patient understands and consents to the procedure. A timeout was performed prior to initiating the procedure. The right neck and chest were prepped with chlorhexidine in a sterile fashion, and a sterile drape was applied covering the operative field. Maximum barrier sterile technique with sterile gowns and gloves were used for the procedure. Local anesthesia was provided with 1% lidocaine. Ultrasound was performed to confirm patency of the right internal jugular vein. Image documentation was performed. After creating a small venotomy incision, a 21 gauge needle was advanced into the right internal jugular vein under direct, real-time ultrasound guidance. Ultrasound image documentation was performed. After securing guidewire access, an 8 Fr dilator was placed. A J-wire was kinked to measure appropriate catheter length. A Palindrome tunneled  hemodialysis catheter measuring 23 cm from tip to cuff was chosen for placement. This was tunneled in a retrograde fashion from the chest wall to the venotomy incision. At the venotomy, serial dilatation was performed and a 16 Fr peel-away sheath was placed over a guidewire. The catheter was then placed through the sheath and the sheath removed. Final catheter positioning was confirmed and documented with a fluoroscopic spot image. The catheter was aspirated, flushed with saline, and injected with appropriate volume heparin dwells. The venotomy incision was closed with subcutaneous 3-0 Monocryl and subcuticular 4-0 Vicryl. Dermabond was applied to the  incision. The catheter exit site was secured with 0-Prolene retention sutures. After placement of the tunneled catheter, the left-sided non tunneled catheter was removed. COMPLICATIONS: None.  No pneumothorax. FINDINGS: The right internal jugular vein demonstrates nonocclusive thrombus occupying approximately 50% of the lumen of the vein. The vein was patent enough to allow placement of a dialysis catheter. After catheter placement, the tip lies in the right atrium. The catheter aspirates normally and is ready for immediate use. IMPRESSION: Placement of tunneled hemodialysis catheter via the right internal jugular vein. The catheter tip lies in the right atrium. The catheter is ready for immediate use. Electronically Signed   By: Aletta Edouard M.D.   On: 01/02/2019 15:12   Ir Fluoro Guide Cv Line Right  Result Date: 12/20/2018 CLINICAL DATA:  Rhabdomyolysis, renal failure, needs access for hemodialysis EXAM: EXAM RIGHT IJ CATHETER PLACEMENT UNDER ULTRASOUND AND FLUOROSCOPIC GUIDANCE TECHNIQUE: The procedure, risks (including but not limited to bleeding, infection, organ damage, pneumothorax), benefits, and alternatives were explained to the patient. Questions regarding the procedure were encouraged and answered. The patient understands and consents to the  procedure. Patency of the right IJ vein was confirmed with ultrasound with image documentation. An appropriate skin site was determined. Skin site was marked. Region was prepped using maximum barrier technique including cap and mask, sterile gown, sterile gloves, large sterile sheet, and Chlorhexidine as cutaneous antisepsis. The region was infiltrated locally with 1% lidocaine. Under real-time ultrasound guidance, the right IJ vein was accessed with a 21 gauge needle; the needle tip within the vein was confirmed with ultrasound image documentation. The needle exchanged over a 018 guidewire for vascular dilator which allowed advancement of a 20 cm Mahurkar catheter. This was positioned with the tip at the cavoatrial junction. Spot chest radiograph shows good positioning and no pneumothorax. Catheter was flushed and sutured externally with 0-Prolene sutures. Patient tolerated the procedure well. FLUOROSCOPY TIME:  Less than 0.1 minute; 3 uGym2 DAP COMPLICATIONS: COMPLICATIONS none IMPRESSION: 1. Technically successful right IJ Mahurkar catheter placement. Electronically Signed   By: Lucrezia Europe M.D.   On: 12/20/2018 10:59   Ir US Guide Vasc Access Left  Result Date: 12/30/2018 INDICATION: 43 year old with acute renal failure. Recently removed right jugular dialysis catheter and needs new temporary access. EXAM: FLUOROSCOPIC AND ULTRASOUND GUIDED PLACEMENT OF A NON-TUNNELED DIALYSIS CATHETER Physician: Stephan Minister. Henn, MD MEDICATIONS: None ANESTHESIA/SEDATION: None FLUOROSCOPY TIME:  Fluoroscopy Time: 42 seconds, 2 mGy COMPLICATIONS: None immediate. PROCEDURE: Informed consent was obtained for catheter placement. The patient was placed supine on the interventional table. Ultrasound demonstrated focal thrombus in the right internal jugular vein at the old catheter site. Therefore, attention was directed to the left side of the neck. Ultrasound confirmed a patent left internal jugular veinvein. Ultrasound images were  obtained for documentation. The left side of the neck was prepped and draped in a sterile fashion. The left side of the neck was anesthetized with 1% lidocaine. Maximal barrier sterile technique was utilized including caps, mask, sterile gowns, sterile gloves, sterile drape, hand hygiene and skin antiseptic. A small incision was made with #11 blade scalpel. A 21 gauge needle directed into the left internal jugular vein with ultrasound guidance. A micropuncture dilator set was placed. A 20 cm Mahurkar catheter was selected. The catheter was advanced over a wire and positioned at the superior cavoatrial junction. Fluoroscopic images were obtained for documentation. Both dialysis lumens were found to aspirate and flush well. The proper amount of heparin was flushed in both  lumens. The central venous lumen was flushed with normal saline. Catheter was sutured to skin. FINDINGS: Catheter tip at the superior cavoatrial junction. Focal thrombus in the right internal jugular vein likely from recent catheter. IMPRESSION: Successful placement of a left jugular non-tunneled dialysis catheter using ultrasound and fluoroscopic guidance. Focal thrombus in the right internal jugular vein was discussed with Dr. Posey Pronto in Nephrology. Electronically Signed   By: Markus Daft M.D.   On: 12/30/2018 16:55   Ir US Guide Vasc Access Right  Result Date: 01/02/2019 CLINICAL DATA:  End-stage renal disease with current non tunneled temporary dialysis catheter via the left internal jugular vein. The patient requires placement of a tunneled catheter for long-term use. EXAM: TUNNELED CENTRAL VENOUS HEMODIALYSIS CATHETER PLACEMENT WITH ULTRASOUND AND FLUOROSCOPIC GUIDANCE ANESTHESIA/SEDATION: 1.0 mg IV Versed; 50 mcg IV Fentanyl. Total Moderate Sedation Time:   23 minutes. The patient's level of consciousness and physiologic status were continuously monitored during the procedure by Radiology nursing. MEDICATIONS: 2 g IV Ancef. FLUOROSCOPY TIME:   42 seconds. 6.9 mGy. PROCEDURE: The procedure, risks, benefits, and alternatives were explained to the patient. Questions regarding the procedure were encouraged and answered. The patient understands and consents to the procedure. A timeout was performed prior to initiating the procedure. The right neck and chest were prepped with chlorhexidine in a sterile fashion, and a sterile drape was applied covering the operative field. Maximum barrier sterile technique with sterile gowns and gloves were used for the procedure. Local anesthesia was provided with 1% lidocaine. Ultrasound was performed to confirm patency of the right internal jugular vein. Image documentation was performed. After creating a small venotomy incision, a 21 gauge needle was advanced into the right internal jugular vein under direct, real-time ultrasound guidance. Ultrasound image documentation was performed. After securing guidewire access, an 8 Fr dilator was placed. A J-wire was kinked to measure appropriate catheter length. A Palindrome tunneled hemodialysis catheter measuring 23 cm from tip to cuff was chosen for placement. This was tunneled in a retrograde fashion from the chest wall to the venotomy incision. At the venotomy, serial dilatation was performed and a 16 Fr peel-away sheath was placed over a guidewire. The catheter was then placed through the sheath and the sheath removed. Final catheter positioning was confirmed and documented with a fluoroscopic spot image. The catheter was aspirated, flushed with saline, and injected with appropriate volume heparin dwells. The venotomy incision was closed with subcutaneous 3-0 Monocryl and subcuticular 4-0 Vicryl. Dermabond was applied to the incision. The catheter exit site was secured with 0-Prolene retention sutures. After placement of the tunneled catheter, the left-sided non tunneled catheter was removed. COMPLICATIONS: None.  No pneumothorax. FINDINGS: The right internal jugular vein  demonstrates nonocclusive thrombus occupying approximately 50% of the lumen of the vein. The vein was patent enough to allow placement of a dialysis catheter. After catheter placement, the tip lies in the right atrium. The catheter aspirates normally and is ready for immediate use. IMPRESSION: Placement of tunneled hemodialysis catheter via the right internal jugular vein. The catheter tip lies in the right atrium. The catheter is ready for immediate use. Electronically Signed   By: Aletta Edouard M.D.   On: 01/02/2019 15:12   Ir US Guide Vasc Access Right  Result Date: 12/20/2018 CLINICAL DATA:  Rhabdomyolysis, renal failure, needs access for hemodialysis EXAM: EXAM RIGHT IJ CATHETER PLACEMENT UNDER ULTRASOUND AND FLUOROSCOPIC GUIDANCE TECHNIQUE: The procedure, risks (including but not limited to bleeding, infection, organ damage, pneumothorax), benefits, and alternatives  were explained to the patient. Questions regarding the procedure were encouraged and answered. The patient understands and consents to the procedure. Patency of the right IJ vein was confirmed with ultrasound with image documentation. An appropriate skin site was determined. Skin site was marked. Region was prepped using maximum barrier technique including cap and mask, sterile gown, sterile gloves, large sterile sheet, and Chlorhexidine as cutaneous antisepsis. The region was infiltrated locally with 1% lidocaine. Under real-time ultrasound guidance, the right IJ vein was accessed with a 21 gauge needle; the needle tip within the vein was confirmed with ultrasound image documentation. The needle exchanged over a 018 guidewire for vascular dilator which allowed advancement of a 20 cm Mahurkar catheter. This was positioned with the tip at the cavoatrial junction. Spot chest radiograph shows good positioning and no pneumothorax. Catheter was flushed and sutured externally with 0-Prolene sutures. Patient tolerated the procedure well. FLUOROSCOPY  TIME:  Less than 0.1 minute; 3 uGym2 DAP COMPLICATIONS: COMPLICATIONS none IMPRESSION: 1. Technically successful right IJ Mahurkar catheter placement. Electronically Signed   By: Lucrezia Europe M.D.   On: 12/20/2018 10:59   Dg Chest Port 1 View  Result Date: 12/25/2018 CLINICAL DATA:  Dyspnea EXAM: PORTABLE CHEST 1 VIEW COMPARISON:  12/17/2018 chest radiograph. FINDINGS: Right internal jugular central venous catheter terminates in the lower third of the SVC. Stable cardiomediastinal silhouette with normal heart size. No pneumothorax. Slight blunting of the right costophrenic angle. No left pleural effusion. No overt pulmonary edema. No acute consolidative airspace disease. IMPRESSION: 1. No overt pulmonary edema. 2. Slight blunting of the right costophrenic angle suggesting small right pleural effusion. Electronically Signed   By: Ilona Sorrel M.D.   On: 12/25/2018 08:38   Vas [place] Upper Ext Vein Mapping (pre-op Avf)  Result Date: 01/07/2019 UPPER EXTREMITY VEIN MAPPING Performing Technologist: Abram Sander RVS  Examination Guidelines: A complete evaluation includes B-mode imaging, spectral Doppler, color Doppler, and power Doppler as needed of all accessible portions of each vessel. Bilateral testing is considered an integral part of a complete examination. Limited examinations for reoccurring indications may be performed as noted. +-----------------+-------------+----------+--------------+  Right Cephalic    Diameter (cm) Depth (cm)    Findings     +-----------------+-------------+----------+--------------+  Shoulder              0.11         0.96                    +-----------------+-------------+----------+--------------+  Prox upper arm        0.25         0.97                    +-----------------+-------------+----------+--------------+  Mid upper arm         0.27         0.64                    +-----------------+-------------+----------+--------------+  Dist upper arm        0.32         0.66                     +-----------------+-------------+----------+--------------+  Antecubital fossa     0.41         0.36                    +-----------------+-------------+----------+--------------+  Prox forearm  0.29         0.54                    +-----------------+-------------+----------+--------------+  Mid forearm           0.25         0.52      branching     +-----------------+-------------+----------+--------------+  Dist forearm                               not visualized  +-----------------+-------------+----------+--------------+  Wrist                                      not visualized  +-----------------+-------------+----------+--------------+ +-----------------+-------------+----------+--------------+  Right Basilic     Diameter (cm) Depth (cm)    Findings     +-----------------+-------------+----------+--------------+  Prox upper arm        0.48         1.41                    +-----------------+-------------+----------+--------------+  Mid upper arm         0.56         1.48                    +-----------------+-------------+----------+--------------+  Dist upper arm        0.52         1.27                    +-----------------+-------------+----------+--------------+  Antecubital fossa     0.42         0.51      branching     +-----------------+-------------+----------+--------------+  Prox forearm                               not visualized  +-----------------+-------------+----------+--------------+  Mid forearm                                not visualized  +-----------------+-------------+----------+--------------+  Distal forearm                             not visualized  +-----------------+-------------+----------+--------------+  Elbow                                      not visualized  +-----------------+-------------+----------+--------------+  Wrist                                      not visualized  +-----------------+-------------+----------+--------------+  +-----------------+-------------+----------+---------+  Left Cephalic     Diameter (cm) Depth (cm) Findings   +-----------------+-------------+----------+---------+  Shoulder              0.25         0.78               +-----------------+-------------+----------+---------+  Prox upper arm        0.28         0.71               +-----------------+-------------+----------+---------+  Mid upper arm         0.28         0.48               +-----------------+-------------+----------+---------+  Dist upper arm        0.33         0.49               +-----------------+-------------+----------+---------+  Antecubital fossa     0.32         0.47               +-----------------+-------------+----------+---------+  Prox forearm          0.36         0.71               +-----------------+-------------+----------+---------+  Mid forearm           0.25         0.52    branching  +-----------------+-------------+----------+---------+  Dist forearm          0.22         0.30               +-----------------+-------------+----------+---------+  Wrist                 0.17         0.26               +-----------------+-------------+----------+---------+ +-----------------+-------------+----------+--------------+  Left Basilic      Diameter (cm) Depth (cm)    Findings     +-----------------+-------------+----------+--------------+  Prox upper arm        0.29         1.09                    +-----------------+-------------+----------+--------------+  Mid upper arm         0.32         0.99                    +-----------------+-------------+----------+--------------+  Dist upper arm        0.22         0.81                    +-----------------+-------------+----------+--------------+  Antecubital fossa     0.23         0.51      branching     +-----------------+-------------+----------+--------------+  Prox forearm                               not visualized  +-----------------+-------------+----------+--------------+  Mid forearm                                 not visualized  +-----------------+-------------+----------+--------------+  Distal forearm                             not visualized  +-----------------+-------------+----------+--------------+  Elbow                                      not visualized  +-----------------+-------------+----------+--------------+  Wrist  not visualized  +-----------------+-------------+----------+--------------+ *See table(s) above for measurements and observations.  Diagnosing physician: Monica Martinez MD Electronically signed by Monica Martinez MD on 01/07/2019 at 10:29:56 PM.    Final    Vas [place] Upper Extremity Venous Duplex  Result Date: 01/01/2019 UPPER VENOUS STUDY  Other Indications: Focal thrombus noted in the right IJ at prior catheter site by IR. Comparison Study: No prior study on file for comparison. Performing Technologist: Sharion Dove RVS  Examination Guidelines: A complete evaluation includes B-mode imaging, spectral Doppler, color Doppler, and power Doppler as needed of all accessible portions of each vessel. Bilateral testing is considered an integral part of a complete examination. Limited examinations for reoccurring indications may be performed as noted.  Right Findings: +----------+------------+---------+-----------+----------+----------+  RIGHT      Compressible Phasicity Spontaneous Properties  Summary    +----------+------------+---------+-----------+----------+----------+  IJV          Partial       Yes        Yes                            +----------+------------+---------+-----------+----------+----------+  Subclavian     Full        Yes        Yes                            +----------+------------+---------+-----------+----------+----------+  Axillary                   Yes        Yes                            +----------+------------+---------+-----------+----------+----------+  Brachial       Full        Yes        Yes                             +----------+------------+---------+-----------+----------+----------+  Radial                                                   visualized  +----------+------------+---------+-----------+----------+----------+  Ulnar                                                    visualized  +----------+------------+---------+-----------+----------+----------+  Cephalic       Full                                                  +----------+------------+---------+-----------+----------+----------+  Basilic        Full                                                  +----------+------------+---------+-----------+----------+----------+  Left Findings: +----------+------------+---------+-----------+----------+-------+  LEFT  Compressible Phasicity Spontaneous Properties Summary  +----------+------------+---------+-----------+----------+-------+  Subclavian                 Yes        Yes                         +----------+------------+---------+-----------+----------+-------+  Summary:  Right: Findings consistent with age indeterminate deep vein thrombosis involving the right internal jugular veins.  Left: No evidence of thrombosis in the subclavian.  *See table(s) above for measurements and observations.  Diagnosing physician: Servando Snare MD Electronically signed by Servando Snare MD on 01/01/2019 at 12:37:19 PM.    Final       Subjective: No major events overnight of this morning.  No complaints this morning.  Very happy and excited to go home finally.  Discharge Exam: Vitals:   01/14/19 2104 01/15/19 0512  BP: (!) 153/82 (!) 152/97  Pulse: (!) 58 68  Resp: 18 18  Temp: 98.2 F (36.8 C) 98.1 F (36.7 C)  SpO2: 97% 97%    GENERAL: No acute distress.  Appears well.  HEENT: MMM.  Vision and hearing grossly intact.  NECK: Supple.  No JVD.  LUNGS:  No IWOB. Good air movement bilaterally. HEART:  RRR. Heart sounds normal.  ABD: Bowel sounds present. Soft. Non tender.  MSK/EXT:  Moves all  extremities. No apparent deformity. No edema bilaterally. SKIN: Small skin bruises over bilateral knees.  No signs of infection.  Healing. NEURO: Awake, alert and oriented appropriately.  No gross deficit.  PSYCH: Calm. Normal affect.   AVF of her left forearm with good bruits.  No overlying skin erythema or signs of infection.  The results of significant diagnostics from this hospitalization (including imaging, microbiology, ancillary and laboratory) are listed below for reference.     Microbiology: No results found for this or any previous visit (from the past 240 hour(s)).   Labs: BNP (last 3 results) No results for input(s): BNP in the last 8760 hours. Basic Metabolic Panel: Recent Labs  Lab 01/10/19 0745 01/12/19 0828 01/13/19 0202 01/14/19 0226 01/15/19 0200  NA 139 140 139 138 137  K 4.0 4.1 3.4* 3.4* 3.7  CL 103 103 101 102 98  CO2 24 26 26 26 25   GLUCOSE 76 86 83 82 87  BUN 38* 36* 22* 29* 17  CREATININE 5.52* 3.81* 2.77* 2.94* 2.43*  CALCIUM 9.2 9.1 8.9 9.2 9.6  PHOS 6.6* 5.8* 4.5 5.2* 4.6   Liver Function Tests: Recent Labs  Lab 01/09/19 0500 01/10/19 0745 01/12/19 0828 01/13/19 0202 01/14/19 0226 01/15/19 0200  AST 19 15  --   --   --   --   ALT <5 <5  --   --   --   --   ALKPHOS 51 55  --   --   --   --   BILITOT 0.6 0.6  --   --   --   --   PROT 5.7* 6.2*  --   --   --   --   ALBUMIN 2.7* 3.0* 3.1* 3.1* 3.2* 3.5   No results for input(s): LIPASE, AMYLASE in the last 168 hours. No results for input(s): AMMONIA in the last 168 hours. CBC: Recent Labs  Lab 01/09/19 0500 01/10/19 0745 01/11/19 0226 01/12/19 0304 01/14/19 0226 01/15/19 0200  WBC 5.7 7.5 5.2 5.4 5.4  --   HGB 8.7* 8.8* 8.8* 8.4* 9.1* 10.0*  HCT 26.7* 27.6* 27.1* 25.9*  27.8* 30.3*  MCV 95.0 96.5 95.1 95.2 95.5  --   PLT 292 291 257 253 217  --    Cardiac Enzymes: No results for input(s): CKTOTAL, CKMB, CKMBINDEX, TROPONINI in the last 168 hours. BNP: Invalid input(s):  POCBNP CBG: Recent Labs  Lab 01/09/19 1202  GLUCAP 71   D-Dimer No results for input(s): DDIMER in the last 72 hours. Hgb A1c No results for input(s): HGBA1C in the last 72 hours. Lipid Profile No results for input(s): CHOL, HDL, LDLCALC, TRIG, CHOLHDL, LDLDIRECT in the last 72 hours. Thyroid function studies No results for input(s): TSH, T4TOTAL, T3FREE, THYROIDAB in the last 72 hours.  Invalid input(s): FREET3 Anemia work up No results for input(s): VITAMINB12, FOLATE, FERRITIN, TIBC, IRON, RETICCTPCT in the last 72 hours. Urinalysis    Component Value Date/Time   COLORURINE AMBER (A) 12/25/2018 1644   APPEARANCEUR CLOUDY (A) 12/25/2018 1644   LABSPEC 1.013 12/25/2018 1644   PHURINE 7.0 12/25/2018 1644   GLUCOSEU NEGATIVE 12/25/2018 1644   HGBUR LARGE (A) 12/25/2018 1644   BILIRUBINUR NEGATIVE 12/25/2018 1644   KETONESUR NEGATIVE 12/25/2018 1644   PROTEINUR 100 (A) 12/25/2018 1644   UROBILINOGEN 1.0 05/11/2014 0921   NITRITE NEGATIVE 12/25/2018 1644   LEUKOCYTESUR MODERATE (A) 12/25/2018 1644   Sepsis Labs Invalid input(s): PROCALCITONIN,  WBC,  LACTICIDVEN   Time coordinating discharge: 35 minutes  SIGNED:  Mercy Riding, MD  [facility] 01/15/2019, 11:15 AM Pager [phone]  If 7PM-7AM, please contact night-coverage [email or web address] Password [email or web address]

## 2019-01-15 NOTE — Progress Notes (Addendum)
ANTICOAGULATION CONSULT NOTE - Follow Up Consult  Pharmacy Consult for Heparin, Warfarin Indication: DVT (IJ thrombus)  No Known Allergies  Patient Measurements: Height: 6\' 3"  (190.5 cm) Weight: 216 lb 11.4 oz (98.3 kg) IBW/kg (Calculated) : 84.5 Heparin Dosing Weight: 110 kg  Vital Signs: Temp: 98.1 F (36.7 C) (05/03 0512) Temp Source: Oral (05/03 0512) BP: 152/97 (05/03 0512) Pulse Rate: 68 (05/03 0512)  Labs: Recent Labs    01/13/19 0202 01/14/19 0226 01/15/19 0200  HGB  --  9.1* 10.0*  HCT  --  27.8* 30.3*  PLT  --  217  --   LABPROT 16.2* 17.8* 19.1*  INR 1.3* 1.5* 1.6*  HEPARINUNFRC 0.43 0.49 0.57  CREATININE 2.77* 2.94* 2.43*    Estimated Creatinine Clearance: 45.9 mL/min (A) (by C-G formula based on SCr of 2.43 mg/dL (H)).  Assessment: [name] on heparin for IJ thrombus. S/p AVF placement with heparin bridge to coumadin.  Heparin level therapeutic at 0.57 this AM on 2400 units/hr. INR increasing but remains subtherapeutic at 1.6. Hgb improved to 10, plts ok, no reports of bleeding.   Goal of Therapy:  INR 2-3 Heparin level 0.3-0.7 units/ml Monitor platelets by anticoagulation protocol: Yes   Plan:  Continue heparin at 2400 units / hr Warfarin 7.5 mg po x1 tonight  Daily INR, heparin level, CBC, s/sx bleeding   Addendum: Pharmacy now consulted to switch to Eliquis. 72 hour HD gap to monitor for GFR recovery. SCR down to 2.43. Plan: Eliquis 10mg  PO BID x7 days Followed by 5mg  PO BID F/u renal function, s/sx bleeding, CBC   Juanell Fairly, PharmD PGY1 Pharmacy Resident 01/15/2019 7:45 AM Please utilize Amion for appropriate phone number to reach the unit pharmacist ([place])

## 2019-01-15 NOTE — TOC Transition Note (Signed)
Transition of Care [facility]) - CM/SW Discharge Note   Patient Details  Name: James [name] MRN: [id number] Date of Birth: 19-Dec-1975  [facility] CM/SW Contact:  Carles Collet, RN Phone Number: 01/15/2019, 11:29 AM   Clinical Narrative:   Damaris Schooner w patient. Updated him that his Eliquis would cost $45 a month per the pharmacist at [facility]. He stated "that is fine." Patient provided with 30 day copay waiver. He declined DME and HH. No other CM needs.    Final next level of care: Home/Self Care Barriers to Discharge: No Barriers Identified   Patient Goals and CMS Choice Patient states their goals for this hospitalization and ongoing recovery are:: to return home CMS Medicare.gov Compare Post Acute Care list provided to:: Patient Choice offered to / list presented to : Patient  Discharge Placement                       Discharge Plan and Services In-house Referral: Clinical Social Work Discharge Planning Services: CM Consult Post Acute Care Choice: Home Health, Durable Medical Equipment          DME Arranged: [place] rolling         HH Arranged: Refused [place] Agency: [place] ([facility])        Social Determinants of Health (SDOH) Interventions     Readmission Risk Interventions Readmission Risk Prevention Plan 01/12/2019 01/04/2019  Transportation Screening Complete -  PCP or Specialist Appt within 3-5 Days Complete Complete  HRI or Home Care Consult Complete -  Social Work Consult for [place] Planning/Counseling Not Complete -  SW consult not completed comments NA -  Palliative Care Screening Not Applicable -  Medication Review [employer] photographer) Complete -  Some recent data might be hidden

## 2019-01-15 NOTE — Progress Notes (Signed)
Patient ID: James [name], male   DOB: [birth year], 43 y.o.   MRN: [id number] [facility] Progress Note   Assessment/ Plan:   #  Prolonged dialysis dependence Acute kidney Injury:  - 2/2 ATN. Hx rhabdo.  First HD was 4/7.  Tunneled catheter placed 4/20 (IR); s/p L RC AVF VVS Early 4/27 - Continues to be dialysis dependent BUT UOP is excellent - Will likley  Be able to DC HD in next few weeks - NO reason can't DC, has a spot at [facility] 2nd shift TTS at 11:45 with arrival 11:00 am first treatment  - Will need weekly labs qTuesday to follow for GFR recovery  # HTN: follow with UF/probing EDW  #  Volume overload: optimize with HD  #  Bipolar disorder/anxiety: management per primary service.  #  History of alcohol abuse: initial ultrasound suggestive of early development of cirrhosis (suspect fatty liver has a contributory effect).   # Hyponatremia: resolved  # MSSA bacteremia:  oncefazolin.  Status post catheter holiday as above. Last blood cultures NGTD   #  Right IJ thrombus: heparin gtt peri-operatively,restarted warfarin and waiting for INR >2  # Anemia - secondary in part to AKI.  aranesp - first dose 4/23.  No acute indication for PRBC's;    Subjective:    No interval events  INR 1.6  1.8L UF  HD yesterday, 3L UF  Fees well  To chagne from wafarin to apixaban    Objective:   BP (!) 152/97 (BP Location: Right Arm) Comment: notified the nurse  Pulse 68   Temp 98.1 F (36.7 C) (Oral)   Resp 18   Ht 6\' 3"  (1.905 m)   Wt 98.3 kg   SpO2 97%   BMI 27.09 kg/m   Intake/Output Summary (Last 24 hours) at 01/15/2019 1142 Last data filed at 01/15/2019 0900 Gross per 24 hour  Intake 720 ml  Output 2150 ml  Net -1430 ml   Weight change: -3.3 kg  Physical Exam: Gen: Adult male lying in bed in no distress CVS: RRR; no rub Resp: Clear to auscultation and normal work of breathing Abd: Soft, obese, nontender Ext: no lower extremity  edema bilaterally Neuro: alert and oriented x 3 conversant and provides history GU no foley Access: right IJ tunneled catheter; L RC AVF +B   Imaging: No results found.  Labs: BMET Recent Labs  Lab 01/09/19 0500 01/10/19 0745 01/12/19 0828 01/13/19 0202 01/14/19 0226 01/15/19 0200  NA 139 139 140 139 138 137  K 4.2 4.0 4.1 3.4* 3.4* 3.7  CL 102 103 103 101 102 98  CO2 27 24 26 26 26 25   GLUCOSE 82 76 86 83 82 87  BUN 30* 38* 36* 22* 29* 17  CREATININE 5.39* 5.52* 3.81* 2.77* 2.94* 2.43*  CALCIUM 8.9 9.2 9.1 8.9 9.2 9.6  PHOS  --  6.6* 5.8* 4.5 5.2* 4.6   CBC Recent Labs  Lab 01/10/19 0745 01/11/19 0226 01/12/19 0304 01/14/19 0226 01/15/19 0200  WBC 7.5 5.2 5.4 5.4  --   HGB 8.8* 8.8* 8.4* 9.1* 10.0*  HCT 27.6* 27.1* 25.9* 27.8* 30.3*  MCV 96.5 95.1 95.2 95.5  --   PLT 291 257 253 217  --     Medications:    . amLODipine  10 mg Oral Daily  . apixaban  10 mg Oral BID   Followed by  . [START ON 01/22/2019] apixaban  5 mg Oral BID  . Chlorhexidine Gluconate  Cloth  6 each Topical [id number]  . darbepoetin (ARANESP) injection - DIALYSIS  100 mcg Intravenous Q Thu-HD  . feeding supplement (ENSURE ENLIVE)  237 mL Oral BID BM  . FLUoxetine  40 mg Oral Daily  . folic acid  1 mg Oral Daily  . loxapine  25 mg Oral Daily  . loxapine  50 mg Oral QHS  . polyethylene glycol  17 g Oral Daily  . risperiDONE  9 mg Oral QHS  . senna-docusate  1 tablet Oral BID  . thiamine  100 mg Oral Daily  . topiramate  250 mg Oral QHS  . traZODone  300 mg Oral QHS   Rexene Agent 01/15/2019

## 2019-01-16 ENCOUNTER — Telehealth: Payer: Self-pay | Admitting: Vascular Surgery

## 2019-01-16 NOTE — Telephone Encounter (Signed)
pt has HD Tues/ Thur 11am had to switch from MD to NP mld ltr  02/20/2019 1pm Dialysis Duplex 215pm p/o NP

## 2019-01-16 NOTE — Telephone Encounter (Signed)
-----   Message from Gabriel Earing, [place] sent at 01/09/2019 11:47 AM EDT ----- S/p left RC AVF 4.27.2020.  f/u with Dr. Donnetta Hutching in 6 weeks with duplex.  Thanks

## 2019-01-17 DIAGNOSIS — F10929 Alcohol use, unspecified with intoxication, unspecified: Secondary | ICD-10-CM | POA: Diagnosis not present

## 2019-01-17 DIAGNOSIS — I471 Supraventricular tachycardia: Secondary | ICD-10-CM | POA: Diagnosis not present

## 2019-01-17 DIAGNOSIS — I491 Atrial premature depolarization: Secondary | ICD-10-CM | POA: Diagnosis not present

## 2019-01-17 DIAGNOSIS — R000 Tachycardia, unspecified: Secondary | ICD-10-CM | POA: Diagnosis not present

## 2019-01-17 DIAGNOSIS — E876 Hypokalemia: Secondary | ICD-10-CM | POA: Diagnosis not present

## 2019-01-17 DIAGNOSIS — E119 Type 2 diabetes mellitus without complications: Secondary | ICD-10-CM | POA: Diagnosis not present

## 2019-01-17 DIAGNOSIS — R4020 Unspecified coma: Secondary | ICD-10-CM | POA: Diagnosis not present

## 2019-01-17 DIAGNOSIS — R079 Chest pain, unspecified: Secondary | ICD-10-CM | POA: Diagnosis not present

## 2019-01-17 DIAGNOSIS — N178 Other acute kidney failure: Secondary | ICD-10-CM | POA: Diagnosis not present

## 2019-01-19 ENCOUNTER — Inpatient Hospital Stay: Payer: Medicare HMO | Admitting: Primary Care

## 2019-01-19 DIAGNOSIS — N178 Other acute kidney failure: Secondary | ICD-10-CM | POA: Diagnosis not present

## 2019-01-19 DIAGNOSIS — N179 Acute kidney failure, unspecified: Secondary | ICD-10-CM | POA: Diagnosis not present

## 2019-01-21 DIAGNOSIS — N178 Other acute kidney failure: Secondary | ICD-10-CM | POA: Diagnosis not present

## 2019-01-23 ENCOUNTER — Encounter: Payer: Self-pay | Admitting: Primary Care

## 2019-01-23 ENCOUNTER — Other Ambulatory Visit: Payer: Self-pay

## 2019-01-23 ENCOUNTER — Ambulatory Visit: Payer: Medicare HMO | Attending: Primary Care | Admitting: Primary Care

## 2019-01-23 DIAGNOSIS — E0800 Diabetes mellitus due to underlying condition with hyperosmolarity without nonketotic hyperglycemic-hyperosmolar coma (NKHHC): Secondary | ICD-10-CM | POA: Diagnosis not present

## 2019-01-23 DIAGNOSIS — I10 Essential (primary) hypertension: Secondary | ICD-10-CM | POA: Diagnosis not present

## 2019-01-23 DIAGNOSIS — N186 End stage renal disease: Secondary | ICD-10-CM | POA: Diagnosis not present

## 2019-01-23 DIAGNOSIS — Z992 Dependence on renal dialysis: Secondary | ICD-10-CM

## 2019-01-23 DIAGNOSIS — Z09 Encounter for follow-up examination after completed treatment for conditions other than malignant neoplasm: Secondary | ICD-10-CM

## 2019-01-23 NOTE — Progress Notes (Signed)
Virtual Visit via Telephone Note  I connected with James [name] on 01/23/19 at  9:50 AM EDT by telephone and verified that I am speaking with the correct person using two identifiers.   I discussed the limitations, risks, security and privacy concerns of performing an evaluation and management service by telephone and the availability of in person appointments. I also discussed with the patient that there may be a patient responsible charge related to this service. The patient expressed understanding and agreed to proceed.   History of Present Illness: James [name] is being seen for hospital f/u with history of hypertension, well controlled NIDDM-2 and bipolar disorder presenting to emergency department via EMS after found down on floor for 2 days. He reported he had been binge drinking alcohol. He has CKD and has dialysis Tuesday, Thursday and Saturday. Patient explains he has a PCP in [place], [place]. Dr. Marco Collie and has no interest in being seen in [place].    Observations/Objective: .Review of Systems  Constitutional: Negative.   HENT: Negative.   Eyes: Negative.   Respiratory: Negative.   Cardiovascular: Negative.   Musculoskeletal: Positive for falls.       Out of the bed when he was inebriated   Skin: Negative.    Assessment and Plan: James [name] was seen today for hospitalization follow-up.  Diagnoses and all orders for this visit:  Hospital discharge follow-up Records did not indicate patient was followed by a PCP . He is doing well with no c/o and will cont seeing Dr. Tula Nakayama was seen today for hospitalization follow-up.  CKD (chronic kidney disease) stage V requiring chronic dialysis [facility])  Patient has dialysis Tues/Thur/Sat managed by nephrology   Diabetes mellitus due to underlying condition with hyperosmolarity without coma, without long-term current use of insulin [facility]) Patient stated well controlled since his weight loss not taking any medication this  time    Follow Up Instructions:    I discussed the assessment and treatment plan with the patient. The patient was provided an opportunity to ask questions and all were answered. The patient agreed with the plan and demonstrated an understanding of the instructions.   The patient was advised to call back or seek an in-person evaluation if the symptoms worsen or if the condition fails to improve as anticipated.  I provided 20 minutes of non-face-to-face time during this encounter.   Kerin Perna, NP

## 2019-01-23 NOTE — Progress Notes (Signed)
Hospital f/u   Has dialysis on T, Th, Sat.

## 2019-01-27 DIAGNOSIS — Z1339 Encounter for screening examination for other mental health and behavioral disorders: Secondary | ICD-10-CM | POA: Diagnosis not present

## 2019-01-27 DIAGNOSIS — F319 Bipolar disorder, unspecified: Secondary | ICD-10-CM | POA: Diagnosis not present

## 2019-01-27 DIAGNOSIS — N179 Acute kidney failure, unspecified: Secondary | ICD-10-CM | POA: Diagnosis not present

## 2019-01-27 DIAGNOSIS — Z139 Encounter for screening, unspecified: Secondary | ICD-10-CM | POA: Diagnosis not present

## 2019-01-27 DIAGNOSIS — Z7689 Persons encountering health services in other specified circumstances: Secondary | ICD-10-CM | POA: Diagnosis not present

## 2019-01-27 DIAGNOSIS — Z992 Dependence on renal dialysis: Secondary | ICD-10-CM | POA: Diagnosis not present

## 2019-01-27 DIAGNOSIS — Z0000 Encounter for general adult medical examination without abnormal findings: Secondary | ICD-10-CM | POA: Diagnosis not present

## 2019-02-02 DIAGNOSIS — N186 End stage renal disease: Secondary | ICD-10-CM | POA: Diagnosis not present

## 2019-02-02 DIAGNOSIS — N179 Acute kidney failure, unspecified: Secondary | ICD-10-CM | POA: Diagnosis not present

## 2019-02-02 DIAGNOSIS — Z09 Encounter for follow-up examination after completed treatment for conditions other than malignant neoplasm: Secondary | ICD-10-CM | POA: Diagnosis not present

## 2019-02-10 DIAGNOSIS — E1140 Type 2 diabetes mellitus with diabetic neuropathy, unspecified: Secondary | ICD-10-CM | POA: Diagnosis not present

## 2019-02-10 DIAGNOSIS — N179 Acute kidney failure, unspecified: Secondary | ICD-10-CM | POA: Diagnosis not present

## 2019-02-10 DIAGNOSIS — F319 Bipolar disorder, unspecified: Secondary | ICD-10-CM | POA: Diagnosis not present

## 2019-02-10 DIAGNOSIS — Z992 Dependence on renal dialysis: Secondary | ICD-10-CM | POA: Diagnosis not present

## 2019-02-13 ENCOUNTER — Other Ambulatory Visit: Payer: Self-pay

## 2019-02-13 DIAGNOSIS — N185 Chronic kidney disease, stage 5: Secondary | ICD-10-CM

## 2019-02-20 ENCOUNTER — Encounter: Payer: Medicare HMO | Admitting: Family

## 2019-02-20 ENCOUNTER — Ambulatory Visit (HOSPITAL_COMMUNITY): Payer: Medicare HMO

## 2019-02-21 ENCOUNTER — Encounter (HOSPITAL_COMMUNITY): Payer: Medicare HMO

## 2019-02-21 ENCOUNTER — Encounter: Payer: Medicare HMO | Admitting: Vascular Surgery

## 2019-02-22 DIAGNOSIS — F315 Bipolar disorder, current episode depressed, severe, with psychotic features: Secondary | ICD-10-CM | POA: Diagnosis not present

## 2019-03-01 DIAGNOSIS — F419 Anxiety disorder, unspecified: Secondary | ICD-10-CM | POA: Diagnosis not present

## 2019-03-14 DIAGNOSIS — E1169 Type 2 diabetes mellitus with other specified complication: Secondary | ICD-10-CM | POA: Diagnosis not present

## 2019-03-14 DIAGNOSIS — N179 Acute kidney failure, unspecified: Secondary | ICD-10-CM | POA: Diagnosis not present

## 2019-03-14 DIAGNOSIS — Z992 Dependence on renal dialysis: Secondary | ICD-10-CM | POA: Diagnosis not present

## 2019-03-14 DIAGNOSIS — E1140 Type 2 diabetes mellitus with diabetic neuropathy, unspecified: Secondary | ICD-10-CM | POA: Diagnosis not present

## 2019-03-22 DIAGNOSIS — F315 Bipolar disorder, current episode depressed, severe, with psychotic features: Secondary | ICD-10-CM | POA: Diagnosis not present

## 2019-04-14 DIAGNOSIS — E1140 Type 2 diabetes mellitus with diabetic neuropathy, unspecified: Secondary | ICD-10-CM | POA: Diagnosis not present

## 2019-04-14 DIAGNOSIS — E1169 Type 2 diabetes mellitus with other specified complication: Secondary | ICD-10-CM | POA: Diagnosis not present

## 2019-04-14 DIAGNOSIS — Z992 Dependence on renal dialysis: Secondary | ICD-10-CM | POA: Diagnosis not present

## 2019-04-14 DIAGNOSIS — N179 Acute kidney failure, unspecified: Secondary | ICD-10-CM | POA: Diagnosis not present

## 2019-05-01 DIAGNOSIS — F315 Bipolar disorder, current episode depressed, severe, with psychotic features: Secondary | ICD-10-CM | POA: Diagnosis not present

## 2019-05-15 DIAGNOSIS — Z992 Dependence on renal dialysis: Secondary | ICD-10-CM | POA: Diagnosis not present

## 2019-05-15 DIAGNOSIS — E1169 Type 2 diabetes mellitus with other specified complication: Secondary | ICD-10-CM | POA: Diagnosis not present

## 2019-05-15 DIAGNOSIS — E1140 Type 2 diabetes mellitus with diabetic neuropathy, unspecified: Secondary | ICD-10-CM | POA: Diagnosis not present

## 2019-05-15 DIAGNOSIS — F315 Bipolar disorder, current episode depressed, severe, with psychotic features: Secondary | ICD-10-CM | POA: Diagnosis not present

## 2019-05-15 DIAGNOSIS — N179 Acute kidney failure, unspecified: Secondary | ICD-10-CM | POA: Diagnosis not present

## 2019-06-14 DIAGNOSIS — E1140 Type 2 diabetes mellitus with diabetic neuropathy, unspecified: Secondary | ICD-10-CM | POA: Diagnosis not present

## 2019-06-14 DIAGNOSIS — Z992 Dependence on renal dialysis: Secondary | ICD-10-CM | POA: Diagnosis not present

## 2019-06-14 DIAGNOSIS — E1169 Type 2 diabetes mellitus with other specified complication: Secondary | ICD-10-CM | POA: Diagnosis not present

## 2019-06-14 DIAGNOSIS — E785 Hyperlipidemia, unspecified: Secondary | ICD-10-CM | POA: Diagnosis not present

## 2019-06-23 DIAGNOSIS — Z23 Encounter for immunization: Secondary | ICD-10-CM | POA: Diagnosis not present

## 2019-06-23 DIAGNOSIS — E1159 Type 2 diabetes mellitus with other circulatory complications: Secondary | ICD-10-CM | POA: Diagnosis not present

## 2019-06-23 DIAGNOSIS — E1169 Type 2 diabetes mellitus with other specified complication: Secondary | ICD-10-CM | POA: Diagnosis not present

## 2019-06-26 DIAGNOSIS — F315 Bipolar disorder, current episode depressed, severe, with psychotic features: Secondary | ICD-10-CM | POA: Diagnosis not present

## 2019-06-30 DIAGNOSIS — E1140 Type 2 diabetes mellitus with diabetic neuropathy, unspecified: Secondary | ICD-10-CM | POA: Diagnosis not present

## 2019-06-30 DIAGNOSIS — E1159 Type 2 diabetes mellitus with other circulatory complications: Secondary | ICD-10-CM | POA: Diagnosis not present

## 2019-06-30 DIAGNOSIS — E1169 Type 2 diabetes mellitus with other specified complication: Secondary | ICD-10-CM | POA: Diagnosis not present

## 2019-06-30 DIAGNOSIS — E785 Hyperlipidemia, unspecified: Secondary | ICD-10-CM | POA: Diagnosis not present

## 2019-07-14 DIAGNOSIS — E1169 Type 2 diabetes mellitus with other specified complication: Secondary | ICD-10-CM | POA: Diagnosis not present

## 2019-07-14 DIAGNOSIS — E1140 Type 2 diabetes mellitus with diabetic neuropathy, unspecified: Secondary | ICD-10-CM | POA: Diagnosis not present

## 2019-07-14 DIAGNOSIS — E1159 Type 2 diabetes mellitus with other circulatory complications: Secondary | ICD-10-CM | POA: Diagnosis not present

## 2019-07-14 DIAGNOSIS — E785 Hyperlipidemia, unspecified: Secondary | ICD-10-CM | POA: Diagnosis not present

## 2019-08-14 DIAGNOSIS — E785 Hyperlipidemia, unspecified: Secondary | ICD-10-CM | POA: Diagnosis not present

## 2019-08-14 DIAGNOSIS — E1159 Type 2 diabetes mellitus with other circulatory complications: Secondary | ICD-10-CM | POA: Diagnosis not present

## 2019-08-14 DIAGNOSIS — E1169 Type 2 diabetes mellitus with other specified complication: Secondary | ICD-10-CM | POA: Diagnosis not present

## 2019-08-14 DIAGNOSIS — E1140 Type 2 diabetes mellitus with diabetic neuropathy, unspecified: Secondary | ICD-10-CM | POA: Diagnosis not present

## 2019-08-23 DIAGNOSIS — F315 Bipolar disorder, current episode depressed, severe, with psychotic features: Secondary | ICD-10-CM | POA: Diagnosis not present

## 2019-09-13 DIAGNOSIS — E1140 Type 2 diabetes mellitus with diabetic neuropathy, unspecified: Secondary | ICD-10-CM | POA: Diagnosis not present

## 2019-09-13 DIAGNOSIS — E1169 Type 2 diabetes mellitus with other specified complication: Secondary | ICD-10-CM | POA: Diagnosis not present

## 2019-09-13 DIAGNOSIS — E1159 Type 2 diabetes mellitus with other circulatory complications: Secondary | ICD-10-CM | POA: Diagnosis not present

## 2019-09-13 DIAGNOSIS — E785 Hyperlipidemia, unspecified: Secondary | ICD-10-CM | POA: Diagnosis not present

## 2019-09-26 DIAGNOSIS — F315 Bipolar disorder, current episode depressed, severe, with psychotic features: Secondary | ICD-10-CM | POA: Diagnosis not present

## 2019-10-13 DIAGNOSIS — E1140 Type 2 diabetes mellitus with diabetic neuropathy, unspecified: Secondary | ICD-10-CM | POA: Diagnosis not present

## 2019-10-13 DIAGNOSIS — E785 Hyperlipidemia, unspecified: Secondary | ICD-10-CM | POA: Diagnosis not present

## 2019-10-13 DIAGNOSIS — E1169 Type 2 diabetes mellitus with other specified complication: Secondary | ICD-10-CM | POA: Diagnosis not present

## 2019-10-13 DIAGNOSIS — E1159 Type 2 diabetes mellitus with other circulatory complications: Secondary | ICD-10-CM | POA: Diagnosis not present

## 2019-11-10 DIAGNOSIS — E1169 Type 2 diabetes mellitus with other specified complication: Secondary | ICD-10-CM | POA: Diagnosis not present

## 2019-11-12 DIAGNOSIS — E1169 Type 2 diabetes mellitus with other specified complication: Secondary | ICD-10-CM | POA: Diagnosis not present

## 2019-11-12 DIAGNOSIS — E785 Hyperlipidemia, unspecified: Secondary | ICD-10-CM | POA: Diagnosis not present

## 2019-11-12 DIAGNOSIS — E1159 Type 2 diabetes mellitus with other circulatory complications: Secondary | ICD-10-CM | POA: Diagnosis not present

## 2019-11-12 DIAGNOSIS — E1140 Type 2 diabetes mellitus with diabetic neuropathy, unspecified: Secondary | ICD-10-CM | POA: Diagnosis not present

## 2019-11-17 DIAGNOSIS — E1169 Type 2 diabetes mellitus with other specified complication: Secondary | ICD-10-CM | POA: Diagnosis not present

## 2019-11-17 DIAGNOSIS — Z139 Encounter for screening, unspecified: Secondary | ICD-10-CM | POA: Diagnosis not present

## 2019-11-17 DIAGNOSIS — Z0000 Encounter for general adult medical examination without abnormal findings: Secondary | ICD-10-CM | POA: Diagnosis not present

## 2019-11-17 DIAGNOSIS — E1140 Type 2 diabetes mellitus with diabetic neuropathy, unspecified: Secondary | ICD-10-CM | POA: Diagnosis not present

## 2019-11-17 DIAGNOSIS — Z136 Encounter for screening for cardiovascular disorders: Secondary | ICD-10-CM | POA: Diagnosis not present

## 2019-11-17 DIAGNOSIS — Z1331 Encounter for screening for depression: Secondary | ICD-10-CM | POA: Diagnosis not present

## 2019-11-17 DIAGNOSIS — Z1339 Encounter for screening examination for other mental health and behavioral disorders: Secondary | ICD-10-CM | POA: Diagnosis not present

## 2019-11-17 DIAGNOSIS — E1159 Type 2 diabetes mellitus with other circulatory complications: Secondary | ICD-10-CM | POA: Diagnosis not present

## 2019-11-17 DIAGNOSIS — E785 Hyperlipidemia, unspecified: Secondary | ICD-10-CM | POA: Diagnosis not present

## 2019-11-21 DIAGNOSIS — F315 Bipolar disorder, current episode depressed, severe, with psychotic features: Secondary | ICD-10-CM | POA: Diagnosis not present

## 2019-12-13 DIAGNOSIS — E1169 Type 2 diabetes mellitus with other specified complication: Secondary | ICD-10-CM | POA: Diagnosis not present

## 2019-12-13 DIAGNOSIS — E785 Hyperlipidemia, unspecified: Secondary | ICD-10-CM | POA: Diagnosis not present

## 2019-12-13 DIAGNOSIS — E1159 Type 2 diabetes mellitus with other circulatory complications: Secondary | ICD-10-CM | POA: Diagnosis not present

## 2019-12-13 DIAGNOSIS — E1140 Type 2 diabetes mellitus with diabetic neuropathy, unspecified: Secondary | ICD-10-CM | POA: Diagnosis not present

## 2020-01-09 DIAGNOSIS — F315 Bipolar disorder, current episode depressed, severe, with psychotic features: Secondary | ICD-10-CM | POA: Diagnosis not present

## 2020-01-12 DIAGNOSIS — E785 Hyperlipidemia, unspecified: Secondary | ICD-10-CM | POA: Diagnosis not present

## 2020-01-12 DIAGNOSIS — E1159 Type 2 diabetes mellitus with other circulatory complications: Secondary | ICD-10-CM | POA: Diagnosis not present

## 2020-01-12 DIAGNOSIS — E1140 Type 2 diabetes mellitus with diabetic neuropathy, unspecified: Secondary | ICD-10-CM | POA: Diagnosis not present

## 2020-01-12 DIAGNOSIS — E1169 Type 2 diabetes mellitus with other specified complication: Secondary | ICD-10-CM | POA: Diagnosis not present

## 2020-02-12 DIAGNOSIS — E785 Hyperlipidemia, unspecified: Secondary | ICD-10-CM | POA: Diagnosis not present

## 2020-02-12 DIAGNOSIS — E1159 Type 2 diabetes mellitus with other circulatory complications: Secondary | ICD-10-CM | POA: Diagnosis not present

## 2020-02-12 DIAGNOSIS — E1140 Type 2 diabetes mellitus with diabetic neuropathy, unspecified: Secondary | ICD-10-CM | POA: Diagnosis not present

## 2020-02-12 DIAGNOSIS — E1169 Type 2 diabetes mellitus with other specified complication: Secondary | ICD-10-CM | POA: Diagnosis not present

## 2020-03-05 DIAGNOSIS — F315 Bipolar disorder, current episode depressed, severe, with psychotic features: Secondary | ICD-10-CM | POA: Diagnosis not present

## 2020-03-08 DIAGNOSIS — E1169 Type 2 diabetes mellitus with other specified complication: Secondary | ICD-10-CM | POA: Diagnosis not present

## 2020-03-13 DIAGNOSIS — E785 Hyperlipidemia, unspecified: Secondary | ICD-10-CM | POA: Diagnosis not present

## 2020-03-13 DIAGNOSIS — E1169 Type 2 diabetes mellitus with other specified complication: Secondary | ICD-10-CM | POA: Diagnosis not present

## 2020-03-13 DIAGNOSIS — E1140 Type 2 diabetes mellitus with diabetic neuropathy, unspecified: Secondary | ICD-10-CM | POA: Diagnosis not present

## 2020-03-13 DIAGNOSIS — I152 Hypertension secondary to endocrine disorders: Secondary | ICD-10-CM | POA: Diagnosis not present

## 2020-03-20 DIAGNOSIS — E785 Hyperlipidemia, unspecified: Secondary | ICD-10-CM | POA: Diagnosis not present

## 2020-03-20 DIAGNOSIS — E1169 Type 2 diabetes mellitus with other specified complication: Secondary | ICD-10-CM | POA: Diagnosis not present

## 2020-03-20 DIAGNOSIS — I152 Hypertension secondary to endocrine disorders: Secondary | ICD-10-CM | POA: Diagnosis not present

## 2020-03-20 DIAGNOSIS — E1159 Type 2 diabetes mellitus with other circulatory complications: Secondary | ICD-10-CM | POA: Diagnosis not present

## 2020-04-02 DIAGNOSIS — F315 Bipolar disorder, current episode depressed, severe, with psychotic features: Secondary | ICD-10-CM | POA: Diagnosis not present

## 2020-04-14 DIAGNOSIS — E785 Hyperlipidemia, unspecified: Secondary | ICD-10-CM | POA: Diagnosis not present

## 2020-04-14 DIAGNOSIS — I152 Hypertension secondary to endocrine disorders: Secondary | ICD-10-CM | POA: Diagnosis not present

## 2020-04-14 DIAGNOSIS — E1169 Type 2 diabetes mellitus with other specified complication: Secondary | ICD-10-CM | POA: Diagnosis not present

## 2020-05-14 DIAGNOSIS — E1169 Type 2 diabetes mellitus with other specified complication: Secondary | ICD-10-CM | POA: Diagnosis not present

## 2020-05-14 DIAGNOSIS — I152 Hypertension secondary to endocrine disorders: Secondary | ICD-10-CM | POA: Diagnosis not present

## 2020-05-14 DIAGNOSIS — E785 Hyperlipidemia, unspecified: Secondary | ICD-10-CM | POA: Diagnosis not present

## 2020-05-15 DIAGNOSIS — I152 Hypertension secondary to endocrine disorders: Secondary | ICD-10-CM | POA: Diagnosis not present

## 2020-05-15 DIAGNOSIS — E1169 Type 2 diabetes mellitus with other specified complication: Secondary | ICD-10-CM | POA: Diagnosis not present

## 2020-05-15 DIAGNOSIS — E785 Hyperlipidemia, unspecified: Secondary | ICD-10-CM | POA: Diagnosis not present

## 2020-05-22 DIAGNOSIS — F315 Bipolar disorder, current episode depressed, severe, with psychotic features: Secondary | ICD-10-CM | POA: Diagnosis not present

## 2020-06-14 DIAGNOSIS — E785 Hyperlipidemia, unspecified: Secondary | ICD-10-CM | POA: Diagnosis not present

## 2020-06-14 DIAGNOSIS — E1169 Type 2 diabetes mellitus with other specified complication: Secondary | ICD-10-CM | POA: Diagnosis not present

## 2020-06-14 DIAGNOSIS — I152 Hypertension secondary to endocrine disorders: Secondary | ICD-10-CM | POA: Diagnosis not present

## 2020-07-15 DIAGNOSIS — E785 Hyperlipidemia, unspecified: Secondary | ICD-10-CM | POA: Diagnosis not present

## 2020-07-15 DIAGNOSIS — E1169 Type 2 diabetes mellitus with other specified complication: Secondary | ICD-10-CM | POA: Diagnosis not present

## 2020-07-15 DIAGNOSIS — I152 Hypertension secondary to endocrine disorders: Secondary | ICD-10-CM | POA: Diagnosis not present

## 2020-07-22 DIAGNOSIS — F315 Bipolar disorder, current episode depressed, severe, with psychotic features: Secondary | ICD-10-CM | POA: Diagnosis not present

## 2020-07-24 DIAGNOSIS — E1169 Type 2 diabetes mellitus with other specified complication: Secondary | ICD-10-CM | POA: Diagnosis not present

## 2020-07-24 DIAGNOSIS — Z125 Encounter for screening for malignant neoplasm of prostate: Secondary | ICD-10-CM | POA: Diagnosis not present

## 2020-07-31 DIAGNOSIS — E1169 Type 2 diabetes mellitus with other specified complication: Secondary | ICD-10-CM | POA: Diagnosis not present

## 2020-07-31 DIAGNOSIS — E785 Hyperlipidemia, unspecified: Secondary | ICD-10-CM | POA: Diagnosis not present

## 2020-07-31 DIAGNOSIS — I152 Hypertension secondary to endocrine disorders: Secondary | ICD-10-CM | POA: Diagnosis not present

## 2020-07-31 DIAGNOSIS — Z23 Encounter for immunization: Secondary | ICD-10-CM | POA: Diagnosis not present

## 2020-07-31 DIAGNOSIS — E1159 Type 2 diabetes mellitus with other circulatory complications: Secondary | ICD-10-CM | POA: Diagnosis not present

## 2020-08-14 DIAGNOSIS — E1169 Type 2 diabetes mellitus with other specified complication: Secondary | ICD-10-CM | POA: Diagnosis not present

## 2020-08-14 DIAGNOSIS — N1831 Chronic kidney disease, stage 3a: Secondary | ICD-10-CM | POA: Diagnosis not present

## 2020-08-14 DIAGNOSIS — E785 Hyperlipidemia, unspecified: Secondary | ICD-10-CM | POA: Diagnosis not present

## 2020-08-14 DIAGNOSIS — I152 Hypertension secondary to endocrine disorders: Secondary | ICD-10-CM | POA: Diagnosis not present

## 2020-09-14 DIAGNOSIS — N1831 Chronic kidney disease, stage 3a: Secondary | ICD-10-CM | POA: Diagnosis not present

## 2020-09-14 DIAGNOSIS — I152 Hypertension secondary to endocrine disorders: Secondary | ICD-10-CM | POA: Diagnosis not present

## 2020-09-14 DIAGNOSIS — E1169 Type 2 diabetes mellitus with other specified complication: Secondary | ICD-10-CM | POA: Diagnosis not present

## 2020-09-14 DIAGNOSIS — E785 Hyperlipidemia, unspecified: Secondary | ICD-10-CM | POA: Diagnosis not present

## 2020-09-23 DIAGNOSIS — F315 Bipolar disorder, current episode depressed, severe, with psychotic features: Secondary | ICD-10-CM | POA: Diagnosis not present

## 2020-10-15 DIAGNOSIS — N1831 Chronic kidney disease, stage 3a: Secondary | ICD-10-CM | POA: Diagnosis not present

## 2020-10-15 DIAGNOSIS — E1169 Type 2 diabetes mellitus with other specified complication: Secondary | ICD-10-CM | POA: Diagnosis not present

## 2020-10-15 DIAGNOSIS — I152 Hypertension secondary to endocrine disorders: Secondary | ICD-10-CM | POA: Diagnosis not present

## 2020-10-15 DIAGNOSIS — E785 Hyperlipidemia, unspecified: Secondary | ICD-10-CM | POA: Diagnosis not present

## 2020-11-12 DIAGNOSIS — I152 Hypertension secondary to endocrine disorders: Secondary | ICD-10-CM | POA: Diagnosis not present

## 2020-11-12 DIAGNOSIS — E1169 Type 2 diabetes mellitus with other specified complication: Secondary | ICD-10-CM | POA: Diagnosis not present

## 2020-11-12 DIAGNOSIS — E785 Hyperlipidemia, unspecified: Secondary | ICD-10-CM | POA: Diagnosis not present

## 2020-11-12 DIAGNOSIS — N1831 Chronic kidney disease, stage 3a: Secondary | ICD-10-CM | POA: Diagnosis not present

## 2020-11-29 DIAGNOSIS — E1169 Type 2 diabetes mellitus with other specified complication: Secondary | ICD-10-CM | POA: Diagnosis not present

## 2020-12-06 DIAGNOSIS — E1159 Type 2 diabetes mellitus with other circulatory complications: Secondary | ICD-10-CM | POA: Diagnosis not present

## 2020-12-06 DIAGNOSIS — I152 Hypertension secondary to endocrine disorders: Secondary | ICD-10-CM | POA: Diagnosis not present

## 2020-12-06 DIAGNOSIS — E785 Hyperlipidemia, unspecified: Secondary | ICD-10-CM | POA: Diagnosis not present

## 2020-12-06 DIAGNOSIS — E1169 Type 2 diabetes mellitus with other specified complication: Secondary | ICD-10-CM | POA: Diagnosis not present

## 2020-12-13 DIAGNOSIS — E785 Hyperlipidemia, unspecified: Secondary | ICD-10-CM | POA: Diagnosis not present

## 2020-12-13 DIAGNOSIS — E1159 Type 2 diabetes mellitus with other circulatory complications: Secondary | ICD-10-CM | POA: Diagnosis not present

## 2020-12-13 DIAGNOSIS — E1169 Type 2 diabetes mellitus with other specified complication: Secondary | ICD-10-CM | POA: Diagnosis not present

## 2020-12-13 DIAGNOSIS — I152 Hypertension secondary to endocrine disorders: Secondary | ICD-10-CM | POA: Diagnosis not present

## 2020-12-23 DIAGNOSIS — F315 Bipolar disorder, current episode depressed, severe, with psychotic features: Secondary | ICD-10-CM | POA: Diagnosis not present

## 2021-01-12 DIAGNOSIS — E1149 Type 2 diabetes mellitus with other diabetic neurological complication: Secondary | ICD-10-CM | POA: Diagnosis not present

## 2021-01-12 DIAGNOSIS — F319 Bipolar disorder, unspecified: Secondary | ICD-10-CM | POA: Diagnosis not present

## 2021-01-12 DIAGNOSIS — E1169 Type 2 diabetes mellitus with other specified complication: Secondary | ICD-10-CM | POA: Diagnosis not present

## 2021-02-12 DIAGNOSIS — E785 Hyperlipidemia, unspecified: Secondary | ICD-10-CM | POA: Diagnosis not present

## 2021-02-12 DIAGNOSIS — E1149 Type 2 diabetes mellitus with other diabetic neurological complication: Secondary | ICD-10-CM | POA: Diagnosis not present

## 2021-02-12 DIAGNOSIS — F319 Bipolar disorder, unspecified: Secondary | ICD-10-CM | POA: Diagnosis not present

## 2021-02-24 DIAGNOSIS — F315 Bipolar disorder, current episode depressed, severe, with psychotic features: Secondary | ICD-10-CM | POA: Diagnosis not present

## 2021-03-14 DIAGNOSIS — F319 Bipolar disorder, unspecified: Secondary | ICD-10-CM | POA: Diagnosis not present

## 2021-03-14 DIAGNOSIS — E785 Hyperlipidemia, unspecified: Secondary | ICD-10-CM | POA: Diagnosis not present

## 2021-03-14 DIAGNOSIS — E1149 Type 2 diabetes mellitus with other diabetic neurological complication: Secondary | ICD-10-CM | POA: Diagnosis not present

## 2021-03-31 DIAGNOSIS — E1169 Type 2 diabetes mellitus with other specified complication: Secondary | ICD-10-CM | POA: Diagnosis not present

## 2021-04-07 DIAGNOSIS — I152 Hypertension secondary to endocrine disorders: Secondary | ICD-10-CM | POA: Diagnosis not present

## 2021-04-07 DIAGNOSIS — E1159 Type 2 diabetes mellitus with other circulatory complications: Secondary | ICD-10-CM | POA: Diagnosis not present

## 2021-04-07 DIAGNOSIS — Z136 Encounter for screening for cardiovascular disorders: Secondary | ICD-10-CM | POA: Diagnosis not present

## 2021-04-07 DIAGNOSIS — Z139 Encounter for screening, unspecified: Secondary | ICD-10-CM | POA: Diagnosis not present

## 2021-04-07 DIAGNOSIS — Z0000 Encounter for general adult medical examination without abnormal findings: Secondary | ICD-10-CM | POA: Diagnosis not present

## 2021-04-07 DIAGNOSIS — E785 Hyperlipidemia, unspecified: Secondary | ICD-10-CM | POA: Diagnosis not present

## 2021-04-07 DIAGNOSIS — E1169 Type 2 diabetes mellitus with other specified complication: Secondary | ICD-10-CM | POA: Diagnosis not present

## 2021-04-07 DIAGNOSIS — Z1331 Encounter for screening for depression: Secondary | ICD-10-CM | POA: Diagnosis not present

## 2021-04-14 DIAGNOSIS — E785 Hyperlipidemia, unspecified: Secondary | ICD-10-CM | POA: Diagnosis not present

## 2021-04-14 DIAGNOSIS — E1149 Type 2 diabetes mellitus with other diabetic neurological complication: Secondary | ICD-10-CM | POA: Diagnosis not present

## 2021-04-14 DIAGNOSIS — F319 Bipolar disorder, unspecified: Secondary | ICD-10-CM | POA: Diagnosis not present

## 2021-04-21 DIAGNOSIS — F315 Bipolar disorder, current episode depressed, severe, with psychotic features: Secondary | ICD-10-CM | POA: Diagnosis not present

## 2021-04-30 DIAGNOSIS — Z0000 Encounter for general adult medical examination without abnormal findings: Secondary | ICD-10-CM | POA: Diagnosis not present

## 2021-04-30 DIAGNOSIS — Z6833 Body mass index (BMI) 33.0-33.9, adult: Secondary | ICD-10-CM | POA: Diagnosis not present

## 2021-04-30 DIAGNOSIS — Z1211 Encounter for screening for malignant neoplasm of colon: Secondary | ICD-10-CM | POA: Diagnosis not present

## 2021-05-15 DIAGNOSIS — E1149 Type 2 diabetes mellitus with other diabetic neurological complication: Secondary | ICD-10-CM | POA: Diagnosis not present

## 2021-05-15 DIAGNOSIS — F319 Bipolar disorder, unspecified: Secondary | ICD-10-CM | POA: Diagnosis not present

## 2021-05-15 DIAGNOSIS — E785 Hyperlipidemia, unspecified: Secondary | ICD-10-CM | POA: Diagnosis not present

## 2021-06-14 DIAGNOSIS — E1149 Type 2 diabetes mellitus with other diabetic neurological complication: Secondary | ICD-10-CM | POA: Diagnosis not present

## 2021-06-14 DIAGNOSIS — E785 Hyperlipidemia, unspecified: Secondary | ICD-10-CM | POA: Diagnosis not present

## 2021-06-14 DIAGNOSIS — F319 Bipolar disorder, unspecified: Secondary | ICD-10-CM | POA: Diagnosis not present

## 2021-06-19 ENCOUNTER — Encounter: Payer: Self-pay | Admitting: Gastroenterology

## 2021-06-26 ENCOUNTER — Telehealth: Payer: Self-pay | Admitting: *Deleted

## 2021-06-26 ENCOUNTER — Encounter: Payer: Self-pay | Admitting: *Deleted

## 2021-06-26 NOTE — Telephone Encounter (Signed)
[facility] to know if he takes Eliquis AND if he is on Dialysis still- Lelan Pons PV

## 2021-06-26 NOTE — Telephone Encounter (Signed)
Inbound call from patient returning call. States he does not take Eliquis and is not on Dialysis

## 2021-07-07 DIAGNOSIS — F315 Bipolar disorder, current episode depressed, severe, with psychotic features: Secondary | ICD-10-CM | POA: Diagnosis not present

## 2021-07-14 ENCOUNTER — Encounter: Payer: Self-pay | Admitting: Gastroenterology

## 2021-07-14 ENCOUNTER — Ambulatory Visit (AMBULATORY_SURGERY_CENTER): Payer: Medicare HMO

## 2021-07-14 VITALS — Ht 74.0 in

## 2021-07-14 DIAGNOSIS — Z1211 Encounter for screening for malignant neoplasm of colon: Secondary | ICD-10-CM

## 2021-07-14 MED ORDER — NA SULFATE-K SULFATE-MG SULF 17.5-3.13-1.6 GM/177ML PO SOLN: 1.0000 | Freq: Once | ORAL | 0 refills | Status: AC

## 2021-07-14 NOTE — Progress Notes (Signed)
No egg or soy allergy known to patient  No issues known to pt with past sedation with any surgeries or procedures Patient denies ever being told they had issues or difficulty with intubation  No FH of Malignant Hyperthermia Pt is not on diet pills Pt is not on  home 02  Pt is not on blood thinners  Pt denies issues with constipation  No A fib or A flutter  Pt is fully vaccinated  for Covid   Virtual previsit  Instruct pt to call us by Thursday 11/3 if he has not rec'd his instructions in the mail yet.   NO PA's for preps discussed with pt In PV today  Discussed with pt there will be an out-of-pocket cost for prep and that varies from $0 to 70 +  dollars - pt verbalized understanding   Due to the COVID-19 pandemic we are asking patients to follow certain guidelines in PV and the [place]   Pt aware of COVID protocols and [facility] guidelines

## 2021-07-15 DIAGNOSIS — F319 Bipolar disorder, unspecified: Secondary | ICD-10-CM | POA: Diagnosis not present

## 2021-07-15 DIAGNOSIS — E1149 Type 2 diabetes mellitus with other diabetic neurological complication: Secondary | ICD-10-CM | POA: Diagnosis not present

## 2021-07-15 DIAGNOSIS — E785 Hyperlipidemia, unspecified: Secondary | ICD-10-CM | POA: Diagnosis not present

## 2021-07-22 ENCOUNTER — Ambulatory Visit (AMBULATORY_SURGERY_CENTER): Payer: Medicare HMO | Admitting: Gastroenterology

## 2021-07-22 ENCOUNTER — Encounter: Payer: Self-pay | Admitting: Gastroenterology

## 2021-07-22 VITALS — BP 122/78 | HR 57 | Temp 97.3°F | Resp 21 | Ht 74.0 in | Wt 255.0 lb

## 2021-07-22 DIAGNOSIS — F419 Anxiety disorder, unspecified: Secondary | ICD-10-CM | POA: Diagnosis not present

## 2021-07-22 DIAGNOSIS — D123 Benign neoplasm of transverse colon: Secondary | ICD-10-CM

## 2021-07-22 DIAGNOSIS — I10 Essential (primary) hypertension: Secondary | ICD-10-CM | POA: Diagnosis not present

## 2021-07-22 DIAGNOSIS — Z1211 Encounter for screening for malignant neoplasm of colon: Secondary | ICD-10-CM | POA: Diagnosis not present

## 2021-07-22 DIAGNOSIS — F319 Bipolar disorder, unspecified: Secondary | ICD-10-CM | POA: Diagnosis not present

## 2021-07-22 MED ORDER — SODIUM CHLORIDE 0.9 % IV SOLN: 500.0000 mL | Freq: Once | INTRAVENOUS | Status: DC

## 2021-07-22 NOTE — Progress Notes (Signed)
Called to room to assist during endoscopic procedure.  Patient ID and intended procedure confirmed with present staff. Received instructions for my participation in the procedure from the performing physician.  

## 2021-07-22 NOTE — Progress Notes (Signed)
GASTROENTEROLOGY PROCEDURE H&P NOTE   Primary Care Physician: Maryella Shivers, MD  HPI: James [name] is a 45 y.o. male who presents for Colonoscopy for screening.  Past Medical History:  Diagnosis Date   Anxiety    Bipolar 1 disorder ([place])    Cellulitis 08/24/2012   "RLE and spot on my right forearm" (08/24/2012)   COVID 12/2018   with kidney failure, on dialysis for one month and eliquis.   Hyperlipidemia    Hypertension    Type II diabetes mellitus ([place]) ~ 2009   "take Metformin" (08/24/2012)   Past Surgical History:  Procedure Laterality Date   AV FISTULA PLACEMENT Left 01/09/2019   Procedure: ARTERIOVENOUS (AV) FISTULA  ARM;  Surgeon: Rosetta Posner, MD;  Location: [facility];  Service: Vascular;  Laterality: Left;   [place]     "left; scaffold broke" 08/24/2012)   IR FLUORO GUIDE CV LINE LEFT  12/30/2018   IR FLUORO GUIDE CV LINE RIGHT  12/20/2018   IR FLUORO GUIDE CV LINE RIGHT  01/02/2019   IR US GUIDE VASC ACCESS LEFT  12/30/2018   IR US GUIDE VASC ACCESS RIGHT  12/20/2018   IR US GUIDE VASC ACCESS RIGHT  01/02/2019   OPEN ANTERIOR SHOULDER RECONSTRUCTION  ~ 2003   "right; kept dislocating; cut it open; sewed muscle back together; moved cartilage around and stapled that" (08/24/2012)   Current Outpatient Medications  Medication Sig Dispense Refill   amLODipine-benazepril (LOTREL) 10-20 MG capsule      atorvastatin (LIPITOR) 10 MG tablet      clonazePAM (KLONOPIN) 1 MG tablet Take by mouth 2 (two) times daily as needed.     fenofibrate (TRICOR) 145 MG tablet      FLUoxetine (PROZAC) 20 MG capsule      hydrOXYzine (VISTARIL) 50 MG capsule      loxapine (LOXITANE) 50 MG capsule Take 100 mg by mouth at bedtime.     omeprazole (PRILOSEC) 20 MG capsule Take 20 mg by mouth daily.     risperiDONE (RISPERDAL) 3 MG tablet Take 1 tablet (3 mg total) by mouth at bedtime. (Patient taking differently: Take 9 mg by mouth at bedtime.)  30 tablet 0   topiramate (TOPAMAX) 100 MG tablet Take 250 mg by mouth at bedtime.     traZODone (DESYREL) 100 MG tablet Take 1 tablet (100 mg total) by mouth at bedtime. (Patient taking differently: Take 300 mg by mouth at bedtime.) 30 tablet 0   amLODipine (NORVASC) 10 MG tablet Take 1 tablet (10 mg total) by mouth daily. (Patient not taking: Reported on 07/22/2021) 30 tablet 0   Darbepoetin Alfa (ARANESP) 100 MCG/0.5ML SOSY injection Inject 0.5 mLs (100 mcg total) into the vein every Thursday with hemodialysis. (Patient not taking: Reported on 07/22/2021) 4.2 mL    feeding supplement, ENSURE ENLIVE, (ENSURE ENLIVE) LIQD Take 237 mLs by mouth 2 (two) times daily between meals. 60 Bottle 0   FLUoxetine (PROZAC) 40 MG capsule Take 40 mg by mouth every morning.  (Patient not taking: Reported on 97/0/2637)     folic acid (FOLVITE) 1 MG tablet Take 1 tablet (1 mg total) by mouth daily. (Patient not taking: Reported on 07/22/2021) 30 tablet 0   loxapine (LOXITANE) 25 MG capsule Take 25 mg by mouth daily as needed. (Patient not taking: Reported on 07/22/2021)     thiamine 100 MG tablet Take 1 tablet (100 mg total) by mouth daily. (Patient not  taking: Reported on 07/22/2021) 30 tablet 0   topiramate (TOPAMAX) 200 MG tablet  (Patient not taking: Reported on 07/22/2021)     Current Facility-Administered Medications  Medication Dose Route Frequency Provider Last Rate Last Admin   0.9 %  sodium chloride infusion  500 mL Intravenous Once Mansouraty, Telford Nab., MD        Current Outpatient Medications:    amLODipine-benazepril (LOTREL) 10-20 MG capsule, , Disp: , Rfl:    atorvastatin (LIPITOR) 10 MG tablet, , Disp: , Rfl:    clonazePAM (KLONOPIN) 1 MG tablet, Take by mouth 2 (two) times daily as needed., Disp: , Rfl:    fenofibrate (TRICOR) 145 MG tablet, , Disp: , Rfl:    FLUoxetine (PROZAC) 20 MG capsule, , Disp: , Rfl:    hydrOXYzine (VISTARIL) 50 MG capsule, , Disp: , Rfl:    loxapine (LOXITANE) 50 MG  capsule, Take 100 mg by mouth at bedtime., Disp: , Rfl:    omeprazole (PRILOSEC) 20 MG capsule, Take 20 mg by mouth daily., Disp: , Rfl:    risperiDONE (RISPERDAL) 3 MG tablet, Take 1 tablet (3 mg total) by mouth at bedtime. (Patient taking differently: Take 9 mg by mouth at bedtime.), Disp: 30 tablet, Rfl: 0   topiramate (TOPAMAX) 100 MG tablet, Take 250 mg by mouth at bedtime., Disp: , Rfl:    traZODone (DESYREL) 100 MG tablet, Take 1 tablet (100 mg total) by mouth at bedtime. (Patient taking differently: Take 300 mg by mouth at bedtime.), Disp: 30 tablet, Rfl: 0   amLODipine (NORVASC) 10 MG tablet, Take 1 tablet (10 mg total) by mouth daily. (Patient not taking: Reported on 07/22/2021), Disp: 30 tablet, Rfl: 0   Darbepoetin Alfa (ARANESP) 100 MCG/0.5ML SOSY injection, Inject 0.5 mLs (100 mcg total) into the vein every Thursday with hemodialysis. (Patient not taking: Reported on 07/22/2021), Disp: 4.2 mL, Rfl:    feeding supplement, ENSURE ENLIVE, (ENSURE ENLIVE) LIQD, Take 237 mLs by mouth 2 (two) times daily between meals., Disp: 60 Bottle, Rfl: 0   FLUoxetine (PROZAC) 40 MG capsule, Take 40 mg by mouth every morning.  (Patient not taking: Reported on 07/22/2021), Disp: , Rfl:    folic acid (FOLVITE) 1 MG tablet, Take 1 tablet (1 mg total) by mouth daily. (Patient not taking: Reported on 07/22/2021), Disp: 30 tablet, Rfl: 0   loxapine (LOXITANE) 25 MG capsule, Take 25 mg by mouth daily as needed. (Patient not taking: Reported on 07/22/2021), Disp: , Rfl:    thiamine 100 MG tablet, Take 1 tablet (100 mg total) by mouth daily. (Patient not taking: Reported on 07/22/2021), Disp: 30 tablet, Rfl: 0   topiramate (TOPAMAX) 200 MG tablet, , Disp: , Rfl:   Current Facility-Administered Medications:    0.9 %  sodium chloride infusion, 500 mL, Intravenous, Once, Mansouraty, Telford Nab., MD No Known Allergies Family History  Problem Relation Age of Onset   Colon polyps Father    Diabetic kidney disease  Maternal Uncle    Colon cancer Neg Hx    Esophageal cancer Neg Hx    Rectal cancer Neg Hx    Stomach cancer Neg Hx    Social History   Socioeconomic History   Marital status: Single    Spouse name: Not on file   Number of children: Not on file   Years of education: Not on file   Highest education level: Not on file  Occupational History   Not on file  Tobacco Use   Smoking status:  Former    Packs/day: 0.02    Years: 25.00    Pack years: 0.50    Types: Cigarettes    Quit date: 05/2021    Years since quitting: 0.1   Smokeless tobacco: Former   Tobacco comments:    08/24/2012 "stopped both snuff and chew ~ 10 yr ago",         Light smoker occasional few times per week until quit  Vaping Use   Vaping Use: Every day   Start date: 09/14/2009   Substances: Nicotine, Flavoring  Substance and Sexual Activity   Alcohol use: Yes    Alcohol/week: 12.0 standard drinks    Types: 12 Cans of beer per week    Comment: not every week per pt 07/14/21   Drug use: Never   Sexual activity: Not Currently  Other Topics Concern   Not on file  Social History Narrative   Not on file   Social Determinants of Health   Financial Resource Strain: Not on file  Food Insecurity: Not on file  Transportation Needs: Not on file  Physical Activity: Not on file  Stress: Not on file  Social Connections: Not on file  Intimate Partner Violence: Not on file    Physical Exam: Today's Vitals   07/22/21 1055  BP: 112/64  Pulse: 74  Temp: (!) 97.3 F (36.3 C)  TempSrc: Temporal  SpO2: 98%  Weight: 255 lb (115.7 kg)  Height: 6\' 2"  (1.88 m)   Body mass index is 32.74 kg/m. GEN: NAD EYE: Sclerae anicteric ENT: MMM CV: Non-tachycardic GI: Soft, NT/ND NEURO:  Alert & Oriented x 3  Lab Results: No results for input(s): WBC, HGB, HCT, PLT in the last 72 hours. BMET No results for input(s): NA, K, CL, CO2, GLUCOSE, BUN, CREATININE, CALCIUM in the last 72 hours. LFT No results for input(s):  PROT, ALBUMIN, AST, ALT, ALKPHOS, BILITOT, BILIDIR, IBILI in the last 72 hours. PT/INR No results for input(s): LABPROT, INR in the last 72 hours.   Impression / Plan: This is a 45 y.o.male who presents for Colonoscopy for screening.  The risks and benefits of endoscopic evaluation/treatment were discussed with the patient and/or family; these include but are not limited to the risk of perforation, infection, bleeding, missed lesions, lack of diagnosis, severe illness requiring hospitalization, as well as anesthesia and sedation related illnesses.  The patient's history has been reviewed, patient examined, no change in status, and deemed stable for procedure.  The patient and/or family is agreeable to proceed.    Justice Britain, MD [facility] Office # [phone]

## 2021-07-22 NOTE — Patient Instructions (Signed)
Handouts were given to your care partner on polyps, diverticulosis, and a high fiber diet with liberal fluid intake. ?You may resume your current medications today. ?Await biopsy results.  May take 1-3 weeks to receive pathology results. ?Please call if any questions or concerns. ?  ? ? ? ?YOU HAD AN ENDOSCOPIC PROCEDURE TODAY AT THE [facility]:   Refer to the procedure report that was given to you for any specific questions about what was found during the examination.  If the procedure report does not answer your questions, please call your gastroenterologist to clarify.  If you requested that your care partner not be given the details of your procedure findings, then the procedure report has been included in a sealed envelope for you to review at your convenience later. ? ?YOU SHOULD EXPECT: Some feelings of bloating in the abdomen. Passage of more gas than usual.  Walking can help get rid of the air that was put into your GI tract during the procedure and reduce the bloating. If you had a lower endoscopy (such as a colonoscopy or flexible sigmoidoscopy) you may notice spotting of blood in your stool or on the toilet paper. If you underwent a bowel prep for your procedure, you may not have a normal bowel movement for a few days. ? ?Please Note:  You might notice some irritation and congestion in your nose or some drainage.  This is from the oxygen used during your procedure.  There is no need for concern and it should clear up in a day or so. ? ?SYMPTOMS TO REPORT IMMEDIATELY: ? ?Following lower endoscopy (colonoscopy or flexible sigmoidoscopy): ? Excessive amounts of blood in the stool ? Significant tenderness or worsening of abdominal pains ? Swelling of the abdomen that is new, acute ? Fever of 100?F or higher ? ? ?For urgent or emergent issues, a gastroenterologist can be reached at any hour by calling [phone]. ?Do not use MyChart messaging for urgent concerns.  ? ? ?DIET:  We do recommend  a small meal at first, but then you may proceed to your regular diet.  Drink plenty of fluids but you should avoid alcoholic beverages for 24 hours. ? ?ACTIVITY:  You should plan to take it easy for the rest of today and you should NOT DRIVE or use heavy machinery until tomorrow (because of the sedation medicines used during the test).   ? ?FOLLOW UP: ?Our staff will call the number listed on your records 48-72 hours following your procedure to check on you and address any questions or concerns that you may have regarding the information given to you following your procedure. If we do not reach you, we will leave a message.  We will attempt to reach you two times.  During this call, we will ask if you have developed any symptoms of COVID 19. If you develop any symptoms (ie: fever, flu-like symptoms, shortness of breath, cough etc.) before then, please call [phone].  If you test positive for Covid 19 in the 2 weeks post procedure, please call and report this information to us.   ? ?If any biopsies were taken you will be contacted by phone or by letter within the next 1-3 weeks.  Please call us at [phone] if you have not heard about the biopsies in 3 weeks.  ? ? ?SIGNATURES/CONFIDENTIALITY: ?You and/or your care partner have signed paperwork which will be entered into your electronic medical record.  These signatures attest to the fact that   information above on your After Visit Summary has been reviewed and is understood.  Full responsibility of the confidentiality of this discharge information lies with you and/or your care-partner.

## 2021-07-22 NOTE — Progress Notes (Signed)
Sedate, gd SR, tolerated procedure well, VSS, report to RN 

## 2021-07-22 NOTE — Progress Notes (Signed)
VS completed by DT.  Pt's states no medical or surgical changes since previsit or office visit.  

## 2021-07-22 NOTE — Op Note (Signed)
[place] Patient Name: James [name] Procedure Date: 07/22/2021 11:58 AM MRN: [id number] Endoscopist: Justice Britain , MD Age: 45 Referring MD:  Date of Birth: Feb 04, 1976 Gender: Male Account #: [id number] Procedure:                Colonoscopy Indications:              Screening for colorectal malignant neoplasm, This                            is the patient's first colonoscopy Medicines:                Monitored Anesthesia Care Procedure:                Pre-Anesthesia Assessment:                           - Prior to the procedure, a History and Physical                            was performed, and patient medications and                            allergies were reviewed. The patient's tolerance of                            previous anesthesia was also reviewed. The risks                            and benefits of the procedure and the sedation                            options and risks were discussed with the patient.                            All questions were answered, and informed consent                            was obtained. Prior Anticoagulants: The patient has                            taken no previous anticoagulant or antiplatelet                            agents. ASA Grade Assessment: III - A patient with                            severe systemic disease. After reviewing the risks                            and benefits, the patient was deemed in                            satisfactory condition to undergo the procedure.  After obtaining informed consent, the colonoscope                            was passed under direct vision. Throughout the                            procedure, the patient's blood pressure, pulse, and                            oxygen saturations were monitored continuously. The                            CF [id number] #[id number] was introduced through the anus                            and advanced  to the 5 cm into the ileum. The                            colonoscopy was performed without difficulty. The                            patient tolerated the procedure. The quality of the                            bowel preparation was adequate. The terminal ileum,                            ileocecal valve, appendiceal orifice, and rectum                            were photographed. Scope In: 12:04:15 PM Scope Out: 12:17:04 PM Scope Withdrawal Time: 0 hours 10 minutes 7 seconds  Total Procedure Duration: 0 hours 12 minutes 49 seconds  Findings:                 The digital rectal exam findings include                            hemorrhoids. Pertinent negatives include no                            palpable rectal lesions.                           The terminal ileum and ileocecal valve appeared                            normal.                           Two sessile polyps were found in the transverse                            colon and hepatic flexure. The polyps were 4 to 7  mm in size. These polyps were removed with a cold                            snare. Resection and retrieval were complete.                           Multiple small-mouthed diverticula were found in                            the recto-sigmoid colon and sigmoid colon.                           Normal mucosa was found in the entire colon                            otherwise.                           Non-bleeding non-thrombosed internal hemorrhoids                            were found during retroflexion, during perianal                            exam and during digital exam. The hemorrhoids were                            Grade II (internal hemorrhoids that prolapse but                            reduce spontaneously). Complications:            No immediate complications. Estimated Blood Loss:     Estimated blood loss was minimal. Impression:               - Hemorrhoids found on  digital rectal exam.                           - The examined portion of the ileum was normal.                           - Two 4 to 7 mm polyps in the transverse colon and                            at the hepatic flexure, removed with a cold snare.                            Resected and retrieved.                           - Diverticulosis in the recto-sigmoid colon and in                            the sigmoid colon.                           -  Normal mucosa in the entire examined colon                            otherwise.                           - Non-bleeding non-thrombosed internal hemorrhoids. Recommendation:           - The patient will be observed post-procedure,                            until all discharge criteria are met.                           - Discharge patient to home.                           - Patient has a contact number available for                            emergencies. The signs and symptoms of potential                            delayed complications were discussed with the                            patient. Return to normal activities tomorrow.                            Written discharge instructions were provided to the                            patient.                           - High fiber diet.                           - Use FiberCon 1-2 tablets PO daily.                           - Continue present medications.                           - Await pathology results.                           - Repeat colonoscopy in 01/18/09 years for                            surveillance based on pathology results and                            findings of adenomatous tissue.                           - The findings and recommendations were discussed  with the patient.                           - The findings and recommendations were discussed                            with the patient's family. Justice Britain, MD 07/22/2021  12:21:07 PM

## 2021-07-24 ENCOUNTER — Telehealth: Payer: Self-pay

## 2021-07-24 NOTE — Telephone Encounter (Signed)
  Follow up Call-  Call back number 07/22/2021  Post procedure Call Back phone  # [phone]  Permission to leave phone message Yes  Some recent data might be hidden     Patient questions:  Do you have a fever, pain , or abdominal swelling? No. Pain Score  0 *  Have you tolerated food without any problems? Yes.    Have you been able to return to your normal activities? Yes.    Do you have any questions about your discharge instructions: Diet   No. Medications  No. Follow up visit  No.  Do you have questions or concerns about your Care? No.  Actions: * If pain score is 4 or above: No action needed, pain <4.   Have you developed a fever since your procedure? no  2.   Have you had an respiratory symptoms (SOB or cough) since your procedure? no  3.   Have you tested positive for COVID 19 since your procedure no  4.   Have you had any family members/close contacts diagnosed with the COVID 19 since your procedure?  no   If yes to any of these questions please route to Joylene John, RN and Joella Prince, RN

## 2021-07-28 ENCOUNTER — Encounter: Payer: Self-pay | Admitting: Gastroenterology

## 2021-07-31 DIAGNOSIS — E1169 Type 2 diabetes mellitus with other specified complication: Secondary | ICD-10-CM | POA: Diagnosis not present

## 2021-08-11 DIAGNOSIS — E1159 Type 2 diabetes mellitus with other circulatory complications: Secondary | ICD-10-CM | POA: Diagnosis not present

## 2021-08-11 DIAGNOSIS — I10 Essential (primary) hypertension: Secondary | ICD-10-CM | POA: Diagnosis not present

## 2021-08-11 DIAGNOSIS — Z23 Encounter for immunization: Secondary | ICD-10-CM | POA: Diagnosis not present

## 2021-08-11 DIAGNOSIS — I152 Hypertension secondary to endocrine disorders: Secondary | ICD-10-CM | POA: Diagnosis not present

## 2021-08-11 DIAGNOSIS — E785 Hyperlipidemia, unspecified: Secondary | ICD-10-CM | POA: Diagnosis not present

## 2021-08-11 DIAGNOSIS — E1169 Type 2 diabetes mellitus with other specified complication: Secondary | ICD-10-CM | POA: Diagnosis not present

## 2021-08-14 DIAGNOSIS — F319 Bipolar disorder, unspecified: Secondary | ICD-10-CM | POA: Diagnosis not present

## 2021-08-14 DIAGNOSIS — E1149 Type 2 diabetes mellitus with other diabetic neurological complication: Secondary | ICD-10-CM | POA: Diagnosis not present

## 2021-09-14 DIAGNOSIS — E1149 Type 2 diabetes mellitus with other diabetic neurological complication: Secondary | ICD-10-CM | POA: Diagnosis not present

## 2021-09-14 DIAGNOSIS — F319 Bipolar disorder, unspecified: Secondary | ICD-10-CM | POA: Diagnosis not present

## 2021-09-14 DIAGNOSIS — E1159 Type 2 diabetes mellitus with other circulatory complications: Secondary | ICD-10-CM | POA: Diagnosis not present

## 2021-09-22 DIAGNOSIS — I152 Hypertension secondary to endocrine disorders: Secondary | ICD-10-CM | POA: Diagnosis not present

## 2021-09-22 DIAGNOSIS — E1159 Type 2 diabetes mellitus with other circulatory complications: Secondary | ICD-10-CM | POA: Diagnosis not present

## 2021-09-22 DIAGNOSIS — Z6832 Body mass index (BMI) 32.0-32.9, adult: Secondary | ICD-10-CM | POA: Diagnosis not present

## 2021-10-13 DIAGNOSIS — F315 Bipolar disorder, current episode depressed, severe, with psychotic features: Secondary | ICD-10-CM | POA: Diagnosis not present

## 2021-10-15 DIAGNOSIS — E1159 Type 2 diabetes mellitus with other circulatory complications: Secondary | ICD-10-CM | POA: Diagnosis not present

## 2021-10-15 DIAGNOSIS — E1149 Type 2 diabetes mellitus with other diabetic neurological complication: Secondary | ICD-10-CM | POA: Diagnosis not present

## 2021-10-15 DIAGNOSIS — F319 Bipolar disorder, unspecified: Secondary | ICD-10-CM | POA: Diagnosis not present

## 2021-11-04 DIAGNOSIS — R69 Illness, unspecified: Secondary | ICD-10-CM | POA: Diagnosis not present

## 2021-11-12 DIAGNOSIS — F319 Bipolar disorder, unspecified: Secondary | ICD-10-CM | POA: Diagnosis not present

## 2021-11-12 DIAGNOSIS — E1149 Type 2 diabetes mellitus with other diabetic neurological complication: Secondary | ICD-10-CM | POA: Diagnosis not present

## 2021-11-12 DIAGNOSIS — E1159 Type 2 diabetes mellitus with other circulatory complications: Secondary | ICD-10-CM | POA: Diagnosis not present

## 2021-12-09 DIAGNOSIS — F315 Bipolar disorder, current episode depressed, severe, with psychotic features: Secondary | ICD-10-CM | POA: Diagnosis not present

## 2021-12-13 DIAGNOSIS — E669 Obesity, unspecified: Secondary | ICD-10-CM | POA: Diagnosis not present

## 2021-12-13 DIAGNOSIS — E785 Hyperlipidemia, unspecified: Secondary | ICD-10-CM | POA: Diagnosis not present

## 2021-12-16 DIAGNOSIS — E1159 Type 2 diabetes mellitus with other circulatory complications: Secondary | ICD-10-CM | POA: Diagnosis not present

## 2021-12-16 DIAGNOSIS — E1169 Type 2 diabetes mellitus with other specified complication: Secondary | ICD-10-CM | POA: Diagnosis not present

## 2021-12-22 DIAGNOSIS — E785 Hyperlipidemia, unspecified: Secondary | ICD-10-CM | POA: Diagnosis not present

## 2021-12-22 DIAGNOSIS — N1831 Chronic kidney disease, stage 3a: Secondary | ICD-10-CM | POA: Diagnosis not present

## 2021-12-22 DIAGNOSIS — E1159 Type 2 diabetes mellitus with other circulatory complications: Secondary | ICD-10-CM | POA: Diagnosis not present

## 2021-12-22 DIAGNOSIS — I152 Hypertension secondary to endocrine disorders: Secondary | ICD-10-CM | POA: Diagnosis not present

## 2022-01-06 DIAGNOSIS — F315 Bipolar disorder, current episode depressed, severe, with psychotic features: Secondary | ICD-10-CM | POA: Diagnosis not present

## 2022-01-12 DIAGNOSIS — E785 Hyperlipidemia, unspecified: Secondary | ICD-10-CM | POA: Diagnosis not present

## 2022-01-12 DIAGNOSIS — E669 Obesity, unspecified: Secondary | ICD-10-CM | POA: Diagnosis not present

## 2022-02-12 DIAGNOSIS — E785 Hyperlipidemia, unspecified: Secondary | ICD-10-CM | POA: Diagnosis not present

## 2022-02-12 DIAGNOSIS — E669 Obesity, unspecified: Secondary | ICD-10-CM | POA: Diagnosis not present

## 2022-03-03 DIAGNOSIS — F315 Bipolar disorder, current episode depressed, severe, with psychotic features: Secondary | ICD-10-CM | POA: Diagnosis not present

## 2022-03-14 DIAGNOSIS — E785 Hyperlipidemia, unspecified: Secondary | ICD-10-CM | POA: Diagnosis not present

## 2022-03-14 DIAGNOSIS — E669 Obesity, unspecified: Secondary | ICD-10-CM | POA: Diagnosis not present

## 2022-03-31 DIAGNOSIS — F315 Bipolar disorder, current episode depressed, severe, with psychotic features: Secondary | ICD-10-CM | POA: Diagnosis not present

## 2022-04-14 DIAGNOSIS — E1169 Type 2 diabetes mellitus with other specified complication: Secondary | ICD-10-CM | POA: Diagnosis not present

## 2022-04-20 DIAGNOSIS — Z6833 Body mass index (BMI) 33.0-33.9, adult: Secondary | ICD-10-CM | POA: Diagnosis not present

## 2022-04-20 DIAGNOSIS — E785 Hyperlipidemia, unspecified: Secondary | ICD-10-CM | POA: Diagnosis not present

## 2022-04-20 DIAGNOSIS — E1169 Type 2 diabetes mellitus with other specified complication: Secondary | ICD-10-CM | POA: Diagnosis not present

## 2022-04-20 DIAGNOSIS — E1159 Type 2 diabetes mellitus with other circulatory complications: Secondary | ICD-10-CM | POA: Diagnosis not present

## 2022-04-20 DIAGNOSIS — I152 Hypertension secondary to endocrine disorders: Secondary | ICD-10-CM | POA: Diagnosis not present

## 2022-05-15 DIAGNOSIS — E785 Hyperlipidemia, unspecified: Secondary | ICD-10-CM | POA: Diagnosis not present

## 2022-05-15 DIAGNOSIS — E1159 Type 2 diabetes mellitus with other circulatory complications: Secondary | ICD-10-CM | POA: Diagnosis not present

## 2022-05-26 DIAGNOSIS — F315 Bipolar disorder, current episode depressed, severe, with psychotic features: Secondary | ICD-10-CM | POA: Diagnosis not present

## 2022-06-22 DIAGNOSIS — Z1331 Encounter for screening for depression: Secondary | ICD-10-CM | POA: Diagnosis not present

## 2022-06-22 DIAGNOSIS — Z6833 Body mass index (BMI) 33.0-33.9, adult: Secondary | ICD-10-CM | POA: Diagnosis not present

## 2022-06-22 DIAGNOSIS — E669 Obesity, unspecified: Secondary | ICD-10-CM | POA: Diagnosis not present

## 2022-06-22 DIAGNOSIS — Z139 Encounter for screening, unspecified: Secondary | ICD-10-CM | POA: Diagnosis not present

## 2022-06-22 DIAGNOSIS — Z1339 Encounter for screening examination for other mental health and behavioral disorders: Secondary | ICD-10-CM | POA: Diagnosis not present

## 2022-06-22 DIAGNOSIS — Z0000 Encounter for general adult medical examination without abnormal findings: Secondary | ICD-10-CM | POA: Diagnosis not present

## 2022-06-22 DIAGNOSIS — Z136 Encounter for screening for cardiovascular disorders: Secondary | ICD-10-CM | POA: Diagnosis not present

## 2022-07-08 DIAGNOSIS — Z23 Encounter for immunization: Secondary | ICD-10-CM | POA: Diagnosis not present

## 2022-07-08 DIAGNOSIS — F319 Bipolar disorder, unspecified: Secondary | ICD-10-CM | POA: Diagnosis not present

## 2022-07-08 DIAGNOSIS — Z6832 Body mass index (BMI) 32.0-32.9, adult: Secondary | ICD-10-CM | POA: Diagnosis not present

## 2022-07-08 DIAGNOSIS — R4189 Other symptoms and signs involving cognitive functions and awareness: Secondary | ICD-10-CM | POA: Diagnosis not present

## 2022-07-28 DIAGNOSIS — F315 Bipolar disorder, current episode depressed, severe, with psychotic features: Secondary | ICD-10-CM | POA: Diagnosis not present

## 2022-08-20 DIAGNOSIS — E1140 Type 2 diabetes mellitus with diabetic neuropathy, unspecified: Secondary | ICD-10-CM | POA: Diagnosis not present

## 2022-08-20 DIAGNOSIS — E1169 Type 2 diabetes mellitus with other specified complication: Secondary | ICD-10-CM | POA: Diagnosis not present

## 2022-08-20 DIAGNOSIS — E1159 Type 2 diabetes mellitus with other circulatory complications: Secondary | ICD-10-CM | POA: Diagnosis not present

## 2022-08-26 DIAGNOSIS — E7800 Pure hypercholesterolemia, unspecified: Secondary | ICD-10-CM | POA: Diagnosis not present

## 2022-08-26 DIAGNOSIS — Z7901 Long term (current) use of anticoagulants: Secondary | ICD-10-CM | POA: Diagnosis not present

## 2022-08-26 DIAGNOSIS — I10 Essential (primary) hypertension: Secondary | ICD-10-CM | POA: Diagnosis not present

## 2022-08-26 DIAGNOSIS — R9431 Abnormal electrocardiogram [ECG] [EKG]: Secondary | ICD-10-CM | POA: Diagnosis not present

## 2022-08-26 DIAGNOSIS — R0789 Other chest pain: Secondary | ICD-10-CM | POA: Diagnosis not present

## 2022-08-26 DIAGNOSIS — R079 Chest pain, unspecified: Secondary | ICD-10-CM | POA: Diagnosis not present

## 2022-08-26 DIAGNOSIS — F17210 Nicotine dependence, cigarettes, uncomplicated: Secondary | ICD-10-CM | POA: Diagnosis not present

## 2022-08-28 DIAGNOSIS — E1159 Type 2 diabetes mellitus with other circulatory complications: Secondary | ICD-10-CM | POA: Diagnosis not present

## 2022-08-28 DIAGNOSIS — I152 Hypertension secondary to endocrine disorders: Secondary | ICD-10-CM | POA: Diagnosis not present

## 2022-08-28 DIAGNOSIS — E785 Hyperlipidemia, unspecified: Secondary | ICD-10-CM | POA: Diagnosis not present

## 2022-08-28 DIAGNOSIS — Z1331 Encounter for screening for depression: Secondary | ICD-10-CM | POA: Diagnosis not present

## 2022-08-28 DIAGNOSIS — Z6833 Body mass index (BMI) 33.0-33.9, adult: Secondary | ICD-10-CM | POA: Diagnosis not present

## 2022-08-28 DIAGNOSIS — I10 Essential (primary) hypertension: Secondary | ICD-10-CM | POA: Diagnosis not present

## 2022-08-28 DIAGNOSIS — E1169 Type 2 diabetes mellitus with other specified complication: Secondary | ICD-10-CM | POA: Diagnosis not present

## 2022-09-22 DIAGNOSIS — L84 Corns and callosities: Secondary | ICD-10-CM | POA: Diagnosis not present

## 2022-09-22 DIAGNOSIS — E119 Type 2 diabetes mellitus without complications: Secondary | ICD-10-CM | POA: Diagnosis not present

## 2022-10-27 DIAGNOSIS — F315 Bipolar disorder, current episode depressed, severe, with psychotic features: Secondary | ICD-10-CM | POA: Diagnosis not present

## 2022-12-16 DIAGNOSIS — F315 Bipolar disorder, current episode depressed, severe, with psychotic features: Secondary | ICD-10-CM | POA: Diagnosis not present

## 2022-12-31 DIAGNOSIS — E1159 Type 2 diabetes mellitus with other circulatory complications: Secondary | ICD-10-CM | POA: Diagnosis not present

## 2022-12-31 DIAGNOSIS — E1169 Type 2 diabetes mellitus with other specified complication: Secondary | ICD-10-CM | POA: Diagnosis not present

## 2023-01-08 DIAGNOSIS — E1159 Type 2 diabetes mellitus with other circulatory complications: Secondary | ICD-10-CM | POA: Diagnosis not present

## 2023-01-08 DIAGNOSIS — E785 Hyperlipidemia, unspecified: Secondary | ICD-10-CM | POA: Diagnosis not present

## 2023-01-08 DIAGNOSIS — E1169 Type 2 diabetes mellitus with other specified complication: Secondary | ICD-10-CM | POA: Diagnosis not present

## 2023-01-08 DIAGNOSIS — E1140 Type 2 diabetes mellitus with diabetic neuropathy, unspecified: Secondary | ICD-10-CM | POA: Diagnosis not present

## 2023-01-08 DIAGNOSIS — I152 Hypertension secondary to endocrine disorders: Secondary | ICD-10-CM | POA: Diagnosis not present

## 2023-01-08 DIAGNOSIS — Z6834 Body mass index (BMI) 34.0-34.9, adult: Secondary | ICD-10-CM | POA: Diagnosis not present

## 2023-02-15 DIAGNOSIS — F315 Bipolar disorder, current episode depressed, severe, with psychotic features: Secondary | ICD-10-CM | POA: Diagnosis not present

## 2023-04-19 DIAGNOSIS — F315 Bipolar disorder, current episode depressed, severe, with psychotic features: Secondary | ICD-10-CM | POA: Diagnosis not present

## 2023-05-07 DIAGNOSIS — E1169 Type 2 diabetes mellitus with other specified complication: Secondary | ICD-10-CM | POA: Diagnosis not present

## 2023-05-07 DIAGNOSIS — E1159 Type 2 diabetes mellitus with other circulatory complications: Secondary | ICD-10-CM | POA: Diagnosis not present

## 2023-05-07 DIAGNOSIS — E1140 Type 2 diabetes mellitus with diabetic neuropathy, unspecified: Secondary | ICD-10-CM | POA: Diagnosis not present

## 2023-05-14 DIAGNOSIS — Z6832 Body mass index (BMI) 32.0-32.9, adult: Secondary | ICD-10-CM | POA: Diagnosis not present

## 2023-05-14 DIAGNOSIS — I152 Hypertension secondary to endocrine disorders: Secondary | ICD-10-CM | POA: Diagnosis not present

## 2023-05-14 DIAGNOSIS — E785 Hyperlipidemia, unspecified: Secondary | ICD-10-CM | POA: Diagnosis not present

## 2023-05-14 DIAGNOSIS — N1831 Chronic kidney disease, stage 3a: Secondary | ICD-10-CM | POA: Diagnosis not present

## 2023-05-14 DIAGNOSIS — E1159 Type 2 diabetes mellitus with other circulatory complications: Secondary | ICD-10-CM | POA: Diagnosis not present

## 2023-05-14 DIAGNOSIS — E1169 Type 2 diabetes mellitus with other specified complication: Secondary | ICD-10-CM | POA: Diagnosis not present

## 2023-05-14 DIAGNOSIS — F319 Bipolar disorder, unspecified: Secondary | ICD-10-CM | POA: Diagnosis not present

## 2023-05-18 DIAGNOSIS — R053 Chronic cough: Secondary | ICD-10-CM | POA: Diagnosis not present

## 2023-05-18 DIAGNOSIS — R059 Cough, unspecified: Secondary | ICD-10-CM | POA: Diagnosis not present

## 2023-05-24 DIAGNOSIS — E785 Hyperlipidemia, unspecified: Secondary | ICD-10-CM | POA: Diagnosis not present

## 2023-05-24 DIAGNOSIS — Z6832 Body mass index (BMI) 32.0-32.9, adult: Secondary | ICD-10-CM | POA: Diagnosis not present

## 2023-05-24 DIAGNOSIS — J449 Chronic obstructive pulmonary disease, unspecified: Secondary | ICD-10-CM | POA: Diagnosis not present

## 2023-05-24 DIAGNOSIS — E1169 Type 2 diabetes mellitus with other specified complication: Secondary | ICD-10-CM | POA: Diagnosis not present

## 2023-06-07 DIAGNOSIS — Z139 Encounter for screening, unspecified: Secondary | ICD-10-CM | POA: Diagnosis not present

## 2023-06-07 DIAGNOSIS — Z136 Encounter for screening for cardiovascular disorders: Secondary | ICD-10-CM | POA: Diagnosis not present

## 2023-06-07 DIAGNOSIS — J449 Chronic obstructive pulmonary disease, unspecified: Secondary | ICD-10-CM | POA: Diagnosis not present

## 2023-06-07 DIAGNOSIS — Z23 Encounter for immunization: Secondary | ICD-10-CM | POA: Diagnosis not present

## 2023-06-07 DIAGNOSIS — Z1331 Encounter for screening for depression: Secondary | ICD-10-CM | POA: Diagnosis not present

## 2023-06-07 DIAGNOSIS — Z1339 Encounter for screening examination for other mental health and behavioral disorders: Secondary | ICD-10-CM | POA: Diagnosis not present

## 2023-06-07 DIAGNOSIS — Z0000 Encounter for general adult medical examination without abnormal findings: Secondary | ICD-10-CM | POA: Diagnosis not present

## 2023-06-07 DIAGNOSIS — Z1389 Encounter for screening for other disorder: Secondary | ICD-10-CM | POA: Diagnosis not present

## 2023-06-07 DIAGNOSIS — Z6831 Body mass index (BMI) 31.0-31.9, adult: Secondary | ICD-10-CM | POA: Diagnosis not present

## 2023-06-22 DIAGNOSIS — F315 Bipolar disorder, current episode depressed, severe, with psychotic features: Secondary | ICD-10-CM | POA: Diagnosis not present

## 2023-09-10 DIAGNOSIS — E1169 Type 2 diabetes mellitus with other specified complication: Secondary | ICD-10-CM | POA: Diagnosis not present

## 2023-09-10 DIAGNOSIS — E1140 Type 2 diabetes mellitus with diabetic neuropathy, unspecified: Secondary | ICD-10-CM | POA: Diagnosis not present

## 2023-09-10 DIAGNOSIS — E1159 Type 2 diabetes mellitus with other circulatory complications: Secondary | ICD-10-CM | POA: Diagnosis not present

## 2023-09-17 DIAGNOSIS — R059 Cough, unspecified: Secondary | ICD-10-CM | POA: Diagnosis not present

## 2023-09-17 DIAGNOSIS — Z20822 Contact with and (suspected) exposure to covid-19: Secondary | ICD-10-CM | POA: Diagnosis not present

## 2023-09-17 DIAGNOSIS — Z6831 Body mass index (BMI) 31.0-31.9, adult: Secondary | ICD-10-CM | POA: Diagnosis not present

## 2023-09-17 DIAGNOSIS — Z1331 Encounter for screening for depression: Secondary | ICD-10-CM | POA: Diagnosis not present

## 2023-09-17 DIAGNOSIS — U071 COVID-19: Secondary | ICD-10-CM | POA: Diagnosis not present

## 2023-10-01 DIAGNOSIS — E1169 Type 2 diabetes mellitus with other specified complication: Secondary | ICD-10-CM | POA: Diagnosis not present

## 2023-10-01 DIAGNOSIS — E785 Hyperlipidemia, unspecified: Secondary | ICD-10-CM | POA: Diagnosis not present

## 2023-10-01 DIAGNOSIS — Z6832 Body mass index (BMI) 32.0-32.9, adult: Secondary | ICD-10-CM | POA: Diagnosis not present

## 2023-10-01 DIAGNOSIS — E1159 Type 2 diabetes mellitus with other circulatory complications: Secondary | ICD-10-CM | POA: Diagnosis not present

## 2023-10-01 DIAGNOSIS — E1140 Type 2 diabetes mellitus with diabetic neuropathy, unspecified: Secondary | ICD-10-CM | POA: Diagnosis not present

## 2023-10-01 DIAGNOSIS — I152 Hypertension secondary to endocrine disorders: Secondary | ICD-10-CM | POA: Diagnosis not present

## 2023-11-02 DIAGNOSIS — F315 Bipolar disorder, current episode depressed, severe, with psychotic features: Secondary | ICD-10-CM | POA: Diagnosis not present

## 2024-01-26 DIAGNOSIS — F315 Bipolar disorder, current episode depressed, severe, with psychotic features: Secondary | ICD-10-CM | POA: Diagnosis not present

## 2024-01-31 DIAGNOSIS — E1159 Type 2 diabetes mellitus with other circulatory complications: Secondary | ICD-10-CM | POA: Diagnosis not present

## 2024-01-31 DIAGNOSIS — E1169 Type 2 diabetes mellitus with other specified complication: Secondary | ICD-10-CM | POA: Diagnosis not present

## 2024-01-31 DIAGNOSIS — E1140 Type 2 diabetes mellitus with diabetic neuropathy, unspecified: Secondary | ICD-10-CM | POA: Diagnosis not present

## 2024-02-11 DIAGNOSIS — E1140 Type 2 diabetes mellitus with diabetic neuropathy, unspecified: Secondary | ICD-10-CM | POA: Diagnosis not present

## 2024-02-11 DIAGNOSIS — E1169 Type 2 diabetes mellitus with other specified complication: Secondary | ICD-10-CM | POA: Diagnosis not present

## 2024-02-11 DIAGNOSIS — E1159 Type 2 diabetes mellitus with other circulatory complications: Secondary | ICD-10-CM | POA: Diagnosis not present

## 2024-02-11 DIAGNOSIS — E785 Hyperlipidemia, unspecified: Secondary | ICD-10-CM | POA: Diagnosis not present

## 2024-02-11 DIAGNOSIS — I152 Hypertension secondary to endocrine disorders: Secondary | ICD-10-CM | POA: Diagnosis not present

## 2024-02-11 DIAGNOSIS — Z6831 Body mass index (BMI) 31.0-31.9, adult: Secondary | ICD-10-CM | POA: Diagnosis not present

## 2024-05-01 DIAGNOSIS — F315 Bipolar disorder, current episode depressed, severe, with psychotic features: Secondary | ICD-10-CM | POA: Diagnosis not present

## 2024-06-09 DIAGNOSIS — E1140 Type 2 diabetes mellitus with diabetic neuropathy, unspecified: Secondary | ICD-10-CM | POA: Diagnosis not present

## 2024-06-09 DIAGNOSIS — E1169 Type 2 diabetes mellitus with other specified complication: Secondary | ICD-10-CM | POA: Diagnosis not present

## 2024-06-09 DIAGNOSIS — Z125 Encounter for screening for malignant neoplasm of prostate: Secondary | ICD-10-CM | POA: Diagnosis not present

## 2024-06-09 DIAGNOSIS — E1159 Type 2 diabetes mellitus with other circulatory complications: Secondary | ICD-10-CM | POA: Diagnosis not present

## 2024-06-16 DIAGNOSIS — E1140 Type 2 diabetes mellitus with diabetic neuropathy, unspecified: Secondary | ICD-10-CM | POA: Diagnosis not present

## 2024-06-16 DIAGNOSIS — Z23 Encounter for immunization: Secondary | ICD-10-CM | POA: Diagnosis not present

## 2024-06-16 DIAGNOSIS — E1159 Type 2 diabetes mellitus with other circulatory complications: Secondary | ICD-10-CM | POA: Diagnosis not present

## 2024-06-16 DIAGNOSIS — E785 Hyperlipidemia, unspecified: Secondary | ICD-10-CM | POA: Diagnosis not present

## 2024-06-16 DIAGNOSIS — I152 Hypertension secondary to endocrine disorders: Secondary | ICD-10-CM | POA: Diagnosis not present

## 2024-06-16 DIAGNOSIS — Z6832 Body mass index (BMI) 32.0-32.9, adult: Secondary | ICD-10-CM | POA: Diagnosis not present

## 2024-06-16 DIAGNOSIS — E1169 Type 2 diabetes mellitus with other specified complication: Secondary | ICD-10-CM | POA: Diagnosis not present

## 2024-07-31 DIAGNOSIS — Z0000 Encounter for general adult medical examination without abnormal findings: Secondary | ICD-10-CM | POA: Diagnosis not present

## 2024-07-31 DIAGNOSIS — Z6833 Body mass index (BMI) 33.0-33.9, adult: Secondary | ICD-10-CM | POA: Diagnosis not present

## 2024-07-31 DIAGNOSIS — Z122 Encounter for screening for malignant neoplasm of respiratory organs: Secondary | ICD-10-CM | POA: Diagnosis not present

## 2024-08-01 ENCOUNTER — Other Ambulatory Visit (HOSPITAL_BASED_OUTPATIENT_CLINIC_OR_DEPARTMENT_OTHER): Payer: Self-pay | Admitting: Family Medicine

## 2024-08-01 DIAGNOSIS — Z87891 Personal history of nicotine dependence: Secondary | ICD-10-CM

## 2024-08-04 ENCOUNTER — Ambulatory Visit (HOSPITAL_BASED_OUTPATIENT_CLINIC_OR_DEPARTMENT_OTHER)
Admission: RE | Admit: 2024-08-04 | Discharge: 2024-08-04 | Disposition: A | Discharge status: Home / Self Care | Source: Ambulatory Visit | Attending: Family Medicine | Admitting: Family Medicine

## 2024-08-04 DIAGNOSIS — Z87891 Personal history of nicotine dependence: Secondary | ICD-10-CM | POA: Diagnosis not present

## 2024-08-04 DIAGNOSIS — Z122 Encounter for screening for malignant neoplasm of respiratory organs: Secondary | ICD-10-CM
